# Patient Record
Sex: Female | Born: 1940
Health system: Southern US, Community
[De-identification: ages and names within clinical notes are randomized; demographics above are authoritative.]

## PROBLEM LIST (undated history)

## (undated) ENCOUNTER — Emergency Department (HOSPITAL_COMMUNITY): Admission: EM | Payer: Medicare Other | Source: Home / Self Care

## (undated) DIAGNOSIS — F329 Major depressive disorder, single episode, unspecified: Secondary | ICD-10-CM

## (undated) DIAGNOSIS — I10 Essential (primary) hypertension: Secondary | ICD-10-CM

## (undated) DIAGNOSIS — G4700 Insomnia, unspecified: Secondary | ICD-10-CM

## (undated) DIAGNOSIS — Z923 Personal history of irradiation: Secondary | ICD-10-CM

## (undated) DIAGNOSIS — E119 Type 2 diabetes mellitus without complications: Secondary | ICD-10-CM

## (undated) DIAGNOSIS — E785 Hyperlipidemia, unspecified: Secondary | ICD-10-CM

## (undated) DIAGNOSIS — K219 Gastro-esophageal reflux disease without esophagitis: Secondary | ICD-10-CM

## (undated) DIAGNOSIS — R519 Headache, unspecified: Secondary | ICD-10-CM

## (undated) DIAGNOSIS — I495 Sick sinus syndrome: Secondary | ICD-10-CM

## (undated) DIAGNOSIS — R55 Syncope and collapse: Secondary | ICD-10-CM

## (undated) DIAGNOSIS — Z950 Presence of cardiac pacemaker: Secondary | ICD-10-CM

## (undated) DIAGNOSIS — H18519 Endothelial corneal dystrophy, unspecified eye: Secondary | ICD-10-CM

## (undated) DIAGNOSIS — C50919 Malignant neoplasm of unspecified site of unspecified female breast: Secondary | ICD-10-CM

## (undated) DIAGNOSIS — G245 Blepharospasm: Secondary | ICD-10-CM

## (undated) DIAGNOSIS — Z720 Tobacco use: Secondary | ICD-10-CM

## (undated) DIAGNOSIS — D329 Benign neoplasm of meninges, unspecified: Secondary | ICD-10-CM

## (undated) DIAGNOSIS — H1851 Endothelial corneal dystrophy: Secondary | ICD-10-CM

## (undated) DIAGNOSIS — I619 Nontraumatic intracerebral hemorrhage, unspecified: Secondary | ICD-10-CM

## (undated) DIAGNOSIS — R0989 Other specified symptoms and signs involving the circulatory and respiratory systems: Secondary | ICD-10-CM

## (undated) DIAGNOSIS — F32A Depression, unspecified: Secondary | ICD-10-CM

## (undated) DIAGNOSIS — J449 Chronic obstructive pulmonary disease, unspecified: Secondary | ICD-10-CM

## (undated) DIAGNOSIS — F419 Anxiety disorder, unspecified: Secondary | ICD-10-CM

## (undated) HISTORY — PX: CHOLECYSTECTOMY: SHX55

## (undated) HISTORY — DX: Insomnia, unspecified: G47.00

## (undated) HISTORY — PX: PARTIAL HYSTERECTOMY: SHX80

## (undated) HISTORY — DX: Type 2 diabetes mellitus without complications: E11.9

## (undated) HISTORY — DX: Major depressive disorder, single episode, unspecified: F32.9

## (undated) HISTORY — PX: BREAST LUMPECTOMY: SHX2

## (undated) HISTORY — DX: Anxiety disorder, unspecified: F41.9

## (undated) HISTORY — DX: Other specified symptoms and signs involving the circulatory and respiratory systems: R09.89

## (undated) HISTORY — DX: Syncope and collapse: R55

## (undated) HISTORY — DX: Malignant neoplasm of unspecified site of unspecified female breast: C50.919

## (undated) HISTORY — DX: Chronic obstructive pulmonary disease, unspecified: J44.9

## (undated) HISTORY — DX: Depression, unspecified: F32.A

## (undated) HISTORY — DX: Gastro-esophageal reflux disease without esophagitis: K21.9

## (undated) HISTORY — DX: Hyperlipidemia, unspecified: E78.5

## (undated) HISTORY — DX: Tobacco use: Z72.0

## (undated) HISTORY — DX: Essential (primary) hypertension: I10

## (undated) HISTORY — DX: Blepharospasm: G24.5

## (undated) HISTORY — DX: Sick sinus syndrome: I49.5

## (undated) HISTORY — DX: Nontraumatic intracerebral hemorrhage, unspecified: I61.9

---

## 1985-11-13 DIAGNOSIS — C50919 Malignant neoplasm of unspecified site of unspecified female breast: Secondary | ICD-10-CM

## 1985-11-13 HISTORY — DX: Malignant neoplasm of unspecified site of unspecified female breast: C50.919

## 1995-05-03 LAB — HM COLONOSCOPY

## 2000-08-06 ENCOUNTER — Encounter: Admission: RE | Admit: 2000-08-06 | Discharge: 2000-08-06 | Payer: Self-pay | Admitting: Family Medicine

## 2000-08-06 ENCOUNTER — Encounter: Payer: Self-pay | Admitting: Family Medicine

## 2000-10-15 ENCOUNTER — Encounter: Payer: Self-pay | Admitting: Oncology

## 2000-10-15 ENCOUNTER — Encounter: Admission: RE | Admit: 2000-10-15 | Discharge: 2000-10-15 | Payer: Self-pay | Admitting: Oncology

## 2001-11-13 DIAGNOSIS — Z982 Presence of cerebrospinal fluid drainage device: Secondary | ICD-10-CM

## 2001-11-13 DIAGNOSIS — I619 Nontraumatic intracerebral hemorrhage, unspecified: Secondary | ICD-10-CM

## 2001-11-13 HISTORY — PX: ANEURYSM COILING: SHX5349

## 2001-11-13 HISTORY — DX: Nontraumatic intracerebral hemorrhage, unspecified: I61.9

## 2001-11-13 HISTORY — PX: VENTRICULOPERITONEAL SHUNT: SHX204

## 2001-11-13 HISTORY — DX: Presence of cerebrospinal fluid drainage device: Z98.2

## 2001-11-14 ENCOUNTER — Encounter: Payer: Self-pay | Admitting: Oncology

## 2001-11-14 ENCOUNTER — Encounter: Admission: RE | Admit: 2001-11-14 | Discharge: 2001-11-14 | Payer: Self-pay | Admitting: Oncology

## 2001-11-19 ENCOUNTER — Encounter: Admission: RE | Admit: 2001-11-19 | Discharge: 2001-11-19 | Payer: Self-pay | Admitting: Oncology

## 2001-11-19 ENCOUNTER — Encounter: Payer: Self-pay | Admitting: Oncology

## 2002-06-01 ENCOUNTER — Emergency Department (HOSPITAL_COMMUNITY): Admission: EM | Admit: 2002-06-01 | Discharge: 2002-06-01 | Payer: Self-pay | Admitting: Emergency Medicine

## 2002-06-01 ENCOUNTER — Inpatient Hospital Stay (HOSPITAL_COMMUNITY): Admission: AD | Admit: 2002-06-01 | Discharge: 2002-06-27 | Payer: Self-pay | Admitting: Neurosurgery

## 2002-06-01 DIAGNOSIS — I609 Nontraumatic subarachnoid hemorrhage, unspecified: Secondary | ICD-10-CM | POA: Insufficient documentation

## 2002-06-01 DIAGNOSIS — Z8679 Personal history of other diseases of the circulatory system: Secondary | ICD-10-CM

## 2002-06-02 ENCOUNTER — Encounter: Payer: Self-pay | Admitting: Neurosurgery

## 2002-06-04 ENCOUNTER — Encounter: Payer: Self-pay | Admitting: Critical Care Medicine

## 2002-06-05 ENCOUNTER — Encounter: Payer: Self-pay | Admitting: Critical Care Medicine

## 2002-06-08 ENCOUNTER — Encounter: Payer: Self-pay | Admitting: Pulmonary Disease

## 2002-06-09 ENCOUNTER — Encounter: Payer: Self-pay | Admitting: Neurosurgery

## 2002-06-09 ENCOUNTER — Encounter: Payer: Self-pay | Admitting: Pulmonary Disease

## 2002-06-10 ENCOUNTER — Encounter: Payer: Self-pay | Admitting: Pulmonary Disease

## 2002-06-10 ENCOUNTER — Encounter: Payer: Self-pay | Admitting: Neurosurgery

## 2002-06-12 ENCOUNTER — Encounter: Payer: Self-pay | Admitting: Pulmonary Disease

## 2002-06-12 ENCOUNTER — Encounter: Payer: Self-pay | Admitting: Neurosurgery

## 2002-06-17 ENCOUNTER — Encounter: Payer: Self-pay | Admitting: Neurosurgery

## 2002-06-27 ENCOUNTER — Inpatient Hospital Stay (HOSPITAL_COMMUNITY)
Admission: RE | Admit: 2002-06-27 | Discharge: 2002-07-10 | Payer: Self-pay | Admitting: Physical Medicine & Rehabilitation

## 2002-07-02 ENCOUNTER — Encounter: Payer: Self-pay | Admitting: Physical Medicine & Rehabilitation

## 2002-07-22 ENCOUNTER — Encounter
Admission: RE | Admit: 2002-07-22 | Discharge: 2002-07-22 | Payer: Self-pay | Admitting: Physical Medicine & Rehabilitation

## 2002-09-02 ENCOUNTER — Ambulatory Visit: Admission: RE | Admit: 2002-09-02 | Discharge: 2002-09-02 | Payer: Self-pay | Admitting: Interventional Radiology

## 2002-09-16 ENCOUNTER — Encounter: Payer: Self-pay | Admitting: Neurosurgery

## 2002-09-16 ENCOUNTER — Encounter: Admission: RE | Admit: 2002-09-16 | Discharge: 2002-09-16 | Payer: Self-pay | Admitting: Neurosurgery

## 2002-09-29 ENCOUNTER — Encounter: Payer: Self-pay | Admitting: Family Medicine

## 2002-09-29 ENCOUNTER — Encounter: Admission: RE | Admit: 2002-09-29 | Discharge: 2002-09-29 | Payer: Self-pay | Admitting: Family Medicine

## 2002-12-04 ENCOUNTER — Encounter: Payer: Self-pay | Admitting: Oncology

## 2002-12-04 ENCOUNTER — Encounter: Admission: RE | Admit: 2002-12-04 | Discharge: 2002-12-04 | Payer: Self-pay | Admitting: Oncology

## 2003-02-06 ENCOUNTER — Ambulatory Visit (HOSPITAL_COMMUNITY): Admission: RE | Admit: 2003-02-06 | Discharge: 2003-02-06 | Payer: Self-pay | Admitting: Interventional Radiology

## 2003-07-13 ENCOUNTER — Ambulatory Visit (HOSPITAL_COMMUNITY): Admission: RE | Admit: 2003-07-13 | Discharge: 2003-07-13 | Payer: Self-pay | Admitting: Interventional Radiology

## 2003-10-21 ENCOUNTER — Ambulatory Visit (HOSPITAL_COMMUNITY): Admission: RE | Admit: 2003-10-21 | Discharge: 2003-10-21 | Payer: Self-pay | Admitting: Interventional Radiology

## 2003-10-29 ENCOUNTER — Ambulatory Visit (HOSPITAL_COMMUNITY): Admission: RE | Admit: 2003-10-29 | Discharge: 2003-10-29 | Payer: Self-pay | Admitting: Interventional Radiology

## 2003-11-23 ENCOUNTER — Ambulatory Visit (HOSPITAL_COMMUNITY): Admission: RE | Admit: 2003-11-23 | Discharge: 2003-11-23 | Payer: Self-pay | Admitting: Interventional Radiology

## 2003-12-09 ENCOUNTER — Inpatient Hospital Stay (HOSPITAL_COMMUNITY): Admission: AD | Admit: 2003-12-09 | Discharge: 2003-12-10 | Payer: Self-pay | Admitting: Interventional Radiology

## 2004-01-21 ENCOUNTER — Encounter: Admission: RE | Admit: 2004-01-21 | Discharge: 2004-01-21 | Payer: Self-pay | Admitting: Oncology

## 2004-03-07 ENCOUNTER — Ambulatory Visit (HOSPITAL_COMMUNITY): Admission: RE | Admit: 2004-03-07 | Discharge: 2004-03-07 | Payer: Self-pay | Admitting: Interventional Radiology

## 2004-05-28 ENCOUNTER — Emergency Department (HOSPITAL_COMMUNITY): Admission: EM | Admit: 2004-05-28 | Discharge: 2004-05-28 | Payer: Self-pay | Admitting: *Deleted

## 2004-07-15 ENCOUNTER — Ambulatory Visit (HOSPITAL_COMMUNITY): Admission: RE | Admit: 2004-07-15 | Discharge: 2004-07-15 | Payer: Self-pay | Admitting: Interventional Radiology

## 2005-02-17 ENCOUNTER — Ambulatory Visit (HOSPITAL_COMMUNITY): Admission: RE | Admit: 2005-02-17 | Discharge: 2005-02-17 | Payer: Self-pay | Admitting: Interventional Radiology

## 2005-02-28 ENCOUNTER — Encounter: Admission: RE | Admit: 2005-02-28 | Discharge: 2005-02-28 | Payer: Self-pay | Admitting: Family Medicine

## 2005-04-07 ENCOUNTER — Ambulatory Visit (HOSPITAL_COMMUNITY): Admission: RE | Admit: 2005-04-07 | Discharge: 2005-04-07 | Payer: Self-pay | Admitting: Family Medicine

## 2005-11-03 ENCOUNTER — Ambulatory Visit (HOSPITAL_COMMUNITY): Admission: RE | Admit: 2005-11-03 | Discharge: 2005-11-03 | Payer: Self-pay | Admitting: Interventional Radiology

## 2006-02-27 ENCOUNTER — Ambulatory Visit (HOSPITAL_COMMUNITY): Admission: RE | Admit: 2006-02-27 | Discharge: 2006-02-27 | Payer: Self-pay | Admitting: General Surgery

## 2006-02-27 ENCOUNTER — Encounter (INDEPENDENT_AMBULATORY_CARE_PROVIDER_SITE_OTHER): Payer: Self-pay | Admitting: Specialist

## 2006-06-04 ENCOUNTER — Ambulatory Visit (HOSPITAL_COMMUNITY): Admission: RE | Admit: 2006-06-04 | Discharge: 2006-06-04 | Payer: Self-pay | Admitting: Interventional Radiology

## 2006-06-21 ENCOUNTER — Encounter: Admission: RE | Admit: 2006-06-21 | Discharge: 2006-06-21 | Payer: Self-pay | Admitting: Family Medicine

## 2006-07-12 ENCOUNTER — Observation Stay (HOSPITAL_COMMUNITY): Admission: AD | Admit: 2006-07-12 | Discharge: 2006-07-13 | Payer: Self-pay | Admitting: *Deleted

## 2006-07-12 HISTORY — PX: PACEMAKER INSERTION: SHX728

## 2006-08-05 ENCOUNTER — Emergency Department (HOSPITAL_COMMUNITY): Admission: EM | Admit: 2006-08-05 | Discharge: 2006-08-05 | Payer: Self-pay | Admitting: Emergency Medicine

## 2007-05-27 ENCOUNTER — Ambulatory Visit (HOSPITAL_COMMUNITY): Admission: RE | Admit: 2007-05-27 | Discharge: 2007-05-27 | Payer: Self-pay | Admitting: Neurosurgery

## 2007-07-05 ENCOUNTER — Encounter: Admission: RE | Admit: 2007-07-05 | Discharge: 2007-07-05 | Payer: Self-pay | Admitting: Family Medicine

## 2008-07-06 ENCOUNTER — Encounter: Admission: RE | Admit: 2008-07-06 | Discharge: 2008-07-06 | Payer: Self-pay | Admitting: *Deleted

## 2009-09-30 ENCOUNTER — Encounter: Admission: RE | Admit: 2009-09-30 | Discharge: 2009-09-30 | Payer: Self-pay | Admitting: Internal Medicine

## 2010-12-03 ENCOUNTER — Encounter: Payer: Self-pay | Admitting: Interventional Radiology

## 2010-12-03 ENCOUNTER — Encounter: Payer: Self-pay | Admitting: Neurosurgery

## 2010-12-04 ENCOUNTER — Encounter: Payer: Self-pay | Admitting: Family Medicine

## 2010-12-04 ENCOUNTER — Encounter: Payer: Self-pay | Admitting: *Deleted

## 2011-03-31 NOTE — Op Note (Signed)
Mary Higgins, HODGKINS            ACCOUNT NO.:  [id number]   MEDICAL RECORD NO.:  [id number]          PATIENT TYPE:  AMB   LOCATION:  DAY                          FACILITY:  [facility]   PHYSICIAN:  Timothy E. Earlene Plater, M.D. DATE OF BIRTH:  01-14-1941   DATE OF PROCEDURE:  02/27/2006  DATE OF DISCHARGE:                                 OPERATIVE REPORT   PREOPERATIVE DIAGNOSIS:  Mass, cyst, midback.   POSTOPERATIVE DIAGNOSIS:  Lipoma, midback.   OPERATIVE PROCEDURE:  Excision and drainage, mass.   SURGEON:  Timothy E. Earlene Plater, M.D.   ANESTHESIA:  Local standby.   [name] is 37, otherwise fairly healthy.  Medications and medical  situations known and understood.  She presented in the office February 22  with a mass that for all practical purposes was a hematoma of her midback.  She had fallen and struck a piece of furniture at home.  Also notable, she  has been worked up for dizzy spells and falling.  We did not treat that,  thinking it would resolve.  She came back to the office on March 26 and 50  mL of old blood was removed.  Again, the swelling and pain recurred and she  is insistent about having a complete excision, so we have arranged that.  She was seen and identified, the permit signed, evaluated by anesthesia.   She was taken to the operating room, where she transferred herself to the  table and positioned herself comfortably on her left side down.  Then IV  sedation was given.  The mass was identified just off the right midline of  the low midback.  I used 0.25% Marcaine and 1% Xylocaine for local  anesthesia.  I made a 1 cm incision dependently, thinking that fluid would  flow, and, in fact, there was no fluid.  I probed the wound cavity, and it  appeared to be fatty tissue.  I put in additional local and then made an  incision over the middle part of that mass and, indeed, it was a diffuse  lipoma.  I debrided, suctioned and removed all of the lipomatous material.  There  was no capsule.  Bleeding was controlled with the cautery.  I did  place a 19 mm Blake drain and brought it out through the dependent incision,  sutured it to the skin, cut it to length, and then closed the wound with  layers of 3-0 Monocryl.  She tolerated it well, counts correct.  Also to  note, cultures of the wound were made because of this long history.   She is given Vicodin for pain and will be seen in follow-up in the office.      Timothy E. Earlene Plater, M.D.  Electronically Signed    TED/MEDQ  D:  02/27/2006  T:  02/28/2006  Job:  [id number]   cc:   Knox Royalty, M.D.  [facility]

## 2011-03-31 NOTE — Op Note (Signed)
NAMEJORI, FRERICHS            ACCOUNT NO.:  [id number]   MEDICAL RECORD NO.:  [id number]          PATIENT TYPE:  OBV   LOCATION:  6529                         FACILITY:  [facility]   PHYSICIAN:  Darlin Priestly, MD  DATE OF BIRTH:  1941/05/17   DATE OF PROCEDURE:  07/12/2006  DATE OF DISCHARGE:                                 OPERATIVE REPORT   PROCEDURE:  Insertion of a St. Jude Victory XL-DR generator with passive  atrial and ventricular leads.   ATTENDING PHYSICIAN:  Darlin Priestly, MD   COMPLICATIONS:  None.   INDICATIONS:  The patient is a 70 year old female with a history of  diabetes, hyperlipidemia, tobacco use, COPD, history of recurrent syncope,  history of cerebral aneurysm, recently underwent intracerebral coiling by  Dr. Grandville Silos. Deveshwar, M.D. in 2003 as well as a placement of a  ventriculoperitoneal shunt by Dr. Dorian Heckle.  She underwent a tilt  table test this morning for possible workup of near cardiogenic syncope  which was positive dropping her blood pressure into the 60s and a heart rate  into the 20s.  She is now referred for dual chamber pacer implant secondary  to recurrent syncope and pronounced bradycardia during tilt-table testing.   PROCEDURE IN DETAIL:  After informed consent, the patient was brought to the  cardiac catheterization level and the level of the right chest was prepped  and draped in a sterile fashion.  EKG monitoring established.  Lidocaine 1%  was used to anesthetize the right mid subclavicular area.  Next, an  approximately 3 cm horizontal incision was carried out in the mid clavicle  and hemostasis was obtained with electrocautery.  Blunt dissection was used  to carry this down to the right pectoral fascia.  Next, an approximately 3 x  4 cm pocket was created over the right pectoral fascia and good hemostasis  obtained with electrocautery.  The right subclavian vein was then entered  using fluoro and contrast guidance and  guidewire was then easily passed into  the SVC and right atrium.  Following this, a #9 [place] dilator and sheath  __________ guidewire and guidewire was then removed.  Next, a St. Jude  passive lead, Model #[id number], 52 cm, Serial [id number] was then passed into the  right atrium.  Guidewire was retained and peel-away sheath was removed.  A 7-  French sheath was then inserted over the retained guidewire and the  guidewire and dilator were removed.  Following this, a 46 cm St. Jude  passive lead, Model [id number] was then passed into the right atrium and peel-  away sheath removed.   A J-curve was then placed in the ventricular lead stylette and the  ventricular lead was then allowed to prolapse the tricuspid valve and  positioned in the RV apex without difficulty.  Threshold was then  determined.  R-waves were measured at 14.5 millivolts.  Impedance was 1013  ohms.  Thresholds were 0.5 volts at 0.5 milliseconds.  The current was 0.3  milliamps.  The atrial lead was then allowed to form a J and positioned in  the right atrial  appendage.  Threshold was then determined.  P-waves were  measured at 5.7 millivolts, impedance 604 ohms.  Threshold in the atrium 0.4  volts at 0.5 milliseconds.  Current was 0.7 milliamps.  Both of these were  negative for diaphragmatic stimulation at 10 volts.   Two silk sutures were then placed at the base of each lead and each lead was  then secured into place with two silk sutures per lead.  The pocket was then  copiously irrigated with 1% Kenmycin solution.  Again hemostasis was  confirmed.  The leads were then connected in serial fashion to a St. Jude  [employer] generator, Model [id number], Serial [id number].  Head screws were  tightened and pacing was confirmed.  A single silk suture was then placed in  the apex of the pocket.  The generator leads were then delivered into the  pocket and the __________ was secured with a silk suture.  The subcutaneous  layer was closed  using running 2-0 Vicryl.  The skin layers were then closed  using 4-0 Vicryl.  Steri-Strips were then applied.  The patient returned to  the recovery room in stable condition.   CONCLUSION:  Successful implant of a St. Jude Victory XL-DR generator, Model  [id number], Serial [id number] with passive atrial and ventricular St. Jude leads.      Darlin Priestly, MD  Electronically Signed     RHM/MEDQ  D:  07/12/2006  T:  07/12/2006  Job:  [id number]   cc:   Gerlene Burdock A. Alanda Amass, M.D.  Armstead Peaks, M.D.  Danae Orleans. Venetia Maxon, M.D.  Sanjeev K. Corliss Skains, M.D.

## 2011-03-31 NOTE — Consult Note (Signed)
NAMECYTHNIA, Mary Higgins            ACCOUNT NO.:  [id number]   MEDICAL RECORD NO.:  [id number]          PATIENT TYPE:  OUT   LOCATION:  SDS                          FACILITY:  [facility]   PHYSICIAN:  Sanjeev K. Deveshwar, M.D.DATE OF BIRTH:  1941-07-26   DATE OF CONSULTATION:  11/03/2205  DATE OF DISCHARGE:                                   CONSULTATION   BRIEF HISTORY:  This is a very pleasant 70 year old female who was admitted  to [facility] by Dr. Venetia Maxon on July of 2003 with a subarachnoid  hemorrhage. The patient had a right frontal ventricular catheter placed on  September June 01, 2002. She also underwent coiling of the aneurysm by Dr.  Corliss Skains during that admission. The patient has been followed on a regular  basis with angiograms. Her most recent angiogram was performed in April of  2006. At that time an aneurysm appeared to be stable without evidence of  recannulization or coil compaction.   The patient was seen today by Dr. Corliss Skains for a recent history of falling  and headaches.   PAST MEDICAL HISTORY:  1.  Significant for diabetes mellitus.  2.  Hyperlipidemia.  3.  Breast CA.  4.  History of tobacco use.  5.  History of a 50% left internal carotid artery aneurysm at the time of      her last angiogram.  6.  She also has a history of anxiety and depression.   ALLERGIES:  DAYPRO (causes a rash).   PAST SURGICAL HISTORY:  The patient is status post cholecystectomy, status  post lumpectomy for breast CA, status post partial hysterectomy.   FAMILY HISTORY:  Her mother died at age 70 from cancer. She has two brothers  alive and well. Her father died at age 96 from natural causes.   SOCIAL HISTORY:  The patient is separated. She has three children. She lives  in [place]. She has a history of tobacco use. She does not use alcohol.  She is on disability.   IMPRESSION:  As noted, the patient is seen in consult today by Dr. Corliss Skains  secondary to recent  falls. At one point she did hit her head on concrete and  required several staples. She had an MRI that did not show any acute  changes. The patient's daughter has witnessed some of these falls. The  patient denies loss of consciousness. She denies bowel or bladder  incontinence. Following one episode she had some mild confusion. She notes  that she occasionally staggers when walking but on some occasions does not  fall.   Dr. Corliss Skains is concerned that possibly her shunt is not working. He  ordered a shunt series to be performed today the results of which are  currently pending. She may need further follow up with Dr. Venetia Maxon as well.  Greater than 30 minutes was spent on this consult today.      Delton See, P.A.    ______________________________  Grandville Silos. Corliss Skains, M.D.    DR/MEDQ  D:  11/03/2005  T:  11/03/2005  Job:  [id number]   cc:   Teena Irani.  Arlyce Dice, M.D.  Fax: [phone]   Danae Orleans. Venetia Maxon, M.D.  Fax: [phone]

## 2011-03-31 NOTE — Discharge Summary (Signed)
Mary Higgins, Mary Higgins            ACCOUNT NO.:  [id number]   MEDICAL RECORD NO.:  [id number]          PATIENT TYPE:  OBV   LOCATION:  6529                         FACILITY:  [facility]   PHYSICIAN:  Richard A. Alanda Amass, M.D.DATE OF BIRTH:  10/05/1941   DATE OF ADMISSION:  07/12/2006  DATE OF DISCHARGE:  07/13/2006                                 DISCHARGE SUMMARY   DISCHARGE DIAGNOSIS:  1. Neurocardiogenic syncope status post pacemaker implant with a St. Jude      device this admission by Dr. Jenne Campus.  2. History of cerebral aneurysm status post recent intracerebral coiling      by Dr. Corliss Skains as well as a shunt placement by Dr. Venetia Maxon in the past.  3. Dyslipidemia.  4. Chronic obstructive pulmonary disease.  5. Non-insulin-dependent diabetes.   HOSPITAL COURSE:  The patient is a 70 year old female with the above medical  history who has had recurrent syncope.  She underwent a tilt table test on  July 12, 2006, which was positive with hypotension and bradycardia with a  heart rate down in the 20s.  She underwent implantation of St. Jude Victory  generator with atrial and ventricular leads.  She tolerated this well.  Dr.  Alanda Amass feels she can be discharged July 13, 2006.  She will follow up  with Dr. Alanda Amass as an outpatient.  It should be noted her chest x-ray at  discharge was abnormal in that there was an increased area of density over  the medial right upper, this could have been a shadow.  Follow-up chest x-  ray or CT is recommended as an outpatient.  There is also a density in the  left lower lobe which could be atelectasis.  CT of her head was done this  admission also which revealed a stable mild ventricular dilatation status  post ventriculoperitoneal shunt with no acute intracranial findings.  Skull  x-ray a showed ventriculoperitoneal shunt and was intact with no fractures.   LABORATORY DATA:  Sodium 133, potassium 3.9, BUN 10, creatinine 0.6.   DISCHARGE  MEDICATIONS:  Toprol XL 12.5 mg a day, metformin 1 gram a day,  Lexapro 10 mg a day, Lipitor 20 mg a day, aspirin 81 mg a day, Ambien CR and  clonazepam as needed.   DISPOSITION:  The patient is discharged in stable condition.  She will  follow up with Dr. Alanda Amass next week for a wound check.  We will need to  follow up with chest x-ray and she may need a chest CT to follow up the  above noted abnormalities.     Abelino Derrick, P.A.      Richard A. Alanda Amass, M.D.  Electronically Signed   LKK/MEDQ  D:  07/13/2006  T:  07/13/2006  Job:  [id number]

## 2011-03-31 NOTE — Cardiovascular Report (Signed)
Mary Higgins, Mary Higgins            ACCOUNT NO.:  [id number]   MEDICAL RECORD NO.:  [id number]          PATIENT TYPE:  OBV   LOCATION:  6529                         FACILITY:  [facility]   PHYSICIAN:  Richard A. Alanda Amass, M.D.DATE OF BIRTH:  Mar 03, 1941   DATE OF PROCEDURE:  07/12/2006  DATE OF DISCHARGE:                              CARDIAC CATHETERIZATION   PROCEDURE:  Tilt table test.   BRIEF HISTORY:  Mary Higgins is a very pleasant 70 year old white separated  mother of 3.  She smokes 4-5 cigarettes a day, was a slightly heavier smoker  in the past.  She was initially sent to our office for evaluation of a small  PFO found on 2-D echo.  History revealed a one year history of recurrent  syncopal episodes that would happen with no warning and no recollection of  the fall, itself.  After the episode, she fell weak, there were no  palpitations.  She occasionally experienced some diaphoresis and nausea. No  vertigo and no headaches.  She walks with a cane due to staggering as the  family felt this might prevent further falls.  Historically, she has had  transient loss of consciousness with these and she has fallen backward  several times.   She has a remote history of cerebral aneurysm and subarachnoid hemorrhage.  The aneurysm was repaired with coiling by Dr. Corliss Skains in July 2003 and  apparently has been stable postoperatively on follow-up angiograms.  At the  time of her [facility], she had a ventriculoperitoneal shunt placed by Dr. Maeola Harman.  She has not been back to follow-up with him.  I have discussed this  with Dr. Venetia Maxon.  He did not feel that this would be the cause of her syncope  and he recommended a CT, MR shunt study which will be done today.  Recent  diagnosis of AODM, history of hyperlipidemia, remote history of breast  cancer status post lumpectomy, and 50% LICA on remote angiogram,  asymptomatic.   Echocardiogram showed moderate DUST with no outflow obstruction, normal  systolic function and EF, mild diastolic relaxation abnormality, mild left  atrial enlargement.  Very small PFO.   EKG showed sinus rhythm and was essentially within normal limits.   Preoperative laboratory showed normal CBC and diff, coags, TSH, BMP.   The tilt table test was ordered in this setting with strong clinical  suspicion of neurocardiogenic syncope.   The patient underwent upright tilt table testing in the postabsorptive state  this a.m. of July 12, 2006.  After approximately 29 minutes, systolic  blood pressure dropped to 90 then transiently to 75 and to 80 over the next  several minutes.  The patient was asymptomatic at that time.   The patient had continued low blood pressures over the next ten minutes  without symptoms from 57-78.  She then developed fairly profound bradycardia  in the 20s with sinus bradycardia.  The 70% tilt was immediately abandoned  and the patient was laid flat.  When I came in, the IVs were not open but I  opened those up.  The patient had some transient diaphoresis which  resolved  and some anxiety but was quite comfortable and her blood pressure promptly  responded with fluids over the next 5-10 minutes to 126/70 and a heart rate  of 70.  12-lead EKG is pending.   The upright tilt table test is positive for vasodepressor response and  profound hypotension with presyncope.  There was no loss of consciousness or  syncope during this test.   Further workup is warranted as it appears that this patient has a component  of both bradycardia and vasodepression that may account for or at least be  seriously contributing to her recurrent syncopal episodes.  After her MRI,  CT, and shunt studies are done and no significant abnormality, we would  recommend permanent pacemaker implantation in this setting.      Richard A. Alanda Amass, M.D.  Electronically Signed     RAW/MEDQ  D:  07/12/2006  T:  07/12/2006  Job:  [id number]   cc:   Sanjeev K.  Corliss Skains, M.D.  Armstead Peaks, M.D.  Danae Orleans. Venetia Maxon, M.D.

## 2011-03-31 NOTE — Consult Note (Signed)
[place]  Patient:    Mary Higgins, Mary Higgins Visit Number: [id number] MRN: [id number]          Service Type: EMS Location: ED Attending Physician:  Tobey Bride Dictated by:   Danae Orleans Venetia Maxon, M.D. Proc. Date: 06/01/02 Admit Date:  06/01/2002 Discharge Date: 06/01/2002                            Consultation Report  REASON FOR CONSULTATION:  Subarachnoid hemorrhage.  HISTORY OF ILLNESS:  The patient is a 70 year old right-handed woman with the most severe headache of her life approximately 2 p.m. today associated with nausea and vomiting.  She went to the [facility] Emergency Room where a head CT was obtained which showed subarachnoid hemorrhage with interhemispheric blood, a small amount of intraventricular blood with incipient hydrocephalus; she was transferred to [facility] for further care per Dr. Hilario Quarry.  PAST MEDICAL HISTORY:  Past medical history is significant for breast cancer and depression.  She is status post prior left breast lumpectomy with lymph node dissection and radiation therapy.  She is followed by Dr. Raymond Gurney C. Magrinat and has been cleared for breast cancer recurrence; she has been under his care for the last six years.  ALLERGIES:  No known drug allergies.  MEDICATIONS: 1. Klonopin 1 mg t.i.d. 2. Wellbutrin 150 mg daily.  PHYSICAL EXAMINATION:  VITAL SIGNS:  The patient has a temperature of 97.5, pulse of 70, respiratory rate of 12, blood pressure of 150/70.  NEUROLOGIC:  She is somnolent but arousable.  She has photophobia, mild meningismus.  Her pupils are 2 mm and reactive.  Extraocular movements are intact.  Face is intact and symmetric.  Hearing is intact to finger rub. Palate is upgoing.  Shoulder shrug is symmetric.  Tongue protrudes in the midline.  She has no pronator drift.  She has 5/5 upper and lower extremity strength in all motor groups, bilaterally symmetric.  She has  intact sensation to light touch in her upper and lower extremities.  Deep tendon reflexes are 2 in the biceps, triceps and brachioradialis, 2 at the knees and 2 at the ankles.  Toes are downgoing to plantar stimulation.  IMPRESSION:  The patient is a 70 year old woman with a history of breast cancer and depression, with severe sudden onset of headache this afternoon at approximately 2 p.m.  Her head CT is consistent with a subarachnoid hemorrhage secondary to anterior communicating artery aneurysm; this would be grade 2 on S scale.  RECOMMENDATIONS:  Cerebral angiography to rule out aneurysm with endovascular versus surgical obliteration, depending on the aneurysm anatomy.  She is to have neuro checks every hour, blood pressure to be controlled.  She is to be observed for hydrocephalus with possible intraventricular catheter placement. She is placed on subarachnoid precautions. Dictated by:   Danae Orleans Venetia Maxon, M.D. Attending Physician:  Tobey Bride DD:  06/01/02 TD:  06/05/02 Job: [id number]

## 2011-03-31 NOTE — Op Note (Signed)
[place]  Patient:    Mary Higgins, Mary Higgins Visit Number: [id number] MRN: [id number]          Service Type: EMS Location: ED Attending Physician:  Tobey Bride Dictated by:   Danae Orleans Venetia Maxon, M.D. Admit Date:  06/01/2002 Discharge Date: 06/01/2002                             Operative Report  PREOPERATIVE DIAGNOSIS:  Subarachnoid hemorrhage, hydrocephalus, and anterior communicating artery aneurysm.  POSTOPERATIVE DIAGNOSIS:  Subarachnoid hemorrhage, hydrocephalus, and anterior communicating artery aneurysm.  PROCEDURE:  Right frontal ventricular catheter placement.  SURGEON:  Danae Orleans. Venetia Maxon, M.D.  ANESTHESIA:  Local lidocaine with IV sedation.  COMPLICATIONS:  None.  DISPOSITION:  To neuro-ICU.  INDICATIONS:  The patient is a 70 year old woman with ruptured anterior communicating artery aneurysm with had endovascular obliteration of the aneurysm, but had progressive hydrocephalus on repeat CT scan.  It was therefore elected to place an intraventricular catheter.  DESCRIPTION OF PROCEDURE:  With the patient in the neuro-ICU, she was on a ventilator, and given intravenous sedation with morphine and Versed.  Her right frontal scalp was then prepped and draped in the usual sterile fashion. The planned incision was infiltrated with local lidocaine with 1:200,000 epinephrine.  A stab incision was made in the right frontal region just anterior to the coronal suture and 2 cm off the midline.  Using the twist drill, a small bur hole was created.  The dura was then incised with a spinal needle.  The ventricular catheter was passed with good brisk return of CSF at a depth of 7 cm which was under pressure.  The catheter was then tunneled through subgaleally through a separate stab exit wound using a trocar.  It was then anchored in position.  The incisions were sutured with nylon stitches. The drain was then hooked up.  The wound was dressed  with Benzoin gauze and Op-Site.  The patient tolerated the procedure well. Dictated by:   Danae Orleans Venetia Maxon, M.D. Attending Physician:  Tobey Bride DD:  06/02/02 TD:  06/04/02 Job: [id number]

## 2011-03-31 NOTE — Consult Note (Signed)
[facility]  Patient:    Mary Higgins, Mary Higgins Visit Number: [id number] MRN: [id number]          Service Type: EMS Location: ED Attending Physician:  Tobey Bride Dictated by:   Charlcie Cradle Delford Field, M.D. [facility] Proc. Date: 06/04/02 Admit Date:  06/01/2002 Discharge Date: 06/01/2002   CC:         Jomarie Longs D. Venetia Maxon, M.D.   Consultation Report  CHIEF COMPLAINT:  Vasospasm, altered mental status, need for central line.  HISTORY OF PRESENT ILLNESS:  This is a 70 year old white female who had a sudden onset of headache admitted on June 01, 2002 with an anterior communicating artery aneurysm with grade 4 subarachnoid hemorrhage.  She work up coiling of the aneurysm on June 01, 2002 by interventional radiology.  Has an IVC drain in place.  Postoperatively the patient did well until today when she developed increased agitation, alterations in consciousness, increased nausea, some emesis.  Transcranial Dopplers show increased velocity compatible with vasospasm.  We are asked to place a central line and help with [facility] therapy and other ICU issues.  PAST MEDICAL HISTORY:  History of depression.  Breast cancer, status post mastectomy on the left.  No known lung disease.  ALLERGIES:  None.  CURRENT MEDICATIONS:  Klonopin, Wellbutrin, Dilantin, Nimotop, Ancef.  FAMILY HISTORY:  Noncontributory.  SOCIAL HISTORY:  Noncontributory.  REVIEW OF SYSTEMS:  Noncontributory.  PHYSICAL EXAMINATION  VITAL SIGNS:  Temperature 100.8, blood pressure 155/70, pulse 85, respirations 13, saturation 95% on 2 L.  CHEST:  Clear.  CARDIAC:  Regular rate and rhythm with S3.  ABDOMEN:  Soft, nontender.  Bowel sounds active.  EXTREMITIES:  No edema or clubbing.  NEUROLOGIC:  The patient is lethargic, but will awaken to commands.  Moves the right side more than the left side.  HEENT:  No jugular venous distention, lymphadenopathy.  Oropharynx clear.  LABORATORY DATA:   Sodium 143, potassium 3.8, chloride 112, CO2 26, BUN 3, creatinine 0.7, calcium 8.4.  White count 8.6, hemoglobin 10.8.  On 2 L pH 7.37, pCO2 43, pO2 114.  Chest x-ray is pending.  EKG:  Normal sinus rhythm.  Normal ECG.  IMPRESSION AND RECOMMENDATION:  Subarachnoid hemorrhage grade IV with anterior communicating artery aneurysm status post coiling now with early vasospasm and alterations in consciousness.  Will place central line, give intravenous fluid bolus, keep systolic pressure greater or equal to 160 and CVP greater or equal to 10.  Monitor CVP.  Monitor gas exchange, chest x-ray, laboratory work.  No need for mechanical ventilation as of yet. Dictated by:   Charlcie Cradle Delford Field, M.D. [facility] Attending Physician:  Tobey Bride DD:  06/04/02 TD:  06/08/02 Job: [id number]

## 2011-03-31 NOTE — Op Note (Signed)
NAME:  Mary Higgins, PICKERAL                      ACCOUNT NO.:  [id number]   MEDICAL RECORD NO.:  [id number]                   PATIENT TYPE:  INP   LOCATION:  3106                                 FACILITY:  [facility]   PHYSICIAN:  Danae Orleans. Venetia Maxon, M.D.               DATE OF BIRTH:  1941/04/28   DATE OF PROCEDURE:  06/24/2002  DATE OF DISCHARGE:                                 OPERATIVE REPORT   PREOPERATIVE DIAGNOSES:  Hydrocephalus secondary to subarachnoid hemorrhage  secondary to anterior communicating artery aneurysm rupture.   POSTOPERATIVE DIAGNOSES:  Hydrocephalus secondary to subarachnoid hemorrhage  secondary to anterior communicating artery aneurysm rupture.   PROCEDURE:  Creation of ventriculoperitoneal shunt (1.5 level Delta).   SURGEON:  Danae Orleans. Venetia Maxon, M.D.   ASSISTANT:  Stefani Dama, M.D.   ANESTHESIA:  General endotracheal anesthesia.   ESTIMATED BLOOD LOSS:  Minimal.   COMPLICATIONS:  None.   DISPOSITION:  To recovery.   INDICATIONS:  The patient is a 70 year old woman with a ventricular drain  for three weeks, status post subarachnoid hemorrhage secondary to anterior  communicating artery aneurysm.  It is not possible to wean and remove the  ventricular catheter, and it is elected to place a ventriculoperitoneal  shunt.   DESCRIPTION OF PROCEDURE:  The patient was brought to the operating room.  Following the satisfactory and uncomplicated induction of general  endotracheal anesthesia and placement of intravenous lines, the patient was  placed in the supine position on the operating table.  The left side of her  scalp was then shaved, prepped, and draped in the usual sterile fashion  along with her left upper chest and abdomen.  The area of planned incision  was infiltrated with 0.25% Marcaine and 0.5% lidocaine and 1:200,000  epinephrine.  A curvilinear incision was made over the scalp of the left  frontal region, and the bur hole was created.   Subsequently the dura was  incised with electrocautery and the ventricular catheter was passed into the  left frontal horn of the ventricle.  Prior to doing this, an abdominal  incision was made and the peritoneal cavity was entered.  A shunt passer was  then used to create a subcutaneous tunnel to the abdomen.  A 1.5 snap-  assembly Delta shunt was then assembled and passed into position.  The 6 cm  snap-assembly ventricular catheter was inserted and assembled too with the  shunt after there was excellent spontaneous flow of CSF after the initial  time.  The entire assembly was then hooked together, and there was  spontaneous flow of CSF through the shunt system.  The peritoneal catheter  was then inserted into the abdomen and that incision was closed with  interrupted Vicryl suture.  The postauricular and cranial incisions were  then closed  with Vicryl stitches.  Sterile occlusive dressings were placed.  The right  frontoventricular catheter was then subsequently removed and a  stitch was  placed at its exit point.  The patient appeared to tolerate the procedure  well, was taken to recovery in stable and satisfactory condition.  All  counts were correct at the end of the procedure.                                               Danae Orleans. Venetia Maxon, M.D.    JDS/MEDQ  D:  06/24/2002  T:  06/26/2002  Job:  [id number]

## 2011-03-31 NOTE — Discharge Summary (Signed)
   NAME:  Mary Higgins, Mary Higgins                      ACCOUNT NO.:  [id number]   MEDICAL RECORD NO.:  [id number]                   PATIENT TYPE:  INP   LOCATION:  3106                                 FACILITY:  [facility]   PHYSICIAN:  Danae Orleans. Venetia Maxon, M.D.               DATE OF BIRTH:  Dec 06, 1940   DATE OF ADMISSION:  06/01/2002  DATE OF DISCHARGE:  06/27/2002                                 DISCHARGE SUMMARY   REASON FOR ADMISSION:  Subarachnoid hemorrhage, obstructive hydrocephalus,  hypo-osmolality, paralytic ileus, history of breast malignancy, history of  depression, hypopotassemia, transient cerebral ischemia.   HISTORY OF ILLNESS AND HOSPITAL COURSE:  The patient was admitted to the  hospital through the emergency room on June 01, 2002 after the most severe  headache of her life associated with nausea and vomiting.  Head CT was  positive for subarachnoid hemorrhage with interhemispheric blood and a small  amount of intraventricular hemorrhage.  The patient was admitted to the  hospital with subarachnoid hemorrhage precautions.  She had a four-vessel  cerebral angiogram and was found to have an anterior communicating artery  aneurysm.  The patient had endovascular obliteration of this aneurysm.  She  also had a ventricular drain placed for obstructive hydrocephalus.  She did  well with the aneurysm treatment and continued on with ventricular drainage.  She was seen by critical care medicine service and was kept hydrated with  evidence of vasospasm.  The patient had followup transcranial Dopplers.  She  subsequently required intraventricular peritoneal shunt placement and this  was done on June 24, 2002.  She did well with this after the shunt  placement, was doing well and was subsequently transferred to the inpatient  rehabilitation service for further rehabilitation in the hospital.  This was  done on June 27, 2002.                                               Danae Orleans. Venetia Maxon,  M.D.    JDS/MEDQ  D:  10/17/2002  T:  10/18/2002  Job:  [id number]

## 2011-03-31 NOTE — Discharge Summary (Signed)
NAME:  Mary, Higgins                      ACCOUNT NO.:  [id number]   MEDICAL RECORD NO.:  [id number]                   PATIENT TYPE:  IPS   LOCATION:  4006                                 FACILITY:  [facility]   PHYSICIAN:  Ranelle Oyster, M.D.             DATE OF BIRTH:  1940/11/20   DATE OF ADMISSION:  DATE OF DISCHARGE:  07/10/2002                                 DISCHARGE SUMMARY   DISCHARGE DIAGNOSES:  1. Subarachnoid hemorrhage secondary to anterior communicating artery     aneurysm.   1. Diabetes mellitus, diet-controlled.   1. History of depression.   1. Post hemorrhagic anemia.   HISTORY OF PRESENT ILLNESS:  Mary Higgins is a 70 year old female  with a history of breast cancer, depression, admitted to [facility]  on 07/20 with severe headache.  X-rays done revealed subarachnoid hemorrhage  secondary to ACA aneurysm.  She  underwent cerebral angiography with coiling  the same day.  She has had issues of vasospasms as well as fluctuation of  mental status.  VP shunt placed on 08/12 by Dr. Elenore Paddy with improvement in  mentation.  She has been placed on Dilantin for seizure prophylaxis.  Foley  DCd and physical therapy initiated.  The patient continues currently to have  periods of lethargy but improved overall.  She is currently total assist, 60-  70 percent for transfers, able to take pivoting steps.  Decreasing  coordination of left lower extremity noted.  She is able to follow one-step  commands with moderate cues.   PAST MEDICAL HISTORY:  Significant for breast cancer, status post left  partial mastectomy with chemo and radiation therapy.  History of depression,  hyperglycemia diet-controlled, visual problems secondary to XRT and  insomnia.   ALLERGIES:  NO KNOWN DRUG ALLERGIES.   SOCIAL HISTORY:  The patient is married and was managing a day-care prior to  admission.  She lives in a two-level home.  Does have a bedroom on the first  level.   She does not use any alcohol.  Quit tobacco 3 months ago.   HOSPITAL COURSE:  Ms. Lainee Lehrman was admitted to rehab on 06/27/02 for  inpatient therapy to consist of PT, OT daily.  Past admission she has been  maintained on Dilantin for seizure prophylaxis.  Dilantin level checked post  admission shows this to be a therapeutic level at 2.6; however, the patient  has been seizure free.  Blood sugars were monitored with h.s. CBG checks,  and have shown good control on diet alone ranging in 130s-140s range, 100  percent p.o. intake.  Blood pressures have been reasonably controlled  without any meds.  Secondary to patient's distractibility she was started on  low dose Ritalin with improvement in attention. By the time of discharge  attention had improved and patient with some complaints of agitation, she  had been weaned off Ritalin.  Mood has been stable on Welbutrin.  On the  p.m. of 07/20 the patient was found on the floor without recall of how she  had gotten there.  Followup CT was done on 08/20 which showed no change in  the degree of ventricular dilatation, no acute intracranial abnormality.   Other labs checked during this stay have been CBC showing hemoglobin 11.0,  hematocrit 32.7, white count 7.1,  platelets 197,000.  Sodium 137, potassium  3.4, chloride 102, CO2 29, BUN 8, creatinine 0.5, glucose 135,  AST 16, ALT  46, alkaline phos 73, hemoglobin A1c was within normal limits at 5.9.  Her  hypokalemia was treated with 24 hour supplementation of Kay Ciel.   Neuro/psych evaluation was done by Dr. Leonides Cave.  He felt patient with severe  deficit for sustained attention, verbal initiation, processing, speed,  memory and awareness.  Cognitive status at that time precludes that  evaluation promotes to doubt any depressive symptoms significantly  contributing to her disability.  He has been following patient along during  her stay.  By the time of discharge the patient has shown  significant  improvement in cognitive and communicative skills with improvement in  attention, awareness or memory, organization, and problem solving skills.  Residual deficits remain in working memory and in Designer, fashion/clothing.  At the time of discharge the patient's basic expression and comprehension  are intact.  She is able to follow 3-step command without difficulty.  In  terms of comprehension and inferential information she requires min assist.  Consistent with this high level expression required min assist secondary to  decrease in recall and attention.  Speech is clear without signs of apraxia  or dysarthria.  In terms of self-care she is modified independent to upper  body care.  Requires supervision for low body care as well as dressing of  upper and lower body.  She  is at supervision toileting.  She is currently  supervision for transfers.  Supervision ambulating 150 feet without  assistive device.  She will continue to require 24 hour supervision past  discharge.  The patient's husband is to provide this.  The patient will also  continue to receive followup outpatient PT, OT, speech therapy at [facility] to begin 07/22/02 post discharge.  On 07/10/02 the patient  is discharged to home.   DISCHARGE MEDICATIONS:  Welbutrin SR 100 mg b.i.d., Trinsicon 1 p.o. b.i.d.,  Dilantin 100 mg t.i.d., Ambien 5 mg q.h.s. p.r.n. no sleep, Senokot-S two  p.o. q.h.s.   DISCHARGE ACTIVITIES:  24 hour supervision.   DISCHARGE DIET:  Diabetic diet.   WOUND CARE:  Keep area clean and dry.   DISCHARGE INSTRUCTIONS:  No alcohol, no smoking, no driving, no aspirin and  no aspirin containing products.  Do not use Klonopin currently.   FOLLOW UP:  The patient is to followup with Dr. Elenore Paddy in 2-3 weeks.  Followup with Dr. Arlyce Dice in an appointment in 2-3 weeks.  Followup with Dr.  Riley Kill on 08/12/02 at 11 a.m.    Dian Situ, P.A.          Ranelle Oyster, M.D.    PP/MEDQ  D:  07/10/2002  T:  07/14/2002  Job:  [id number]   cc:   Dr. Elenore Paddy and Dr. Rudell Cobb [facility]

## 2011-03-31 NOTE — H&P (Signed)
Mary Higgins, Mary Higgins            ACCOUNT NO.:  [id number]   MEDICAL RECORD NO.:  [id number]          PATIENT TYPE:  OIB   LOCATION:  2899                         FACILITY:  [facility]   PHYSICIAN:  Sanjeev K. Deveshwar, M.D.DATE OF BIRTH:  1941/11/12   DATE OF ADMISSION:  02/17/2005  DATE OF DISCHARGE:                                HISTORY & PHYSICAL   CHIEF COMPLAINT:  Patient returns for repeat cerebral angiogram.   HISTORY OF PRESENT ILLNESS:  This is a pleasant 70 year old female who was  found to have a cerebral aneurysm in July of 2003.  At that time she  underwent coiling performed by Dr. Corliss Skains.  She has had repeat angiograms  over the years to follow the condition of her aneurysm.  This was an  anterior communicating artery aneurysm.  She was also noted to have a 50%  stenosis of the left internal carotid artery at the bulb on previous  angiograms.  She presents today for follow-up.  She denies any recent change  in symptomatology.  Please see review of systems as noted below.   PAST MEDICAL HISTORY:  1.  The patient does have a history of diabetes mellitus.  She is on      Metformin.  2.  She has hyperlipidemia.  3.  She has ongoing tobacco use.  She has smoked for over 30 years.  4.  She denies COPD.  She denies ever having had a stroke.  5.  She does have the above noted aneurysm.  6.  She has a history of breast CA and she is status post lumpectomy.  7.  She denies coronary disease.  8.  She does have a history of anxiety and depression.   ALLERGIES:  DAYPRO causes a rash.   CURRENT MEDICATIONS:  1.  Lexapro 20 mg daily.  2.  Metformin 500 mg b.i.d.  3.  Clonazepam 1 mg t.i.d.  4.  Zetia 10 mg daily.   She has not taken any of her medications today.   PAST SURGICAL HISTORY:  1.  She is status post cholecystectomy.  2.  She has a history of breast CA and is status post lumpectomy.  3.  She is status post a partial hysterectomy.   REVIEW OF SYSTEMS:  The  patient has had occasional chest pain approximately  one month or so ago which she ascribes to indigestion.  She has dyspnea on  exertion.  She denies cough or wheezing.  She has had some headaches and  occasional blurred vision.  She has occasional diarrhea.  She has arthritis  in her shoulders.  She denies any bleeding problems.  She has diabetes as  noted.  She denies thyroid problems.  She states that her gait is somewhat  unsteady.  She has occasional tingling and numbness in her arms as well as  her feet.   FAMILY HISTORY:  Her mother died age 38 from cancer.  She has two brothers  alive and well.  Her father died at age 29 from natural causes.   SOCIAL HISTORY:  The patient is separated.  She has three children.  She  lives in [place].  She smokes two to three cigarettes per day.  She does  not use alcohol.  She is on disability.   PHYSICAL EXAMINATION:  GENERAL:  Pleasant 70 year old white female seated in  a wheelchair.  VITAL SIGNS:  Blood pressure 102/72, pulse 78, respirations 12, temperature  97, oxygen saturation 99%.  HEENT:  Unremarkable.  NECK:  No bruits.  No jugular venous distention.  HEART:  Regular rate and rhythm with somewhat distant heart sounds.  LUNGS:  Clear.  ABDOMEN:  Obese, soft, nontender.  EXTREMITIES:  No significant edema.  Pulses are weak to absent.  NEUROLOGIC:  Grossly intact.  Mental status reveals the patient to be alert  and oriented and answering questions appropriately.  Cranial nerves II-XII  are intact.  Motor strength is 5/5 throughout.  Reflexes are normal and  intact.  Cerebellar testing is intact.  Brief sensory testing is intact.   IMPRESSION:  1.  Status post coiling of an anterior communicating artery aneurysm      performed in July of 2003.  2.  History of 50% left internal carotid artery stenosis on previous      angiograms.  3.  Diabetes mellitus.  4.  Hyperlipidemia.  5.  Ongoing tobacco use.  6.  History of breast  carcinoma status post lumpectomy.  7.  History of anxiety and depression.  8.  Occasional chest pain and dyspnea on exertion of uncertain etiology.  9.  Status post multiple surgeries as noted above.   PLAN:  The patient is here for cerebral angiogram today to further evaluate  her cerebrovascular disease.  Risks and benefits have been discussed with  the patient including bleeding, infection, MI, stroke, and death.  The  patient agrees to proceed.  The angiogram will be performed by Dr.  Corliss Skains.      DR/MEDQ  D:  02/17/2005  T:  02/17/2005  Job:  [id number]   cc:   Teena Irani. Arlyce Dice, M.D.  [address]  [place]  [place] [address]  Fax: [phone]   Danae Orleans. Venetia Maxon, M.D.  [address]  [place]  [place] [address]  Fax: [phone]

## 2012-01-29 ENCOUNTER — Other Ambulatory Visit: Payer: Self-pay | Admitting: Cardiovascular Disease

## 2012-01-29 DIAGNOSIS — Z1231 Encounter for screening mammogram for malignant neoplasm of breast: Secondary | ICD-10-CM

## 2012-02-07 ENCOUNTER — Ambulatory Visit
Admission: RE | Admit: 2012-02-07 | Discharge: 2012-02-07 | Disposition: A | Discharge status: Home / Self Care | Payer: Medicare Other | Source: Ambulatory Visit | Attending: Cardiovascular Disease | Admitting: Cardiovascular Disease

## 2012-02-07 DIAGNOSIS — Z1231 Encounter for screening mammogram for malignant neoplasm of breast: Secondary | ICD-10-CM

## 2013-03-18 ENCOUNTER — Other Ambulatory Visit (HOSPITAL_COMMUNITY): Payer: Self-pay | Admitting: Cardiovascular Disease

## 2013-03-18 DIAGNOSIS — R0989 Other specified symptoms and signs involving the circulatory and respiratory systems: Secondary | ICD-10-CM

## 2013-03-18 DIAGNOSIS — R011 Cardiac murmur, unspecified: Secondary | ICD-10-CM

## 2013-03-31 ENCOUNTER — Ambulatory Visit (HOSPITAL_COMMUNITY)
Admission: RE | Admit: 2013-03-31 | Discharge: 2013-03-31 | Disposition: A | Discharge status: Home / Self Care | Payer: Medicare Other | Source: Ambulatory Visit | Attending: Cardiovascular Disease | Admitting: Cardiovascular Disease

## 2013-03-31 ENCOUNTER — Other Ambulatory Visit: Payer: Self-pay | Admitting: Cardiovascular Disease

## 2013-03-31 DIAGNOSIS — I517 Cardiomegaly: Secondary | ICD-10-CM | POA: Insufficient documentation

## 2013-03-31 DIAGNOSIS — R011 Cardiac murmur, unspecified: Secondary | ICD-10-CM | POA: Insufficient documentation

## 2013-03-31 DIAGNOSIS — R0989 Other specified symptoms and signs involving the circulatory and respiratory systems: Secondary | ICD-10-CM

## 2013-03-31 DIAGNOSIS — E785 Hyperlipidemia, unspecified: Secondary | ICD-10-CM | POA: Insufficient documentation

## 2013-03-31 DIAGNOSIS — J449 Chronic obstructive pulmonary disease, unspecified: Secondary | ICD-10-CM | POA: Insufficient documentation

## 2013-03-31 DIAGNOSIS — I359 Nonrheumatic aortic valve disorder, unspecified: Secondary | ICD-10-CM | POA: Insufficient documentation

## 2013-03-31 DIAGNOSIS — I059 Rheumatic mitral valve disease, unspecified: Secondary | ICD-10-CM | POA: Insufficient documentation

## 2013-03-31 DIAGNOSIS — J4489 Other specified chronic obstructive pulmonary disease: Secondary | ICD-10-CM | POA: Insufficient documentation

## 2013-03-31 LAB — CBC WITH DIFFERENTIAL/PLATELET
Basophils Absolute: 0 10*3/uL (ref 0.0–0.1)
Basophils Relative: 0 % (ref 0–1)
Eosinophils Absolute: 0.1 10*3/uL (ref 0.0–0.7)
Eosinophils Relative: 1 % (ref 0–5)
HCT: 40.2 % (ref 36.0–46.0)
Hemoglobin: 13.8 g/dL (ref 12.0–15.0)
Lymphocytes Relative: 24 % (ref 12–46)
Lymphs Abs: 2.2 10*3/uL (ref 0.7–4.0)
MCH: 28 pg (ref 26.0–34.0)
MCHC: 34.3 g/dL (ref 30.0–36.0)
MCV: 81.7 fL (ref 78.0–100.0)
Monocytes Absolute: 0.4 10*3/uL (ref 0.1–1.0)
Monocytes Relative: 4 % (ref 3–12)
Neutro Abs: 6.7 10*3/uL (ref 1.7–7.7)
Neutrophils Relative %: 71 % (ref 43–77)
Platelets: 300 10*3/uL (ref 150–400)
RBC: 4.92 MIL/uL (ref 3.87–5.11)
RDW: 14.1 % (ref 11.5–15.5)
WBC: 9.4 10*3/uL (ref 4.0–10.5)

## 2013-03-31 LAB — LIPID PANEL
Cholesterol: 220 mg/dL — ABNORMAL HIGH (ref 0–200)
HDL: 38 mg/dL — ABNORMAL LOW (ref >39)
LDL Cholesterol: 159 mg/dL — ABNORMAL HIGH (ref 0–99)
Total CHOL/HDL Ratio: 5.8 Ratio
Triglycerides: 116 mg/dL (ref <150)
VLDL: 23 mg/dL (ref 0–40)

## 2013-03-31 LAB — COMPREHENSIVE METABOLIC PANEL
ALT: <8 U/L (ref 0–35)
AST: 11 U/L (ref 0–37)
Albumin: 4.2 g/dL (ref 3.5–5.2)
Alkaline Phosphatase: 63 U/L (ref 39–117)
BUN: 8 mg/dL (ref 6–23)
CO2: 30 mEq/L (ref 19–32)
Calcium: 9.6 mg/dL (ref 8.4–10.5)
Chloride: 99 mEq/L (ref 96–112)
Creat: 0.63 mg/dL (ref 0.50–1.10)
Glucose, Bld: 106 mg/dL — ABNORMAL HIGH (ref 70–99)
Potassium: 5.2 mEq/L (ref 3.5–5.3)
Sodium: 134 mEq/L — ABNORMAL LOW (ref 135–145)
Total Bilirubin: 0.8 mg/dL (ref 0.3–1.2)
Total Protein: 6.6 g/dL (ref 6.0–8.3)

## 2013-03-31 NOTE — Progress Notes (Signed)
Carotid artery duplex doppler was completed. Tida Saner RVT 

## 2013-03-31 NOTE — Progress Notes (Signed)
2D Echo Performed 03/31/2013    Zully Frane, RCS  

## 2013-04-01 LAB — HEMOGLOBIN A1C
Hgb A1c MFr Bld: 5.8 % — ABNORMAL HIGH (ref <5.7)
Mean Plasma Glucose: 120 mg/dL — ABNORMAL HIGH (ref <117)

## 2013-04-01 LAB — T4, FREE: Free T4: 1.37 ng/dL (ref 0.80–1.80)

## 2013-04-01 LAB — TSH: TSH: 1.307 u[IU]/mL (ref 0.350–4.500)

## 2013-04-14 ENCOUNTER — Other Ambulatory Visit: Payer: Self-pay | Admitting: *Deleted

## 2013-04-14 MED ORDER — METFORMIN HCL 500 MG PO TABS: 500.0000 mg | ORAL_TABLET | Freq: Two times a day (BID) | ORAL | Status: DC

## 2013-04-14 NOTE — Telephone Encounter (Signed)
RX REFILL FOR METFORMIN PRINTED FOR RAW

## 2013-04-22 ENCOUNTER — Telehealth: Payer: Self-pay | Admitting: Cardiovascular Disease

## 2013-04-22 NOTE — Telephone Encounter (Signed)
Pt misplaced her one of her prescriptions. Please call her son

## 2013-04-22 NOTE — Telephone Encounter (Signed)
Returned call. Left message to call back.

## 2013-04-22 NOTE — Telephone Encounter (Signed)
Last Rx written was for sertaline 50mg  one PO daily #30 w/ 11 refills on 5.5.14.  Call to pharmacy and informed Rx last filled and picked up on 6.3.14.  Clonazepam was also picked up on 6.1.14. Dr. Alanda Amass will be notified and pt will have to wait until he returns to the office on 6.12.14 for a decision.    Message forwarded to [facility]. Berlinda Last, LPN to discuss w/ Dr. Alanda Amass.  This note and paper chart# placed on Dr. Kandis Cocking cart.

## 2013-05-01 NOTE — Telephone Encounter (Signed)
Pt. Called x2 no answer and no response from first call message left to call pcp for any further advice on these medications.

## 2013-07-04 ENCOUNTER — Other Ambulatory Visit: Payer: Self-pay | Admitting: *Deleted

## 2013-07-04 MED ORDER — MIDODRINE HCL 5 MG PO TABS: 5.0000 mg | ORAL_TABLET | Freq: Three times a day (TID) | ORAL | Status: DC

## 2013-07-04 NOTE — Telephone Encounter (Signed)
Rx was sent to pharmacy electronically via [employer] 

## 2013-07-30 ENCOUNTER — Other Ambulatory Visit: Payer: Self-pay

## 2013-09-17 ENCOUNTER — Encounter: Payer: Self-pay | Admitting: Internal Medicine

## 2013-10-06 ENCOUNTER — Other Ambulatory Visit: Payer: Self-pay | Admitting: *Deleted

## 2013-10-06 ENCOUNTER — Other Ambulatory Visit: Payer: Self-pay | Admitting: Internal Medicine

## 2013-10-06 MED ORDER — METOPROLOL SUCCINATE ER 25 MG PO TB24: 25.0000 mg | ORAL_TABLET | Freq: Every day | ORAL | Status: DC

## 2013-10-06 NOTE — Telephone Encounter (Signed)
Rx was sent to pharmacy electronically. 

## 2013-10-16 ENCOUNTER — Telehealth: Payer: Self-pay | Admitting: *Deleted

## 2013-10-16 ENCOUNTER — Telehealth: Payer: Self-pay | Admitting: Internal Medicine

## 2013-10-16 MED ORDER — LOSARTAN POTASSIUM 50 MG PO TABS: 50.0000 mg | ORAL_TABLET | Freq: Every day | ORAL | Status: DC

## 2013-10-16 MED ORDER — FLUDROCORTISONE ACETATE 0.1 MG PO TABS: 0.1000 mg | ORAL_TABLET | Freq: Every day | ORAL | Status: DC

## 2013-10-16 NOTE — Telephone Encounter (Signed)
Refill requests received today.  Pt has appts w/ Dr. Johney Frame on 12.10.14 in [facility] office and w/ Dr. Royann Shivers in [place] office.    Dr. Royann Shivers notified and advised RN see which provider pt wants to see and refill Rxs for 30 days.  Call to pt and informed duplicate appts.  Pt stated she received a letter stating she had an appt w/ Dr. Johney Frame and wants to see him.  Pt informed appt in our office will be cancelled and refills given for 30-day supply.  Pt advised to ask for future refills at her appt next week.  Pt verbalized understanding and agreed w/ plan.  Refill(s) sent to pharmacy.

## 2013-10-16 NOTE — Telephone Encounter (Signed)
Mary Higgins was calling in regards to an Rx refill that was sent over 10 days Losartan, Fludrocortisone and has not heard back

## 2013-10-16 NOTE — Telephone Encounter (Signed)
Paper chart requested.

## 2013-10-16 NOTE — Telephone Encounter (Signed)
New problem     Pt need's FLUDROCORDISONE, SERTRALINE & SERTRALINA refilled @ [facility] in [place].   Give pt a call if any questions

## 2013-10-22 ENCOUNTER — Encounter: Payer: Medicare Other | Admitting: Internal Medicine

## 2013-10-29 ENCOUNTER — Ambulatory Visit: Payer: Medicare Other | Admitting: Cardiovascular Disease

## 2013-11-10 ENCOUNTER — Other Ambulatory Visit: Payer: Self-pay | Admitting: Internal Medicine

## 2013-11-10 ENCOUNTER — Other Ambulatory Visit: Payer: Self-pay | Admitting: Cardiovascular Disease

## 2013-11-14 ENCOUNTER — Other Ambulatory Visit: Payer: Self-pay | Admitting: Internal Medicine

## 2013-11-14 ENCOUNTER — Other Ambulatory Visit: Payer: Self-pay | Admitting: Cardiovascular Disease

## 2013-11-16 ENCOUNTER — Telehealth: Payer: Self-pay | Admitting: Cardiology

## 2013-11-16 ENCOUNTER — Other Ambulatory Visit: Payer: Self-pay | Admitting: Cardiology

## 2013-11-16 MED ORDER — METFORMIN HCL 500 MG PO TABS: 500.0000 mg | ORAL_TABLET | Freq: Two times a day (BID) | ORAL | Status: DC

## 2013-11-16 MED ORDER — FLUDROCORTISONE ACETATE 0.1 MG PO TABS: 0.1000 mg | ORAL_TABLET | Freq: Every day | ORAL | Status: DC

## 2013-11-16 MED ORDER — METOPROLOL SUCCINATE ER 25 MG PO TB24: 25.0000 mg | ORAL_TABLET | Freq: Every day | ORAL | Status: DC

## 2013-11-16 MED ORDER — LOSARTAN POTASSIUM 50 MG PO TABS: 50.0000 mg | ORAL_TABLET | Freq: Every day | ORAL | Status: DC

## 2013-11-16 NOTE — Telephone Encounter (Signed)
Pt has some dementia and was confused on appts.  She thought Dr. Rayann Heman was at [facility] and went to [facility] for appt.  She needs appt with Dr. Rayann Heman.  In the meantime I refilled her meds.  But needs to be seen soon.

## 2013-11-17 NOTE — Telephone Encounter (Signed)
Message forwarded to Triage at [facility] location to call pt and set up appt as this RN cannot schedule appts for Dr. Rayann Heman.

## 2013-11-17 NOTE — Telephone Encounter (Signed)
Will forward to PCC. 

## 2013-11-18 ENCOUNTER — Other Ambulatory Visit: Payer: Self-pay | Admitting: *Deleted

## 2013-11-18 MED ORDER — METFORMIN HCL 500 MG PO TABS: 500.0000 mg | ORAL_TABLET | Freq: Two times a day (BID) | ORAL | Status: DC

## 2013-11-18 NOTE — Telephone Encounter (Signed)
Rx was sent to pharmacy electronically.  Per previous refill by Mickel Baas, NP on 11/16/13

## 2013-12-12 ENCOUNTER — Other Ambulatory Visit: Payer: Self-pay | Admitting: Cardiology

## 2013-12-16 DIAGNOSIS — R55 Syncope and collapse: Secondary | ICD-10-CM | POA: Insufficient documentation

## 2013-12-17 ENCOUNTER — Encounter: Payer: Self-pay | Admitting: Internal Medicine

## 2013-12-17 ENCOUNTER — Ambulatory Visit (INDEPENDENT_AMBULATORY_CARE_PROVIDER_SITE_OTHER): Payer: Medicare Other | Admitting: Internal Medicine

## 2013-12-17 VITALS — BP 168/98 | HR 77 | Ht 71.0 in | Wt 169.0 lb

## 2013-12-17 DIAGNOSIS — R609 Edema, unspecified: Secondary | ICD-10-CM

## 2013-12-17 DIAGNOSIS — R55 Syncope and collapse: Secondary | ICD-10-CM

## 2013-12-17 DIAGNOSIS — R0602 Shortness of breath: Secondary | ICD-10-CM

## 2013-12-17 DIAGNOSIS — F17200 Nicotine dependence, unspecified, uncomplicated: Secondary | ICD-10-CM

## 2013-12-17 DIAGNOSIS — Z720 Tobacco use: Secondary | ICD-10-CM

## 2013-12-17 DIAGNOSIS — Z950 Presence of cardiac pacemaker: Secondary | ICD-10-CM

## 2013-12-17 DIAGNOSIS — I495 Sick sinus syndrome: Secondary | ICD-10-CM

## 2013-12-17 LAB — MDC_IDC_ENUM_SESS_TYPE_INCLINIC
Battery Impedance: 5300 Ohm
Battery Voltage: 2.69 V
Brady Statistic RA Percent Paced: 68 %
Brady Statistic RV Percent Paced: 1 % — CL
Date Time Interrogation Session: 20150204163034
Implantable Pulse Generator Model: 5816
Implantable Pulse Generator Serial Number: 1724515
Lead Channel Impedance Value: 407 Ohm
Lead Channel Impedance Value: 594 Ohm
Lead Channel Pacing Threshold Amplitude: 0.5 V
Lead Channel Pacing Threshold Amplitude: 1.875 V
Lead Channel Pacing Threshold Pulse Width: 0.5 ms
Lead Channel Pacing Threshold Pulse Width: 0.5 ms
Lead Channel Sensing Intrinsic Amplitude: 10.3 mV
Lead Channel Sensing Intrinsic Amplitude: 2.5 mV
Lead Channel Setting Pacing Amplitude: 2 V
Lead Channel Setting Pacing Pulse Width: 0.5 ms
Lead Channel Setting Sensing Sensitivity: 2 mV

## 2013-12-17 MED ORDER — FLUDROCORTISONE ACETATE 0.1 MG PO TABS: 0.1000 mg | ORAL_TABLET | ORAL | Status: DC

## 2013-12-17 NOTE — Patient Instructions (Signed)
Your physician recommends that you schedule a follow-up appointment in: 3 months with Truitt Merle, NP and 12 months with Dr Rayann Heman   Your physician has requested that you have a lexiscan myoview. For further information please visit [email or web address]. Please follow instruction sheet, as given.  Your physician has recommended you make the following change in your medication:  1) Decrease Florinef to every other day   Smoking Cessation Quitting smoking is important to your health and has many advantages. However, it is not always easy to quit since nicotine is a very addictive drug. Often times, people try 3 times or more before being able to quit. This document explains the best ways for you to prepare to quit smoking. Quitting takes hard work and a lot of effort, but you can do it. ADVANTAGES OF QUITTING SMOKING  You will live longer, feel better, and live better.  Your body will feel the impact of quitting smoking almost immediately.  Within 20 minutes, blood pressure decreases. Your pulse returns to its normal level.  After 8 hours, carbon monoxide levels in the blood return to normal. Your oxygen level increases.  After 24 hours, the chance of having a heart attack starts to decrease. Your breath, hair, and body stop smelling like smoke.  After 48 hours, damaged nerve endings begin to recover. Your sense of taste and smell improve.  After 72 hours, the body is virtually free of nicotine. Your bronchial tubes relax and breathing becomes easier.  After 2 to 12 weeks, lungs can hold more air. Exercise becomes easier and circulation improves.  The risk of having a heart attack, stroke, cancer, or lung disease is greatly reduced.  After 1 year, the risk of coronary heart disease is cut in half.  After 5 years, the risk of stroke falls to the same as a nonsmoker.  After 10 years, the risk of lung cancer is cut in half and the risk of other cancers decreases significantly.  After  15 years, the risk of coronary heart disease drops, usually to the level of a nonsmoker.  If you are pregnant, quitting smoking will improve your chances of having a healthy baby.  The people you live with, especially any children, will be healthier.  You will have extra money to spend on things other than cigarettes. QUESTIONS TO THINK ABOUT BEFORE ATTEMPTING TO QUIT You may want to talk about your answers with your caregiver.  Why do you want to quit?  If you tried to quit in the past, what helped and what did not?  What will be the most difficult situations for you after you quit? How will you plan to handle them?  Who can help you through the tough times? Your family? Friends? A caregiver?  What pleasures do you get from smoking? What ways can you still get pleasure if you quit? Here are some questions to ask your caregiver:  How can you help me to be successful at quitting?  What medicine do you think would be best for me and how should I take it?  What should I do if I need more help?  What is smoking withdrawal like? How can I get information on withdrawal? GET READY  Set a quit date.  Change your environment by getting rid of all cigarettes, ashtrays, matches, and lighters in your home, car, or work. Do not let people smoke in your home.  Review your past attempts to quit. Think about what worked and what did not. GET SUPPORT  AND ENCOURAGEMENT You have a better chance of being successful if you have help. You can get support in many ways.  Tell your family, friends, and co-workers that you are going to quit and need their support. Ask them not to smoke around you.  Get individual, group, or telephone counseling and support. Programs are available at [employer] and health centers. Call your local health department for information about programs in your area.  Spiritual beliefs and practices may help some smokers quit.  Download a "quit meter" on your computer  to keep track of quit statistics, such as how long you have gone without smoking, cigarettes not smoked, and money saved.  Get a self-help book about quitting smoking and staying off of tobacco. Prentiss yourself from urges to smoke. Talk to someone, go for a walk, or occupy your time with a task.  Change your normal routine. Take a different route to work. Drink tea instead of coffee. Eat breakfast in a different place.  Reduce your stress. Take a hot bath, exercise, or read a book.  Plan something enjoyable to do every day. Reward yourself for not smoking.  Explore interactive web-based programs that specialize in helping you quit. GET MEDICINE AND USE IT CORRECTLY Medicines can help you stop smoking and decrease the urge to smoke. Combining medicine with the above behavioral methods and support can greatly increase your chances of successfully quitting smoking.  Nicotine replacement therapy helps deliver nicotine to your body without the negative effects and risks of smoking. Nicotine replacement therapy includes nicotine gum, lozenges, inhalers, nasal sprays, and skin patches. Some may be available over-the-counter and others require a prescription.  Antidepressant medicine helps people abstain from smoking, but how this works is unknown. This medicine is available by prescription.  Nicotinic receptor partial agonist medicine simulates the effect of nicotine in your brain. This medicine is available by prescription. Ask your caregiver for advice about which medicines to use and how to use them based on your health history. Your caregiver will tell you what side effects to look out for if you choose to be on a medicine or therapy. Carefully read the information on the package. Do not use any other product containing nicotine while using a nicotine replacement product.  RELAPSE OR DIFFICULT SITUATIONS Most relapses occur within the first 3 months after  quitting. Do not be discouraged if you start smoking again. Remember, most people try several times before finally quitting. You may have symptoms of withdrawal because your body is used to nicotine. You may crave cigarettes, be irritable, feel very hungry, cough often, get headaches, or have difficulty concentrating. The withdrawal symptoms are only temporary. They are strongest when you first quit, but they will go away within 10 14 days. To reduce the chances of relapse, try to:  Avoid drinking alcohol. Drinking lowers your chances of successfully quitting.  Reduce the amount of caffeine you consume. Once you quit smoking, the amount of caffeine in your body increases and can give you symptoms, such as a rapid heartbeat, sweating, and anxiety.  Avoid smokers because they can make you want to smoke.  Do not let weight gain distract you. Many smokers will gain weight when they quit, usually less than 10 pounds. Eat a healthy diet and stay active. You can always lose the weight gained after you quit.  Find ways to improve your mood other than smoking. FOR MORE INFORMATION  www.smokefree.gov  Document Released: 10/24/2001 Document  Revised: 04/30/2012 Document Reviewed: 02/08/2012 [facility] Patient Information 2014 [place], [place].

## 2013-12-18 ENCOUNTER — Other Ambulatory Visit: Payer: Self-pay | Admitting: Internal Medicine

## 2013-12-18 ENCOUNTER — Other Ambulatory Visit: Payer: Self-pay | Admitting: Cardiology

## 2013-12-21 ENCOUNTER — Encounter: Payer: Self-pay | Admitting: Internal Medicine

## 2013-12-21 DIAGNOSIS — R0602 Shortness of breath: Secondary | ICD-10-CM | POA: Insufficient documentation

## 2013-12-21 DIAGNOSIS — I499 Cardiac arrhythmia, unspecified: Secondary | ICD-10-CM | POA: Insufficient documentation

## 2013-12-21 DIAGNOSIS — R609 Edema, unspecified: Secondary | ICD-10-CM | POA: Insufficient documentation

## 2013-12-21 DIAGNOSIS — I495 Sick sinus syndrome: Secondary | ICD-10-CM | POA: Insufficient documentation

## 2013-12-21 DIAGNOSIS — Z720 Tobacco use: Secondary | ICD-10-CM | POA: Insufficient documentation

## 2013-12-21 NOTE — Progress Notes (Signed)
Primary Cardiologist:  Previously Dr Rollene Fare  [name] is a 73 y.o. female with a h/o neurocardiogenic syncope with rate drop/ bradycardia observed on tilt testing sp PPM (S[facility]) by Dr Tami Ribas who presents today to establish care in the Electrophysiology device clinic.   The patient reports continues episodes of postural dizzines since having a pacemaker implanted.  She is not very active for her age.  She has rare episodes of syncope.  She continues to smoke and has no interest in cessation at this time.  She reports exertional SOB.  She feels that this has worsened recently.  Today, she  denies symptoms of palpitations, chest pain,  orthopnea, PND, lower extremity edema, dizziness,  or neurologic sequela.  The patientis tolerating medications without difficulties and is otherwise without complaint today.   Past Medical History  Diagnosis Date  . Cardiac pacemaker in situ 06/2006    Dr. Tami Ribas for sick sinus syndrome/ neurocardiogenic syncope demonstrated on tilt test  . Neurocardiogenic syncope   . COPD (chronic obstructive pulmonary disease)   . Carotid bruit     Bilateral. Mild to moderate disease on LEFT and mild on the RIGHT in 2012  . Diabetes mellitus without complication     AODM  . Hyperlipidemia   . Intracerebral bleed 2003    Remote intracerebral blled and coiling by Dr. Estanislado Pandy for cerebral aneurysm ans a shunt placed by Dr. Vertell Limber remotely. No seizure disorder and this happened in 2003 with an anterior communicating aneurysm.  Marland Kitchen Cerebral aneurysm 2003  . Anxiety and depression     Hx of  . Stumbling gait     Stumbles a lot, but has not had any falls  . Breast cancer     s/p lumpectomy   Past Surgical History  Procedure Laterality Date  . Pacemaker insertion  07/12/2006    [employer] XL DR  -  Dr. Tami Ribas  . Breast lumpectomy Right     For cancer, no recurrence  . Partial hysterectomy    . Cholecystectomy      History   Social History  .  Marital Status: Legally Separated    Spouse Name: N/A    Number of Children: N/A  . Years of Education: N/A   Occupational History  . Not on file.   Social History Main Topics  . Smoking status: Current Every Day Smoker -- 0.50 packs/day    Types: Cigarettes  . Smokeless tobacco: Not on file  . Alcohol Use: No  . Drug Use: No  . Sexual Activity: Not on file   Other Topics Concern  . Not on file   Social History Narrative  . No narrative on file    Family History  Problem Relation Age of Onset  . Hypertension      No Known Allergies  Current Outpatient Prescriptions  Medication Sig Dispense Refill  . aspirin 81 MG tablet Take 81 mg by mouth daily.      Marland Kitchen atorvastatin (LIPITOR) 20 MG tablet Take 20 mg by mouth daily.      Marland Kitchen CLONAZEPAM PO Take 1 tablet by mouth 2 (two) times daily.       . fludrocortisone (FLORINEF) 0.1 MG tablet Take 1 tablet (0.1 mg total) by mouth every other day.      . losartan (COZAAR) 50 MG tablet Take 50 mg by mouth daily.      . metFORMIN (GLUCOPHAGE) 500 MG tablet Take 500 mg by mouth 2 (two)  times daily with a meal.      . metoprolol (LOPRESSOR) 50 MG tablet Take 50 mg by mouth 2 (two) times daily.      . midodrine (PROAMATINE) 5 MG tablet Take 1 tablet (5 mg total) by mouth 3 (three) times daily.  90 tablet  8  . pantoprazole (PROTONIX) 40 MG tablet Take 40 mg by mouth daily.      . ramelteon (ROZEREM) 8 MG tablet Take 8 mg by mouth at bedtime.      . sertraline (ZOLOFT) 50 MG tablet Take 50 mg by mouth daily.      Marland Kitchen losartan (COZAAR) 50 MG tablet TAKE 1 TABLET BY MOUTH DAILY  30 tablet  0   No current facility-administered medications for this visit.    ROS- all systems are reviewed and negative except as per HPI  Physical Exam: Filed Vitals:   12/17/13 1234  BP: 168/98  Pulse: 77  Height: 5\' 11"  (1.803 m)  Weight: 169 lb (76.658 kg)    GEN- The patient is well appearing, alert and oriented x 3 today.   Head- normocephalic,  atraumatic Eyes-  Sclera clear, conjunctiva pink Ears- hearing intact Oropharynx- clear Neck- supple, no JVP Lymph- no cervical lymphadenopathy Lungs- Clear to ausculation bilaterally with a prolonged expiratory phase, normal work of breathing Chest- pacemaker pocket is well healed Heart- Regular rate and rhythm, no murmurs, rubs or gallops, PMI not laterally displaced GI- soft, NT, ND, + BS Extremities- no clubbing, cyanosis, +1 edema MS- no significant deformity or atrophy Skin- no rash or lesion Psych- euthymic mood, full affect Neuro- strength and sensation are intact  Pacemaker interrogation- reviewed in detail today,  See PACEART report  Assessment and Plan:  1. Neurocardiogenic syncope/ sinus node dysfunction Normal pacemaker function See Pace Art report I have decreased lower rate to 60 bpm today. I will consider Biotronik for CLS technology at time of generator change (presently she has 0.75-1.25 years of battery longevity remaining) Support hose advised Decrease florinef given SOB and concerns for fluid overload  2. Tobacco Cessation advised  3. SOB Unclear etiology, but likely due to COPD Given her multiple CAD risk factors including ongoing tobacco use and diabetes, I will order a lexiscan myoview at this time.  4. Edema Likely exacerbated by florinef Support hose advised Decrease florinef to every other day  Follow-up with [name] in 3 months Return to the device clinic in 6 months  I will see in a year TTMs

## 2013-12-29 ENCOUNTER — Encounter: Payer: Self-pay | Admitting: Internal Medicine

## 2014-01-01 ENCOUNTER — Encounter (HOSPITAL_COMMUNITY): Payer: Medicare Other

## 2014-01-13 ENCOUNTER — Encounter: Payer: Self-pay | Admitting: Internal Medicine

## 2014-01-16 ENCOUNTER — Other Ambulatory Visit: Payer: Self-pay | Admitting: Internal Medicine

## 2014-01-23 ENCOUNTER — Other Ambulatory Visit: Payer: Self-pay | Admitting: Cardiology

## 2014-01-27 ENCOUNTER — Other Ambulatory Visit: Payer: Self-pay | Admitting: Internal Medicine

## 2014-01-27 ENCOUNTER — Other Ambulatory Visit: Payer: Self-pay | Admitting: *Deleted

## 2014-01-27 MED ORDER — METOPROLOL TARTRATE 50 MG PO TABS: 50.0000 mg | ORAL_TABLET | Freq: Two times a day (BID) | ORAL | Status: DC

## 2014-01-27 NOTE — Telephone Encounter (Signed)
Kim, can you look at this? I am unsure of which form of the metoprolol the patient is to be taking. Thanks, MI

## 2014-02-10 ENCOUNTER — Encounter: Payer: Self-pay | Admitting: Internal Medicine

## 2014-02-18 ENCOUNTER — Encounter: Payer: Self-pay | Admitting: Internal Medicine

## 2014-03-09 ENCOUNTER — Other Ambulatory Visit: Payer: Self-pay | Admitting: *Deleted

## 2014-03-10 ENCOUNTER — Other Ambulatory Visit: Payer: Self-pay | Admitting: *Deleted

## 2014-03-10 ENCOUNTER — Other Ambulatory Visit: Payer: Self-pay | Admitting: Internal Medicine

## 2014-03-10 NOTE — Telephone Encounter (Signed)
Dr Rayann Heman can we refill her zoloft? Thanks, KIM ADAMS,RN

## 2014-03-10 NOTE — Telephone Encounter (Signed)
Refill for sertraline refused - defer to Dr. Clearnce Hasten - Dr. Rollene Fare has retired

## 2014-03-16 ENCOUNTER — Other Ambulatory Visit: Payer: Self-pay | Admitting: Internal Medicine

## 2014-03-16 NOTE — Telephone Encounter (Signed)
Dr Rayann Heman pharmacy has requested a refill for zoloft on this patient?

## 2014-03-17 ENCOUNTER — Other Ambulatory Visit: Payer: Self-pay | Admitting: Cardiology

## 2014-03-18 ENCOUNTER — Encounter: Payer: Self-pay | Admitting: Internal Medicine

## 2014-03-18 ENCOUNTER — Ambulatory Visit (INDEPENDENT_AMBULATORY_CARE_PROVIDER_SITE_OTHER): Payer: Medicare Other | Admitting: Internal Medicine

## 2014-03-18 VITALS — BP 120/90 | HR 80 | Temp 97.8°F | Resp 18 | Ht 71.0 in | Wt 167.4 lb

## 2014-03-18 DIAGNOSIS — Z720 Tobacco use: Secondary | ICD-10-CM

## 2014-03-18 DIAGNOSIS — F329 Major depressive disorder, single episode, unspecified: Secondary | ICD-10-CM

## 2014-03-18 DIAGNOSIS — R109 Unspecified abdominal pain: Secondary | ICD-10-CM

## 2014-03-18 DIAGNOSIS — R55 Syncope and collapse: Secondary | ICD-10-CM

## 2014-03-18 DIAGNOSIS — R5383 Other fatigue: Secondary | ICD-10-CM

## 2014-03-18 DIAGNOSIS — E119 Type 2 diabetes mellitus without complications: Secondary | ICD-10-CM

## 2014-03-18 DIAGNOSIS — E1165 Type 2 diabetes mellitus with hyperglycemia: Secondary | ICD-10-CM | POA: Insufficient documentation

## 2014-03-18 DIAGNOSIS — N63 Unspecified lump in unspecified breast: Secondary | ICD-10-CM

## 2014-03-18 DIAGNOSIS — S0990XA Unspecified injury of head, initial encounter: Secondary | ICD-10-CM

## 2014-03-18 DIAGNOSIS — R5381 Other malaise: Secondary | ICD-10-CM

## 2014-03-18 DIAGNOSIS — R269 Unspecified abnormalities of gait and mobility: Secondary | ICD-10-CM

## 2014-03-18 DIAGNOSIS — R609 Edema, unspecified: Secondary | ICD-10-CM

## 2014-03-18 DIAGNOSIS — F17200 Nicotine dependence, unspecified, uncomplicated: Secondary | ICD-10-CM

## 2014-03-18 DIAGNOSIS — R0602 Shortness of breath: Secondary | ICD-10-CM

## 2014-03-18 DIAGNOSIS — Z9181 History of falling: Secondary | ICD-10-CM

## 2014-03-18 DIAGNOSIS — F32A Depression, unspecified: Secondary | ICD-10-CM | POA: Insufficient documentation

## 2014-03-18 DIAGNOSIS — I10 Essential (primary) hypertension: Secondary | ICD-10-CM

## 2014-03-18 DIAGNOSIS — G245 Blepharospasm: Secondary | ICD-10-CM

## 2014-03-18 DIAGNOSIS — K219 Gastro-esophageal reflux disease without esophagitis: Secondary | ICD-10-CM

## 2014-03-18 DIAGNOSIS — F3289 Other specified depressive episodes: Secondary | ICD-10-CM

## 2014-03-18 DIAGNOSIS — G4700 Insomnia, unspecified: Secondary | ICD-10-CM

## 2014-03-18 DIAGNOSIS — I609 Nontraumatic subarachnoid hemorrhage, unspecified: Secondary | ICD-10-CM

## 2014-03-18 MED ORDER — LOSARTAN POTASSIUM 50 MG PO TABS: ORAL_TABLET | ORAL | Status: DC

## 2014-03-18 MED ORDER — HYDROCODONE-ACETAMINOPHEN 5-325 MG PO TABS: ORAL_TABLET | ORAL | Status: DC

## 2014-03-18 MED ORDER — PANTOPRAZOLE SODIUM 40 MG PO TBEC: DELAYED_RELEASE_TABLET | ORAL | Status: DC

## 2014-03-18 MED ORDER — MIRTAZAPINE 15 MG PO TABS: ORAL_TABLET | ORAL | Status: DC

## 2014-03-18 MED ORDER — CLONAZEPAM 1 MG PO TABS: ORAL_TABLET | ORAL | Status: DC

## 2014-03-18 MED ORDER — METFORMIN HCL 500 MG PO TABS: ORAL_TABLET | ORAL | Status: DC

## 2014-03-18 NOTE — Patient Instructions (Signed)
Review and use medications as listed.

## 2014-03-18 NOTE — Progress Notes (Signed)
Patient ID: Mary Higgins, female   DOB: 11/07/1941, 73 y.o.   MRN: [id number]    Location:    [facility]  Place of Service:  OFFICE  PCP: No PCP Per Patient  Code Status: Unknown  Extended Emergency Contact Information Primary Emergency Contact: [place] Address: [address] [place],  [address] Home Phone: [phone] Mobile Phone: [phone] Relation: None  No Known Allergies  Chief Complaint  Patient presents with  . Annual Exam    Yearly check-up, no pap, heart followed by Cardiology, no recent labs.     HPI:  Multiple falls. Worse recently.  Large hematoma right lower leg laterally.  Sleeping too much.  Trouble breathing. DOE.  Reports a lump in the right breast     Past Medical History  Diagnosis Date  . Cardiac pacemaker in situ 06/2006    Dr. Tami Ribas for sick sinus syndrome/ neurocardiogenic syncope demonstrated on tilt test  . Neurocardiogenic syncope   . COPD (chronic obstructive pulmonary disease)   . Carotid bruit     Bilateral. Mild to moderate disease on LEFT and mild on the RIGHT in 2012  . Diabetes mellitus without complication     AODM  . Hyperlipidemia   . Intracerebral bleed 2003    Remote intracerebral blled and coiling by Dr. Estanislado Pandy for cerebral aneurysm ans a shunt placed by Dr. Vertell Limber remotely. No seizure disorder and this happened in 2003 with an anterior communicating aneurysm.  Marland Kitchen Cerebral aneurysm 2003  . Anxiety and depression     Hx of  . Stumbling gait     Stumbles a lot, but has not had any falls  . Breast cancer     s/p lumpectomy  . Type II or unspecified type diabetes mellitus without mention of complication, not stated as uncontrolled 03/18/2014  . Tobacco abuse 12/21/2013  . Shortness of breath 12/21/2013  . Other malaise and fatigue 03/18/2014  . Insomnia 03/18/2014  . Hypertension 03/18/2014  . History of fall 03/18/2014  . GERD (gastroesophageal reflux disease) 03/18/2014  . Flank pain 03/18/2014  . Edema 12/21/2013   . Depression due to head injury 03/18/2014  . Breast mass 03/18/2014  . Blepharospasm 03/18/2014  . Abnormality of gait 03/18/2014  . Sinus node dysfunction 12/21/2013    Past Surgical History  Procedure Laterality Date  . Pacemaker insertion  07/12/2006    [employer] XL DR  -  Dr. Tami Ribas  . Breast lumpectomy Right     For cancer, no recurrence  . Partial hysterectomy    . Cholecystectomy    . Aneurysm coiling  2003    Cerebral aneurysm ;Dr. Luanne Bras  . Ventriculoperitoneal shunt  2003    Dr. Erline Levine    CONSULTANTS Cardiology: Dr. Thompson Grayer Gynecology: Dr. Colin Ina Ophthalmology: Dr. Heide Spark at the Interventional radiology: Dr. Luanne Bras Neurosurgery: Dr. Erline Levine Pulmonary: Dr. Asencion Noble Oncology: Dr. Jana Hakim  PAST PROCEDURES 08/06/00 bone density: osteoporosis 2003 V-P shunt. 2003 Coiling of cerebral aneurysm after [facility] 09/29/02 CT abdomen and pelvis: normal with presence of ventriculoperitoneal shunt. 04/08/05 MR brain:mild atrophy. Chronic hydrocephalus. Ventricular catheter in good position. Small left parieto-occipital falcine meningioma. 5/27/06MR angiogram: negative. No evidence for regrowth of the left anterior communicating artery aneurysm, which has been coiled. 06/05/06 MR angiogram head 05/27/07 CT head: intact ventriculoperitoneal shunt. Stable mild ventricular dilation. 09/30/09 bone density:osteoporosis 03/31/13 carotid Doppler: right bulb and proximal ICA 0-49% diameter reduction. Left bulb and  ICA 50-69% diameter reduction  03/31/13 2-D echocardiogram: - Left ventricle: The cavity size was normal. Wall thickness was increased in a pattern of mild LVH. There was mild concentric hypertrophy. Systolic function was normal. The estimated ejection fraction was in the range of 55% to 60%. Wall motion was normal; there were no regional wall motion abnormalities. Doppler parameters are consistent with abnormal left ventricular  relaxation (grade 1 diastolic dysfunction). - Mitral valve: Calcified annulus. Moderately calcified leaflets posterior. Mild regurgitation. Valve area by pressure half-time: 2.22cm^2. - Atrial septum: No defect or patent foramen ovale was identified. Impressions:- Overriding PTVP with sinus rhythm. No intraventricular dyssynchrony           Social History: History   Social History  . Marital Status: Legally Separated    Spouse Name: N/A    Number of Children: N/A  . Years of Education: N/A   Social History Main Topics  . Smoking status: Current Every Day Smoker -- 0.50 packs/day    Types: Cigarettes  . Smokeless tobacco: None  . Alcohol Use: No  . Drug Use: No  . Sexual Activity: None   Other Topics Concern  . None   Social History Narrative  . None    Family History No family status information on file.   Family History  Problem Relation Age of Onset  . Hypertension       Medications: Patient's Medications  New Prescriptions   No medications on file  Previous Medications   ASPIRIN 81 MG TABLET    Take 81 mg by mouth daily.   ATORVASTATIN (LIPITOR) 20 MG TABLET    Take 20 mg by mouth daily.   CLONAZEPAM PO    Take 1 tablet by mouth 2 (two) times daily.    HYDROCODONE-ACETAMINOPHEN (NORCO/VICODIN) 5-325 MG PER TABLET    Take 2 tablets by mouth daily.   LOSARTAN (COZAAR) 50 MG TABLET    TAKE 1 TABLET BY MOUTH EVERY DAY   METFORMIN (GLUCOPHAGE) 500 MG TABLET    Take 500 mg by mouth 2 (two) times daily with a meal.   METOPROLOL (LOPRESSOR) 50 MG TABLET    Take 1 tablet (50 mg total) by mouth 2 (two) times daily.   MIDODRINE (PROAMATINE) 5 MG TABLET    Take 1 tablet (5 mg total) by mouth 3 (three) times daily.   PANTOPRAZOLE (PROTONIX) 40 MG TABLET    Take 40 mg by mouth daily.   RAMELTEON (ROZEREM) 8 MG TABLET    Take 8 mg by mouth at bedtime.   SERTRALINE (ZOLOFT) 50 MG TABLET    Take 50 mg by mouth daily.  Modified Medications   No medications on  file  Discontinued Medications   FLUDROCORTISONE (FLORINEF) 0.1 MG TABLET    Take 1 tablet (0.1 mg total) by mouth every other day.   LOSARTAN (COZAAR) 50 MG TABLET    Take 50 mg by mouth daily.     There is no immunization history on file for this patient.   Review of Systems  Constitutional: Positive for appetite change (Decreased) and fatigue.  HENT: Positive for hearing loss.        Dentures  Eyes: Positive for visual disturbance (Corrective lenses).       Xerophthalmia  Respiratory: Negative for cough and shortness of breath.   Cardiovascular: Positive for palpitations and leg swelling. Negative for chest pain.  Gastrointestinal: Negative for abdominal pain, abdominal distention and anal bleeding.       Hemorrhoids  Endocrine:  Negative.        History of diabetes  Genitourinary: Negative.   Musculoskeletal: Positive for gait problem.       History of falls and gait instability.  Skin: Negative.   Allergic/Immunologic: Negative.   Neurological: Positive for dizziness.       History of neurocardiogenic syncope. Memory disorder.  Psychiatric/Behavioral: Negative.       Filed Vitals:   03/18/14 1649  BP: 120/90  Pulse: 80  Temp: 97.8 F (36.6 C)  TempSrc: Oral  Resp: 18  Height: 5\' 11"  (1.803 m)  Weight: 167 lb 6.4 oz (75.932 kg)  SpO2: 99%   Physical Exam  Constitutional: She is oriented to person, place, and time. She appears well-developed and well-nourished. She appears distressed.  HENT:  Right Ear: External ear normal.  Left Ear: External ear normal.  Nose: Nose normal.  Mouth/Throat: Oropharynx is clear and moist. No oropharyngeal exudate.  Ventriculoperitoneal shunt left forehead Upper dentures Bilateral bleparospasm  Eyes: Conjunctivae and EOM are normal. Pupils are equal, round, and reactive to light.  Prescription lenses  Neck: No JVD present. No tracheal deviation present. No thyromegaly present.  Cardiovascular: Normal rate, regular rhythm,  normal heart sounds and intact distal pulses.  Exam reveals no gallop and no friction rub.   No murmur heard. Pacemaker right upper chest  Respiratory: No respiratory distress. She has no wheezes. She has no rales. She exhibits no tenderness.  Breasts atrophy. Left breast scar from previous breast cancer surgery  GI: She exhibits no distension and no mass. There is no tenderness.  Genitourinary:  Hemorrhoid  Musculoskeletal: Normal range of motion. She exhibits edema.  Lymphadenopathy:    She has no cervical adenopathy.  Neurological: She is alert and oriented to person, place, and time. She has normal reflexes. No cranial nerve deficit.  Skin: No rash noted. No erythema. No pallor.  Psychiatric: She has a normal mood and affect. Her behavior is normal. Judgment and thought content normal.       Labs reviewed: No visits with results within 3 Month(s) from this visit. Latest known visit with results is:  Office Visit on 12/17/2013  Component Date Value Ref Range Status  . Date Time Interrogation Session 12/17/2013 [id number]   Final  . Pulse Generator Manufacturer 12/17/2013 [employer]   Final  . Pulse Gen Model 12/17/2013 [address]   Final  . Pulse Gen Serial Number 12/17/2013 [id number]   Final  . RV Sense Sensitivity 12/17/2013 2   Final  . RA Pace Amplitude 12/17/2013 2   Final  . RV Pace PulseWidth 12/17/2013 0.5   Final  . RA Impedance 12/17/2013 594   Final  . RA Amplitude 12/17/2013 2.5   Final  . RA Pacing Amplitude 12/17/2013 0.5   Final  . RA Pacing PulseWidth 12/17/2013 0.5   Final  . RV IMPEDANCE 12/17/2013 407   Final  . RV Amplitude 12/17/2013 10.3   Final  . RV Pacing Amplitude 12/17/2013 1.875   Final  . RV Pacing PulseWidth 12/17/2013 0.5   Final  . Battery Status 12/17/2013 Unknown   Final  . Battery Longevity 12/17/2013 0.75 - 1.25 years   Final  . Battery Voltage 12/17/2013 2.69   Final  . Battery Impedance 12/17/2013 5300   Final  .  Loletha Grayer RA Perc Paced 12/17/2013 68   Final  . Loletha Grayer RV Perc Paced 12/17/2013 1*  Final  . Eval Rhythm 12/17/2013 SR @ 71  Final  . Miscellaneous Comment 12/17/2013    Final                   Value:Pacemaker check in clinic. Normal device function. Thresholds, sensing, impedances consistent with previous measurements. Device programmed to maximize longevity. No mode switches. 1 NSVT---12 beats. Device programmed at appropriate safety margins.                          Histogram distribution appropriate for patient activity level. Device programmed to optimize intrinsic conduction---changed base rate from 70 to 60bpm. Estimated longevity 0.75-1.72yrs. ROV w/ device clinic in 50mo & w/ JA in 24mo.     Assessment/Plan 1. Abnormality of gait - CT Head W Wo Contrast; Future  2. Type II or unspecified type diabetes mellitus without mention of complication, not stated as uncontrolled - metFORMIN (GLUCOPHAGE) 500 MG tablet; One with breakfast and one with supper to control diabetes  Dispense: 60 tablet; Refill: 3  3. Hypertension - losartan (COZAAR) 50 MG tablet; One daily to control BP and strengthen the heart  Dispense: 30 tablet; Refill: 3  4. Depression due to head injury - mirtazapine (REMERON) 15 MG tablet; One at bed to help nerves and rest  Dispense: 30 tablet; Refill: 3  5. Blepharospasm Has has botox - clonazePAM (KLONOPIN) 1 MG tablet; One twice daily to help control spasms at eyes  Dispense: 60 tablet; Refill: 3  6. Edema 1+ bilateral  7. Tobacco abuse encouraged to stop  8. Insomnia On mirtazapine  9. GERD (gastroesophageal reflux disease) - pantoprazole (PROTONIX) 40 MG tablet; One daily to reduce stomach acid  Dispense: 30 tablet; Refill: 3  10. Flank pain improved - HYDROcodone-acetaminophen (NORCO/VICODIN) 5-325 MG per tablet; One up to 4 times daily to control pain  Dispense: 120 tablet; Refill: 0  11. Breast mass - MM Digital Diagnostic Bilat; Future  12. History  of fall Reports increasingly unstable gait. Hx of ventriculomegaly.  May need to see neurosurgery again to be sure V-P shunt is working correctly. Injured with large hematoma of the right calf  13. Other malaise and fatigue - CBC With differential/Platelet; Future - CMP; Future - TSH; Future  14. Shortness of breath observe  15. Neurocardiogenic syncope Followed by Dr. Rayann Heman  16. Subarachnoid hemorrhage Occurred in 2003

## 2014-03-24 ENCOUNTER — Inpatient Hospital Stay: Admission: RE | Admit: 2014-03-24 | Payer: Medicare Other | Source: Ambulatory Visit

## 2014-03-24 NOTE — Telephone Encounter (Signed)
Needs to obtain from PCP.  

## 2014-03-25 ENCOUNTER — Telehealth: Payer: Self-pay | Admitting: *Deleted

## 2014-03-25 ENCOUNTER — Other Ambulatory Visit: Payer: Medicare Other

## 2014-03-25 NOTE — Telephone Encounter (Signed)
Stop Remeron. I would rather not start anything else until the rash disappears. Does she need anything for the itching? Can she describe the rash?

## 2014-03-25 NOTE — Telephone Encounter (Signed)
Patient called and stated that she had an appointment last week and was placed on Remeron and now she has a red itchy rash. Can something else be prescribed? Please Advise.

## 2014-03-26 NOTE — Telephone Encounter (Signed)
Patient states that the rash is looking better and has stopped the medication. Patient stated that she will discuss futher treatment at her appointment next week.

## 2014-03-29 ENCOUNTER — Encounter: Payer: Self-pay | Admitting: Internal Medicine

## 2014-03-30 ENCOUNTER — Other Ambulatory Visit: Payer: Self-pay

## 2014-04-03 ENCOUNTER — Other Ambulatory Visit: Payer: Medicare Other

## 2014-04-08 ENCOUNTER — Ambulatory Visit: Payer: Self-pay | Admitting: Internal Medicine

## 2014-04-11 ENCOUNTER — Other Ambulatory Visit: Payer: Self-pay | Admitting: Internal Medicine

## 2014-04-13 ENCOUNTER — Other Ambulatory Visit: Payer: Medicare Other

## 2014-04-16 ENCOUNTER — Encounter (HOSPITAL_COMMUNITY): Payer: Self-pay | Admitting: *Deleted

## 2014-04-16 ENCOUNTER — Other Ambulatory Visit: Payer: Self-pay | Admitting: *Deleted

## 2014-04-16 DIAGNOSIS — G245 Blepharospasm: Secondary | ICD-10-CM

## 2014-04-16 MED ORDER — CLONAZEPAM 1 MG PO TABS: ORAL_TABLET | ORAL | Status: DC

## 2014-04-16 NOTE — Telephone Encounter (Signed)
[facility] [place] 

## 2014-04-17 ENCOUNTER — Other Ambulatory Visit: Payer: Self-pay | Admitting: *Deleted

## 2014-04-17 MED ORDER — ATORVASTATIN CALCIUM 40 MG PO TABS: 40.0000 mg | ORAL_TABLET | Freq: Every day | ORAL | Status: DC

## 2014-04-28 ENCOUNTER — Telehealth: Payer: Self-pay | Admitting: *Deleted

## 2014-04-28 NOTE — Telephone Encounter (Signed)
Patient called and left a message on voicemail and stated that she fell and hit her wrist on concrete steps and went and saw another Dr. For this. Patient wants [place] to give her Hydrocodone for the pain. I tried calling the patient patient back and had to leave a message. Patient needs an appointment for this for [place] to give her Hydrocodone for wrist pain.

## 2014-05-01 ENCOUNTER — Other Ambulatory Visit: Payer: Medicare Other

## 2014-05-05 ENCOUNTER — Telehealth (HOSPITAL_COMMUNITY): Payer: Self-pay

## 2014-05-07 ENCOUNTER — Ambulatory Visit (HOSPITAL_COMMUNITY)
Admission: RE | Admit: 2014-05-07 | Discharge: 2014-05-07 | Disposition: A | Discharge status: Home / Self Care | Payer: Medicare Other | Source: Ambulatory Visit | Attending: Cardiology | Admitting: Cardiology

## 2014-05-07 DIAGNOSIS — R0609 Other forms of dyspnea: Secondary | ICD-10-CM | POA: Insufficient documentation

## 2014-05-07 DIAGNOSIS — R5383 Other fatigue: Secondary | ICD-10-CM | POA: Insufficient documentation

## 2014-05-07 DIAGNOSIS — R42 Dizziness and giddiness: Secondary | ICD-10-CM | POA: Insufficient documentation

## 2014-05-07 DIAGNOSIS — R079 Chest pain, unspecified: Secondary | ICD-10-CM | POA: Insufficient documentation

## 2014-05-07 DIAGNOSIS — R002 Palpitations: Secondary | ICD-10-CM | POA: Insufficient documentation

## 2014-05-07 DIAGNOSIS — R5381 Other malaise: Secondary | ICD-10-CM | POA: Insufficient documentation

## 2014-05-07 DIAGNOSIS — R0989 Other specified symptoms and signs involving the circulatory and respiratory systems: Secondary | ICD-10-CM | POA: Insufficient documentation

## 2014-05-07 DIAGNOSIS — R0602 Shortness of breath: Secondary | ICD-10-CM | POA: Insufficient documentation

## 2014-05-07 MED ORDER — REGADENOSON 0.4 MG/5ML IV SOLN
0.4000 mg | Freq: Once | INTRAVENOUS | Status: AC
  Administered 2014-05-07: 0.4 mg via INTRAVENOUS

## 2014-05-07 MED ORDER — TECHNETIUM TC 99M SESTAMIBI GENERIC - CARDIOLITE
10.5000 | Freq: Once | INTRAVENOUS | Status: AC | PRN
  Administered 2014-05-07: 11 via INTRAVENOUS

## 2014-05-07 MED ORDER — TECHNETIUM TC 99M SESTAMIBI GENERIC - CARDIOLITE
31.4000 | Freq: Once | INTRAVENOUS | Status: AC | PRN
  Administered 2014-05-07: 31 via INTRAVENOUS

## 2014-05-07 MED ORDER — AMINOPHYLLINE 25 MG/ML IV SOLN
75.0000 mg | Freq: Once | INTRAVENOUS | Status: AC
  Administered 2014-05-07: 75 mg via INTRAVENOUS

## 2014-05-07 NOTE — Procedures (Addendum)
[facility] [address] [place] [address] [phone]  Cardiology Nuclear Med Study  Mary Higgins is a 73 y.o. female     MRN : [id number]     DOB: 1940/11/18  Procedure Date: 05/07/2014  Nuclear Med Background Indication for Stress Test:  Evaluation for Ischemia History:  Pacer;neurocardiogenic syncope;COPD;altered gate;Last NUC MPI on 09/29/2010-non diagnostic for ischemia;EF=65% Cardiac Risk Factors: Carotid Disease, Family History - CAD, Hypertension, Lipids, NIDDM, Overweight, PVD, Smoker and intracerebral bleed  Symptoms:  Chest Pain, Dizziness, DOE, Fatigue, Light-Headedness, Palpitations and SOB   Nuclear Pre-Procedure Caffeine/Decaff Intake:  9:00pm NPO After: 7:00am   IV Site: R Forearm  IV 0.9% NS with Angio Cath:  22g  Chest Size (in):  n/a IV Started by: Rolene Course, RN  Height: 5\' 11"  (1.803 m)  Cup Size: B  BMI:  Body mass index is 23.58 kg/(m^2). Weight:  169 lb (76.658 kg)   Tech Comments:  n/a    Nuclear Med Study 1 or 2 day study: 1 day  Stress Test Type:  [place] Provider:  Thompson Grayer, MD   Resting Radionuclide: Technetium 77m Sestamibi  Resting Radionuclide Dose: 10.5 mCi   Stress Radionuclide:  Technetium 65m Sestamibi  Stress Radionuclide Dose: 31.4 mCi           Stress Protocol Rest HR: 60 Stress HR: 71  Rest BP: 132/85 Stress BP: 132/81  Exercise Time (min): n/a METS: n/a   Predicted Max HR: 148 bpm % Max HR: 54.05 bpm Rate Pressure Product: 11200  Dose of Adenosine (mg):  n/a Dose of Lexiscan: 0.4 mg  Dose of Atropine (mg): n/a Dose of Dobutamine: n/a mcg/kg/min (at max HR)  Stress Test Technologist: Leane Para, CCT Nuclear Technologist: Imagene Riches, CNMT   Rest Procedure:  Myocardial perfusion imaging was performed at rest 45 minutes following the intravenous administration of Technetium 49m Sestamibi. Stress Procedure:  The patient received  IV Lexiscan 0.4 mg over 15-seconds.  Technetium 43m Sestamibi injected Iv at 30-seconds. Patient experienced SOB and head sensation and 75 mg of Aminophylline IV was administered. There were no significant changes with Lexiscan.  Quantitative spect images were obtained after a 45 minute delay.  Transient Ischemic Dilatation (Normal <1.22):  1.12  QGS EDV:  70 ml QGS ESV:  16 ml LV Ejection Fraction: 77%     Rest ECG: A paced rhythm; nondiagnostic small inferior Q waves  Stress ECG: No significant change from baseline ECG  QPS Raw Data Images:  Normal; no motion artifact; normal heart/lung ratio. Stress Images:  Normal homogeneous uptake in all areas of the myocardium. Rest Images:  Normal homogeneous uptake in all areas of the myocardium. Subtraction (SDS):  Normal  Impression Exercise Capacity:  Lexiscan with no exercise. BP Response:  Normal blood pressure response. Clinical Symptoms:  No significant symptoms noted. ECG Impression:  No significant ST segment change suggestive of ischemia. Comparison with Prior Nuclear Study: No significant change from previous study  Overall Impression:  Normal stress nuclear study.  LV Wall Motion:  NL LV Function, EF 77%; NL Wall Motion   Shelva Majestic A, MD  05/07/2014 2:37 PM

## 2014-05-08 ENCOUNTER — Encounter (HOSPITAL_COMMUNITY): Payer: Self-pay | Admitting: Emergency Medicine

## 2014-05-08 ENCOUNTER — Emergency Department (HOSPITAL_COMMUNITY): Payer: Medicare Other

## 2014-05-08 ENCOUNTER — Emergency Department (HOSPITAL_COMMUNITY)
Admission: EM | Admit: 2014-05-08 | Discharge: 2014-05-08 | Disposition: A | Discharge status: Home / Self Care | Payer: Medicare Other | Attending: Emergency Medicine | Admitting: Emergency Medicine

## 2014-05-08 DIAGNOSIS — Y929 Unspecified place or not applicable: Secondary | ICD-10-CM | POA: Insufficient documentation

## 2014-05-08 DIAGNOSIS — Z7982 Long term (current) use of aspirin: Secondary | ICD-10-CM | POA: Insufficient documentation

## 2014-05-08 DIAGNOSIS — F17200 Nicotine dependence, unspecified, uncomplicated: Secondary | ICD-10-CM | POA: Insufficient documentation

## 2014-05-08 DIAGNOSIS — Z8669 Personal history of other diseases of the nervous system and sense organs: Secondary | ICD-10-CM | POA: Insufficient documentation

## 2014-05-08 DIAGNOSIS — G4700 Insomnia, unspecified: Secondary | ICD-10-CM | POA: Insufficient documentation

## 2014-05-08 DIAGNOSIS — S0083XA Contusion of other part of head, initial encounter: Principal | ICD-10-CM | POA: Insufficient documentation

## 2014-05-08 DIAGNOSIS — F341 Dysthymic disorder: Secondary | ICD-10-CM | POA: Insufficient documentation

## 2014-05-08 DIAGNOSIS — J4489 Other specified chronic obstructive pulmonary disease: Secondary | ICD-10-CM | POA: Insufficient documentation

## 2014-05-08 DIAGNOSIS — S1093XA Contusion of unspecified part of neck, initial encounter: Principal | ICD-10-CM

## 2014-05-08 DIAGNOSIS — Y9389 Activity, other specified: Secondary | ICD-10-CM | POA: Insufficient documentation

## 2014-05-08 DIAGNOSIS — Z79899 Other long term (current) drug therapy: Secondary | ICD-10-CM | POA: Insufficient documentation

## 2014-05-08 DIAGNOSIS — J449 Chronic obstructive pulmonary disease, unspecified: Secondary | ICD-10-CM | POA: Insufficient documentation

## 2014-05-08 DIAGNOSIS — Z9181 History of falling: Secondary | ICD-10-CM | POA: Insufficient documentation

## 2014-05-08 DIAGNOSIS — S0003XA Contusion of scalp, initial encounter: Secondary | ICD-10-CM | POA: Insufficient documentation

## 2014-05-08 DIAGNOSIS — Z8739 Personal history of other diseases of the musculoskeletal system and connective tissue: Secondary | ICD-10-CM | POA: Insufficient documentation

## 2014-05-08 DIAGNOSIS — E119 Type 2 diabetes mellitus without complications: Secondary | ICD-10-CM | POA: Insufficient documentation

## 2014-05-08 DIAGNOSIS — Z853 Personal history of malignant neoplasm of breast: Secondary | ICD-10-CM | POA: Insufficient documentation

## 2014-05-08 DIAGNOSIS — R296 Repeated falls: Secondary | ICD-10-CM | POA: Insufficient documentation

## 2014-05-08 DIAGNOSIS — I10 Essential (primary) hypertension: Secondary | ICD-10-CM | POA: Insufficient documentation

## 2014-05-08 DIAGNOSIS — Z8742 Personal history of other diseases of the female genital tract: Secondary | ICD-10-CM | POA: Insufficient documentation

## 2014-05-08 DIAGNOSIS — E785 Hyperlipidemia, unspecified: Secondary | ICD-10-CM | POA: Insufficient documentation

## 2014-05-08 DIAGNOSIS — Z8719 Personal history of other diseases of the digestive system: Secondary | ICD-10-CM | POA: Insufficient documentation

## 2014-05-08 DIAGNOSIS — W19XXXA Unspecified fall, initial encounter: Secondary | ICD-10-CM

## 2014-05-08 HISTORY — DX: Endothelial corneal dystrophy, unspecified eye: H18.519

## 2014-05-08 HISTORY — DX: Endothelial corneal dystrophy: H18.51

## 2014-05-08 MED ORDER — OXYCODONE-ACETAMINOPHEN 5-325 MG PO TABS
1.0000 | ORAL_TABLET | Freq: Once | ORAL | Status: AC
  Administered 2014-05-08: 1 via ORAL
  Filled 2014-05-08: qty 1

## 2014-05-08 MED ORDER — OXYCODONE-ACETAMINOPHEN 5-325 MG PO TABS
1.0000 | ORAL_TABLET | Freq: Once | ORAL | Status: DC
Start: 2014-05-08 — End: 2014-08-31

## 2014-05-08 NOTE — ED Provider Notes (Signed)
CSN: [id number]     Arrival date & time 05/08/14  2104 History   First MD Initiated Contact with Patient 05/08/14 2112     Chief Complaint  Patient presents with  . Fall      HPI Per EMS report: pt from home: Pt was standing on 2 feet tall brick wall when pt lost her balance and fell straight back. Pt denies any LOC. Pt was immediately able to get up and walk to steps to sit down. Pt denies neck or back pain. Pt c/o headache and buttock pain. Pt a/o x 4. Pt hx falls d/t balance issues. Pt has a hematoma on the back of her head.  Past Medical History  Diagnosis Date  . Cardiac pacemaker in situ 06/2006    Dr. Tami Ribas for sick sinus syndrome/ neurocardiogenic syncope demonstrated on tilt test  . Neurocardiogenic syncope   . COPD (chronic obstructive pulmonary disease)   . Carotid bruit     Bilateral. Mild to moderate disease on LEFT and mild on the RIGHT in 2012  . Diabetes mellitus without complication     AODM  . Hyperlipidemia   . Intracerebral bleed 2003    Remote intracerebral blled and coiling by Dr. Estanislado Pandy for cerebral aneurysm ans a shunt placed by Dr. Vertell Limber remotely. No seizure disorder and this happened in 2003 with an anterior communicating aneurysm.  Marland Kitchen Cerebral aneurysm 2003  . Anxiety and depression     Hx of  . Stumbling gait     Stumbles a lot, but has not had any falls  . Breast cancer     s/p lumpectomy  . Type II or unspecified type diabetes mellitus without mention of complication, not stated as uncontrolled 03/18/2014  . Tobacco abuse 12/21/2013  . Shortness of breath 12/21/2013  . Other malaise and fatigue 03/18/2014  . Insomnia 03/18/2014  . Hypertension 03/18/2014  . History of fall 03/18/2014  . GERD (gastroesophageal reflux disease) 03/18/2014  . Flank pain 03/18/2014  . Edema 12/21/2013  . Depression due to head injury 03/18/2014  . Breast mass 03/18/2014  . Blepharospasm 03/18/2014  . Abnormality of gait 03/18/2014  . Sinus node dysfunction 12/21/2013  . Fuchs' corneal  dystrophy    Past Surgical History  Procedure Laterality Date  . Pacemaker insertion  07/12/2006    [employer] Victory XL DR  -  Dr. Tami Ribas  . Breast lumpectomy Right     For cancer, no recurrence  . Partial hysterectomy    . Cholecystectomy    . Aneurysm coiling  2003    Cerebral aneurysm ;Dr. Luanne Bras  . Ventriculoperitoneal shunt  2003    Dr. Erline Levine   Family History  Problem Relation Age of Onset  . Hypertension     History  Substance Use Topics  . Smoking status: Current Every Day Smoker -- 0.50 packs/day    Types: Cigarettes  . Smokeless tobacco: Not on file  . Alcohol Use: No   OB History   Grav Para Term Preterm Abortions TAB SAB Ect Mult Living                 Review of Systems  All other systems reviewed and are negative  Allergies  Review of patient's allergies indicates no known allergies.  Home Medications   Prior to Admission medications   Medication Sig Start Date End Date Taking? Authorizing Provider  aspirin 81 MG tablet Take 81 mg by mouth daily.   Yes Historical Provider, MD  atorvastatin (LIPITOR) 40 MG tablet Take 1 tablet (40 mg total) by mouth daily. 04/17/14  Yes Leonie Man, MD  clonazePAM Bobbye [place]) 1 MG tablet One twice daily to help control spasms at eyes 04/16/14  Yes Pricilla Larsson, NP  losartan (COZAAR) 50 MG tablet Take 50 mg by mouth daily.   Yes Historical Provider, MD  metFORMIN (GLUCOPHAGE) 500 MG tablet One with breakfast and one with supper to control diabetes 03/18/14  Yes Estill Dooms, MD  metoprolol (LOPRESSOR) 50 MG tablet Take 1 tablet (50 mg total) by mouth 2 (two) times daily. 01/27/14  Yes Thompson Grayer, MD  midodrine (PROAMATINE) 5 MG tablet Take 1 tablet (5 mg total) by mouth 3 (three) times daily. 07/04/13  Yes Rebecca Eaton, MD  mirtazapine (REMERON) 15 MG tablet Take 15 mg by mouth at bedtime as needed. For sleep   Yes Historical Provider, MD  pantoprazole (PROTONIX) 40 MG tablet Take 40 mg by mouth  daily.   Yes Historical Provider, MD  oxyCODONE-acetaminophen (PERCOCET/ROXICET) 5-325 MG per tablet Take 1 tablet by mouth once. 05/08/14   Dot Lanes, MD   BP 155/90  Pulse 73  Temp(Src) 98.3 F (36.8 C) (Oral)  Resp 18  SpO2 98% Physical Exam  Nursing note and vitals reviewed. Constitutional: She is oriented to person, place, and time. She appears well-developed and well-nourished. No distress.  HENT:  Head: Normocephalic.    Eyes: Pupils are equal, round, and reactive to light.  Neck: Normal range of motion.  Cardiovascular: Normal rate and intact distal pulses.   Pulmonary/Chest: No respiratory distress.  Abdominal: Normal appearance. She exhibits no distension.  Musculoskeletal: Normal range of motion.       Back:  Neurological: She is alert and oriented to person, place, and time. No cranial nerve deficit.  Skin: Skin is warm and dry. No rash noted.  Psychiatric: She has a normal mood and affect. Her behavior is normal.    ED Course  Procedures (including critical care time) Labs Review Labs Reviewed - No data to display  Imaging Review Dg Sacrum/coccyx  05/08/2014   CLINICAL DATA:  Pain after fall.  EXAM: SACRUM AND COCCYX - 2+ VIEW  COMPARISON:  None.  FINDINGS: No fracture or dislocation is noted. Sacroiliac joints appear normal.  IMPRESSION: Normal sacrum and coccyx.   Electronically Signed   By: Sabino Dick M.D.   On: 05/08/2014 22:20   Ct Head Wo Contrast  05/08/2014   CLINICAL DATA:  Fall today striking back of head.  Head pain.  EXAM: CT HEAD WITHOUT CONTRAST  TECHNIQUE: Contiguous axial images were obtained from the base of the skull through the vertex without intravenous contrast.  COMPARISON:  05/27/2007  FINDINGS: Left frontal approach ventriculostomy catheter remains in place terminating in the left frontal horn, unchanged. There is moderate dilatation of the lateral and third ventricles, stable to minimally increased compared to the prior CT. Sequelae  of prior anterior communicating artery aneurysm coiling are again identified. Partially calcified left parietal parafalcine mass measures 1.2 cm (previously 1.0 cm). Periventricular white matter hypodensities are stable to minimally increased from the prior study nonspecific but compatible with mild chronic small vessel ischemic disease. Focal hypodensity in the right corona radiata appears new, likely reflecting an interval but chronic lacunar infarct. There is no evidence of acute cortical infarct, intracranial hemorrhage, midline shift, or extra-axial fluid collection.  Moderate-sized parietal scalp hematoma is noted. Orbits are unremarkable. Mastoid air cells and visualized paranasal  sinuses are clear. No skull fracture is identified.  IMPRESSION: 1. Parietal scalp hematoma. No evidence of acute intracranial hemorrhage. 2. Stable to minimally increased ventricular dilatation. 3. Stable to minimally increased chronic small vessel ischemic disease. 4. New lacunar infarct in the right corona radiata, likely chronic.   Electronically Signed   By: Logan Bores   On: 05/08/2014 22:28     EKG Interpretation None     I discussed the CT report with Dr. Leonel Ramsay from neurology service.  He felt the patient was stable to be discharged in we'll continue with the aspirin and followup with her family physician. MDM   Final diagnoses:  Fall, initial encounter  Scalp hematoma, initial encounter        Dot Lanes, MD 05/10/14 1945

## 2014-05-08 NOTE — ED Notes (Signed)
Bed: BC48 Expected date:  Expected time:  Means of arrival:  Comments: EMS 73yo F fell off 3 ft wall; hematoma to head

## 2014-05-08 NOTE — Discharge Instructions (Signed)

## 2014-05-08 NOTE — ED Notes (Addendum)
Per EMS report: pt from home: Pt was standing on 2 feet tall brick wall when pt lost her balance and fell straight back.  Pt denies any LOC. Pt was immediately able to get up and walk to steps to sit down. Pt denies neck or back pain.  Pt c/o headache and buttock pain.  Pt a/o x 4. Pt hx falls d/t balance issues. Pt has a hematoma on the back of her head. Bleeding currently controlled.

## 2014-05-11 ENCOUNTER — Other Ambulatory Visit: Payer: Self-pay | Admitting: *Deleted

## 2014-05-11 MED ORDER — PANTOPRAZOLE SODIUM 40 MG PO TBEC: 40.0000 mg | DELAYED_RELEASE_TABLET | Freq: Every day | ORAL | Status: DC

## 2014-05-15 ENCOUNTER — Other Ambulatory Visit: Payer: Self-pay | Admitting: Internal Medicine

## 2014-05-19 NOTE — Telephone Encounter (Signed)
Encounter complete. 

## 2014-05-21 NOTE — Telephone Encounter (Signed)
Encounter complete. 

## 2014-06-02 ENCOUNTER — Other Ambulatory Visit: Payer: Self-pay | Admitting: Internal Medicine

## 2014-06-30 ENCOUNTER — Other Ambulatory Visit: Payer: Self-pay | Admitting: Internal Medicine

## 2014-07-05 ENCOUNTER — Other Ambulatory Visit: Payer: Self-pay | Admitting: Internal Medicine

## 2014-07-06 ENCOUNTER — Other Ambulatory Visit: Payer: Self-pay | Admitting: *Deleted

## 2014-07-07 ENCOUNTER — Encounter: Payer: Self-pay | Admitting: *Deleted

## 2014-07-15 ENCOUNTER — Other Ambulatory Visit: Payer: Self-pay | Admitting: *Deleted

## 2014-07-15 MED ORDER — MIDODRINE HCL 5 MG PO TABS: 5.0000 mg | ORAL_TABLET | Freq: Three times a day (TID) | ORAL | Status: DC

## 2014-07-15 NOTE — Telephone Encounter (Signed)
Rx refill sent to patient pharmacy   

## 2014-07-31 ENCOUNTER — Other Ambulatory Visit: Payer: Self-pay | Admitting: Internal Medicine

## 2014-08-02 ENCOUNTER — Other Ambulatory Visit: Payer: Self-pay | Admitting: Internal Medicine

## 2014-08-03 ENCOUNTER — Encounter: Payer: Self-pay | Admitting: *Deleted

## 2014-08-13 ENCOUNTER — Ambulatory Visit: Payer: Self-pay | Admitting: Nurse Practitioner

## 2014-08-14 ENCOUNTER — Other Ambulatory Visit: Payer: Self-pay | Admitting: Internal Medicine

## 2014-08-18 ENCOUNTER — Encounter: Payer: Self-pay | Admitting: *Deleted

## 2014-08-31 ENCOUNTER — Ambulatory Visit (INDEPENDENT_AMBULATORY_CARE_PROVIDER_SITE_OTHER): Payer: Medicare Other | Admitting: *Deleted

## 2014-08-31 DIAGNOSIS — I495 Sick sinus syndrome: Secondary | ICD-10-CM

## 2014-08-31 LAB — MDC_IDC_ENUM_SESS_TYPE_INCLINIC
Battery Impedance: 20400 Ohm
Battery Voltage: 2.64 V
Brady Statistic RA Percent Paced: 13 %
Brady Statistic RV Percent Paced: 1 %
Date Time Interrogation Session: 20151019164505
Implantable Pulse Generator Model: 5816
Implantable Pulse Generator Serial Number: 1724515
Lead Channel Impedance Value: 460 Ohm
Lead Channel Impedance Value: 721 Ohm
Lead Channel Pacing Threshold Amplitude: 0.5 V
Lead Channel Pacing Threshold Amplitude: 1.5 V
Lead Channel Pacing Threshold Pulse Width: 0.5 ms
Lead Channel Pacing Threshold Pulse Width: 0.8 ms
Lead Channel Sensing Intrinsic Amplitude: 12 mV
Lead Channel Sensing Intrinsic Amplitude: 4.4 mV
Lead Channel Setting Pacing Amplitude: 2 V
Lead Channel Setting Pacing Amplitude: 3 V
Lead Channel Setting Pacing Pulse Width: 0.8 ms
Lead Channel Setting Sensing Sensitivity: 2 mV

## 2014-08-31 NOTE — Progress Notes (Signed)
Pacemaker check in clinic. Normal device function. Thresholds, sensing, impedances consistent with previous measurements. Device programmed to maximize longevity. No mode switch or high ventricular rates noted. Device programmed at appropriate safety margins. Histogram distribution appropriate for patient activity level. Device programmed to optimize intrinsic conduction.  Patient education completed.  Device @ [facility] since 07/21/14.  ROV 10/30 with Dr. Rayann Heman to discuss change out.

## 2014-09-01 ENCOUNTER — Other Ambulatory Visit: Payer: Self-pay | Admitting: Internal Medicine

## 2014-09-02 ENCOUNTER — Other Ambulatory Visit: Payer: Self-pay | Admitting: Internal Medicine

## 2014-09-10 DIAGNOSIS — R55 Syncope and collapse: Secondary | ICD-10-CM | POA: Insufficient documentation

## 2014-09-11 ENCOUNTER — Ambulatory Visit (INDEPENDENT_AMBULATORY_CARE_PROVIDER_SITE_OTHER): Payer: Medicare Other | Admitting: Internal Medicine

## 2014-09-11 ENCOUNTER — Encounter: Payer: Self-pay | Admitting: *Deleted

## 2014-09-11 ENCOUNTER — Encounter: Payer: Self-pay | Admitting: Internal Medicine

## 2014-09-11 VITALS — BP 162/88 | HR 65 | Ht 71.0 in | Wt 161.4 lb

## 2014-09-11 DIAGNOSIS — I495 Sick sinus syndrome: Secondary | ICD-10-CM

## 2014-09-11 DIAGNOSIS — R55 Syncope and collapse: Secondary | ICD-10-CM

## 2014-09-11 DIAGNOSIS — Z720 Tobacco use: Secondary | ICD-10-CM

## 2014-09-11 DIAGNOSIS — I10 Essential (primary) hypertension: Secondary | ICD-10-CM

## 2014-09-11 DIAGNOSIS — Z950 Presence of cardiac pacemaker: Secondary | ICD-10-CM

## 2014-09-11 LAB — CBC WITH DIFFERENTIAL/PLATELET
Basophils Absolute: 0.1 10*3/uL (ref 0.0–0.1)
Basophils Relative: 0.5 % (ref 0.0–3.0)
Eosinophils Absolute: 0.1 10*3/uL (ref 0.0–0.7)
Eosinophils Relative: 0.5 % (ref 0.0–5.0)
HCT: 38.5 % (ref 36.0–46.0)
Hemoglobin: 12.7 g/dL (ref 12.0–15.0)
Lymphocytes Relative: 22.1 % (ref 12.0–46.0)
Lymphs Abs: 2.5 10*3/uL (ref 0.7–4.0)
MCHC: 33.1 g/dL (ref 30.0–36.0)
MCV: 87.7 fl (ref 78.0–100.0)
Monocytes Absolute: 0.5 10*3/uL (ref 0.1–1.0)
Monocytes Relative: 4.8 % (ref 3.0–12.0)
Neutro Abs: 8.3 10*3/uL — ABNORMAL HIGH (ref 1.4–7.7)
Neutrophils Relative %: 72.1 % (ref 43.0–77.0)
Platelets: 282 10*3/uL (ref 150.0–400.0)
RBC: 4.39 Mil/uL (ref 3.87–5.11)
RDW: 15.5 % (ref 11.5–15.5)
WBC: 11.5 10*3/uL — ABNORMAL HIGH (ref 4.0–10.5)

## 2014-09-11 LAB — BASIC METABOLIC PANEL
BUN: 9 mg/dL (ref 6–23)
CO2: 25 mEq/L (ref 19–32)
Calcium: 9.2 mg/dL (ref 8.4–10.5)
Chloride: 100 mEq/L (ref 96–112)
Creatinine, Ser: 0.6 mg/dL (ref 0.4–1.2)
GFR: 100.18 mL/min (ref >60.00)
Glucose, Bld: 88 mg/dL (ref 70–99)
Potassium: 4.2 mEq/L (ref 3.5–5.1)
Sodium: 131 mEq/L — ABNORMAL LOW (ref 135–145)

## 2014-09-11 NOTE — Progress Notes (Signed)
Primary Cardiologist:  Previously Dr Rollene Fare  Mary Higgins is a 73 y.o. female with a h/o neurocardiogenic syncope with rate drop/ bradycardia observed on tilt testing sp PPM (SJM) by Dr Tami Ribas who presents today to follow-up in the Electrophysiology device clinic.   The patient reports continues episodes of postural dizzines since having a pacemaker implanted.  She is not very active for her age.  She has rare episodes of syncope   She reports exertional SOB.  She says that she does not tolerate stress very well and is not ready to quit smoking.  Today, she  denies symptoms of palpitations, chest pain,  orthopnea, PND, lower extremity edema, dizziness,  or neurologic sequela.  The patientis tolerating medications without difficulties and is otherwise without complaint today.   Past Medical History  Diagnosis Date  . Cardiac pacemaker in situ 06/2006    Dr. Tami Ribas for sick sinus syndrome/ neurocardiogenic syncope demonstrated on tilt test  . Neurocardiogenic syncope   . COPD (chronic obstructive pulmonary disease)   . Carotid bruit     Bilateral. Mild to moderate disease on LEFT and mild on the RIGHT in 2012  . Diabetes mellitus without complication     AODM  . Hyperlipidemia   . Intracerebral bleed 2003    Remote intracerebral blled and coiling by Dr. Estanislado Pandy for cerebral aneurysm ans a shunt placed by Dr. Vertell Limber remotely. No seizure disorder and this happened in 2003 with an anterior communicating aneurysm.  Marland Kitchen Cerebral aneurysm 2003  . Anxiety and depression     Hx of  . Stumbling gait     Stumbles a lot, but has not had any falls  . Breast cancer     s/p lumpectomy  . Type II or unspecified type diabetes mellitus without mention of complication, not stated as uncontrolled 03/18/2014  . Tobacco abuse 12/21/2013  . Shortness of breath 12/21/2013  . Other malaise and fatigue 03/18/2014  . Insomnia 03/18/2014  . Hypertension 03/18/2014  . History of fall 03/18/2014  . GERD  (gastroesophageal reflux disease) 03/18/2014  . Flank pain 03/18/2014  . Edema 12/21/2013  . Depression due to head injury 03/18/2014  . Breast mass 03/18/2014  . Blepharospasm 03/18/2014  . Abnormality of gait 03/18/2014  . Sinus node dysfunction 12/21/2013  . Fuchs' corneal dystrophy    Past Surgical History  Procedure Laterality Date  . Pacemaker insertion  07/12/2006    [employer] Victory XL DR  -  Dr. Tami Ribas  . Breast lumpectomy Right     For cancer, no recurrence  . Partial hysterectomy    . Cholecystectomy    . Aneurysm coiling  2003    Cerebral aneurysm ;Dr. Luanne Bras  . Ventriculoperitoneal shunt  2003    Dr. Erline Levine    History   Social History  . Marital Status: Legally Separated    Spouse Name: N/A    Number of Children: N/A  . Years of Education: N/A   Occupational History  . Not on file.   Social History Main Topics  . Smoking status: Current Every Day Smoker -- 0.50 packs/day    Types: Cigarettes  . Smokeless tobacco: Not on file  . Alcohol Use: No  . Drug Use: No  . Sexual Activity: Not on file   Other Topics Concern  . Not on file   Social History Narrative  . No narrative on file    Family History  Problem Relation Age of Onset  .  Hypertension      No Known Allergies  Current Outpatient Prescriptions  Medication Sig Dispense Refill  . aspirin 81 MG tablet Take 81 mg by mouth daily.      Marland Kitchen atorvastatin (LIPITOR) 40 MG tablet Take 1 tablet (40 mg total) by mouth daily.  30 tablet  6  . clonazePAM (KLONOPIN) 1 MG tablet One twice daily to help control spasms at eyes  60 tablet  1  . losartan (COZAAR) 50 MG tablet TAKE 1 TABLET BY MOUTH EVERY DAY TO CONTROL BLOOD PRESSURE AND STRENGTHEN THE HEART  30 tablet  5  . metFORMIN (GLUCOPHAGE) 500 MG tablet TAKE 1 TABLET BY MOUTH WITH BREAKFAST AND 1 TABLET DAILY WITH SUPPER TO CONTROL DIABETES  60 tablet  0  . metoprolol (LOPRESSOR) 50 MG tablet TAKE 1 TABLET BY MOUTH TWICE DAILY  60 tablet  3  .  midodrine (PROAMATINE) 5 MG tablet Take 1 tablet (5 mg total) by mouth 3 (three) times daily.  90 tablet  8  . mirtazapine (REMERON) 15 MG tablet Take 15 mg by mouth at bedtime as needed. For sleep      . Multiple Vitamin (MULTIVITAMIN) capsule Take 1 capsule by mouth daily.      . pantoprazole (PROTONIX) 40 MG tablet TAKE 1 TABLET BY MOUTH EVERY DAY TO REDUCE STOMACH ACID  30 tablet  0   No current facility-administered medications for this visit.    ROS- all systems are reviewed and negative except as per HPI  Physical Exam: Filed Vitals:   09/11/14 1411  BP: 162/88  Pulse: 65  Height: 5\' 11"  (1.803 m)  Weight: 161 lb 6.4 oz (73.211 kg)    GEN- The patient is well appearing, alert and oriented x 3 today.   Head- normocephalic, atraumatic Eyes-  Sclera clear, conjunctiva pink Ears- hearing intact Oropharynx- clear Neck- supple,  Lungs- Clear to ausculation bilaterally with a prolonged expiratory phase, normal work of breathing Chest- pacemaker pocket is well healed Heart- Regular rate and rhythm, no murmurs, rubs or gallops, PMI not laterally displaced GI- soft, NT, ND, + BS Extremities- no clubbing, cyanosis, +1 edema MS- no significant deformity or atrophy Skin- no rash or lesion Psych- euthymic mood, full affect Neuro- strength and sensation are intact  Pacemaker interrogation- reviewed in detail today,  See PACEART report  Assessment and Plan:  1. Neurocardiogenic syncope/ sinus node dysfunction Normal pacemaker function See [name] report She has reached ERI battery status.  Risks, benefits, and alternatives to PPM generator change were dicsussed at length with the patient who wishes to proceed. I will plan for Biotronik for CLS technology at time of generator change. Support hose advised  2. Tobacco Cessation advised  3. HTN Stable No change required today

## 2014-09-11 NOTE — Patient Instructions (Addendum)
Your physician has recommended that you have a pacemaker inserted. A pacemaker is a small device that is placed under the skin of your chest or abdomen to help control abnormal heart rhythms. This device uses electrical pulses to prompt the heart to beat at a normal rate. Pacemakers are used to treat heart rhythms that are too slow. Wire (leads) are attached to the pacemaker that goes into the chambers of you heart. This is done in the hospital and usually requires and overnight stay. Please see the instruction sheet given to you today for more information.  See instruction sheet for procedure  Your physician recommends that you schedule a follow-up appointment in: 7-10 days from 09/25/14 with device clinic for wound check  Your physician recommends that you return for lab work today: BMP/CBC

## 2014-09-16 ENCOUNTER — Encounter: Payer: Self-pay | Admitting: Internal Medicine

## 2014-09-24 DIAGNOSIS — K219 Gastro-esophageal reflux disease without esophagitis: Secondary | ICD-10-CM | POA: Diagnosis not present

## 2014-09-24 DIAGNOSIS — Z950 Presence of cardiac pacemaker: Secondary | ICD-10-CM | POA: Diagnosis not present

## 2014-09-24 DIAGNOSIS — E785 Hyperlipidemia, unspecified: Secondary | ICD-10-CM | POA: Diagnosis not present

## 2014-09-24 DIAGNOSIS — J449 Chronic obstructive pulmonary disease, unspecified: Secondary | ICD-10-CM | POA: Diagnosis not present

## 2014-09-24 DIAGNOSIS — F329 Major depressive disorder, single episode, unspecified: Secondary | ICD-10-CM | POA: Diagnosis not present

## 2014-09-24 DIAGNOSIS — Z7982 Long term (current) use of aspirin: Secondary | ICD-10-CM | POA: Diagnosis not present

## 2014-09-24 DIAGNOSIS — I495 Sick sinus syndrome: Secondary | ICD-10-CM | POA: Diagnosis not present

## 2014-09-24 DIAGNOSIS — Z853 Personal history of malignant neoplasm of breast: Secondary | ICD-10-CM | POA: Diagnosis not present

## 2014-09-24 DIAGNOSIS — F419 Anxiety disorder, unspecified: Secondary | ICD-10-CM | POA: Diagnosis not present

## 2014-09-24 DIAGNOSIS — I10 Essential (primary) hypertension: Secondary | ICD-10-CM | POA: Diagnosis not present

## 2014-09-24 DIAGNOSIS — F17210 Nicotine dependence, cigarettes, uncomplicated: Secondary | ICD-10-CM | POA: Diagnosis not present

## 2014-09-24 DIAGNOSIS — E119 Type 2 diabetes mellitus without complications: Secondary | ICD-10-CM | POA: Diagnosis not present

## 2014-09-24 MED ORDER — SODIUM CHLORIDE 0.9 % IR SOLN
80.0000 mg | Status: AC
  Filled 2014-09-24: qty 2

## 2014-09-24 MED ORDER — CEFAZOLIN SODIUM-DEXTROSE 2-3 GM-% IV SOLR: 2.0000 g | INTRAVENOUS | Status: AC

## 2014-09-25 ENCOUNTER — Encounter (HOSPITAL_COMMUNITY)
Admission: RE | Disposition: A | Discharge status: Home / Self Care | Payer: Self-pay | Source: Ambulatory Visit | Attending: Internal Medicine

## 2014-09-25 ENCOUNTER — Ambulatory Visit (HOSPITAL_COMMUNITY)
Admission: RE | Admit: 2014-09-25 | Discharge: 2014-09-25 | Disposition: A | Discharge status: Home / Self Care | Payer: Medicare Other | Source: Ambulatory Visit | Attending: Internal Medicine | Admitting: Internal Medicine

## 2014-09-25 DIAGNOSIS — R55 Syncope and collapse: Secondary | ICD-10-CM | POA: Diagnosis present

## 2014-09-25 DIAGNOSIS — Z853 Personal history of malignant neoplasm of breast: Secondary | ICD-10-CM | POA: Insufficient documentation

## 2014-09-25 DIAGNOSIS — I495 Sick sinus syndrome: Secondary | ICD-10-CM

## 2014-09-25 DIAGNOSIS — E119 Type 2 diabetes mellitus without complications: Secondary | ICD-10-CM | POA: Insufficient documentation

## 2014-09-25 DIAGNOSIS — J449 Chronic obstructive pulmonary disease, unspecified: Secondary | ICD-10-CM | POA: Insufficient documentation

## 2014-09-25 DIAGNOSIS — F329 Major depressive disorder, single episode, unspecified: Secondary | ICD-10-CM | POA: Insufficient documentation

## 2014-09-25 DIAGNOSIS — F17210 Nicotine dependence, cigarettes, uncomplicated: Secondary | ICD-10-CM | POA: Insufficient documentation

## 2014-09-25 DIAGNOSIS — F419 Anxiety disorder, unspecified: Secondary | ICD-10-CM | POA: Insufficient documentation

## 2014-09-25 DIAGNOSIS — E785 Hyperlipidemia, unspecified: Secondary | ICD-10-CM | POA: Insufficient documentation

## 2014-09-25 DIAGNOSIS — I10 Essential (primary) hypertension: Secondary | ICD-10-CM | POA: Insufficient documentation

## 2014-09-25 DIAGNOSIS — K219 Gastro-esophageal reflux disease without esophagitis: Secondary | ICD-10-CM | POA: Insufficient documentation

## 2014-09-25 DIAGNOSIS — Z7982 Long term (current) use of aspirin: Secondary | ICD-10-CM | POA: Insufficient documentation

## 2014-09-25 DIAGNOSIS — Z950 Presence of cardiac pacemaker: Secondary | ICD-10-CM

## 2014-09-25 HISTORY — PX: PACEMAKER GENERATOR CHANGE: SHX5481

## 2014-09-25 LAB — GLUCOSE, CAPILLARY
Glucose-Capillary: 90 mg/dL (ref 70–99)
Glucose-Capillary: 66 mg/dL — ABNORMAL LOW (ref 70–99)
Glucose-Capillary: 82 mg/dL (ref 70–99)

## 2014-09-25 LAB — SURGICAL PCR SCREEN
MRSA, PCR: NEGATIVE
Staphylococcus aureus: NEGATIVE

## 2014-09-25 SURGERY — PACEMAKER GENERATOR CHANGE
Anesthesia: LOCAL

## 2014-09-25 MED ORDER — MIDAZOLAM HCL 5 MG/5ML IJ SOLN
INTRAMUSCULAR | Status: AC
  Filled 2014-09-25: qty 5

## 2014-09-25 MED ORDER — HYDROCODONE-ACETAMINOPHEN 5-325 MG PO TABS
ORAL_TABLET | ORAL | Status: AC
  Filled 2014-09-25: qty 2

## 2014-09-25 MED ORDER — MUPIROCIN 2 % EX OINT
TOPICAL_OINTMENT | CUTANEOUS | Status: AC
  Filled 2014-09-25: qty 22

## 2014-09-25 MED ORDER — SODIUM CHLORIDE 0.9 % IV SOLN: INTRAVENOUS | Status: DC

## 2014-09-25 MED ORDER — FENTANYL CITRATE 0.05 MG/ML IJ SOLN
INTRAMUSCULAR | Status: AC
  Filled 2014-09-25: qty 2

## 2014-09-25 MED ORDER — MUPIROCIN 2 % EX OINT: 1.0000 "application " | TOPICAL_OINTMENT | Freq: Once | CUTANEOUS | Status: DC

## 2014-09-25 MED ORDER — LIDOCAINE HCL (PF) 1 % IJ SOLN
INTRAMUSCULAR | Status: AC
  Filled 2014-09-25: qty 30

## 2014-09-25 MED ORDER — CHLORHEXIDINE GLUCONATE 4 % EX LIQD
60.0000 mL | Freq: Once | CUTANEOUS | Status: DC
  Filled 2014-09-25: qty 60

## 2014-09-25 MED ORDER — HYDROCODONE-ACETAMINOPHEN 5-325 MG PO TABS
1.0000 | ORAL_TABLET | ORAL | Status: DC | PRN
  Administered 2014-09-25: 2 via ORAL
  Filled 2014-09-25: qty 2

## 2014-09-25 NOTE — Interval H&P Note (Signed)
History and Physical Interval Note:  09/25/2014 11:17 AM  [place]  has presented today for surgery, with the diagnosis of battery depletion  The various methods of treatment have been discussed with the patient and family. After consideration of risks, benefits and other options for treatment, the patient has consented to  Procedure(s): PACEMAKER GENERATOR CHANGE (N/A) as a surgical intervention .  The patient's history has been reviewed, patient examined, no change in status, stable for surgery.  I have reviewed the patient's chart and labs.  Questions were answered to the patient's satisfaction.     Thompson Grayer

## 2014-09-25 NOTE — Progress Notes (Signed)
CBG 66  Asymptomatic. 8oz orange juice po

## 2014-09-25 NOTE — Progress Notes (Signed)
Cbg82.   Pt  taken to car via wheelchair.

## 2014-09-25 NOTE — H&P (View-Only) (Signed)
Primary Cardiologist:  Previously Dr Mary Higgins  Mary Higgins Mary Higgins is a 73 y.o. female with a h/o neurocardiogenic syncope with rate drop/ bradycardia observed on tilt testing sp PPM (SJM) by Dr Mary Higgins who presents today to follow-up in the Electrophysiology device clinic.   The patient reports continues episodes of postural dizzines since having a pacemaker implanted.  She is not very active for her age.  She has rare episodes of syncope   She reports exertional SOB.  She says that she does not tolerate stress very well and is not ready to quit smoking.  Today, she  denies symptoms of palpitations, chest pain,  orthopnea, PND, lower extremity edema, dizziness,  or neurologic sequela.  The patientis tolerating medications without difficulties and is otherwise without complaint today.   Past Medical History  Diagnosis Date  . Cardiac pacemaker in situ 06/2006    Dr. Tami Higgins for sick sinus syndrome/ neurocardiogenic syncope demonstrated on tilt test  . Neurocardiogenic syncope   . COPD (chronic obstructive pulmonary disease)   . Carotid bruit     Bilateral. Mild to moderate disease on LEFT and mild on the RIGHT in 2012  . Diabetes mellitus without complication     AODM  . Hyperlipidemia   . Intracerebral bleed 2003    Remote intracerebral blled and coiling by Dr. Estanislado Higgins for cerebral aneurysm ans a shunt placed by Dr. Vertell Higgins remotely. No seizure disorder and this happened in 2003 with an anterior communicating aneurysm.  Marland Kitchen Cerebral aneurysm 2003  . Anxiety and depression     Hx of  . Stumbling gait     Stumbles a lot, but has not had any falls  . Breast cancer     s/p lumpectomy  . Type II or unspecified type diabetes mellitus without mention of complication, not stated as uncontrolled 03/18/2014  . Tobacco abuse 12/21/2013  . Shortness of breath 12/21/2013  . Other malaise and fatigue 03/18/2014  . Insomnia 03/18/2014  . Hypertension 03/18/2014  . History of fall 03/18/2014  . GERD  (gastroesophageal reflux disease) 03/18/2014  . Flank pain 03/18/2014  . Edema 12/21/2013  . Depression due to head injury 03/18/2014  . Breast mass 03/18/2014  . Blepharospasm 03/18/2014  . Abnormality of gait 03/18/2014  . Sinus node dysfunction 12/21/2013  . Fuchs' corneal dystrophy    Past Surgical History  Procedure Laterality Date  . Pacemaker insertion  07/12/2006    [employer] Victory XL DR  -  Dr. Tami Higgins  . Breast lumpectomy Right     For cancer, no recurrence  . Partial hysterectomy    . Cholecystectomy    . Aneurysm coiling  2003    Cerebral aneurysm ;Dr. Luanne Higgins  . Ventriculoperitoneal shunt  2003    Dr. Erline Higgins    History   Social History  . Marital Status: Legally Separated    Spouse Name: N/A    Number of Children: N/A  . Years of Education: N/A   Occupational History  . Not on file.   Social History Main Topics  . Smoking status: Current Every Day Smoker -- 0.50 packs/day    Types: Cigarettes  . Smokeless tobacco: Not on file  . Alcohol Use: No  . Drug Use: No  . Sexual Activity: Not on file   Other Topics Concern  . Not on file   Social History Narrative  . No narrative on file    Family History  Problem Relation Age of Onset  .  Hypertension      No Known Allergies  Current Outpatient Prescriptions  Medication Sig Dispense Refill  . aspirin 81 MG tablet Take 81 mg by mouth daily.      Marland Kitchen atorvastatin (LIPITOR) 40 MG tablet Take 1 tablet (40 mg total) by mouth daily.  30 tablet  6  . clonazePAM (KLONOPIN) 1 MG tablet One twice daily to help control spasms at eyes  60 tablet  1  . losartan (COZAAR) 50 MG tablet TAKE 1 TABLET BY MOUTH EVERY DAY TO CONTROL BLOOD PRESSURE AND STRENGTHEN THE HEART  30 tablet  5  . metFORMIN (GLUCOPHAGE) 500 MG tablet TAKE 1 TABLET BY MOUTH WITH BREAKFAST AND 1 TABLET DAILY WITH SUPPER TO CONTROL DIABETES  60 tablet  0  . metoprolol (LOPRESSOR) 50 MG tablet TAKE 1 TABLET BY MOUTH TWICE DAILY  60 tablet  3  .  midodrine (PROAMATINE) 5 MG tablet Take 1 tablet (5 mg total) by mouth 3 (three) times daily.  90 tablet  8  . mirtazapine (REMERON) 15 MG tablet Take 15 mg by mouth at bedtime as needed. For sleep      . Multiple Vitamin (MULTIVITAMIN) capsule Take 1 capsule by mouth daily.      . pantoprazole (PROTONIX) 40 MG tablet TAKE 1 TABLET BY MOUTH EVERY DAY TO REDUCE STOMACH ACID  30 tablet  0   No current facility-administered medications for this visit.    ROS- all systems are reviewed and negative except as per HPI  Physical Exam: Filed Vitals:   09/11/14 1411  BP: 162/88  Pulse: 65  Height: 5\' 11"  (1.803 m)  Weight: 161 lb 6.4 oz (73.211 kg)    GEN- The patient is well appearing, alert and oriented x 3 today.   Head- normocephalic, atraumatic Eyes-  Sclera clear, conjunctiva pink Ears- hearing intact Oropharynx- clear Neck- supple,  Lungs- Clear to ausculation bilaterally with a prolonged expiratory phase, normal work of breathing Chest- pacemaker pocket is well healed Heart- Regular rate and rhythm, no murmurs, rubs or gallops, PMI not laterally displaced GI- soft, NT, ND, + BS Extremities- no clubbing, cyanosis, +1 edema MS- no significant deformity or atrophy Skin- no rash or lesion Psych- euthymic mood, full affect Neuro- strength and sensation are intact  Pacemaker interrogation- reviewed in detail today,  See PACEART report  Assessment and Plan:  1. Neurocardiogenic syncope/ sinus node dysfunction Normal pacemaker function See Mary Higgins Art report She has reached ERI battery status.  Risks, benefits, and alternatives to PPM generator change were dicsussed at length with the patient who wishes to proceed. I will plan for Biotronik for CLS technology at time of generator change. Support hose advised  2. Tobacco Cessation advised  3. HTN Stable No change required today

## 2014-09-25 NOTE — Progress Notes (Signed)
Post care instructions printed and given to pt's son.

## 2014-09-25 NOTE — Op Note (Signed)
SURGEON:  Thompson Grayer, MD     PREPROCEDURE DIAGNOSES:   1. Sick sinus syndrome.   2. Dysautonomia with syncope    POSTPROCEDURE DIAGNOSES:   1. Sick sinus syndrome.   2. Dysautonomia with syncope    PROCEDURES:   1. Pacemaker pulse generator replacement.   2. Skin pocket revision.      INTRODUCTION:  Mary Higgins is a 73 y.o. female with a history of sick sinus syndrome.  She has dysautonomia also.  She is s/p PPM implant by Dr Tami Ribas. The patient has done well since her pacemaker was implanted.  The patient  has recently reached ERI battery status.  The patient presents today for pacemaker pulse generator replacement.       DESCRIPTION OF THE PROCEDURE:  Informed written consent was obtained, and the patient was brought to the electrophysiology lab in the fasting state.  The patient's pacemaker was interrogated today and found to be at elective replacement indicator battery status.  The patient required no sedation for the procedure today.  The patient's right chest was prepped and draped in the usual sterile fashion by the EP lab staff.  The skin overlying the existing pacemaker was infiltrated with lidocaine for local analgesia.  A 4-cm incision was made over the pacemaker pocket.  Using a combination of sharp and blunt dissection, the pacemaker was exposed and removed from the body.  The device was disconnected from the leads. A single silk suture was identified and removed which had secured the device to the pectoralis fascia.  There was no foreign matter or debris within the pocket.  The atrial lead was confirmed to be a [place] [id number] (serial number [id number]) lead implanted on 07/12/2006.  The right ventricular lead was confirmed to be a [place] (serial number [id number]) lead implanted on the same date as the atrial lead (above).  Both leads were examined and their integrity was confirmed to be intact.  Atrial lead P-waves measured 2.3 mV with impedance  of 565 ohms and a threshold of 0.8 V at 0.5 msec.  Right ventricular lead R-waves measured 10 mV with impedance of 487 ohms and a threshold of 1.2 V at 1.0 msec.  Her RV threshold is chronically elevated and stable.  As she does not VA pace, I elected to not perform lead revision today.  Both leads were connected to a Biotronik Etrinsa 8 DR-T (serial number [id number]) pacemaker.  The pocket was revised to accommodate this new device.  Electrocautery was required to assure hemostasis.  The pocket was irrigated with copious gentamicin solution. The pacemaker was then placed into the pocket.  The pocket was then closed in 2 layers with 2-0 Vicryl suture over the subcutaneous and subcuticular layers.  Steri-Strips and a sterile dressing were then applied.EBL<35ml. There were no early apparent complications.     CONCLUSIONS:   1. Successful pacemaker pulse generator replacement for elective replacement indicator battery status. Sick sinus syndrome/ dysautonomia with syncope   2. No early apparent complications.     Thompson Grayer, MD 09/25/2014 12:08 PM

## 2014-09-25 NOTE — Interval H&P Note (Signed)
History and Physical Interval Note:  09/25/2014 11:17 AM  [place]  has presented today for surgery, with the diagnosis of battery depletion  The various methods of treatment have been discussed with the patient and family. After consideration of risks, benefits and other options for treatment, the patient has consented to  Procedure(s): PACEMAKER GENERATOR CHANGE (N/A) as a surgical intervention .  The patient's history has been reviewed, patient examined, no change in status, stable for surgery.  I have reviewed the patient's chart and labs.  Questions were answered to the patient's satisfaction.     Thompson Grayer

## 2014-09-29 ENCOUNTER — Other Ambulatory Visit: Payer: Self-pay | Admitting: Internal Medicine

## 2014-10-04 ENCOUNTER — Other Ambulatory Visit: Payer: Self-pay | Admitting: Internal Medicine

## 2014-10-04 ENCOUNTER — Encounter: Payer: Self-pay | Admitting: Internal Medicine

## 2014-10-05 ENCOUNTER — Ambulatory Visit: Payer: Medicare Other

## 2014-10-22 ENCOUNTER — Encounter (HOSPITAL_COMMUNITY): Payer: Self-pay | Admitting: Internal Medicine

## 2014-10-30 ENCOUNTER — Other Ambulatory Visit: Payer: Self-pay | Admitting: Internal Medicine

## 2014-11-30 ENCOUNTER — Other Ambulatory Visit: Payer: Self-pay | Admitting: Cardiology

## 2014-11-30 ENCOUNTER — Other Ambulatory Visit: Payer: Self-pay | Admitting: *Deleted

## 2014-11-30 ENCOUNTER — Other Ambulatory Visit: Payer: Self-pay | Admitting: Internal Medicine

## 2014-11-30 MED ORDER — METFORMIN HCL 500 MG PO TABS: ORAL_TABLET | ORAL | Status: DC

## 2014-11-30 NOTE — Telephone Encounter (Signed)
[facility].  

## 2014-12-06 ENCOUNTER — Other Ambulatory Visit: Payer: Self-pay | Admitting: Internal Medicine

## 2015-01-01 ENCOUNTER — Other Ambulatory Visit: Payer: Self-pay | Admitting: Cardiology

## 2015-01-01 ENCOUNTER — Other Ambulatory Visit: Payer: Self-pay | Admitting: Internal Medicine

## 2015-01-01 ENCOUNTER — Other Ambulatory Visit: Payer: Self-pay

## 2015-01-06 ENCOUNTER — Encounter: Payer: Self-pay | Admitting: *Deleted

## 2015-01-06 ENCOUNTER — Ambulatory Visit: Payer: Self-pay | Admitting: Internal Medicine

## 2015-01-13 ENCOUNTER — Ambulatory Visit: Payer: Self-pay | Admitting: Internal Medicine

## 2015-01-15 ENCOUNTER — Other Ambulatory Visit: Payer: Self-pay | Admitting: Cardiology

## 2015-01-15 NOTE — Telephone Encounter (Signed)
Rx has been sent to the pharmacy electronically. ° °

## 2015-01-20 ENCOUNTER — Ambulatory Visit (INDEPENDENT_AMBULATORY_CARE_PROVIDER_SITE_OTHER): Payer: Medicare Other | Admitting: Internal Medicine

## 2015-01-20 ENCOUNTER — Encounter: Payer: Self-pay | Admitting: Internal Medicine

## 2015-01-20 VITALS — BP 146/92 | HR 54 | Temp 97.4°F | Ht 71.0 in | Wt 162.4 lb

## 2015-01-20 DIAGNOSIS — R269 Unspecified abnormalities of gait and mobility: Secondary | ICD-10-CM | POA: Diagnosis not present

## 2015-01-20 DIAGNOSIS — R0602 Shortness of breath: Secondary | ICD-10-CM | POA: Diagnosis not present

## 2015-01-20 DIAGNOSIS — I10 Essential (primary) hypertension: Secondary | ICD-10-CM | POA: Diagnosis not present

## 2015-01-20 DIAGNOSIS — G4700 Insomnia, unspecified: Secondary | ICD-10-CM | POA: Diagnosis not present

## 2015-01-20 DIAGNOSIS — F329 Major depressive disorder, single episode, unspecified: Secondary | ICD-10-CM

## 2015-01-20 DIAGNOSIS — E119 Type 2 diabetes mellitus without complications: Secondary | ICD-10-CM | POA: Diagnosis not present

## 2015-01-20 DIAGNOSIS — R609 Edema, unspecified: Secondary | ICD-10-CM | POA: Diagnosis not present

## 2015-01-20 DIAGNOSIS — G245 Blepharospasm: Secondary | ICD-10-CM | POA: Diagnosis not present

## 2015-01-20 DIAGNOSIS — K219 Gastro-esophageal reflux disease without esophagitis: Secondary | ICD-10-CM | POA: Diagnosis not present

## 2015-01-20 DIAGNOSIS — B351 Tinea unguium: Secondary | ICD-10-CM | POA: Insufficient documentation

## 2015-01-20 DIAGNOSIS — E785 Hyperlipidemia, unspecified: Secondary | ICD-10-CM | POA: Diagnosis not present

## 2015-01-20 DIAGNOSIS — S0990XA Unspecified injury of head, initial encounter: Secondary | ICD-10-CM

## 2015-01-20 DIAGNOSIS — Z9181 History of falling: Secondary | ICD-10-CM

## 2015-01-20 DIAGNOSIS — F32A Depression, unspecified: Secondary | ICD-10-CM

## 2015-01-20 MED ORDER — MICONAZOLE NITRATE 2 % EX SOLN: CUTANEOUS | Status: DC

## 2015-01-20 MED ORDER — CLONAZEPAM 1 MG PO TABS: ORAL_TABLET | ORAL | Status: DC

## 2015-01-20 MED ORDER — TEMAZEPAM 15 MG PO CAPS: 15.0000 mg | ORAL_CAPSULE | Freq: Every evening | ORAL | Status: DC | PRN

## 2015-01-20 MED ORDER — LOSARTAN POTASSIUM 100 MG PO TABS: 100.0000 mg | ORAL_TABLET | Freq: Every day | ORAL | Status: DC

## 2015-01-20 MED ORDER — CLOTRIMAZOLE 1 % EX SOLN: CUTANEOUS | Status: DC

## 2015-01-20 MED ORDER — HYDROCODONE-ACETAMINOPHEN 5-325 MG PO TABS: ORAL_TABLET | ORAL | Status: DC

## 2015-01-20 NOTE — Progress Notes (Signed)
Patient ID: Mary Higgins, female   DOB: [birth year], 74 y.o.   MRN: [id number]    Facility  [facility]    Place of Service:   OFFICE   No Known Allergies  Chief Complaint  Patient presents with  . Medical Management of Chronic Issues    Follow up no complaints.    HPI:   Blepharospasm - Has had Eye doctors at [facility] treating her for blepharospasm. Using Botox and Klonopin. Claims that the doctors at [facility] on see her every 2.5 months. She doesn't think she needs to go that often. These trips or for injection of the Botox. She claims her insurance will not pay for Botox injections this frequently. They ride to [place] is hard for her.   Depression due to head injury - patient has been depressed in the past is not currently taking any medication other than Remeron. This drug was started to help her rest and she says it does not do that.  Onychomycosis - would like a medication to treat the fungal infection of her toenails. Painless.  Gastroesophageal reflux disease without esophagitis: Occasional reflux with indigestion.  Essential hypertension - mild elevations in SBP and DBP today. Patient says blood pressures at home are normal.   Shortness of breath: Mild to moderate on exertion. Denies problems with breathing at night.  Edema: Trace bilaterally  Abnormality of gait: Unsteady since her head injury.  History of fall - no recent falls   Type 2 diabetes mellitus without complication - controlled  Insomnia: Because the Remeron is not helping her, she would like to try a different medication.  Hyperlipidemia - no recent lab done.    Medications: Patient's Medications  New Prescriptions   No medications on file  Previous Medications   ASPIRIN 81 MG TABLET    Take 81 mg by mouth daily.   ATORVASTATIN (LIPITOR) 40 MG TABLET    TAKE 1 TABLET BY MOUTH EVERY DAY   BACITRACIN-POLYMYXIN B (POLYSPORIN) OPHTHALMIC OINTMENT       CLONAZEPAM (KLONOPIN) 1 MG TABLET    One twice daily to  help control spasms at eyes   LOSARTAN (COZAAR) 50 MG TABLET    TAKE 1 TABLET BY MOUTH EVERY DAY TO CONTROL BLOOD PRESSURE AND STRENGTHEN THE HEART   LOSARTAN (COZAAR) 50 MG TABLET    Take 50 mg by mouth daily.   METFORMIN (GLUCOPHAGE) 500 MG TABLET    TAKE 1 TABLET BY MOUTH WITH BREAKFAST AND 1 TABLET WITH SUPPER TO CONTROL DIABETES   METOPROLOL (LOPRESSOR) 50 MG TABLET    TAKE 1 TABLET BY MOUTH TWICE DAILY   MIDODRINE (PROAMATINE) 5 MG TABLET    Take 1 tablet (5 mg total) by mouth 3 (three) times daily.   MIRTAZAPINE (REMERON) 15 MG TABLET    TAKE 1 TABLET BY MOUTH AT BEDTIME TO HELP NERVES AND REST   MULTIPLE VITAMIN (MULTIVITAMIN) CAPSULE    Take 1 capsule by mouth daily.   PANTOPRAZOLE (PROTONIX) 40 MG TABLET    TAKE 1 TABLET BY MOUTH EVERY DAY TO REDUCE STOMACH ACID   VICODIN 5-300 MG TABS      Modified Medications   No medications on file  Discontinued Medications   METFORMIN (GLUCOPHAGE) 500 MG TABLET    Take 500 mg by mouth 2 (two) times daily with a meal.   METOPROLOL (LOPRESSOR) 50 MG TABLET    Take 50 mg by mouth 2 (two) times daily.   MIRTAZAPINE (REMERON) 15 MG TABLET  Take 15 mg by mouth daily as needed (for sleep). For sleep   PANTOPRAZOLE (PROTONIX) 40 MG TABLET    Take 40 mg by mouth daily.     Review of Systems  Constitutional: Positive for appetite change (Decreased) and fatigue.  HENT: Positive for hearing loss.        Dentures  Eyes: Positive for visual disturbance (Corrective lenses).       Xerophthalmia  Respiratory: Negative for cough and shortness of breath.   Cardiovascular: Positive for palpitations and leg swelling. Negative for chest pain.  Gastrointestinal: Negative for abdominal pain, abdominal distention and anal bleeding.       Hemorrhoids  Endocrine: Negative.        History of diabetes  Genitourinary: Negative.   Musculoskeletal: Positive for gait problem.       History of falls and gait instability.  Skin: Negative.   Allergic/Immunologic:  Negative.   Neurological: Positive for dizziness.       History of neurocardiogenic syncope. Memory disorder. History of intracerebral bleed in 2003. Subsequently had a small aneurysm treated by Dr. Estanislado Pandy with a coil. History of shunt to the brain in the left parietal area placed by Dr. Vertell Limber, neurosurgeon in 2003.  Psychiatric/Behavioral: Negative.     Filed Vitals:   01/20/15 1131  BP: 146/92  Pulse: 54  Temp: 97.4 F (36.3 C)  TempSrc: Oral  Height: 5\' 11"  (1.803 m)  Weight: 162 lb 6.4 oz (73.664 kg)   Body mass index is 22.66 kg/(m^2).  Physical Exam  Constitutional: She is oriented to person, place, and time. She appears well-developed and well-nourished. No distress.  HENT:  Right Ear: External ear normal.  Left Ear: External ear normal.  Nose: Nose normal.  Mouth/Throat: Oropharynx is clear and moist. No oropharyngeal exudate.  Ventriculoperitoneal shunt. Upper dentures  Eyes: Conjunctivae and EOM are normal. Pupils are equal, round, and reactive to light. No scleral icterus.  Bilateral  Neck: No JVD present. No tracheal deviation present. No thyromegaly present.  Cardiovascular: Normal rate, regular rhythm, normal heart sounds and intact distal pulses.  Exam reveals no gallop and no friction rub.   No murmur heard. Pulmonary/Chest: Effort normal. No respiratory distress. She has no wheezes. She has no rales. She exhibits no tenderness.  Pacemaker right upper chest Atrophic breasts. Breasts are previous breast cancer surgery  Abdominal: She exhibits no distension and no mass. There is no tenderness.  Genitourinary:  History of hemorrhoids  Musculoskeletal: Normal range of motion. She exhibits edema (Approximately 1+ bilaterally in lower legs and ankles). She exhibits no tenderness.  Lymphadenopathy:    She has no cervical adenopathy.  Neurological: She is alert and oriented to person, place, and time. No cranial nerve deficit. Coordination normal.  Skin: No  rash noted. She is not diaphoretic. No erythema. No pallor.  Psychiatric: She has a normal mood and affect. Her behavior is normal. Judgment and thought content normal.     Labs reviewed: No visits with results within 3 Month(s) from this visit. Latest known visit with results is:  Admission on 09/25/2014, Discharged on 09/25/2014  Component Date Value Ref Range Status  . MRSA, PCR 09/25/2014 NEGATIVE  NEGATIVE Final  . Staphylococcus aureus 09/25/2014 NEGATIVE  NEGATIVE Final   Comment:        The Xpert SA Assay (FDA approved for NASAL specimens in patients over 71 years of age), is one component of a comprehensive surveillance program.  Test performance has been validated by [employer]  for patients greater than or equal to 43 year old. It is not intended to diagnose infection nor to guide or monitor treatment.   . Glucose-Capillary 09/25/2014 90  70 - 99 mg/dL Final  . Glucose-Capillary 09/25/2014 66* 70 - 99 mg/dL Final  . Glucose-Capillary 09/25/2014 82  70 - 99 mg/dL Final     Assessment/Plan  1. Blepharospasm - clonazePAM (KLONOPIN) 1 MG tablet; One twice daily to help control spasms at eyes  Dispense: 60 tablet; Refill: 1  2. Depression due to head injury Continue to be observant for signs of depression. She does not feel she needs medication - TSH; Future  3. Onychomycosis - clotrimazole (LOTRIMIN) 1 % external solution; Apply daily to affected nails  Dispense: 30 mL; Refill: 3  4. Gastroesophageal reflux disease without esophagitis Mild stable  5. Essential hypertension Mild elevation in SBP and DBP today. We will increase her losartan dose. - Comprehensive metabolic panel; Future - losartan (COZAAR) 100 MG tablet; Take 1 tablet (100 mg total) by mouth daily.  Dispense: 90 tablet; Refill: 3  6. Shortness of breath Observe. No new orders.  7. Edema Observe.  8. Abnormality of gait Unchanged  9. History of fall None recently -  HYDROcodone-acetaminophen (NORCO/VICODIN) 5-325 MG per tablet; Take 1 tablet every 6 hours as needed for pain  Dispense: 100 tablet; Refill: 0  10. Type 2 diabetes mellitus without complication - Hemoglobin A1c; Future  11. Insomnia - TSH; Future - temazepam (RESTORIL) 15 MG capsule; Take 1 capsule (15 mg total) by mouth at bedtime as needed for sleep.  Dispense: 30 capsule; Refill: 0  12. Hyperlipidemia - Lipid panel; Future

## 2015-01-21 LAB — LIPID PANEL
Chol/HDL Ratio: 2.4 ratio units (ref 0.0–4.4)
Cholesterol, Total: 121 mg/dL (ref 100–199)
HDL: 50 mg/dL (ref >39)
LDL Calculated: 57 mg/dL (ref 0–99)
Triglycerides: 72 mg/dL (ref 0–149)
VLDL Cholesterol Cal: 14 mg/dL (ref 5–40)

## 2015-01-21 LAB — COMPREHENSIVE METABOLIC PANEL
ALT: 8 IU/L (ref 0–32)
AST: 11 IU/L (ref 0–40)
Albumin/Globulin Ratio: 2.3 (ref 1.1–2.5)
Albumin: 4.5 g/dL (ref 3.5–4.8)
Alkaline Phosphatase: 61 IU/L (ref 39–117)
BUN/Creatinine Ratio: 23 (ref 11–26)
BUN: 12 mg/dL (ref 8–27)
Bilirubin Total: 0.4 mg/dL (ref 0.0–1.2)
CO2: 24 mmol/L (ref 18–29)
Calcium: 9.4 mg/dL (ref 8.7–10.3)
Chloride: 97 mmol/L (ref 97–108)
Creatinine, Ser: 0.53 mg/dL — ABNORMAL LOW (ref 0.57–1.00)
GFR calc Af Amer: 109 mL/min/{1.73_m2} (ref >59)
GFR calc non Af Amer: 94 mL/min/{1.73_m2} (ref >59)
Globulin, Total: 2 g/dL (ref 1.5–4.5)
Glucose: 94 mg/dL (ref 65–99)
Potassium: 4.7 mmol/L (ref 3.5–5.2)
Sodium: 136 mmol/L (ref 134–144)
Total Protein: 6.5 g/dL (ref 6.0–8.5)

## 2015-01-21 LAB — HEMOGLOBIN A1C
Est. average glucose Bld gHb Est-mCnc: 128 mg/dL
Hgb A1c MFr Bld: 6.1 % — ABNORMAL HIGH (ref 4.8–5.6)

## 2015-01-21 LAB — TSH: TSH: 1.68 u[IU]/mL (ref 0.450–4.500)

## 2015-01-26 ENCOUNTER — Telehealth: Payer: Self-pay | Admitting: Internal Medicine

## 2015-01-26 NOTE — Telephone Encounter (Signed)
Called [name], says she plans to get a flu shot.  She will go to [place] this week. Told pt I would call her back on Friday of this week to confirm.  Pt did not want to come to the office.  cdavis

## 2015-01-28 ENCOUNTER — Other Ambulatory Visit: Payer: Self-pay | Admitting: Internal Medicine

## 2015-02-13 ENCOUNTER — Encounter: Payer: Self-pay | Admitting: Nurse Practitioner

## 2015-02-13 NOTE — Progress Notes (Signed)
This encounter was created in error - please disregard.

## 2015-02-15 ENCOUNTER — Encounter: Payer: Self-pay | Admitting: Nurse Practitioner

## 2015-02-15 ENCOUNTER — Other Ambulatory Visit: Payer: Self-pay | Admitting: *Deleted

## 2015-02-15 DIAGNOSIS — G245 Blepharospasm: Secondary | ICD-10-CM

## 2015-02-15 MED ORDER — CLONAZEPAM 1 MG PO TABS: ORAL_TABLET | ORAL | Status: DC

## 2015-02-20 NOTE — Progress Notes (Signed)
This encounter was created in error - please disregard.

## 2015-02-22 ENCOUNTER — Encounter: Payer: Self-pay | Admitting: Nurse Practitioner

## 2015-02-28 ENCOUNTER — Other Ambulatory Visit: Payer: Self-pay | Admitting: Internal Medicine

## 2015-03-01 ENCOUNTER — Encounter: Payer: Self-pay | Admitting: Nurse Practitioner

## 2015-03-22 ENCOUNTER — Other Ambulatory Visit: Payer: Self-pay | Admitting: Cardiology

## 2015-03-24 ENCOUNTER — Ambulatory Visit (INDEPENDENT_AMBULATORY_CARE_PROVIDER_SITE_OTHER): Payer: Medicare Other | Admitting: Nurse Practitioner

## 2015-03-24 ENCOUNTER — Other Ambulatory Visit: Payer: Self-pay | Admitting: *Deleted

## 2015-03-24 ENCOUNTER — Encounter: Payer: Self-pay | Admitting: Nurse Practitioner

## 2015-03-24 VITALS — BP 138/90 | HR 67 | Ht 71.0 in | Wt 165.0 lb

## 2015-03-24 DIAGNOSIS — F329 Major depressive disorder, single episode, unspecified: Secondary | ICD-10-CM | POA: Diagnosis not present

## 2015-03-24 DIAGNOSIS — F32A Depression, unspecified: Secondary | ICD-10-CM

## 2015-03-24 DIAGNOSIS — I495 Sick sinus syndrome: Secondary | ICD-10-CM | POA: Diagnosis not present

## 2015-03-24 DIAGNOSIS — I10 Essential (primary) hypertension: Secondary | ICD-10-CM

## 2015-03-24 DIAGNOSIS — R55 Syncope and collapse: Secondary | ICD-10-CM

## 2015-03-24 MED ORDER — PAROXETINE HCL 10 MG PO TABS: 10.0000 mg | ORAL_TABLET | Freq: Every day | ORAL | Status: DC

## 2015-03-24 NOTE — Patient Instructions (Addendum)
Medication Instructions:  Your physician has recommended you make the following change in your medication:  1) START Paxil 10 mg daily  (refills through primary care physician)  Labwork: None ordered  Testing/Procedures: None ordered  Follow-Up: Remote monitoring is used to monitor your Pacemaker of ICD from home. This monitoring reduces the number of office visits required to check your device to one time per year. It allows [place] to keep an eye on the functioning of your device to ensure it is working properly. You are scheduled for a device check from home on 06/23/15. You may send your transmission at any time that day. If you have a wireless device, the transmission will be sent automatically. After your physician reviews your transmission, you will receive a postcard with your next transmission date.  Your physician wants you to follow-up in: 1 year with Dr. Rayann Heman. You will receive a reminder letter in the mail two months in advance. If you don't receive a letter, please call our office to schedule the follow-up appointment.   Any Other Special Instructions Will Be Listed Below (If Applicable). You have been given a prescription for compression hose

## 2015-03-24 NOTE — Progress Notes (Signed)
Electrophysiology Office Note Date: 03/24/2015  ID:  Mary Higgins, Mary Higgins 01-Nov-1941, MRN [id number]  PCP: Mary Dooms, MD Primary Cardiologist: previously Dr Rollene Fare Electrophysiologist: [facility]  CC: Pacemaker follow-up  Mary Higgins is a 74 y.o. female is seen today for Dr Mary Higgins.  She presents today for routine electrophysiology followup.  Since last being seen in our clinic, the patient reports doing very well.  She denies chest pain, palpitations, dyspnea, PND, orthopnea, nausea, vomiting, dizziness, syncope, edema, weight gain, or early satiety.  Device History: STJ dual chamber PPM implanted 2007 for neurocardiogenic syncope; generator change to Biotronik dual chamber pacemaker 09/2014 Dr Mary Higgins   Past Medical History  Diagnosis Date  . Neurocardiogenic syncope     a. s/p Biotronik dual chamber pacemaker  . COPD (chronic obstructive pulmonary disease)   . Carotid bruit     Bilateral. Mild to moderate disease on LEFT and mild on the RIGHT in 2012  . Diabetes mellitus without complication     AODM  . Hyperlipidemia   . Intracerebral bleed 2003    Remote intracerebral blled and coiling by Dr. Estanislado Pandy for cerebral aneurysm ans a shunt placed by Dr. Vertell Limber remotely. No seizure disorder and this happened in 2003 with an anterior communicating aneurysm.  Marland Kitchen Anxiety and depression     Hx of  . Breast cancer     s/p lumpectomy  . Type II or unspecified type diabetes mellitus without mention of complication, not stated as uncontrolled    . Tobacco abuse    . Insomnia    . Hypertension    . GERD (gastroesophageal reflux disease)    . Blepharospasm    . Sinus node dysfunction    . Fuchs' corneal dystrophy    Past Surgical History  Procedure Laterality Date  . Pacemaker insertion  07/12/2006    [employer] XL DR  -  Dr. Tami Ribas  . Breast lumpectomy Right     For cancer, no recurrence  . Partial hysterectomy    . Cholecystectomy    . Aneurysm coiling   2003    Cerebral aneurysm ;Dr. Luanne Higgins  . Ventriculoperitoneal shunt  2003    Dr. Erline Levine  . Pacemaker generator change N/A 09/25/2014    BTK dual chamber pacemkaer implanted by Dr Mary Higgins    Current Outpatient Prescriptions  Medication Sig Dispense Refill  . aspirin 81 MG tablet Take 81 mg by mouth daily.    Marland Kitchen atorvastatin (LIPITOR) 40 MG tablet TAKE 1 TABLET BY MOUTH EVERY DAY 30 tablet 6  . bacitracin-polymyxin b (POLYSPORIN) ophthalmic ointment   0  . clonazePAM (KLONOPIN) 1 MG tablet One twice daily to help control spasms at eyes 60 tablet 1  . clotrimazole (LOTRIMIN) 1 % external solution Apply daily to affected nails 30 mL 3  . HYDROcodone-acetaminophen (NORCO/VICODIN) 5-325 MG per tablet Take 1 tablet every 6 hours as needed for pain 100 tablet 0  . losartan (COZAAR) 100 MG tablet Take 1 tablet (100 mg total) by mouth daily. 90 tablet 3  . metFORMIN (GLUCOPHAGE) 500 MG tablet TAKE 1 TABLET BY MOUTH WITH BREAKFAST AND 1 TABLET BY MOUTH WITH SUPPER TO CONTROL DIABETES 60 tablet 5  . metoprolol (LOPRESSOR) 50 MG tablet TAKE 1 TABLET BY MOUTH TWICE DAILY 60 tablet 5  . midodrine (PROAMATINE) 5 MG tablet Take 1 tablet (5 mg total) by mouth 3 (three) times daily. 90 tablet 8  . mirtazapine (REMERON) 15 MG tablet  5  . Multiple Vitamin (MULTIVITAMIN) capsule Take 1 capsule by mouth daily.    . pantoprazole (PROTONIX) 40 MG tablet TAKE 1 TABLET BY MOUTH EVERY DAY TO REDUCE STOMACH ACID 30 tablet 0  . temazepam (RESTORIL) 15 MG capsule Take 1 capsule (15 mg total) by mouth at bedtime as needed for sleep. 30 capsule 0   No current facility-administered medications for this visit.    Allergies:   Review of patient's allergies indicates no known allergies.   Social History: History   Social History  . Marital Status: Legally Separated    Spouse Name: N/A  . Number of Children: N/A  . Years of Education: N/A   Occupational History  . Not on file.   Social History  Main Topics  . Smoking status: Current Every Day Smoker -- 0.50 packs/day    Types: Cigarettes  . Smokeless tobacco: Not on file  . Alcohol Use: No  . Drug Use: No  . Sexual Activity: Not on file   Other Topics Concern  . Not on file   Social History Narrative    Family History: Family History  Problem Relation Age of Onset  . Hypertension       Review of Systems: All other systems reviewed and are otherwise negative except as noted above.   Physical Exam: VS:  BP 138/90 mmHg  Pulse 67  Ht 5\' 11"  (1.803 m)  Wt 165 lb (74.844 kg)  BMI 23.02 kg/m2 , BMI Body mass index is 23.02 kg/(m^2).  GEN- The patient is well appearing, alert and oriented x 3 today.   HEENT: normocephalic, atraumatic; sclera clear, conjunctiva pink; hearing intact; oropharynx clear; neck supple  Lymph- no cervical lymphadenopathy Lungs- Clear to ausculation bilaterally, normal work of breathing.  No wheezes, rales, rhonchi Heart- Regular rate and rhythm, no murmurs, rubs or gallops  GI- soft, non-tender, non-distended, bowel sounds present Extremities- no clubbing, cyanosis, 1+ BLE edema; DP/PT/radial pulses 2+ bilaterally MS- no significant deformity or atrophy Skin- warm and dry, no rash or lesion; PPM pocket well healed Psych- euthymic mood, full affect Neuro- strength and sensation are intact  PPM Interrogation- reviewed in detail today,  See PACEART report  EKG:  EKG is ordered today. The ekg ordered today shows sinus rhythm, rate 67, normal intervals, poor R wave progression  Recent Labs: 09/11/2014: Hemoglobin 12.7; Platelets 282.0 01/20/2015: ALT 8; BUN 12; Creatinine 0.53*; Potassium 4.7; Sodium 136; TSH 1.680   Wt Readings from Last 3 Encounters:  03/24/15 165 lb (74.844 kg)  01/20/15 162 lb 6.4 oz (73.664 kg)  09/25/14 164 lb (74.39 kg)     Assessment and Plan:  1.  Neurocardiogenic syncope/sinus node dysfunction Normal PPM function See Pace Art report No changes  today Encouraged support hose and adequate hydration  2.  Tobacco abuse Cessation advised  3.  HTN Stable No change required today  4.  Depression The patient and her son think she is depressed.   Dr Mary Higgins saw patient today and prescribed Paxil 10mg  daily.  He reviewed risks and benefits with the patient.  She was given 30 tablets with no refills and is aware that we will not refill this medication going forward. I encouraged them to talk with her PCP about treatment options and further evaluation.  No HI/SI today.   Current medicines are reviewed at length with the patient today.   The patient does not have concerns regarding her medicines.  The following changes were made today:  Dr Mary Higgins  prescribed Paxil 10mg  1 tablet daily #30 with no refills.  Labs/ tests ordered today include: none   Disposition:   Follow up with Bi[employer]ron[employer] home monitoring, follow up with Dr Mary Higgins in 1 year   Signed, Chanetta Marshall, NP 03/24/2015 2:40 PM  [place] [address] [place] [address] [phone] (office) [phone] (fax)

## 2015-03-26 ENCOUNTER — Encounter: Payer: Self-pay | Admitting: Nurse Practitioner

## 2015-03-29 LAB — CUP PACEART INCLINIC DEVICE CHECK
Date Time Interrogation Session: 20160516162242
Lead Channel Setting Pacing Amplitude: 2 V
Lead Channel Setting Pacing Amplitude: 3 V
Lead Channel Setting Pacing Pulse Width: 0.8 ms
Lead Channel Setting Sensing Sensitivity: 2 mV
Pulse Gen Model: 394931
Pulse Gen Serial Number: 68408118

## 2015-03-30 ENCOUNTER — Other Ambulatory Visit: Payer: Self-pay | Admitting: Internal Medicine

## 2015-04-13 ENCOUNTER — Encounter: Payer: Self-pay | Admitting: Internal Medicine

## 2015-04-20 ENCOUNTER — Other Ambulatory Visit: Payer: Self-pay | Admitting: Internal Medicine

## 2015-04-26 ENCOUNTER — Other Ambulatory Visit: Payer: Self-pay | Admitting: *Deleted

## 2015-04-26 MED ORDER — PAROXETINE HCL 10 MG PO TABS: 10.0000 mg | ORAL_TABLET | Freq: Every day | ORAL | Status: DC

## 2015-04-26 NOTE — Telephone Encounter (Signed)
Son requested refill. Faxed to pharmacy.

## 2015-05-10 ENCOUNTER — Other Ambulatory Visit: Payer: Medicare Other

## 2015-05-10 ENCOUNTER — Other Ambulatory Visit: Payer: Self-pay | Admitting: *Deleted

## 2015-05-10 DIAGNOSIS — I10 Essential (primary) hypertension: Secondary | ICD-10-CM

## 2015-05-10 DIAGNOSIS — F418 Other specified anxiety disorders: Secondary | ICD-10-CM

## 2015-05-10 DIAGNOSIS — E119 Type 2 diabetes mellitus without complications: Secondary | ICD-10-CM

## 2015-05-10 DIAGNOSIS — E785 Hyperlipidemia, unspecified: Secondary | ICD-10-CM

## 2015-05-11 ENCOUNTER — Other Ambulatory Visit: Payer: Medicare Other

## 2015-05-11 ENCOUNTER — Other Ambulatory Visit: Payer: Self-pay | Admitting: Internal Medicine

## 2015-05-12 ENCOUNTER — Ambulatory Visit (INDEPENDENT_AMBULATORY_CARE_PROVIDER_SITE_OTHER): Payer: Medicare Other | Admitting: Internal Medicine

## 2015-05-12 ENCOUNTER — Other Ambulatory Visit: Payer: Self-pay | Admitting: Internal Medicine

## 2015-05-12 ENCOUNTER — Ambulatory Visit: Payer: Medicare Other | Admitting: Internal Medicine

## 2015-05-12 ENCOUNTER — Encounter: Payer: Self-pay | Admitting: Internal Medicine

## 2015-05-12 VITALS — BP 128/78 | HR 87 | Temp 97.4°F | Resp 20 | Ht 71.0 in | Wt 163.4 lb

## 2015-05-12 DIAGNOSIS — K219 Gastro-esophageal reflux disease without esophagitis: Secondary | ICD-10-CM

## 2015-05-12 DIAGNOSIS — R0602 Shortness of breath: Secondary | ICD-10-CM

## 2015-05-12 DIAGNOSIS — F329 Major depressive disorder, single episode, unspecified: Secondary | ICD-10-CM | POA: Diagnosis not present

## 2015-05-12 DIAGNOSIS — F32A Depression, unspecified: Secondary | ICD-10-CM

## 2015-05-12 DIAGNOSIS — E785 Hyperlipidemia, unspecified: Secondary | ICD-10-CM | POA: Diagnosis not present

## 2015-05-12 DIAGNOSIS — R609 Edema, unspecified: Secondary | ICD-10-CM | POA: Diagnosis not present

## 2015-05-12 DIAGNOSIS — F17200 Nicotine dependence, unspecified, uncomplicated: Secondary | ICD-10-CM | POA: Diagnosis not present

## 2015-05-12 DIAGNOSIS — I10 Essential (primary) hypertension: Secondary | ICD-10-CM

## 2015-05-12 DIAGNOSIS — G245 Blepharospasm: Secondary | ICD-10-CM

## 2015-05-12 DIAGNOSIS — E119 Type 2 diabetes mellitus without complications: Secondary | ICD-10-CM

## 2015-05-12 DIAGNOSIS — Z9181 History of falling: Secondary | ICD-10-CM

## 2015-05-12 DIAGNOSIS — G4700 Insomnia, unspecified: Secondary | ICD-10-CM

## 2015-05-12 DIAGNOSIS — R109 Unspecified abdominal pain: Secondary | ICD-10-CM | POA: Diagnosis not present

## 2015-05-12 MED ORDER — PAROXETINE HCL 20 MG PO TABS: ORAL_TABLET | ORAL | Status: DC

## 2015-05-12 MED ORDER — HYDROCODONE-ACETAMINOPHEN 5-325 MG PO TABS: ORAL_TABLET | ORAL | Status: DC

## 2015-05-12 NOTE — Progress Notes (Signed)
Patient ID: Mary Higgins, female   DOB: [birth year], 73 y.o.   MRN: [id number]    Facility  [facility]    Place of Service:   OFFICE    No Known Allergies  Chief Complaint  Patient presents with  . Medical Management of Chronic Issues    3 month follow-up     HPI:  History of fall - continues with some residual right flank discomfort. Using Vicodin.  Gastroesophageal reflux disease without esophagitis: Continues on Protonix for relief of symptoms  Edema - improved  Blepharospasm - chronic condition for which she has been seen at [facility]. He would like to transfer her care to a local physician who can do the injections. She is finding the transports back and forth from [place] to more difficult to arrange. In addition to Botox injections, she also receives Klonopin.  Compulsive tobacco user syndrome: Continues with use of cigarettes.  Shortness of breath: Patient seems to understand correlation between cigarettes and dyspnea. Dyspnea is most likely related to chronic lung disorder  Essential hypertension - controlled  Insomnia - continues to have some problems with insomnia despite the use of mirtazipine  Depression - patient feels that she is not getting adequate relief from her depression. She is on both mirtazapine and paroxetine. She has reached maximal dosage on the mirtazapine.  Type 2 diabetes mellitus without complication - controlled   Hyperlipidemia - needs follow-up Lipid panel    Medications: Patient's Medications  New Prescriptions   No medications on file  Previous Medications   ASPIRIN 81 MG TABLET    Take 81 mg by mouth daily.   ATORVASTATIN (LIPITOR) 40 MG TABLET    TAKE 1 TABLET BY MOUTH EVERY DAY   BACITRACIN-POLYMYXIN B (POLYSPORIN) OPHTHALMIC OINTMENT       CLONAZEPAM (KLONOPIN) 1 MG TABLET    TAKE 1 TABLET BY MOUTH TWICE DAILY TO HELP CONTROL SPASMS AT EYES   CLOTRIMAZOLE (LOTRIMIN) 1 % EXTERNAL SOLUTION    Apply daily to affected nails   HYDROCODONE-ACETAMINOPHEN (NORCO/VICODIN) 5-325 MG PER TABLET    Take 1 tablet every 6 hours as needed for pain   LOSARTAN (COZAAR) 100 MG TABLET    Take 1 tablet (100 mg total) by mouth daily.   METFORMIN (GLUCOPHAGE) 500 MG TABLET    TAKE 1 TABLET BY MOUTH WITH BREAKFAST AND 1 TABLET BY MOUTH WITH SUPPER TO CONTROL DIABETES   METOPROLOL (LOPRESSOR) 50 MG TABLET    TAKE 1 TABLET BY MOUTH TWICE DAILY   MIDODRINE (PROAMATINE) 5 MG TABLET    Take 1 tablet (5 mg total) by mouth 3 (three) times daily.   MIRTAZAPINE (REMERON) 15 MG TABLET       MULTIPLE VITAMIN (MULTIVITAMIN) CAPSULE    Take 1 capsule by mouth daily.   PANTOPRAZOLE (PROTONIX) 40 MG TABLET    TAKE 1 TABLET BY MOUTH EVERY DAY TO REDUCE STOMACH ACID   PAROXETINE (PAXIL) 10 MG TABLET    Take 1 tablet (10 mg total) by mouth daily.   TEMAZEPAM (RESTORIL) 15 MG CAPSULE    Take 1 capsule (15 mg total) by mouth at bedtime as needed for sleep.  Modified Medications   No medications on file  Discontinued Medications   No medications on file     Review of Systems  Constitutional: Positive for appetite change (Decreased) and fatigue.  HENT: Positive for hearing loss.        Dentures  Eyes: Positive for visual disturbance (Corrective lenses).  Xerophthalmia  Respiratory: Positive for shortness of breath. Negative for cough.        History of chronic tobacco abuse  Cardiovascular: Positive for palpitations and leg swelling. Negative for chest pain.  Gastrointestinal: Negative for abdominal pain, abdominal distention and anal bleeding.       Hemorrhoids  Endocrine: Negative.        History of diabetes  Genitourinary: Negative.   Musculoskeletal: Positive for gait problem.       History of falls and gait instability. Chronic right flank discomfort.  Skin: Negative.   Allergic/Immunologic: Negative.   Neurological: Positive for dizziness.       History of neurocardiogenic syncope. Memory disorder. History of intracerebral bleed  in 2003. Subsequently had a small aneurysm treated by Dr. Estanislado Pandy with a coil. History of shunt to the brain in the left parietal area placed by Dr. Vertell Limber, neurosurgeon in 2003.  Hematological: Negative.   Psychiatric/Behavioral: Positive for confusion, sleep disturbance, dysphoric mood and decreased concentration.    Filed Vitals:   05/12/15 1143  BP: 128/78  Pulse: 87  Temp: 97.4 F (36.3 C)  TempSrc: Oral  Resp: 20  Height: 5\' 11"  (1.803 m)  Weight: 163 lb 6.4 oz (74.118 kg)  SpO2: 93%   Body mass index is 22.8 kg/(m^2).  Physical Exam  Constitutional: She is oriented to person, place, and time. She appears well-developed and well-nourished. No distress.  HENT:  Right Ear: External ear normal.  Left Ear: External ear normal.  Nose: Nose normal.  Mouth/Throat: Oropharynx is clear and moist. No oropharyngeal exudate.  Ventriculoperitoneal shunt. Upper dentures  Eyes: Conjunctivae and EOM are normal. Pupils are equal, round, and reactive to light. No scleral icterus.  Bilateral  Neck: No JVD present. No tracheal deviation present. No thyromegaly present.  Cardiovascular: Normal rate, regular rhythm, normal heart sounds and intact distal pulses.  Exam reveals no gallop and no friction rub.   No murmur heard. Pulmonary/Chest: Effort normal. No respiratory distress. She has no wheezes. She has no rales. She exhibits no tenderness.  Pacemaker right upper chest Atrophic breasts. Breasts are previous breast cancer surgery  Abdominal: She exhibits no distension and no mass. There is no tenderness.  Genitourinary:  History of hemorrhoids  Musculoskeletal: Normal range of motion. She exhibits edema (Approximately 1+ bilaterally in lower legs and ankles). She exhibits no tenderness.  Lymphadenopathy:    She has no cervical adenopathy.  Neurological: She is alert and oriented to person, place, and time. No cranial nerve deficit. Coordination normal.  Skin: No rash noted. She is not  diaphoretic. No erythema. No pallor.  Psychiatric: She has a normal mood and affect. Her behavior is normal. Judgment and thought content normal.     Labs reviewed: Office Visit on 03/24/2015  Component Date Value Ref Range Status  . Pulse Generator Manufacturer 03/24/2015 BITR   Preliminary  . Date Time Interrogation Session 03/24/2015 [id number]   Preliminary  . Pulse Gen Model 03/24/2015 [id number]   Preliminary  . Pulse Gen Serial Number 03/24/2015 [id number]   Preliminary  . Implantable Pulse Generator Type 03/24/2015 Implantable Pulse Generator   Preliminary  . Implantable Pulse Generator Implan* 03/24/2015 [id number]   Preliminary  . Lead Channel Setting Sensing Sensi* 03/24/2015 2   Preliminary  . Lead Channel Setting Pacing Amplit* 03/24/2015 2   Preliminary  . Lead Channel Setting Pacing Pulse * 03/24/2015 0.8   Preliminary  . Lead Channel Setting Pacing Amplit* 03/24/2015 3   Preliminary  . Zone  Setting Type Category 03/24/2015 VF   Preliminary  . Zone Setting Type Category 03/24/2015 VT   Preliminary  . Zone Setting Type Category 03/24/2015 VENTRICULAR_TACHYCARDIA_1   Preliminary  . Zone Setting Type Category 03/24/2015 VENTRICULAR_TACHYCARDIA_2   Preliminary  . Zone Setting Type Category 03/24/2015 ATRIAL_FIBRILLATION   Preliminary  . Zone Setting Type Category 03/24/2015 ATAF   Preliminary  . Eval Rhythm 03/24/2015 SR   Preliminary     Assessment/Plan  1. History of fall Continues with some residual right flank discomfort. Benefits from use of Vicodin occasionally.  2. Gastroesophageal reflux disease without esophagitis Continue Protonix  3. Edema Improved  4. Blepharospasm Continue Klonopin - Ambulatory referral to Plastic Surgery  5. Compulsive tobacco user syndrome Counseled on cessation of cigarettes  6. Shortness of breath Not likely to improve and she stopped smoking  7. Essential hypertension Controlled  8. Insomnia Continue mirtazapine  15 mg at bedtime. Insomnia may be a reflection of her continuing issues with depression.  9. Depression Inadequate response to mirtazapine and 10 mg paroxetine. Because of her sleep disorder, I'll leave the mirtazapine in place, but paroxetine will be increased to 20 mg daily. - PARoxetine (PAXIL) 20 MG tablet; One daily to help depression  Dispense: 30 tablet; Refill: 5  10. Type 2 diabetes mellitus without complication - Hemoglobin A1c; Future - Comprehensive metabolic panel; Future  11. Hyperlipidemia - Lipid panel; Future  12. Flank pain -HYDROcodone-acetaminophen (NORCO/VICODIN) 5-325 MG per tablet; Take 1 tablet every 6 hours as needed for pain  Dispense: 100 tablet; Refill: 0

## 2015-05-12 NOTE — Progress Notes (Signed)
Patient ID: Mary Higgins, female   DOB: [birth year], 74 y.o.   MRN: [id number]    Facility  [facility]    Place of Service:   OFFICE    No Known Allergies  Chief Complaint  Patient presents with  . Medical Management of Chronic Issues    3 month follow-up    HPI:  History of fall:  Reports frequent falls (8-10 falls "recently"), does not use assistive devices often, falls d/t gait instability, no dizziness, lightheadedness associated with falls. commonly has back pain related to falls. Would like refill of Norco for back pain.  Gastroesophageal reflux disease without esophagitis: recently has occasional epigastric pain, burning following dinner. Son thinks may be diet related.   Edema: Minimal  Blepharospasm: Ongoing. Has bilaterals spasm as well as eye pain. Takes Clonazepam, reports moderate relief of symptoms, also helps with her nerves. Has been followed at [facility] in the past for Botox injections, the drive to [facility] is burdensome to her and she would like to find somewhere closer to receive these injections. Reports the Botox provides good relief of spasms as well as some relief of eye discomfort. No spasms during my exam.  Compulsive tobacco user syndrome: Has started smoking again. Has expressed interest in quitting, interested in trying Chantix.   Shortness of breath: With exertion, resolves with rest. No associated chest pain.   Essential hypertension: Controlled.  Insomnia: Remeron - no improvement, rarely takes it  Depression:staying in bed all day, feels "lonesome," feels like she "should be crying and often feels wants to inside, but can't cry because of eye problems." Has been taking Paxil 10 mg x 6 weeks, was initially Rx'd by Dr Rayann Heman. No improvement in symptoms or side effects reported. Would like to increase her dose.      Medications: Patient's Medications  New Prescriptions   PAROXETINE (PAXIL) 20 MG TABLET    One daily to help depression  Previous  Medications   ASPIRIN 81 MG TABLET    Take 81 mg by mouth daily.   ATORVASTATIN (LIPITOR) 40 MG TABLET    TAKE 1 TABLET BY MOUTH EVERY DAY   BACITRACIN-POLYMYXIN B (POLYSPORIN) OPHTHALMIC OINTMENT       CLONAZEPAM (KLONOPIN) 1 MG TABLET    TAKE 1 TABLET BY MOUTH TWICE DAILY TO HELP CONTROL SPASMS AT EYES   CLOTRIMAZOLE (LOTRIMIN) 1 % EXTERNAL SOLUTION    Apply daily to affected nails   LOSARTAN (COZAAR) 100 MG TABLET    Take 1 tablet (100 mg total) by mouth daily.   METFORMIN (GLUCOPHAGE) 500 MG TABLET    TAKE 1 TABLET BY MOUTH WITH BREAKFAST AND 1 TABLET BY MOUTH WITH SUPPER TO CONTROL DIABETES   METOPROLOL (LOPRESSOR) 50 MG TABLET    TAKE 1 TABLET BY MOUTH TWICE DAILY   MIDODRINE (PROAMATINE) 5 MG TABLET    Take 1 tablet (5 mg total) by mouth 3 (three) times daily.   MIRTAZAPINE (REMERON) 15 MG TABLET       MULTIPLE VITAMIN (MULTIVITAMIN) CAPSULE    Take 1 capsule by mouth daily.   PANTOPRAZOLE (PROTONIX) 40 MG TABLET    TAKE 1 TABLET BY MOUTH EVERY DAY TO REDUCE STOMACH ACID   TEMAZEPAM (RESTORIL) 15 MG CAPSULE    Take 1 capsule (15 mg total) by mouth at bedtime as needed for sleep.  Modified Medications   Modified Medication Previous Medication   HYDROCODONE-ACETAMINOPHEN (NORCO/VICODIN) 5-325 MG PER TABLET HYDROcodone-acetaminophen (NORCO/VICODIN) 5-325 MG per tablet  Take 1 tablet every 6 hours as needed for pain    Take 1 tablet every 6 hours as needed for pain  Discontinued Medications   PAROXETINE (PAXIL) 10 MG TABLET    Take 1 tablet (10 mg total) by mouth daily.     Review of Systems  Constitutional: Positive for appetite change (Decreased) and fatigue.  HENT: Positive for hearing loss.        Dentures  Eyes: Positive for pain and visual disturbance (Corrective lenses).       Xerophthalmia  Respiratory: Negative for cough and shortness of breath.   Cardiovascular: Positive for palpitations and leg swelling. Negative for chest pain.  Gastrointestinal: Positive for  abdominal pain (following dinner). Negative for abdominal distention and anal bleeding.       Hemorrhoids  Endocrine: Negative.        History of diabetes  Genitourinary: Negative.   Musculoskeletal: Positive for gait problem.       History of falls and gait instability.  Skin: Negative.   Allergic/Immunologic: Negative.   Neurological: Negative for dizziness.       History of neurocardiogenic syncope. Memory disorder. History of intracerebral bleed in 2003. Subsequently had a small aneurysm treated by Dr. Estanislado Pandy with a coil. History of shunt to the brain in the left parietal area placed by Dr. Vertell Limber, neurosurgeon in 2003.  Psychiatric/Behavioral: Positive for dysphoric mood.    Filed Vitals:   05/12/15 1143  BP: 128/78  Pulse: 87  Temp: 97.4 F (36.3 C)  TempSrc: Oral  Resp: 20  Height: 5\' 11"  (1.803 m)  Weight: 163 lb 6.4 oz (74.118 kg)  SpO2: 93%   Body mass index is 22.8 kg/(m^2).  Physical Exam  Constitutional: She is oriented to person, place, and time. She appears well-developed and well-nourished. No distress.  HENT:  Right Ear: External ear normal.  Left Ear: External ear normal.  Nose: Nose normal.  Mouth/Throat: Oropharynx is clear and moist. No oropharyngeal exudate.  Ventriculoperitoneal shunt. Upper dentures  Eyes: Conjunctivae and EOM are normal. Pupils are equal, round, and reactive to light. No scleral icterus.  Neck: No JVD present. No tracheal deviation present. No thyromegaly present.  Cardiovascular: Normal rate, regular rhythm, normal heart sounds and intact distal pulses.  Exam reveals no gallop and no friction rub.   No murmur heard. Pulmonary/Chest: Effort normal. No respiratory distress. She has no wheezes. She has no rales. She exhibits no tenderness.  Pacemaker right upper chest Atrophic breasts. Breasts are previous breast cancer surgery  Abdominal: She exhibits no distension and no mass. There is no tenderness.  Genitourinary:  History  of hemorrhoids  Musculoskeletal: Normal range of motion. She exhibits edema (Approximately 1+ bilaterally in lower legs and ankles). She exhibits no tenderness.  Lymphadenopathy:    She has no cervical adenopathy.  Neurological: She is alert and oriented to person, place, and time. No cranial nerve deficit. Coordination normal.  Skin: No rash noted. She is not diaphoretic. No erythema. No pallor.  Psychiatric: Her behavior is normal. Judgment and thought content normal. She exhibits a depressed mood.     Labs reviewed: Office Visit on 03/24/2015  Component Date Value Ref Range Status  . Pulse Generator Manufacturer 03/24/2015 BITR   Preliminary  . Date Time Interrogation Session 03/24/2015 [id number]   Preliminary  . Pulse Gen Model 03/24/2015 [id number]   Preliminary  . Pulse Gen Serial Number 03/24/2015 [id number]   Preliminary  . Implantable Pulse Generator Type 03/24/2015 Implantable Pulse Generator  Preliminary  . Implantable Pulse Generator Implan* 03/24/2015 [id number]   Preliminary  . Lead Channel Setting Sensing Sensi* 03/24/2015 2   Preliminary  . Lead Channel Setting Pacing Amplit* 03/24/2015 2   Preliminary  . Lead Channel Setting Pacing Pulse * 03/24/2015 0.8   Preliminary  . Lead Channel Setting Pacing Amplit* 03/24/2015 3   Preliminary  . Zone Setting Type Category 03/24/2015 VF   Preliminary  . Zone Setting Type Category 03/24/2015 VT   Preliminary  . Zone Setting Type Category 03/24/2015 VENTRICULAR_TACHYCARDIA_1   Preliminary  . Zone Setting Type Category 03/24/2015 VENTRICULAR_TACHYCARDIA_2   Preliminary  . Zone Setting Type Category 03/24/2015 ATRIAL_FIBRILLATION   Preliminary  . Zone Setting Type Category 03/24/2015 ATAF   Preliminary  . Eval Rhythm 03/24/2015 SR   Preliminary     Assessment/Plan 1. History of fall Falls checklist and Safety information provided and discussed with patient and her son Encouraged to use assistive devices Rx for 4 point  cane given - HYDROcodone-acetaminophen (NORCO/VICODIN) 5-325 MG per tablet; Take 1 tablet every 6 hours as needed for pain  Dispense: 100 tablet; Refill: 0  2. Gastroesophageal reflux disease without esophagitis Continue Pantoprazole 40 mg daily Discussed dietary recommendations  3. Edema Improved  4. Blepharospasm Referral for Botox injections at local practice  5. Compulsive tobacco user syndrome Encouraged to quit Discussed gum chewing or Nicotine patch as adjunts No Chantix today  6. Shortness of breath Improved  7. Essential hypertension Controlled  8. Insomnia Ensure have stopped Remeron  9. Depression - PARoxetine (PAXIL) 20 MG tablet; One daily to help depression  Dispense: 30 tablet; Refill: 5  10. Type 2 diabetes mellitus without complication - Hemoglobin A1c; Future - Comprehensive metabolic panel; Future  11. Hyperlipidemia - Lipid panel; Future

## 2015-05-21 NOTE — Addendum Note (Signed)
Addended by: Estill Dooms on: 05/21/2015 12:19 PM   Modules accepted: Level of Service

## 2015-05-26 NOTE — Addendum Note (Signed)
Addended by: Estill Dooms on: 05/26/2015 11:22 AM   Modules accepted: Level of Service

## 2015-05-29 ENCOUNTER — Other Ambulatory Visit: Payer: Self-pay | Admitting: Internal Medicine

## 2015-05-31 ENCOUNTER — Other Ambulatory Visit: Payer: Self-pay | Admitting: *Deleted

## 2015-05-31 ENCOUNTER — Other Ambulatory Visit: Payer: Self-pay | Admitting: Internal Medicine

## 2015-05-31 MED ORDER — MIRTAZAPINE 15 MG PO TABS: ORAL_TABLET | ORAL | Status: DC

## 2015-05-31 NOTE — Telephone Encounter (Signed)
[facility].  

## 2015-06-03 ENCOUNTER — Encounter: Payer: Self-pay | Admitting: Cardiology

## 2015-06-03 ENCOUNTER — Encounter: Payer: Self-pay | Admitting: Cardiovascular Disease

## 2015-06-08 ENCOUNTER — Other Ambulatory Visit: Payer: Self-pay | Admitting: Cardiology

## 2015-06-08 NOTE — Telephone Encounter (Signed)
SPOKE TO PATIENT PATIENT STATES SHE HAS ONLY SEEN DR ALLRED SINCE Dr Rollene Fare HAS RETIRED. PATIENT STATES DR Rayann Heman IS HER PRIMARY CARDIOLOGIST NOW.  E-SENT PRESCRIPTION TO PHARMACY

## 2015-06-09 ENCOUNTER — Other Ambulatory Visit: Payer: Self-pay | Admitting: Internal Medicine

## 2015-06-10 ENCOUNTER — Other Ambulatory Visit: Payer: Self-pay | Admitting: Internal Medicine

## 2015-06-10 MED ORDER — CLONAZEPAM 1 MG PO TABS: ORAL_TABLET | ORAL | Status: DC

## 2015-06-19 ENCOUNTER — Other Ambulatory Visit: Payer: Self-pay | Admitting: Internal Medicine

## 2015-06-23 ENCOUNTER — Encounter: Payer: Self-pay | Admitting: Internal Medicine

## 2015-06-23 ENCOUNTER — Ambulatory Visit (INDEPENDENT_AMBULATORY_CARE_PROVIDER_SITE_OTHER): Payer: Medicare Other | Admitting: *Deleted

## 2015-06-23 DIAGNOSIS — I495 Sick sinus syndrome: Secondary | ICD-10-CM | POA: Diagnosis not present

## 2015-06-23 NOTE — Progress Notes (Signed)
Remote pacemaker transmission.   

## 2015-06-29 LAB — CUP PACEART REMOTE DEVICE CHECK
Brady Statistic RA Percent Paced: 63 %
Brady Statistic RV Percent Paced: 0 %
Date Time Interrogation Session: 20160816092859
Pulse Gen Model: 394931
Pulse Gen Serial Number: 68408118

## 2015-07-06 ENCOUNTER — Encounter: Payer: Self-pay | Admitting: Cardiology

## 2015-08-16 ENCOUNTER — Other Ambulatory Visit: Payer: Medicare Other

## 2015-08-17 ENCOUNTER — Other Ambulatory Visit: Payer: Medicare Other

## 2015-08-17 DIAGNOSIS — E119 Type 2 diabetes mellitus without complications: Secondary | ICD-10-CM

## 2015-08-17 DIAGNOSIS — I10 Essential (primary) hypertension: Secondary | ICD-10-CM

## 2015-08-17 DIAGNOSIS — F418 Other specified anxiety disorders: Secondary | ICD-10-CM

## 2015-08-17 DIAGNOSIS — E785 Hyperlipidemia, unspecified: Secondary | ICD-10-CM

## 2015-08-18 ENCOUNTER — Ambulatory Visit (INDEPENDENT_AMBULATORY_CARE_PROVIDER_SITE_OTHER): Payer: Medicare Other | Admitting: Internal Medicine

## 2015-08-18 ENCOUNTER — Encounter: Payer: Self-pay | Admitting: Internal Medicine

## 2015-08-18 VITALS — BP 140/92 | HR 69 | Temp 97.4°F | Resp 20 | Ht 71.0 in | Wt 165.2 lb

## 2015-08-18 DIAGNOSIS — E119 Type 2 diabetes mellitus without complications: Secondary | ICD-10-CM

## 2015-08-18 DIAGNOSIS — F329 Major depressive disorder, single episode, unspecified: Secondary | ICD-10-CM | POA: Diagnosis not present

## 2015-08-18 DIAGNOSIS — M542 Cervicalgia: Secondary | ICD-10-CM | POA: Insufficient documentation

## 2015-08-18 DIAGNOSIS — R109 Unspecified abdominal pain: Secondary | ICD-10-CM

## 2015-08-18 DIAGNOSIS — Z720 Tobacco use: Secondary | ICD-10-CM | POA: Diagnosis not present

## 2015-08-18 DIAGNOSIS — Z9181 History of falling: Secondary | ICD-10-CM | POA: Diagnosis not present

## 2015-08-18 DIAGNOSIS — I10 Essential (primary) hypertension: Secondary | ICD-10-CM | POA: Diagnosis not present

## 2015-08-18 DIAGNOSIS — G245 Blepharospasm: Secondary | ICD-10-CM | POA: Diagnosis not present

## 2015-08-18 DIAGNOSIS — F32A Depression, unspecified: Secondary | ICD-10-CM

## 2015-08-18 DIAGNOSIS — R609 Edema, unspecified: Secondary | ICD-10-CM

## 2015-08-18 DIAGNOSIS — E785 Hyperlipidemia, unspecified: Secondary | ICD-10-CM

## 2015-08-18 DIAGNOSIS — Z23 Encounter for immunization: Secondary | ICD-10-CM

## 2015-08-18 DIAGNOSIS — S0990XA Unspecified injury of head, initial encounter: Secondary | ICD-10-CM

## 2015-08-18 DIAGNOSIS — G4700 Insomnia, unspecified: Secondary | ICD-10-CM | POA: Diagnosis not present

## 2015-08-18 LAB — HEMOGLOBIN A1C
Est. average glucose Bld gHb Est-mCnc: 134 mg/dL
Hgb A1c MFr Bld: 6.3 % — ABNORMAL HIGH (ref 4.8–5.6)

## 2015-08-18 LAB — COMPREHENSIVE METABOLIC PANEL
ALT: 8 IU/L (ref 0–32)
AST: 11 IU/L (ref 0–40)
Albumin/Globulin Ratio: 2.1 (ref 1.1–2.5)
Albumin: 4 g/dL (ref 3.5–4.8)
Alkaline Phosphatase: 58 IU/L (ref 39–117)
BUN/Creatinine Ratio: 12 (ref 11–26)
BUN: 8 mg/dL (ref 8–27)
Bilirubin Total: 0.8 mg/dL (ref 0.0–1.2)
CO2: 27 mmol/L (ref 18–29)
Calcium: 9.4 mg/dL (ref 8.7–10.3)
Chloride: 98 mmol/L (ref 97–108)
Creatinine, Ser: 0.65 mg/dL (ref 0.57–1.00)
GFR calc Af Amer: 101 mL/min/{1.73_m2} (ref >59)
GFR calc non Af Amer: 88 mL/min/{1.73_m2} (ref >59)
Globulin, Total: 1.9 g/dL (ref 1.5–4.5)
Glucose: 96 mg/dL (ref 65–99)
Potassium: 4.9 mmol/L (ref 3.5–5.2)
Sodium: 139 mmol/L (ref 134–144)
Total Protein: 5.9 g/dL — ABNORMAL LOW (ref 6.0–8.5)

## 2015-08-18 LAB — LIPID PANEL
Chol/HDL Ratio: 2.7 ratio units (ref 0.0–4.4)
Cholesterol, Total: 117 mg/dL (ref 100–199)
HDL: 44 mg/dL (ref >39)
LDL Calculated: 57 mg/dL (ref 0–99)
Triglycerides: 81 mg/dL (ref 0–149)
VLDL Cholesterol Cal: 16 mg/dL (ref 5–40)

## 2015-08-18 LAB — TSH: TSH: 3.86 u[IU]/mL (ref 0.450–4.500)

## 2015-08-18 MED ORDER — HYDROCODONE-ACETAMINOPHEN 5-325 MG PO TABS: ORAL_TABLET | ORAL | Status: DC

## 2015-08-18 NOTE — Progress Notes (Signed)
Patient ID: Mary Higgins, female   DOB: 05-18-1941, 74 y.o.   MRN: [id number]    Facility  [facility]    Place of Service:   OFFICE    No Known Allergies  Chief Complaint  Patient presents with  . Medical Management of Chronic Issues    3 moonth follow-up for Hyperlipidemia, DM, Hypertension  . Immunizations    woud like flu shot today    HPI:  Flank pain  -  resolved   Type 2 diabetes mellitus without complication, without long-term current use of insulin ([facility]) - needs follow-up lab   Edema, unspecified type - stable at about 1+ bipedal   History of fall -fell again about one week ago. [place] backwards when she raised her head to drink the last cough syrup out of a bottle.   continue to fall backwards and hit the back of her head with a hematoma as a result. Also had a contusion to her buttocks and coccyx. Hit her left elbow against a piece of furniture. Continues to have a significant amount of neck discomfort since the fall.   Hyperlipidemia - needs follow-up lab   Essential hypertension - controlled   Insomnia - mirtazapine is not helping as much   Tobacco abuse - continues to smoke about one half pack per day. She says that she has made up her mind to stop smoking.   Cervicalgia - persistent neck discomfort since a fall about a week ago no radiation of pain into the shoulders or arms.   Depression due to head injury - chronic issue which seems to do well on her current dose of Paxil 20 mg daily   Blepharospasm - patient has seen Dr. Talbert Forest. He is willing to do Botox injections. He also recommended cataract surgery. She is scheduled to have right cataract done 09/13/2015. She anticipates having the left side done 2-3 weeks later.      Medications: Patient's Medications  New Prescriptions   No medications on file  Previous Medications   ASPIRIN 81 MG TABLET    Take 81 mg by mouth daily.   ATORVASTATIN (LIPITOR) 40 MG TABLET    TAKE 1 TABLET BY MOUTH DAILY   BACITRACIN-POLYMYXIN B (POLYSPORIN) OPHTHALMIC OINTMENT       CLONAZEPAM (KLONOPIN) 1 MG TABLET    TAKE 1 TABLET BY MOUTH TWICE DAILY TO HELP CONTROL SPASMS AT EYES   CLOTRIMAZOLE (LOTRIMIN) 1 % EXTERNAL SOLUTION    Apply daily to affected nails   HYDROCODONE-ACETAMINOPHEN (NORCO/VICODIN) 5-325 MG PER TABLET    Take 1 tablet every 6 hours as needed for pain   LOSARTAN (COZAAR) 100 MG TABLET    TK 1 T PO QD   METFORMIN (GLUCOPHAGE) 500 MG TABLET    TAKE 1 TABLET BY MOUTH WITH BREAKFAST AND 1 TABLET WITH SUPPER TO CONTROL DIABETES   METOPROLOL (LOPRESSOR) 50 MG TABLET    TAKE 1 TABLET BY MOUTH TWICE DAILY   MIDODRINE (PROAMATINE) 5 MG TABLET    Take 1 tablet (5 mg total) by mouth 3 (three) times daily.   MIRTAZAPINE (REMERON) 15 MG TABLET    TAKE 1 TABLET BY MOUTH EVERY NIGHT AT BEDTIME TO HELP PREVENT NERVES AND REST   MULTIPLE VITAMIN (MULTIVITAMIN) CAPSULE    Take 1 capsule by mouth daily.   PANTOPRAZOLE (PROTONIX) 40 MG TABLET    TAKE 1 TABLET BY MOUTH EVERY DAY TO REDUCE STOMACH ACID   PAROXETINE (PAXIL) 10 MG TABLET    TAKE 1  TABLET(10 MG) BY MOUTH DAILY   PAROXETINE (PAXIL) 20 MG TABLET    One daily to help depression   TEMAZEPAM (RESTORIL) 15 MG CAPSULE    Take 1 capsule (15 mg total) by mouth at bedtime as needed for sleep.  Modified Medications   No medications on file  Discontinued Medications   LOSARTAN (COZAAR) 50 MG TABLET    TK 1 T PO QD TO CONTROL BLOOD PRESSURE AND STRENGTHEN THE HEART   ZOSTAVAX 53299 UNT/0.65ML INJECTION        Review of Systems  Constitutional: Positive for appetite change (Decreased) and fatigue.  HENT: Positive for hearing loss.        Dentures  Eyes: Positive for visual disturbance (Corrective lenses).       Xerophthalmia  Respiratory: Positive for shortness of breath. Negative for cough.        History of chronic tobacco abuse  Cardiovascular: Positive for palpitations and leg swelling. Negative for chest pain.  Gastrointestinal: Negative for  abdominal pain, abdominal distention and anal bleeding.       Hemorrhoids  Endocrine: Negative.        History of diabetes  Genitourinary: Negative.   Musculoskeletal: Positive for gait problem.       History of falls and gait instability. Chronic right flank discomfort.  Skin: Negative.   Allergic/Immunologic: Negative.   Neurological: Positive for dizziness.       History of neurocardiogenic syncope. Memory disorder. History of intracerebral bleed in 2003. Subsequently had a small aneurysm treated by Dr. Estanislado Pandy with a coil. History of shunt to the brain in the left parietal area placed by Dr. Vertell Limber, neurosurgeon in 2003.  Hematological: Negative.   Psychiatric/Behavioral: Positive for confusion, sleep disturbance, dysphoric mood and decreased concentration.    Filed Vitals:   08/18/15 1202  BP: 140/92  Pulse: 69  Temp: 97.4 F (36.3 C)  TempSrc: Oral  Resp: 20  Height: _0  (1.803 m)  Weight: 165 lb 3.2 oz (74.934 kg)  SpO2: 97%   Body mass index is 23.05 kg/(m^2).  Physical Exam  Constitutional: She is oriented to person, place, and time. She appears well-developed and well-nourished. No distress.  HENT:  Right Ear: External ear normal.  Left Ear: External ear normal.  Nose: Nose normal.  Mouth/Throat: Oropharynx is clear and moist. No oropharyngeal exudate.  Ventriculoperitoneal shunt. Upper dentures  Eyes: Conjunctivae and EOM are normal. Pupils are equal, round, and reactive to light. No scleral icterus.  Bilateral  Neck: No JVD present. No tracheal deviation present. No thyromegaly present.  Cardiovascular: Normal rate, regular rhythm, normal heart sounds and intact distal pulses.  Exam reveals no gallop and no friction rub.   No murmur heard. Pulmonary/Chest: Effort normal. No respiratory distress. She has no wheezes. She has no rales. She exhibits no tenderness.  Pacemaker right upper chest Atrophic breasts. Breasts are previous breast cancer surgery    Abdominal: She exhibits no distension and no mass. There is no tenderness.  Genitourinary:  History of hemorrhoids  Musculoskeletal: Normal range of motion. She exhibits edema (Approximately 1+ bilaterally in lower legs and ankles). She exhibits no tenderness.  Lymphadenopathy:    She has no cervical adenopathy.  Neurological: She is alert and oriented to person, place, and time. No cranial nerve deficit. Coordination normal.  Skin: No rash noted. She is not diaphoretic. No erythema. No pallor.  Psychiatric: She has a normal mood and affect. Her behavior is normal. Judgment and thought content normal.  Labs reviewed: Lab Summary Latest Ref Rng 08/17/2015 01/20/2015  Hemoglobin 13.0-17.0 g/dL (None) (None)  Hematocrit 39.0-52.0 % (None) (None)  White count - (None) (None)  Platelet count - (None) (None)  Sodium 134 - 144 mmol/L 139 136  Potassium 3.5 - 5.2 mmol/L 4.9 4.7  Calcium 8.7 - 10.3 mg/dL 9.4 9.4  Phosphorus - (None) (None)  Creatinine 0.57 - 1.00 mg/dL 0.65 0.53(L)  AST 0 - 40 IU/L 11 11  Alk Phos 39 - 117 IU/L 58 61  Bilirubin 0.0 - 1.2 mg/dL 0.8 0.4  Glucose 65 - 99 mg/dL 96 94  Cholesterol - (None) (None)  HDL cholesterol >39 mg/dL 44 50  Triglycerides 0 - 149 mg/dL 81 72  LDL Direct - (None) (None)  LDL Calc 0 - 99 mg/dL 57 57  Total protein - (None) (None)  Albumin 3.5 - 4.8 g/dL 4.0 4.5   Lab Results  Component Value Date   TSH 3.860 08/17/2015   Lab Results  Component Value Date   BUN 8 08/17/2015   Lab Results  Component Value Date   HGBA1C 6.3* 08/17/2015    Assessment/Plan  1. Flank pain  resolved   2. Type 2 diabetes mellitus without complication, without long-term current use of insulin ([facility]) - Comprehensive metabolic panel; Future - Hemoglobin A1c; Future  3. Edema, unspecified type - Comprehensive metabolic panel; Future  4. History of fall  Recurrent falls. Unsteady on feet as result of previous brain injury.  .5. Hyperlidemia -  Lipid panel; Future  6. Essential hypertension - Comprehensive metabolic panel; Future  7. Insomnia Continue current mirtazapine   8. Tobacco abuse  Congratulated on her decision to stop smoking. Advised her to act on her ideas as soon as possible.  9. Cerviicalgia - HYDROcodone-acetaminophen (NORCO/VICODIN) 5-325 MG tablet; Take 1 tablet every 6 hours as needed for pain  Dispense: 100 tablet; Refill: 0  10. Depression due to head injury Continue current dose of Paxil  1 1. Blepharospasm Continue to see Dr. Talbert Forest

## 2015-08-18 NOTE — Patient Instructions (Signed)
You were given the flu vaccine and Prevnar (pneumonia vaccine).

## 2015-08-24 ENCOUNTER — Other Ambulatory Visit: Payer: Self-pay | Admitting: Internal Medicine

## 2015-08-30 ENCOUNTER — Other Ambulatory Visit: Payer: Self-pay | Admitting: Internal Medicine

## 2015-09-22 ENCOUNTER — Ambulatory Visit (INDEPENDENT_AMBULATORY_CARE_PROVIDER_SITE_OTHER): Payer: Medicare Other | Admitting: *Deleted

## 2015-09-22 DIAGNOSIS — I495 Sick sinus syndrome: Secondary | ICD-10-CM

## 2015-09-22 NOTE — Progress Notes (Signed)
Remote pacemaker transmission.   

## 2015-09-23 LAB — CUP PACEART REMOTE DEVICE CHECK
Brady Statistic RA Percent Paced: 66 %
Brady Statistic RV Percent Paced: 1 %
Date Time Interrogation Session: 20161110113725
Implantable Lead Implant Date: 20070830
Implantable Lead Implant Date: 20070830
Implantable Lead Location: 753859
Implantable Lead Location: 753860
Lead Channel Impedance Value: 429 Ohm
Lead Channel Impedance Value: 585 Ohm
Lead Channel Pacing Threshold Amplitude: 0.5 V
Lead Channel Pacing Threshold Amplitude: 1.5 V
Lead Channel Pacing Threshold Pulse Width: 0.4 ms
Lead Channel Pacing Threshold Pulse Width: 0.4 ms
Lead Channel Sensing Intrinsic Amplitude: 11.8 mV
Lead Channel Sensing Intrinsic Amplitude: 2.4 mV
Pulse Gen Model: 394931
Pulse Gen Serial Number: 68408118

## 2015-09-24 ENCOUNTER — Encounter: Payer: Self-pay | Admitting: Cardiology

## 2015-09-27 ENCOUNTER — Other Ambulatory Visit: Payer: Self-pay | Admitting: *Deleted

## 2015-09-27 ENCOUNTER — Other Ambulatory Visit: Payer: Self-pay | Admitting: Internal Medicine

## 2015-09-27 MED ORDER — PANTOPRAZOLE SODIUM 40 MG PO TBEC: DELAYED_RELEASE_TABLET | ORAL | Status: DC

## 2015-09-27 NOTE — Telephone Encounter (Signed)
[facility].  

## 2015-09-28 ENCOUNTER — Other Ambulatory Visit: Payer: Self-pay | Admitting: Internal Medicine

## 2015-10-26 ENCOUNTER — Other Ambulatory Visit: Payer: Self-pay | Admitting: Internal Medicine

## 2015-11-16 ENCOUNTER — Other Ambulatory Visit: Payer: Self-pay | Admitting: Internal Medicine

## 2015-11-22 ENCOUNTER — Other Ambulatory Visit: Payer: Self-pay | Admitting: Internal Medicine

## 2015-11-25 ENCOUNTER — Other Ambulatory Visit: Payer: Self-pay | Admitting: Internal Medicine

## 2015-11-26 ENCOUNTER — Other Ambulatory Visit: Payer: Self-pay | Admitting: Internal Medicine

## 2015-12-16 ENCOUNTER — Encounter: Payer: Self-pay | Admitting: Gastroenterology

## 2015-12-20 ENCOUNTER — Other Ambulatory Visit: Payer: Medicare Other

## 2015-12-22 ENCOUNTER — Encounter: Payer: Self-pay | Admitting: Internal Medicine

## 2015-12-22 ENCOUNTER — Ambulatory Visit (INDEPENDENT_AMBULATORY_CARE_PROVIDER_SITE_OTHER): Payer: Medicare Other | Admitting: *Deleted

## 2015-12-22 ENCOUNTER — Ambulatory Visit: Payer: Medicare Other | Admitting: Internal Medicine

## 2015-12-22 DIAGNOSIS — I495 Sick sinus syndrome: Secondary | ICD-10-CM | POA: Diagnosis not present

## 2015-12-22 DIAGNOSIS — Z0289 Encounter for other administrative examinations: Secondary | ICD-10-CM

## 2015-12-22 NOTE — Progress Notes (Signed)
Remote pacemaker transmission.   

## 2015-12-24 ENCOUNTER — Other Ambulatory Visit: Payer: Self-pay | Admitting: Internal Medicine

## 2015-12-29 ENCOUNTER — Telehealth: Payer: Self-pay

## 2015-12-29 ENCOUNTER — Encounter: Payer: Medicare Other | Admitting: Internal Medicine

## 2015-12-29 NOTE — Telephone Encounter (Signed)
Letter mailed regarding overdue mammogram.

## 2016-01-05 ENCOUNTER — Encounter: Payer: Self-pay | Admitting: Internal Medicine

## 2016-01-05 ENCOUNTER — Telehealth: Payer: Self-pay | Admitting: Internal Medicine

## 2016-01-05 NOTE — Telephone Encounter (Signed)
Called pt regarding [place] [facility] appointment. Not able to leave a message...cdavis

## 2016-01-09 ENCOUNTER — Other Ambulatory Visit: Payer: Self-pay | Admitting: Internal Medicine

## 2016-01-21 ENCOUNTER — Other Ambulatory Visit: Payer: Self-pay | Admitting: Internal Medicine

## 2016-01-26 ENCOUNTER — Encounter: Payer: Self-pay | Admitting: Cardiology

## 2016-01-26 ENCOUNTER — Other Ambulatory Visit: Payer: Self-pay | Admitting: Internal Medicine

## 2016-01-26 LAB — CUP PACEART REMOTE DEVICE CHECK
Brady Statistic RA Percent Paced: 70 %
Brady Statistic RV Percent Paced: 1 %
Date Time Interrogation Session: 20170315085511
Implantable Lead Implant Date: 20070830
Implantable Lead Implant Date: 20070830
Implantable Lead Location: 753859
Implantable Lead Location: 753860
Lead Channel Impedance Value: 410 Ohm
Lead Channel Impedance Value: 663 Ohm
Lead Channel Pacing Threshold Amplitude: 0.5 V
Lead Channel Pacing Threshold Amplitude: 2 V
Lead Channel Pacing Threshold Pulse Width: 0.4 ms
Lead Channel Pacing Threshold Pulse Width: 0.4 ms
Lead Channel Sensing Intrinsic Amplitude: 1.5 mV
Lead Channel Sensing Intrinsic Amplitude: 9.9 mV
Lead Channel Setting Pacing Amplitude: 1.5 V
Lead Channel Setting Pacing Amplitude: 3.6 V
Lead Channel Setting Pacing Pulse Width: 0.4 ms
Lead Channel Setting Sensing Sensitivity: 2 mV
Pulse Gen Model: 394931
Pulse Gen Serial Number: 68408118

## 2016-02-01 ENCOUNTER — Encounter: Payer: Medicare Other | Admitting: Internal Medicine

## 2016-02-01 ENCOUNTER — Encounter: Payer: Self-pay | Admitting: Internal Medicine

## 2016-02-13 ENCOUNTER — Other Ambulatory Visit: Payer: Self-pay | Admitting: Internal Medicine

## 2016-02-25 ENCOUNTER — Other Ambulatory Visit: Payer: Self-pay | Admitting: Internal Medicine

## 2016-03-14 ENCOUNTER — Other Ambulatory Visit: Payer: Self-pay | Admitting: Internal Medicine

## 2016-03-14 ENCOUNTER — Encounter: Payer: Self-pay | Admitting: Internal Medicine

## 2016-03-14 ENCOUNTER — Ambulatory Visit (INDEPENDENT_AMBULATORY_CARE_PROVIDER_SITE_OTHER): Payer: Medicare Other | Admitting: Internal Medicine

## 2016-03-14 VITALS — BP 130/80 | HR 66 | Temp 97.3°F | Ht 68.5 in | Wt 169.0 lb

## 2016-03-14 DIAGNOSIS — Z720 Tobacco use: Secondary | ICD-10-CM

## 2016-03-14 DIAGNOSIS — R609 Edema, unspecified: Secondary | ICD-10-CM

## 2016-03-14 DIAGNOSIS — Z0000 Encounter for general adult medical examination without abnormal findings: Secondary | ICD-10-CM | POA: Diagnosis not present

## 2016-03-14 DIAGNOSIS — Z9181 History of falling: Secondary | ICD-10-CM

## 2016-03-14 DIAGNOSIS — E785 Hyperlipidemia, unspecified: Secondary | ICD-10-CM

## 2016-03-14 DIAGNOSIS — G245 Blepharospasm: Secondary | ICD-10-CM | POA: Diagnosis not present

## 2016-03-14 DIAGNOSIS — I10 Essential (primary) hypertension: Secondary | ICD-10-CM

## 2016-03-14 DIAGNOSIS — E119 Type 2 diabetes mellitus without complications: Secondary | ICD-10-CM

## 2016-03-14 MED ORDER — TRAMADOL HCL 50 MG PO TABS: ORAL_TABLET | ORAL | Status: DC

## 2016-03-14 NOTE — Progress Notes (Signed)
Patient ID: Su Grand, female   DOB: [birth year], 75 y.o.   MRN: [id number]    HISTORY AND PHYSICAL  Location:    [facility]   Place of Service:   OFFICE  Extended Emergency Contact Information Primary Emergency Contact: Stanley,Joseph Address: [place] Phone: [phone] Relation: Son Secondary Emergency Contact: Brantley Fling [place] Phone: [phone] Relation: Daughter  Advanced Directive information Does patient have an advance directive?: No, Would patient like information on creating an advanced directive?: No - patient declined information  Chief Complaint  Patient presents with  . Annual Exam    Wellness Exam   . Medical Management of Chronic Issues    blood sugar, blood pressure, cholesterol, edema. Here with son [name]  . MMSE    27/30 passed clock drawing    HPI:  [place] [address] 3 weeks ago getting out of the shower and hurt right ribs.  Memory about the same  Keratoacanthoma of the chest. Would like to have removed.  Type 2 diabetes mellitus without complication, without long-term current use of insulin ([facility]) - no recent lab  Edema, unspecified type - unchanged  History of fall - recurrent problem related to prior SAH  Hyperlipidemia - no recent lab  Essential hypertension - controlled  Tobacco abuse - 1/2 PPD  Blepharospasm - improved    Past Medical History  Diagnosis Date  . Neurocardiogenic syncope     a. s/p Biotronik dual chamber pacemaker  . COPD (chronic obstructive pulmonary disease) ([facility])   . Carotid bruit     Bilateral. Mild to moderate disease on LEFT and mild on the RIGHT in 2012  . Diabetes mellitus without complication ([facility])     AODM  . Hyperlipidemia   . Intracerebral bleed ([facility]) 2003    Remote intracerebral blled and coiling by Dr. Estanislado Pandy for cerebral aneurysm ans a shunt placed by Dr. Vertell Limber remotely. No seizure disorder and this happened in 2003  with an anterior communicating aneurysm.  Marland Kitchen Anxiety and depression     Hx of  . Breast cancer ([facility])     s/p lumpectomy  . Type II or unspecified type diabetes mellitus without mention of complication, not stated as uncontrolled    . Tobacco abuse    . Insomnia    . Hypertension    . GERD (gastroesophageal reflux disease)    . Blepharospasm    . Sinus node dysfunction ([facility])    . Fuchs' corneal dystrophy     Past Surgical History  Procedure Laterality Date  . Pacemaker insertion  07/12/2006    [employer] XL DR  -  Dr. Tami Ribas  . Breast lumpectomy Right     For cancer, no recurrence  . Partial hysterectomy    . Cholecystectomy    . Aneurysm coiling  2003    Cerebral aneurysm ;Dr. Luanne Bras  . Ventriculoperitoneal shunt  2003    Dr. Erline Levine  . Pacemaker generator change N/A 09/25/2014    BTK dual chamber pacemkaer implanted by Dr Rayann Heman    Patient Care Team: Estill Dooms, MD as PCP - General (Internal Medicine) Patsey Berthold, NP as Nurse Practitioner (Cardiology) Darleen Crocker, MD as Consulting Physician (Ophthalmology) Thompson Grayer, MD as Consulting Physician (Cardiology)  Social History   Social History  . Marital Status: Legally Separated    Spouse Name: N/A  . Number of Children: N/A  .  Years of Education: N/A   Occupational History  . Not on file.   Social History Main Topics  . Smoking status: Current Every Day Smoker -- 0.50 packs/day for 15 years    Types: Cigarettes  . Smokeless tobacco: Never Used  . Alcohol Use: No  . Drug Use: No  . Sexual Activity: Not on file   Other Topics Concern  . Not on file   Social History Narrative    reports that she has been smoking Cigarettes.  She has a 7.5 pack-year smoking history. She has never used smokeless tobacco. She reports that she does not drink alcohol or use illicit drugs.  Family History  Problem Relation Age of Onset  . Hypertension    . Cancer Mother   . Heart attack Father      Family Status  Relation Status Death Age  . Mother Deceased 94  . Father Deceased 61  . Brother Alive   . Brother Alive   . Son Alive   . Daughter Alive   . Son Alive     Immunization History  Administered Date(s) Administered  . Influenza,inj,Quad PF,36+ Mos 08/18/2015  . Pneumococcal Conjugate-13 08/18/2015  . Zoster 03/16/2015    Allergies  Allergen Reactions  . Oxaprozin Rash    Medications: Patient's Medications  New Prescriptions   No medications on file  Previous Medications   ASPIRIN 81 MG TABLET    Take 81 mg by mouth daily.   ATORVASTATIN (LIPITOR) 40 MG TABLET    TAKE 1 TABLET BY MOUTH DAILY   BACITRACIN-POLYMYXIN B (POLYSPORIN) OPHTHALMIC OINTMENT       CLONAZEPAM (KLONOPIN) 1 MG TABLET    TAKE 1 TABLET BY MOUTH TWICE DAILY TO HELP CONTROL SPASMS AT EYES   CLOTRIMAZOLE (LOTRIMIN) 1 % EXTERNAL SOLUTION    Apply daily to affected nails   HYDROCODONE-ACETAMINOPHEN (NORCO/VICODIN) 5-325 MG TABLET    Take 1 tablet every 6 hours as needed for pain   LOSARTAN (COZAAR) 100 MG TABLET    TAKE 1 TABLET BY MOUTH EVERY DAY   METFORMIN (GLUCOPHAGE) 500 MG TABLET    TAKE 1 TABLET BY MOUTH WITH BREAKFAST AND 1 TABLET WITH SUPPER TO CONTROL DIABETES   METOPROLOL (LOPRESSOR) 50 MG TABLET    TAKE 1 TABLET BY MOUTH TWICE DAILY   MIDODRINE (PROAMATINE) 5 MG TABLET    TAKE 1 TABLET BY MOUTH THREE TIMES DAILY   MIRTAZAPINE (REMERON) 15 MG TABLET    TAKE 1 TABLET BY MOUTH EVERY NIGHT AT BEDTIME   MULTIPLE VITAMIN (MULTIVITAMIN) CAPSULE    Take 1 capsule by mouth daily.   PANTOPRAZOLE (PROTONIX) 40 MG TABLET    TAKE 1 TABLET BY MOUTH EVERY DAY TO REDUCE STOMACH ACID   PAROXETINE (PAXIL) 20 MG TABLET    TAKE 1 TABLET BY MOUTH DAILY TO HELP DEPRESSION   TEMAZEPAM (RESTORIL) 15 MG CAPSULE    Take 1 capsule (15 mg total) by mouth at bedtime as needed for sleep.  Modified Medications   No medications on file  Discontinued Medications   No medications on file    Review of Systems   Constitutional: Positive for appetite change (Decreased) and fatigue.  HENT: Positive for hearing loss.        Dentures  Eyes: Positive for visual disturbance (Corrective lenses).       Xerophthalmia  Respiratory: Positive for shortness of breath. Negative for cough.        History of chronic tobacco abuse; 1/2 PPD. Tender  righr chest due to fall and trauma.  Cardiovascular: Positive for palpitations and leg swelling. Negative for chest pain.  Gastrointestinal: Negative for abdominal pain, abdominal distention and anal bleeding.       Hemorrhoids  Endocrine: Negative.        History of diabetes  Genitourinary: Positive for frequency and difficulty urinating.       Incontinence.  Musculoskeletal: Positive for gait problem.       History of falls and gait instability. Chronic right flank discomfort.  Skin: Negative.   Allergic/Immunologic: Negative.   Neurological: Positive for dizziness.       History of neurocardiogenic syncope. Memory disorder. History of intracerebral bleed in 2003. Subsequently had a small aneurysm treated by Dr. Corliss Skains with a coil. History of shunt to the brain in the left parietal area placed by Dr. Venetia Maxon, neurosurgeon in 2003.  Hematological: Negative.   Psychiatric/Behavioral: Positive for confusion, sleep disturbance, dysphoric mood and decreased concentration.    Filed Vitals:   03/14/16 1531  BP: 130/80  Pulse: 66  Temp: 97.3 F (36.3 C)  TempSrc: Oral  Height: 5' 8.5" (1.74 m)  Weight: 169 lb (76.658 kg)  SpO2: 95%   Body mass index is 25.32 kg/(m^2). Filed Weights   03/14/16 1531  Weight: 169 lb (76.658 kg)     Physical Exam  Constitutional: She is oriented to person, place, and time. She appears well-developed and well-nourished. No distress.  HENT:  Right Ear: External ear normal.  Left Ear: External ear normal.  Nose: Nose normal.  Mouth/Throat: Oropharynx is clear and moist. No oropharyngeal exudate.  Ventriculoperitoneal  shunt. Upper dentures  Eyes: Conjunctivae and EOM are normal. Pupils are equal, round, and reactive to light. No scleral icterus.  Bilateral  Neck: No JVD present. No tracheal deviation present. No thyromegaly present.  Cardiovascular: Normal rate, regular rhythm, normal heart sounds and intact distal pulses.  Exam reveals no gallop and no friction rub.   No murmur heard. Pulmonary/Chest: Effort normal. No respiratory distress. She has no wheezes. She has no rales. She exhibits no tenderness.  Pacemaker right upper chest Atrophic breasts. Breasts are previous breast cancer surgery Tender right chest wall.  Abdominal: She exhibits no distension and no mass. There is no tenderness.  Genitourinary:  History of hemorrhoids  Musculoskeletal: Normal range of motion. She exhibits edema (Approximately 1+ bilaterally in lower legs and ankles). She exhibits no tenderness.  Lymphadenopathy:    She has no cervical adenopathy.  Neurological: She is alert and oriented to person, place, and time. No cranial nerve deficit. Coordination normal.  03/14/16 MMSE 28/30.Passed clock drawing.  Skin: No rash noted. She is not diaphoretic. No erythema. No pallor.  Psychiatric: She has a normal mood and affect. Her behavior is normal. Judgment and thought content normal.    Labs reviewed: Lab Summary Latest Ref Rng 08/17/2015  Hemoglobin 13.0-17.0 g/dL (None)  Hematocrit 09.8-28.6 % (None)  White count - (None)  Platelet count - (None)  Sodium 134 - 144 mmol/L 139  Potassium 3.5 - 5.2 mmol/L 4.9  Calcium 8.7 - 10.3 mg/dL 9.4  Phosphorus - (None)  Creatinine 0.57 - 1.00 mg/dL 7.51  AST 0 - 40 IU/L 11  Alk Phos 39 - 117 IU/L 58  Bilirubin 0.0 - 1.2 mg/dL 0.8  Glucose 65 - 99 mg/dL 96  Cholesterol - (None)  HDL cholesterol >39 mg/dL 44  Triglycerides 0 - 982 mg/dL 81  LDL Direct - (None)  LDL Calc 0 - 99 mg/dL  57  Total protein - (None)  Albumin 3.5 - 4.8 g/dL 4.0   Lab Results  Component Value Date     BUN 8 08/17/2015   Lab Results  Component Value Date   HGBA1C 6.3* 08/17/2015   Lab Results  Component Value Date   TSH 3.860 08/17/2015     Assessment/Plan 1. Type 2 diabetes mellitus without complication, without long-term current use of insulin ([facility]) -A1c, CMP  2. Edema, unspecified type unchanged  3. History of fall Continues to be high risk for falling  4. Hyperlipidemia -Lipids  5. Essential hypertension -CMP  6. Tobacco abuse -stop smoking  7. Blepharospasm improved

## 2016-03-14 NOTE — Progress Notes (Signed)
Patient ID: Mary Higgins, female   DOB: 1941-10-22, 75 y.o.   MRN: [id number] MMSE 27/30 passed clock drawing

## 2016-03-14 NOTE — Addendum Note (Signed)
Addended by: Eilene Ghazi on: 03/14/2016 05:03 PM   Modules accepted: Orders, SmartSet

## 2016-03-15 LAB — COMPREHENSIVE METABOLIC PANEL
ALT: 16 IU/L (ref 0–32)
AST: 14 IU/L (ref 0–40)
Albumin/Globulin Ratio: 1.8 (ref 1.2–2.2)
Albumin: 4.3 g/dL (ref 3.5–4.8)
Alkaline Phosphatase: 88 IU/L (ref 39–117)
BUN/Creatinine Ratio: 12 (ref 12–28)
BUN: 7 mg/dL — ABNORMAL LOW (ref 8–27)
Bilirubin Total: 0.5 mg/dL (ref 0.0–1.2)
CO2: 25 mmol/L (ref 18–29)
Calcium: 8.8 mg/dL (ref 8.7–10.3)
Chloride: 99 mmol/L (ref 96–106)
Creatinine, Ser: 0.58 mg/dL (ref 0.57–1.00)
GFR calc Af Amer: 105 mL/min/{1.73_m2} (ref >59)
GFR calc non Af Amer: 91 mL/min/{1.73_m2} (ref >59)
Globulin, Total: 2.4 g/dL (ref 1.5–4.5)
Glucose: 79 mg/dL (ref 65–99)
Potassium: 4.3 mmol/L (ref 3.5–5.2)
Sodium: 138 mmol/L (ref 134–144)
Total Protein: 6.7 g/dL (ref 6.0–8.5)

## 2016-03-15 LAB — LIPID PANEL
Chol/HDL Ratio: 2.9 ratio units (ref 0.0–4.4)
Cholesterol, Total: 135 mg/dL (ref 100–199)
HDL: 46 mg/dL (ref >39)
LDL Calculated: 58 mg/dL (ref 0–99)
Triglycerides: 155 mg/dL — ABNORMAL HIGH (ref 0–149)
VLDL Cholesterol Cal: 31 mg/dL (ref 5–40)

## 2016-03-15 LAB — MICROALBUMIN, URINE: Microalbumin, Urine: 4.5 ug/mL

## 2016-03-15 LAB — HEMOGLOBIN A1C

## 2016-03-16 LAB — HEMOGLOBIN A1C
Est. average glucose Bld gHb Est-mCnc: 128 mg/dL
Hgb A1c MFr Bld: 6.1 % — ABNORMAL HIGH (ref 4.8–5.6)

## 2016-03-23 ENCOUNTER — Telehealth: Payer: Self-pay | Admitting: *Deleted

## 2016-03-23 DIAGNOSIS — M542 Cervicalgia: Secondary | ICD-10-CM

## 2016-03-23 DIAGNOSIS — R109 Unspecified abdominal pain: Secondary | ICD-10-CM

## 2016-03-23 NOTE — Telephone Encounter (Signed)
Patient called and stated that she was seen 2 weeks ago for fall. Patient stated that the pain medication you prescribed is making her sick, causing nausea and vomiting. Patient stated that she fell again and her side still hurts. Patient does not want an appointment but would like something else for pain. Please Advise.

## 2016-03-24 MED ORDER — HYDROCODONE-ACETAMINOPHEN 5-325 MG PO TABS: ORAL_TABLET | ORAL | Status: DC

## 2016-03-24 NOTE — Telephone Encounter (Signed)
Stop tramadol. She has had hydrocodone/ APAP 5/325 in the past. She can use this if she still has some, or if she is out, call in order for 50 tablets. Take one every 6 hours if needed for pain.

## 2016-03-24 NOTE — Telephone Encounter (Signed)
Patient notified and agreed. Rx printed for pick up. Patient is going to see who she can have to come pick up Rx.

## 2016-03-29 ENCOUNTER — Other Ambulatory Visit: Payer: Self-pay | Admitting: Internal Medicine

## 2016-03-29 NOTE — Addendum Note (Signed)
Addended by: Estill Dooms on: 03/29/2016 10:47 AM   Modules accepted: Level of Service

## 2016-04-17 ENCOUNTER — Encounter: Payer: Self-pay | Admitting: Internal Medicine

## 2016-04-17 ENCOUNTER — Ambulatory Visit (INDEPENDENT_AMBULATORY_CARE_PROVIDER_SITE_OTHER): Payer: Medicare Other | Admitting: Internal Medicine

## 2016-04-17 ENCOUNTER — Other Ambulatory Visit: Payer: Self-pay

## 2016-04-17 VITALS — BP 162/90 | HR 65 | Ht 68.5 in | Wt 173.6 lb

## 2016-04-17 DIAGNOSIS — I10 Essential (primary) hypertension: Secondary | ICD-10-CM | POA: Diagnosis not present

## 2016-04-17 DIAGNOSIS — R55 Syncope and collapse: Secondary | ICD-10-CM | POA: Diagnosis not present

## 2016-04-17 DIAGNOSIS — I495 Sick sinus syndrome: Secondary | ICD-10-CM

## 2016-04-17 DIAGNOSIS — R269 Unspecified abnormalities of gait and mobility: Secondary | ICD-10-CM

## 2016-04-17 DIAGNOSIS — R296 Repeated falls: Secondary | ICD-10-CM

## 2016-04-17 DIAGNOSIS — Z950 Presence of cardiac pacemaker: Secondary | ICD-10-CM | POA: Diagnosis not present

## 2016-04-17 LAB — CUP PACEART INCLINIC DEVICE CHECK
Brady Statistic RA Percent Paced: 71 %
Brady Statistic RV Percent Paced: 1 %
Date Time Interrogation Session: 20170605203200
Implantable Lead Implant Date: 20070830
Implantable Lead Implant Date: 20070830
Implantable Lead Location: 753859
Implantable Lead Location: 753860
Lead Channel Impedance Value: 429 Ohm
Lead Channel Impedance Value: 585 Ohm
Lead Channel Pacing Threshold Amplitude: 0.6 V
Lead Channel Pacing Threshold Amplitude: 2.2 V
Lead Channel Pacing Threshold Pulse Width: 0.4 ms
Lead Channel Pacing Threshold Pulse Width: 0.4 ms
Lead Channel Sensing Intrinsic Amplitude: 11.2 mV
Lead Channel Sensing Intrinsic Amplitude: 2.5 mV
Lead Channel Setting Pacing Amplitude: 1.5 V
Lead Channel Setting Pacing Amplitude: 3.6 V
Lead Channel Setting Pacing Pulse Width: 0.4 ms
Pulse Gen Model: 394931
Pulse Gen Serial Number: 68408118

## 2016-04-17 NOTE — Progress Notes (Signed)
Primary Cardiologist:  Previously Dr Rollene Fare  Mary Higgins is a 75 y.o. female with a h/o neurocardiogenic syncope with rate drop/ bradycardia observed on tilt testing sp PPM (SJM) by Dr Tami Ribas who presents today to follow-up in the Electrophysiology device clinic.  She continues to have postural weakness and falls.  She has fallen 3 times this week and typically falls about once per month.  She denies presyncope or syncope with the episodes.  She states that she just gets weak and falls.   She reports chronic exertional SOB. She continues to smoke.  Today, she  denies symptoms of palpitations, chest pain,  orthopnea, PND, lower extremity edema, dizziness,  or neurologic sequela.  The patientis tolerating medications without difficulties and is otherwise without complaint today.   Past Medical History  Diagnosis Date  . Neurocardiogenic syncope     a. s/p Biotronik dual chamber pacemaker  . COPD (chronic obstructive pulmonary disease) ([place])   . Carotid bruit     Bilateral. Mild to moderate disease on LEFT and mild on the RIGHT in 2012  . Diabetes mellitus without complication ([place])     AODM  . Hyperlipidemia   . Intracerebral bleed ([place]) 2003    Remote intracerebral blled and coiling by Dr. Estanislado Pandy for cerebral aneurysm ans a shunt placed by Dr. Vertell Limber remotely. No seizure disorder and this happened in 2003 with an anterior communicating aneurysm.  Marland Kitchen Anxiety and depression     Hx of  . Breast cancer ([address])     s/p lumpectomy  . Type II or unspecified type diabetes mellitus without mention of complication, not stated as uncontrolled    . Tobacco abuse    . Insomnia    . Hypertension    . GERD (gastroesophageal reflux disease)    . Blepharospasm    . Sinus node dysfunction (HCC)    . Fuchs' corneal dystrophy    Past Surgical History  Procedure Laterality Date  . Pacemaker insertion  07/12/2006    [employer] XL DR  -  Dr. Tami Ribas  . Breast lumpectomy Right     For  cancer, no recurrence  . Partial hysterectomy    . Cholecystectomy    . Aneurysm coiling  2003    Cerebral aneurysm ;Dr. Luanne Bras  . Ventriculoperitoneal shunt  2003    Dr. Erline Levine  . Pacemaker generator change N/A 09/25/2014    BTK dual chamber pacemkaer implanted by Dr Rayann Heman    Social History   Social History  . Marital Status: Legally Separated    Spouse Name: N/A  . Number of Children: N/A  . Years of Education: N/A   Occupational History  . Not on file.   Social History Main Topics  . Smoking status: Current Every Day Smoker -- 0.50 packs/day for 15 years    Types: Cigarettes  . Smokeless tobacco: Never Used  . Alcohol Use: No  . Drug Use: No  . Sexual Activity: Not on file   Other Topics Concern  . Not on file   Social History Narrative    Family History  Problem Relation Age of Onset  . Hypertension    . Cancer Mother   . Heart attack Father     Allergies  Allergen Reactions  . Oxaprozin Rash    Current Outpatient Prescriptions  Medication Sig Dispense Refill  . aspirin 81 MG tablet Take 81 mg by mouth daily.    Marland Kitchen atorvastatin (LIPITOR) 40 MG  tablet TAKE 1 TABLET BY MOUTH DAILY 90 tablet 3  . clonazePAM (KLONOPIN) 1 MG tablet TAKE 1 TABLET BY MOUTH TWICE DAILY TO HELP CONTROL SPASMS AT EYES 60 tablet 0  . clotrimazole (LOTRIMIN) 1 % external solution Apply daily to affected nails 30 mL 3  . HYDROcodone-acetaminophen (NORCO/VICODIN) 5-325 MG tablet Take 1 tablet every 6 hours as needed for pain 50 tablet 0  . losartan (COZAAR) 100 MG tablet TAKE 1 TABLET BY MOUTH EVERY DAY 90 tablet 3  . metFORMIN (GLUCOPHAGE) 500 MG tablet TAKE 1 TABLET BY MOUTH WITH BREAKFAST AND 1 TABLET WITH SUPPER TO CONTROL DIABETES 180 tablet 3  . metoprolol (LOPRESSOR) 50 MG tablet TAKE 1 TABLET BY MOUTH TWICE DAILY 60 tablet 3  . midodrine (PROAMATINE) 5 MG tablet TAKE 1 TABLET BY MOUTH THREE TIMES DAILY 90 tablet 2  . mirtazapine (REMERON) 15 MG tablet TAKE 1  TABLET BY MOUTH EVERY NIGHT AT BEDTIME 90 tablet 2  . Multiple Vitamin (MULTIVITAMIN) capsule Take 1 capsule by mouth daily.    . pantoprazole (PROTONIX) 40 MG tablet TAKE 1 TABLET BY MOUTH EVERY DAY TO REDUCE STOMACH ACID 30 tablet 5  . temazepam (RESTORIL) 15 MG capsule Take 1 capsule (15 mg total) by mouth at bedtime as needed for sleep. 30 capsule 0   No current facility-administered medications for this visit.    ROS- all systems are reviewed and negative except as per HPI  Physical Exam: Filed Vitals:   04/17/16 1602  BP: 162/90  Pulse: 65  Height: 5' 8.5" (1.74 m)  Weight: 173 lb 9.6 oz (78.744 kg)    GEN- The patient is well appearing, alert and oriented x 3 today.   Head- normocephalic, atraumatic Eyes-  Sclera clear, conjunctiva pink Ears- hearing intact Oropharynx- clear Neck- supple,  Lungs- Clear to ausculation bilaterally with a prolonged expiratory phase, normal work of breathing Chest- pacemaker pocket is well healed Heart- Regular rate and rhythm, no murmurs, rubs or gallops, PMI not laterally displaced GI- soft, NT, ND, + BS Extremities- no clubbing, cyanosis, +1 edema MS- no significant deformity or atrophy Skin- ecchymosis over L arm today s/p recent fall Psych- euthymic mood, full affect Neuro- strength and sensation are intact  Pacemaker interrogation- reviewed in detail today,  See PACEART report  Assessment and Plan:  1. Neurocardiogenic syncope/ sinus node dysfunction Normal pacemaker function See [name] report She is not orthostatic today  2. Tobacco Cessation advised  3. HTN Elevated Son states taht BP is never low Will wean midodrine (not helping) to 2.5mg  BID x 4 weeks then stop this medicine  4. Frequent falls I worry that there could be a neurologic cause for her frequent falls.  I am not convinced that this is due to a cardiac issue and her PPM is functioning normally. I will refer to Dr Leta Baptist for further evaluation from a  neuro standpoint.  Remote monitoring Return to see EP NP in 1 year  Thompson Grayer MD, [facility] 04/17/2016 4:47 PM

## 2016-04-17 NOTE — Patient Instructions (Addendum)
Medication Instructions:  Your physician has recommended you make the following change in your medication:  1) Decrease Midodrine to 2.5 mg --1/2 tablet twice daily for 4 weeks then stop    Labwork: None ordered   Testing/Procedures: None ordered   Follow-Up: You have been referred to Mary Higgins and gait imbalance   Your physician wants you to follow-up in: 108 months Mary Marshall, NP You will receive a reminder letter in the mail two months in advance. If you don't receive a letter, please call our office to schedule the follow-up appointment.   Remote monitoring is used to monitor your Pacemaker  from home. This monitoring reduces the number of office visits required to check your device to one time per year. It allows [place] to keep an eye on the functioning of your device to ensure it is working properly. You are scheduled for a device check from home on 07/18/16. You may send your transmission at any time that day. If you have a wireless device, the transmission will be sent automatically. After your physician reviews your transmission, you will receive a postcard with your next transmission date.    Any Other Special Instructions Will Be Listed Below (If Applicable).     If you need a refill on your cardiac medications before your next appointment, please call your pharmacy.

## 2016-04-18 ENCOUNTER — Other Ambulatory Visit: Payer: Self-pay | Admitting: Internal Medicine

## 2016-04-26 ENCOUNTER — Other Ambulatory Visit: Payer: Self-pay | Admitting: Internal Medicine

## 2016-05-10 ENCOUNTER — Other Ambulatory Visit: Payer: Self-pay | Admitting: Internal Medicine

## 2016-05-24 ENCOUNTER — Other Ambulatory Visit: Payer: Self-pay | Admitting: Internal Medicine

## 2016-06-11 ENCOUNTER — Other Ambulatory Visit: Payer: Self-pay | Admitting: Internal Medicine

## 2016-06-19 ENCOUNTER — Other Ambulatory Visit: Payer: Self-pay | Admitting: Internal Medicine

## 2016-06-21 NOTE — Telephone Encounter (Signed)
Per 04/17/16 office visit: Patient Instructions   Medication Instructions:  Your physician has recommended you make the following change in your medication:  1) Decrease Midodrine to 2.5 mg --1/2 tablet twice daily for 4 weeks then stop    It was refilled on 05/24/16, but I do not see where she was told to resume taking it.  Please advise. Thanks, MI

## 2016-06-23 ENCOUNTER — Other Ambulatory Visit: Payer: Self-pay | Admitting: Internal Medicine

## 2016-06-23 ENCOUNTER — Other Ambulatory Visit: Payer: Self-pay | Admitting: *Deleted

## 2016-06-27 NOTE — Telephone Encounter (Signed)
Per Allred's last note.  Call patient and see if she has stopped.  She was to wean.  May have needed one refill to get her through the month weaning   3. HTN Elevated Son states taht BP is never low Will wean midodrine (not helping) to 2.5mg  BID x 4 weeks then stop this medicine

## 2016-06-27 NOTE — Telephone Encounter (Signed)
Spoke with patient and she continued to take the medication as she thought that she was just supposed to reduce her dose to 2.5 mg bid, she was not aware that she was to stop taking it. She has not been checking her bp. Please advise. Thanks, MI

## 2016-06-28 ENCOUNTER — Other Ambulatory Visit: Payer: Self-pay

## 2016-06-28 MED ORDER — MIDODRINE HCL 5 MG PO TABS: 2.5000 mg | ORAL_TABLET | Freq: Two times a day (BID) | ORAL | 6 refills | Status: DC

## 2016-07-11 ENCOUNTER — Other Ambulatory Visit: Payer: Self-pay | Admitting: Internal Medicine

## 2016-07-18 ENCOUNTER — Ambulatory Visit (INDEPENDENT_AMBULATORY_CARE_PROVIDER_SITE_OTHER): Payer: Medicare Other | Admitting: *Deleted

## 2016-07-18 DIAGNOSIS — I495 Sick sinus syndrome: Secondary | ICD-10-CM

## 2016-07-19 NOTE — Progress Notes (Signed)
Remote pacemaker transmission.   

## 2016-07-20 ENCOUNTER — Other Ambulatory Visit: Payer: Self-pay | Admitting: Internal Medicine

## 2016-07-21 ENCOUNTER — Other Ambulatory Visit: Payer: Self-pay | Admitting: Internal Medicine

## 2016-07-21 ENCOUNTER — Encounter: Payer: Self-pay | Admitting: Cardiology

## 2016-07-24 ENCOUNTER — Other Ambulatory Visit: Payer: Self-pay | Admitting: *Deleted

## 2016-07-24 MED ORDER — METFORMIN HCL 500 MG PO TABS: ORAL_TABLET | ORAL | 2 refills | Status: DC

## 2016-07-24 NOTE — Telephone Encounter (Signed)
[facility].  

## 2016-07-26 LAB — CUP PACEART REMOTE DEVICE CHECK
Brady Statistic RA Percent Paced: 77 %
Brady Statistic RV Percent Paced: 1 %
Date Time Interrogation Session: 20170913153223
Implantable Lead Implant Date: 20070830
Implantable Lead Implant Date: 20070830
Implantable Lead Location: 753859
Implantable Lead Location: 753860
Lead Channel Impedance Value: 449 Ohm
Lead Channel Impedance Value: 663 Ohm
Lead Channel Pacing Threshold Amplitude: 0.5 V
Lead Channel Pacing Threshold Amplitude: 2.2 V
Lead Channel Pacing Threshold Pulse Width: 0.4 ms
Lead Channel Pacing Threshold Pulse Width: 0.4 ms
Lead Channel Sensing Intrinsic Amplitude: 11.1 mV
Lead Channel Sensing Intrinsic Amplitude: 2.5 mV
Lead Channel Setting Pacing Amplitude: 1.5 V
Lead Channel Setting Pacing Amplitude: 3.6 V
Lead Channel Setting Pacing Pulse Width: 0.4 ms
Pulse Gen Model: 394931
Pulse Gen Serial Number: 68408118

## 2016-08-14 ENCOUNTER — Other Ambulatory Visit: Payer: Self-pay | Admitting: *Deleted

## 2016-08-14 ENCOUNTER — Other Ambulatory Visit: Payer: Self-pay | Admitting: Internal Medicine

## 2016-08-15 ENCOUNTER — Other Ambulatory Visit: Payer: Self-pay | Admitting: Nurse Practitioner

## 2016-08-15 ENCOUNTER — Other Ambulatory Visit: Payer: Self-pay | Admitting: Internal Medicine

## 2016-08-18 ENCOUNTER — Other Ambulatory Visit: Payer: Self-pay | Admitting: *Deleted

## 2016-08-22 ENCOUNTER — Telehealth: Payer: Self-pay

## 2016-08-22 DIAGNOSIS — H04123 Dry eye syndrome of bilateral lacrimal glands: Secondary | ICD-10-CM | POA: Diagnosis not present

## 2016-08-22 DIAGNOSIS — H40033 Anatomical narrow angle, bilateral: Secondary | ICD-10-CM | POA: Diagnosis not present

## 2016-08-22 MED ORDER — PAROXETINE HCL 10 MG PO TABS: 10.0000 mg | ORAL_TABLET | Freq: Every day | ORAL | 4 refills | Status: DC

## 2016-08-22 NOTE — Telephone Encounter (Signed)
A refill request was received from [facility] for Paroxetine 10 mg tablets.   This medication is not currently on patient's medication list but I see that it was there at patient's last visit in May. Is it ok to refill this medication or has this medication been discontinued?   Please advise.

## 2016-08-22 NOTE — Telephone Encounter (Signed)
Prescription for paxil 10 mg tablets. #90 with 4 RF was sent to pharmacy electronically.

## 2016-08-22 NOTE — Telephone Encounter (Signed)
Resume Paxil 10 mg daily. Disp: 90 tabs. Refill x 4.

## 2016-08-31 ENCOUNTER — Emergency Department (HOSPITAL_COMMUNITY)
Admission: EM | Admit: 2016-08-31 | Discharge: 2016-08-31 | Disposition: A | Discharge status: Home / Self Care | Payer: Medicare Other | Attending: Dermatology | Admitting: Dermatology

## 2016-08-31 ENCOUNTER — Encounter (HOSPITAL_COMMUNITY): Payer: Self-pay | Admitting: Emergency Medicine

## 2016-08-31 DIAGNOSIS — Z7982 Long term (current) use of aspirin: Secondary | ICD-10-CM | POA: Diagnosis not present

## 2016-08-31 DIAGNOSIS — Y929 Unspecified place or not applicable: Secondary | ICD-10-CM | POA: Insufficient documentation

## 2016-08-31 DIAGNOSIS — Y999 Unspecified external cause status: Secondary | ICD-10-CM | POA: Insufficient documentation

## 2016-08-31 DIAGNOSIS — Z853 Personal history of malignant neoplasm of breast: Secondary | ICD-10-CM | POA: Diagnosis not present

## 2016-08-31 DIAGNOSIS — F17210 Nicotine dependence, cigarettes, uncomplicated: Secondary | ICD-10-CM | POA: Diagnosis not present

## 2016-08-31 DIAGNOSIS — I10 Essential (primary) hypertension: Secondary | ICD-10-CM | POA: Insufficient documentation

## 2016-08-31 DIAGNOSIS — S51852A Open bite of left forearm, initial encounter: Secondary | ICD-10-CM | POA: Diagnosis not present

## 2016-08-31 DIAGNOSIS — Z950 Presence of cardiac pacemaker: Secondary | ICD-10-CM | POA: Diagnosis not present

## 2016-08-31 DIAGNOSIS — W540XXA Bitten by dog, initial encounter: Secondary | ICD-10-CM | POA: Diagnosis not present

## 2016-08-31 DIAGNOSIS — E119 Type 2 diabetes mellitus without complications: Secondary | ICD-10-CM | POA: Insufficient documentation

## 2016-08-31 DIAGNOSIS — Y939 Activity, unspecified: Secondary | ICD-10-CM | POA: Diagnosis not present

## 2016-08-31 DIAGNOSIS — Z7984 Long term (current) use of oral hypoglycemic drugs: Secondary | ICD-10-CM | POA: Insufficient documentation

## 2016-08-31 DIAGNOSIS — Z5321 Procedure and treatment not carried out due to patient leaving prior to being seen by health care provider: Secondary | ICD-10-CM | POA: Insufficient documentation

## 2016-08-31 DIAGNOSIS — J449 Chronic obstructive pulmonary disease, unspecified: Secondary | ICD-10-CM | POA: Diagnosis not present

## 2016-08-31 NOTE — ED Notes (Signed)
Pt stated she is not waiting any longer, her son is pulling the car around to get her at this time.

## 2016-08-31 NOTE — ED Triage Notes (Addendum)
Pt was bit by dog on L forearm today. Dog is known to family and has had all its vaccinations. Pt was also bit by the dog on the R forearm a few days ago. R forearm swollen. Last tetanus over 10 years ago. Full ROM

## 2016-09-15 ENCOUNTER — Other Ambulatory Visit: Payer: Self-pay | Admitting: Internal Medicine

## 2016-09-19 ENCOUNTER — Ambulatory Visit: Payer: Medicare Other | Admitting: Internal Medicine

## 2016-09-20 ENCOUNTER — Ambulatory Visit: Payer: Medicare Other | Admitting: Internal Medicine

## 2016-10-17 ENCOUNTER — Other Ambulatory Visit: Payer: Self-pay | Admitting: Internal Medicine

## 2016-10-17 ENCOUNTER — Ambulatory Visit (INDEPENDENT_AMBULATORY_CARE_PROVIDER_SITE_OTHER): Payer: Medicare Other | Admitting: *Deleted

## 2016-10-17 DIAGNOSIS — I495 Sick sinus syndrome: Secondary | ICD-10-CM | POA: Diagnosis not present

## 2016-10-17 NOTE — Progress Notes (Signed)
Remote pacemaker transmission.   

## 2016-10-25 ENCOUNTER — Encounter: Payer: Self-pay | Admitting: Cardiology

## 2016-11-10 LAB — CUP PACEART REMOTE DEVICE CHECK
Brady Statistic RA Percent Paced: 73 %
Date Time Interrogation Session: 20171229163157
Implantable Lead Implant Date: 20070830
Implantable Lead Implant Date: 20070830
Implantable Lead Location: 753859
Implantable Lead Location: 753860
Implantable Pulse Generator Implant Date: 20151113
Lead Channel Impedance Value: 390 Ohm
Lead Channel Impedance Value: 741 Ohm
Lead Channel Pacing Threshold Amplitude: 0.5 V
Lead Channel Pacing Threshold Amplitude: 2.1 V
Lead Channel Pacing Threshold Pulse Width: 0.4 ms
Lead Channel Pacing Threshold Pulse Width: 0.4 ms
Lead Channel Sensing Intrinsic Amplitude: 13.1 mV
Lead Channel Sensing Intrinsic Amplitude: 3.2 mV
Pulse Gen Model: 394931
Pulse Gen Serial Number: 68408118

## 2016-11-15 ENCOUNTER — Other Ambulatory Visit: Payer: Self-pay | Admitting: Nurse Practitioner

## 2016-11-21 ENCOUNTER — Ambulatory Visit: Payer: Medicare Other | Admitting: Internal Medicine

## 2016-12-11 ENCOUNTER — Other Ambulatory Visit: Payer: Self-pay | Admitting: *Deleted

## 2016-12-11 MED ORDER — CLONAZEPAM 1 MG PO TABS: ORAL_TABLET | ORAL | 0 refills | Status: DC

## 2016-12-11 NOTE — Telephone Encounter (Signed)
Patient requested to be faxed to pharmacy 

## 2016-12-13 ENCOUNTER — Ambulatory Visit (INDEPENDENT_AMBULATORY_CARE_PROVIDER_SITE_OTHER): Payer: Medicare Other | Admitting: Internal Medicine

## 2016-12-13 ENCOUNTER — Encounter: Payer: Self-pay | Admitting: Internal Medicine

## 2016-12-13 VITALS — BP 174/98 | HR 80 | Temp 97.5°F | Ht 69.0 in | Wt 174.0 lb

## 2016-12-13 DIAGNOSIS — S41159A Open bite of unspecified upper arm, initial encounter: Secondary | ICD-10-CM | POA: Insufficient documentation

## 2016-12-13 DIAGNOSIS — E119 Type 2 diabetes mellitus without complications: Secondary | ICD-10-CM | POA: Diagnosis not present

## 2016-12-13 DIAGNOSIS — S41152S Open bite of left upper arm, sequela: Secondary | ICD-10-CM | POA: Diagnosis not present

## 2016-12-13 DIAGNOSIS — I10 Essential (primary) hypertension: Secondary | ICD-10-CM

## 2016-12-13 DIAGNOSIS — G4700 Insomnia, unspecified: Secondary | ICD-10-CM

## 2016-12-13 DIAGNOSIS — W540XXA Bitten by dog, initial encounter: Secondary | ICD-10-CM | POA: Insufficient documentation

## 2016-12-13 DIAGNOSIS — E785 Hyperlipidemia, unspecified: Secondary | ICD-10-CM

## 2016-12-13 DIAGNOSIS — W540XXS Bitten by dog, sequela: Secondary | ICD-10-CM | POA: Diagnosis not present

## 2016-12-13 DIAGNOSIS — L570 Actinic keratosis: Secondary | ICD-10-CM | POA: Diagnosis not present

## 2016-12-13 DIAGNOSIS — R609 Edema, unspecified: Secondary | ICD-10-CM | POA: Diagnosis not present

## 2016-12-13 DIAGNOSIS — W19XXXD Unspecified fall, subsequent encounter: Secondary | ICD-10-CM | POA: Diagnosis not present

## 2016-12-13 DIAGNOSIS — Z23 Encounter for immunization: Secondary | ICD-10-CM

## 2016-12-13 DIAGNOSIS — Y92009 Unspecified place in unspecified non-institutional (private) residence as the place of occurrence of the external cause: Secondary | ICD-10-CM | POA: Insufficient documentation

## 2016-12-13 DIAGNOSIS — R197 Diarrhea, unspecified: Secondary | ICD-10-CM

## 2016-12-13 DIAGNOSIS — W19XXXA Unspecified fall, initial encounter: Secondary | ICD-10-CM | POA: Insufficient documentation

## 2016-12-13 MED ORDER — ALIGN PO CAPS: ORAL_CAPSULE | ORAL | 5 refills | Status: DC

## 2016-12-13 MED ORDER — LOSARTAN POTASSIUM-HCTZ 100-25 MG PO TABS: ORAL_TABLET | ORAL | 3 refills | Status: DC

## 2016-12-13 MED ORDER — MELATONIN 5 MG PO TABS: ORAL_TABLET | ORAL | 5 refills | Status: DC

## 2016-12-13 MED ORDER — LOSARTAN POTASSIUM-HCTZ 50-12.5 MG PO TABS: 1.0000 | ORAL_TABLET | Freq: Every day | ORAL | 3 refills | Status: DC

## 2016-12-13 MED ORDER — HYDROCODONE-ACETAMINOPHEN 5-325 MG PO TABS: ORAL_TABLET | ORAL | 0 refills | Status: DC

## 2016-12-13 MED ORDER — LOSARTAN POTASSIUM-HCTZ 50-12.5 MG PO TABS: ORAL_TABLET | ORAL | 3 refills | Status: DC

## 2016-12-13 NOTE — Progress Notes (Signed)
Facility  [facility]    Place of Service:   OFFICE    Allergies  Allergen Reactions  . Oxaprozin Rash    Chief Complaint  Patient presents with  . Medical Management of Chronic Issues    6 month medication management blood sugar, blood pressure, cholesterol. Here with son [name]  . Follow-up    08/31/16 ED bitten by dog, left lower arm    HPI:  Type 2 diabetes mellitus without complication, without long-term current use of insulin (HCC) - repeat lab Hemoglobin A1c, Comprehensive metabolic panel  Hyperlipidemia, unspecified hyperlipidemia type - recheck lab in future  Essential hypertension - ran out of losartan. Start losartan-hydrochlorothiazide (HYZAAR) 50-12.5 MG tablet  Diarrhea, unspecified type - has never used probiotic. Try bifidobacterium infantis (ALIGN) capsule. Colonoscopy in 1996 by dr. Deatra Ina.  Dog bite of left upper extremity, sequela - extensive healed scars of the left forearm. No loss of function.  Actinic keratosis - she ponts out lesions on the left face anterior to the tragus and on the chest.  Edema, unspecified type - worse, 2+ bipedal  Fall, subsequent encounter - recurrent falls. Poor balance.  Insomnia, unspecified type - already on remoeron and temazepam at bed  Encounter for immunization - Plan: Flu Vaccine QUAD 36+ mos IM  Medications: Patient's Medications  New Prescriptions   No medications on file  Previous Medications   ASPIRIN 81 MG TABLET    Take 81 mg by mouth daily.   ATORVASTATIN (LIPITOR) 40 MG TABLET    TAKE 1 TABLET BY MOUTH EVERY DAY   CLONAZEPAM (KLONOPIN) 1 MG TABLET    Take one tablet by mouth twice daily to help control eye spasms   CLOTRIMAZOLE (LOTRIMIN) 1 % EXTERNAL SOLUTION    Apply daily to affected nails   HYDROCODONE-ACETAMINOPHEN (NORCO/VICODIN) 5-325 MG TABLET    Take 1 tablet every 6 hours as needed for pain   LOSARTAN (COZAAR) 100 MG TABLET    TAKE 1 TABLET BY MOUTH EVERY DAY   METFORMIN (GLUCOPHAGE) 500 MG  TABLET    TAKE 1 TABLET BY MOUTH WITH BREAKFAST AND WITH SUPPER FOR DIABETES   METOPROLOL (LOPRESSOR) 50 MG TABLET    TAKE 1 TABLET BY MOUTH TWICE DAILY   MIDODRINE (PROAMATINE) 5 MG TABLET    Take 0.5 tablets (2.5 mg total) by mouth 2 (two) times daily with a meal.   MIRTAZAPINE (REMERON) 15 MG TABLET    TAKE 1 TABLET BY MOUTH EVERY NIGHT AT BEDTIME   MULTIPLE VITAMIN (MULTIVITAMIN) CAPSULE    Take 1 capsule by mouth daily.   PANTOPRAZOLE (PROTONIX) 40 MG TABLET    TAKE 1 TABLET BY MOUTH EVERY DAY TO REDUCE STOMACH ACID   PAROXETINE (PAXIL) 10 MG TABLET    Take 1 tablet (10 mg total) by mouth daily.   TEMAZEPAM (RESTORIL) 15 MG CAPSULE    Take 1 capsule (15 mg total) by mouth at bedtime as needed for sleep.  Modified Medications   No medications on file  Discontinued Medications   No medications on file    Review of Systems  Constitutional: Positive for appetite change (Decreased) and fatigue.  HENT: Positive for hearing loss.        Dentures  Eyes: Positive for visual disturbance (Corrective lenses).       Xerophthalmia  Respiratory: Positive for shortness of breath. Negative for cough.        History of chronic tobacco abuse; 1/2 PPD. Tender righr chest due to fall  and trauma.  Cardiovascular: Positive for palpitations and leg swelling. Negative for chest pain.  Gastrointestinal: Negative for abdominal distention, abdominal pain and anal bleeding.       Hemorrhoids  Endocrine: Negative.        History of diabetes  Genitourinary: Positive for difficulty urinating and frequency.       Incontinence.  Musculoskeletal: Positive for gait problem.       History of falls and gait instability. Chronic right flank discomfort.  Skin: Negative.   Allergic/Immunologic: Negative.   Neurological: Positive for dizziness.       History of neurocardiogenic syncope. Memory disorder. History of intracerebral bleed in 2003. Subsequently had a small aneurysm treated by Dr. Estanislado Pandy with a  coil. History of shunt to the brain in the left parietal area placed by Dr. Vertell Limber, neurosurgeon in 2003.  Hematological: Negative.   Psychiatric/Behavioral: Positive for confusion, decreased concentration, dysphoric mood and sleep disturbance.    Vitals:   12/13/16 1048  BP: (!) 174/98  Pulse: 80  Temp: 97.5 F (36.4 C)  TempSrc: Oral  Weight: 174 lb (78.9 kg)  Height: 5' 9" (1.753 m)   Body mass index is 25.7 kg/m. Wt Readings from Last 3 Encounters:  12/13/16 174 lb (78.9 kg)  04/17/16 173 lb 9.6 oz (78.7 kg)  03/14/16 169 lb (76.7 kg)      Physical Exam  Constitutional: She is oriented to person, place, and time. She appears well-developed and well-nourished. No distress.  HENT:  Right Ear: External ear normal.  Left Ear: External ear normal.  Nose: Nose normal.  Mouth/Throat: Oropharynx is clear and moist. No oropharyngeal exudate.  Ventriculoperitoneal shunt. Upper dentures  Eyes: Conjunctivae and EOM are normal. Pupils are equal, round, and reactive to light. No scleral icterus.  Bilateral  Neck: No JVD present. No tracheal deviation present. No thyromegaly present.  Cardiovascular: Normal rate, regular rhythm, normal heart sounds and intact distal pulses.  Exam reveals no gallop and no friction rub.   No murmur heard. Pulmonary/Chest: Effort normal. No respiratory distress. She has no wheezes. She has no rales. She exhibits no tenderness.  Pacemaker right upper chest Atrophic breasts. Breasts are previous breast cancer surgery Tender right chest wall.  Abdominal: She exhibits no distension and no mass. There is no tenderness.  Genitourinary:  Genitourinary Comments: History of hemorrhoids  Musculoskeletal: Normal range of motion. She exhibits edema (Approximately 1+ bilaterally in lower legs and ankles). She exhibits no tenderness.  Lymphadenopathy:    She has no cervical adenopathy.  Neurological: She is alert and oriented to person, place, and time. No  cranial nerve deficit. Coordination normal.  03/14/16 MMSE 28/30.Passed clock drawing.  Skin: No rash noted. She is not diaphoretic. No erythema. No pallor.  Extensive scars left forearm from dog bite. Actinic keratosis of the chest and of the face anterior to the right tragus.  Psychiatric: She has a normal mood and affect. Her behavior is normal. Judgment and thought content normal.    Labs reviewed: Lab Summary Latest Ref Rng & Units 03/14/2016  Hemoglobin 13.0-17.0 g/dL (None)  Hematocrit 39.0-52.0 % (None)  White count - (None)  Platelet count - (None)  Sodium 134 - 144 mmol/L 138  Potassium 3.5 - 5.2 mmol/L 4.3  Calcium 8.7 - 10.3 mg/dL 8.8  Phosphorus - (None)  Creatinine 0.57 - 1.00 mg/dL 0.58  AST 0 - 40 IU/L 14  Alk Phos 39 - 117 IU/L 88  Bilirubin 0.0 - 1.2 mg/dL 0.5  Glucose  65 - 99 mg/dL 79  Cholesterol - (None)  HDL cholesterol >39 mg/dL 46  Triglycerides 0 - 149 mg/dL 155(H)  LDL Direct - (None)  LDL Calc 0 - 99 mg/dL 58  Total protein - (None)  Albumin 3.5 - 4.8 g/dL 4.3  Some recent data might be hidden   Lab Results  Component Value Date   TSH 3.860 08/17/2015   TSH 1.680 01/20/2015   TSH 1.307 03/31/2013   Lab Results  Component Value Date   BUN 7 (L) 03/14/2016   BUN 8 08/17/2015   BUN 12 01/20/2015   Lab Results  Component Value Date   HGBA1C CANCELED 03/14/2016   HGBA1C 6.1 (H) 03/14/2016   HGBA1C 6.3 (H) 08/17/2015    Assessment/Plan  1. Type 2 diabetes mellitus without complication, without long-term current use of insulin (HCC) - Hemoglobin A1c; Future - Comprehensive metabolic panel; Future  2. Hyperlipidemia, unspecified hyperlipidemia type Lipid panel, future  3. Essential hypertension - losartan-hydrochlorothiazide (HYZAAR) 50-12.5 MG tablet; Take 1 tablet by mouth daily.  Dispense: 90 tablet; Refill: 3 - Comprehensive metabolic panel; Future  4. Diarrhea, unspecified type - bifidobacterium infantis (ALIGN) capsule; One daily  for probiotic to help diarrhea  Dispense: 28 capsule; Refill: 5  5. Dog bite of left upper extremity, sequela Healed  6. Actinic keratosis - Ambulatory referral to Dermatology  7. Edema, unspecified type Add HCTZ  8. Fall, subsequent encounter - HYDROcodone-acetaminophen (NORCO/VICODIN) 5-325 MG tablet; Take 1 tablet every 6 hours as needed for pain  Dispense: 50 tablet; Refill: 0  9. Insomnia, unspecified type - Melatonin 5 MG TABS; 1 nightly as needed for sleep  Dispense: 30 tablet; Refill: 5  10. Encounter for immunization - Flu Vaccine QUAD 36+ mos IM

## 2016-12-19 ENCOUNTER — Telehealth: Payer: Self-pay | Admitting: *Deleted

## 2016-12-19 NOTE — Telephone Encounter (Signed)
Patient called and stated that she has 2 different bottles for Losartan. One for Losartan and one for Losartan HCTZ and was wondering which one she should be taking. I reviewed Dr. Rolly Salter last OV note and it states the Losartan HCTZ. Informed patient and she agrees due to her swelling.

## 2017-01-08 ENCOUNTER — Other Ambulatory Visit: Payer: Medicare Other

## 2017-01-09 ENCOUNTER — Other Ambulatory Visit: Payer: Self-pay | Admitting: Internal Medicine

## 2017-01-10 ENCOUNTER — Ambulatory Visit: Payer: Medicare Other | Admitting: Internal Medicine

## 2017-01-11 ENCOUNTER — Other Ambulatory Visit: Payer: Self-pay | Admitting: Internal Medicine

## 2017-01-12 ENCOUNTER — Other Ambulatory Visit: Payer: Self-pay | Admitting: Internal Medicine

## 2017-01-13 ENCOUNTER — Other Ambulatory Visit: Payer: Self-pay | Admitting: Internal Medicine

## 2017-01-16 ENCOUNTER — Ambulatory Visit (INDEPENDENT_AMBULATORY_CARE_PROVIDER_SITE_OTHER): Payer: Medicare Other | Admitting: *Deleted

## 2017-01-16 DIAGNOSIS — I495 Sick sinus syndrome: Secondary | ICD-10-CM | POA: Diagnosis not present

## 2017-01-16 LAB — CUP PACEART REMOTE DEVICE CHECK
Brady Statistic RA Percent Paced: 71 %
Brady Statistic RV Percent Paced: 1 %
Date Time Interrogation Session: 20180306162828
Implantable Lead Implant Date: 20070830
Implantable Lead Implant Date: 20070830
Implantable Lead Location: 753859
Implantable Lead Location: 753860
Implantable Pulse Generator Implant Date: 20151113
Lead Channel Impedance Value: 449 Ohm
Lead Channel Impedance Value: 702 Ohm
Lead Channel Pacing Threshold Amplitude: 0.5 V
Lead Channel Pacing Threshold Amplitude: 2 V
Lead Channel Pacing Threshold Pulse Width: 0.4 ms
Lead Channel Pacing Threshold Pulse Width: 0.4 ms
Lead Channel Sensing Intrinsic Amplitude: 11.6 mV
Lead Channel Sensing Intrinsic Amplitude: 3 mV
Lead Channel Setting Pacing Amplitude: 1.5 V
Lead Channel Setting Pacing Amplitude: 3.6 V
Lead Channel Setting Pacing Pulse Width: 0.4 ms
Pulse Gen Model: 394931
Pulse Gen Serial Number: 68408118

## 2017-01-16 NOTE — Progress Notes (Signed)
Remote pacemaker transmission.   

## 2017-01-17 ENCOUNTER — Encounter: Payer: Self-pay | Admitting: Cardiology

## 2017-02-05 ENCOUNTER — Other Ambulatory Visit: Payer: Medicare Other

## 2017-02-05 DIAGNOSIS — Z23 Encounter for immunization: Secondary | ICD-10-CM | POA: Diagnosis not present

## 2017-02-05 DIAGNOSIS — E119 Type 2 diabetes mellitus without complications: Secondary | ICD-10-CM | POA: Diagnosis not present

## 2017-02-05 DIAGNOSIS — I10 Essential (primary) hypertension: Secondary | ICD-10-CM

## 2017-02-05 LAB — COMPREHENSIVE METABOLIC PANEL
ALT: 10 U/L (ref 6–29)
AST: 12 U/L (ref 10–35)
Albumin: 4 g/dL (ref 3.6–5.1)
Alkaline Phosphatase: 53 U/L (ref 33–130)
BUN: 13 mg/dL (ref 7–25)
CO2: 27 mmol/L (ref 20–31)
Calcium: 9 mg/dL (ref 8.6–10.4)
Chloride: 87 mmol/L — ABNORMAL LOW (ref 98–110)
Creat: 0.7 mg/dL (ref 0.60–0.93)
Glucose, Bld: 105 mg/dL — ABNORMAL HIGH (ref 65–99)
Potassium: 4.1 mmol/L (ref 3.5–5.3)
Sodium: 125 mmol/L — ABNORMAL LOW (ref 135–146)
Total Bilirubin: 1 mg/dL (ref 0.2–1.2)
Total Protein: 6.1 g/dL (ref 6.1–8.1)

## 2017-02-06 LAB — HEMOGLOBIN A1C
Hgb A1c MFr Bld: 5.9 % — ABNORMAL HIGH (ref <5.7)
Mean Plasma Glucose: 123 mg/dL

## 2017-02-07 ENCOUNTER — Encounter: Payer: Self-pay | Admitting: Internal Medicine

## 2017-02-07 ENCOUNTER — Ambulatory Visit (INDEPENDENT_AMBULATORY_CARE_PROVIDER_SITE_OTHER): Payer: Medicare Other | Admitting: Internal Medicine

## 2017-02-07 VITALS — BP 110/62 | HR 64 | Temp 97.3°F | Ht 69.0 in | Wt 169.0 lb

## 2017-02-07 DIAGNOSIS — E119 Type 2 diabetes mellitus without complications: Secondary | ICD-10-CM

## 2017-02-07 DIAGNOSIS — F32A Depression, unspecified: Secondary | ICD-10-CM

## 2017-02-07 DIAGNOSIS — L570 Actinic keratosis: Secondary | ICD-10-CM

## 2017-02-07 DIAGNOSIS — F324 Major depressive disorder, single episode, in partial remission: Secondary | ICD-10-CM | POA: Diagnosis not present

## 2017-02-07 DIAGNOSIS — E871 Hypo-osmolality and hyponatremia: Secondary | ICD-10-CM

## 2017-02-07 DIAGNOSIS — G245 Blepharospasm: Secondary | ICD-10-CM | POA: Diagnosis not present

## 2017-02-07 DIAGNOSIS — Z9181 History of falling: Secondary | ICD-10-CM

## 2017-02-07 DIAGNOSIS — G4700 Insomnia, unspecified: Secondary | ICD-10-CM

## 2017-02-07 DIAGNOSIS — K219 Gastro-esophageal reflux disease without esophagitis: Secondary | ICD-10-CM

## 2017-02-07 DIAGNOSIS — R197 Diarrhea, unspecified: Secondary | ICD-10-CM | POA: Diagnosis not present

## 2017-02-07 DIAGNOSIS — I10 Essential (primary) hypertension: Secondary | ICD-10-CM

## 2017-02-07 DIAGNOSIS — W19XXXD Unspecified fall, subsequent encounter: Secondary | ICD-10-CM | POA: Diagnosis not present

## 2017-02-07 DIAGNOSIS — G8929 Other chronic pain: Secondary | ICD-10-CM

## 2017-02-07 DIAGNOSIS — E785 Hyperlipidemia, unspecified: Secondary | ICD-10-CM | POA: Diagnosis not present

## 2017-02-07 DIAGNOSIS — F329 Major depressive disorder, single episode, unspecified: Secondary | ICD-10-CM

## 2017-02-07 DIAGNOSIS — S0990XA Unspecified injury of head, initial encounter: Secondary | ICD-10-CM

## 2017-02-07 MED ORDER — PANTOPRAZOLE SODIUM 40 MG PO TBEC: DELAYED_RELEASE_TABLET | ORAL | 0 refills | Status: DC

## 2017-02-07 MED ORDER — BUPROPION HCL 100 MG PO TABS: ORAL_TABLET | ORAL | 4 refills | Status: DC

## 2017-02-07 MED ORDER — CLONAZEPAM 1 MG PO TABS: ORAL_TABLET | ORAL | 4 refills | Status: DC

## 2017-02-07 MED ORDER — HYDROCODONE-ACETAMINOPHEN 5-325 MG PO TABS: ORAL_TABLET | ORAL | 0 refills | Status: DC

## 2017-02-07 NOTE — Patient Instructions (Addendum)
Align is available OTC.  Stop Paxil and mirtazipine. Start bupropion.  Metamucil may help diarrhea.

## 2017-02-07 NOTE — Progress Notes (Signed)
Facility  [facility]    Place of Service:   OFFICE    Allergies  Allergen Reactions  . Oxaprozin Rash    Chief Complaint  Patient presents with  . Medical Management of Chronic Issues    medication management blood sugar, blood pressure, edema, review labs. Here with son [name]  . Fall    fell today leaving the house to come here, triped fell on both knees. "I fall a lot for no reason"     HPI:  History of fall - no serious injury from today's fall. Using cane regularly. High risk for more falls.  Type 2 diabetes mellitus without complication, without long-term current use of insulin (HCC) - controlled  Hyperlipidemia, unspecified hyperlipidemia type - controlled  Essential hypertension - controlled  Diarrhea, unspecified type - better on Align  Actinic keratosis - improved  Insomnia, unspecified type - better on Melatonin  Blepharospasm  Depression due to head injury  Major depressive disorder with single episode, in partial remission ([place]) - does not feel that current meds are helping her. No motivation to do anything per her son.  Gastroesophageal reflux disease without esophagitis - asymptomatic on pantoprazole (PROTONIX) 40 MG tablet  Other chronic pain - benefits from HYDROcodone-acetaminophen (NORCO/VICODIN) 5-325 MG tablet  Hyponatremia - new on recent lab. No indication of excessive free water intake  Medications: Patient's Medications  New Prescriptions   No medications on file  Previous Medications   ASPIRIN 81 MG TABLET    Take 81 mg by mouth daily.   ATORVASTATIN (LIPITOR) 40 MG TABLET    TAKE 1 TABLET BY MOUTH EVERY DAY   BIFIDOBACTERIUM INFANTIS (ALIGN) CAPSULE    One daily for probiotic to help diarrhea   CLONAZEPAM (KLONOPIN) 1 MG TABLET    TAKE 1 TABLET BY MOUTH TWICE DAILY TO HELP CONTROL EYE SPASMS   HYDROCODONE-ACETAMINOPHEN (NORCO/VICODIN) 5-325 MG TABLET    Take 1 tablet every 6 hours as needed for pain   LOSARTAN-HYDROCHLOROTHIAZIDE  (HYZAAR) 100-25 MG TABLET    One daily to control BP   MELATONIN 5 MG TABS    1 nightly as needed for sleep   METFORMIN (GLUCOPHAGE) 500 MG TABLET    TAKE 1 TABLET BY MOUTH WITH BREAKFAST AND WITH SUPPER FOR DIABETES   METOPROLOL (LOPRESSOR) 50 MG TABLET    TAKE 1 TABLET BY MOUTH TWICE DAILY   MIDODRINE (PROAMATINE) 5 MG TABLET    Take 0.5 tablets (2.5 mg total) by mouth 2 (two) times daily with a meal.   MIRTAZAPINE (REMERON) 15 MG TABLET    TAKE 1 TABLET BY MOUTH EVERY NIGHT AT BEDTIME   MULTIPLE VITAMIN (MULTIVITAMIN) CAPSULE    Take 1 capsule by mouth daily.   PANTOPRAZOLE (PROTONIX) 40 MG TABLET    TAKE 1 TABLET BY MOUTH EVERY DAY TO REDUCE STOMACH ACID   PAROXETINE (PAXIL) 10 MG TABLET    Take 1 tablet (10 mg total) by mouth daily.  Modified Medications   No medications on file  Discontinued Medications   CLOTRIMAZOLE (LOTRIMIN) 1 % EXTERNAL SOLUTION    Apply daily to affected nails   TEMAZEPAM (RESTORIL) 15 MG CAPSULE    Take 1 capsule (15 mg total) by mouth at bedtime as needed for sleep.    Review of Systems  Constitutional: Positive for appetite change (Decreased) and fatigue.  HENT: Positive for hearing loss.        Dentures  Eyes: Positive for visual disturbance (Corrective lenses).  Xerophthalmia  Respiratory: Positive for shortness of breath. Negative for cough.        History of chronic tobacco abuse; 1/2 PPD. Tender righr chest due to fall and trauma.  Cardiovascular: Positive for palpitations and leg swelling. Negative for chest pain.  Gastrointestinal: Negative for abdominal distention, abdominal pain and anal bleeding.       Hemorrhoids  Endocrine: Negative.        History of diabetes  Genitourinary: Positive for difficulty urinating and frequency.       Incontinence.  Musculoskeletal: Positive for gait problem.       History of falls and gait instability. Chronic right flank discomfort.  Skin: Negative.   Allergic/Immunologic: Negative.   Neurological:  Positive for dizziness.       History of neurocardiogenic syncope. Memory disorder. History of intracerebral bleed in 2003. Subsequently had a small aneurysm treated by Dr. Corliss Skains with a coil. History of shunt to the brain in the left parietal area placed by Dr. Venetia Maxon, neurosurgeon in 2003.  Hematological: Negative.   Psychiatric/Behavioral: Positive for confusion, decreased concentration, dysphoric mood and sleep disturbance.    There were no vitals filed for this visit. There is no height or weight on file to calculate BMI. Wt Readings from Last 3 Encounters:  12/13/16 174 lb (78.9 kg)  04/17/16 173 lb 9.6 oz (78.7 kg)  03/14/16 169 lb (76.7 kg)      Physical Exam  Constitutional: She is oriented to person, place, and time. She appears well-developed and well-nourished. No distress.  HENT:  Right Ear: External ear normal.  Left Ear: External ear normal.  Nose: Nose normal.  Mouth/Throat: Oropharynx is clear and moist. No oropharyngeal exudate.  Ventriculoperitoneal shunt. Upper dentures  Eyes: Conjunctivae and EOM are normal. Pupils are equal, round, and reactive to light. No scleral icterus.  Bilateral  Neck: No JVD present. No tracheal deviation present. No thyromegaly present.  Cardiovascular: Normal rate, regular rhythm, normal heart sounds and intact distal pulses.  Exam reveals no gallop and no friction rub.   No murmur heard. Pulmonary/Chest: Effort normal. No respiratory distress. She has no wheezes. She has no rales. She exhibits no tenderness.  Pacemaker right upper chest Atrophic breasts. Breasts are previous breast cancer surgery Tender right chest wall.  Abdominal: She exhibits no distension and no mass. There is no tenderness.  Genitourinary:  Genitourinary Comments: History of hemorrhoids  Musculoskeletal: Normal range of motion. She exhibits edema (Approximately 1+ bilaterally in lower legs and ankles). She exhibits no tenderness.  Lymphadenopathy:     She has no cervical adenopathy.  Neurological: She is alert and oriented to person, place, and time. No cranial nerve deficit. Coordination normal.  03/14/16 MMSE 28/30.Passed clock drawing.  Skin: No rash noted. She is not diaphoretic. No erythema. No pallor.  Extensive scars left forearm from dog bite. Actinic keratosis of the chest and of the face anterior to the right tragus.  Psychiatric: She has a normal mood and affect. Her behavior is normal. Judgment and thought content normal.    Labs reviewed: Lab Summary Latest Ref Rng & Units 02/05/2017 03/14/2016  Hemoglobin 13.0-17.0 g/dL (None) (None)  Hematocrit 39.0-52.0 % (None) (None)  White count - (None) (None)  Platelet count - (None) (None)  Sodium 135 - 146 mmol/L 125(L) 138  Potassium 3.5 - 5.3 mmol/L 4.1 4.3  Calcium 8.6 - 10.4 mg/dL 9.0 8.8  Phosphorus - (None) (None)  Creatinine 0.60 - 0.93 mg/dL 9.36 7.22  AST 10 - 35  U/L 12 14  Alk Phos 33 - 130 U/L 53 88  Bilirubin 0.2 - 1.2 mg/dL 1.0 0.5  Glucose 65 - 99 mg/dL 105(H) 79  Cholesterol - (None) (None)  HDL cholesterol >39 mg/dL (None) 46  Triglycerides 0 - 149 mg/dL (None) 155(H)  LDL Direct - (None) (None)  LDL Calc 0 - 99 mg/dL (None) 58  Total protein 6.1 - 8.1 g/dL 6.1 (None)  Albumin 3.6 - 5.1 g/dL 4.0 4.3  Some recent data might be hidden   Lab Results  Component Value Date   TSH 3.860 08/17/2015   TSH 1.680 01/20/2015   TSH 1.307 03/31/2013   Lab Results  Component Value Date   BUN 13 02/05/2017   BUN 7 (L) 03/14/2016   BUN 8 08/17/2015   Lab Results  Component Value Date   HGBA1C 5.9 (H) 02/05/2017   HGBA1C CANCELED 03/14/2016   HGBA1C 6.1 (H) 03/14/2016   05/08/14 CT head: IMPRESSION: 1. Parietal scalp hematoma. No evidence of acute intracranial hemorrhage. 2. Stable to minimally increased ventricular dilatation. 3. Stable to minimally increased chronic small vessel ischemic disease. 4. New lacunar infarct in the right corona radiata, likely  chronic  Assessment/Plan  1. History of fall Unstable gait. Using cane.   2. Type 2 diabetes mellitus without complication, without long-term current use of insulin ([place]) controlled  3. Hyperlipidemia, unspecified hyperlipidemia type controlled  4. Essential hypertension controlled  5. Diarrhea, unspecified type Improved on Align  6. Actinic keratosis improved  7. Insomnia, unspecified type improved with melatonin  8. Blepharospasm Increase Klonopin up to 3 times daily.  9. Depression due to head injury See changes below  10. Major depressive disorder with single episode, in partial remission ([place]) Stop Paxil and mirtazipine. - clonazePAM (KLONOPIN) 1 MG tablet; Take one up to three times daily to help blepharospasm.  Dispense: 90 tablet; Refill: 4 - buPROPion (WELLBUTRIN) 100 MG tablet; One twice daily to help nerves and depression  Dispense: 180 tablet; Refill: 4  11. Gastroesophageal reflux disease without esophagitis - pantoprazole (PROTONIX) 40 MG tablet; One daily to reduce stomach acid  Dispense: 90 tablet; Refill: 0  12. Fall, subsequent encounter - HYDROcodone-acetaminophen (NORCO/VICODIN) 5-325 MG tablet; Take 1 tablet every 6 hours as needed for pain  Dispense: 100 tablet; Refill: 0  13. Other chronic pain - HYDROcodone-acetaminophen (NORCO/VICODIN) 5-325 MG tablet; Take 1 tablet every 6 hours as needed for pain  Dispense: 100 tablet; Refill: 0  14. Hyponatremia -BMP , future

## 2017-02-10 ENCOUNTER — Other Ambulatory Visit: Payer: Self-pay | Admitting: Internal Medicine

## 2017-03-01 ENCOUNTER — Encounter: Payer: Self-pay | Admitting: Internal Medicine

## 2017-03-01 ENCOUNTER — Ambulatory Visit: Payer: Self-pay | Admitting: Nurse Practitioner

## 2017-04-16 ENCOUNTER — Ambulatory Visit: Payer: Medicare Other | Admitting: Nurse Practitioner

## 2017-04-22 ENCOUNTER — Other Ambulatory Visit: Payer: Self-pay | Admitting: Internal Medicine

## 2017-05-06 NOTE — Progress Notes (Deleted)
Electrophysiology Office Note Date: 05/06/2017  ID:  Mary, Higgins 04-10-1941, MRN [id number]  PCP: Estill Dooms, MD Primary Cardiologist: previously Dr Rollene Fare Electrophysiologist: [facility]  CC: Pacemaker follow-up  Mary Higgins is a 76 y.o. female is seen today for Dr Rayann Heman.  She presents today for routine electrophysiology followup.  Since last being seen in our clinic, the patient reports doing *** well.  She denies chest pain, palpitations, dyspnea, PND, orthopnea, nausea, vomiting, dizziness, syncope, edema, weight gain, or early satiety.  Device History: STJ dual chamber PPM implanted 2007 for neurocardiogenic syncope; generator change to Biotronik dual chamber pacemaker 09/2014 Dr Rayann Heman   Past Medical History:  Diagnosis Date  . Anxiety and depression    Hx of  . Blepharospasm    . Breast cancer ([place])    s/p lumpectomy  . Carotid bruit    Bilateral. Mild to moderate disease on LEFT and mild on the RIGHT in 2012  . COPD (chronic obstructive pulmonary disease) ([place])   . Diabetes mellitus without complication ([place])    AODM  . Fuchs' corneal dystrophy   . GERD (gastroesophageal reflux disease)    . Hyperlipidemia   . Hypertension    . Insomnia    . Intracerebral bleed ([place]) 2003   Remote intracerebral blled and coiling by Dr. Estanislado Pandy for cerebral aneurysm ans a shunt placed by Dr. Vertell Limber remotely. No seizure disorder and this happened in 2003 with an anterior communicating aneurysm.  Marland Kitchen Neurocardiogenic syncope    a. s/p Biotronik dual chamber pacemaker  . Sinus node dysfunction (HCC)    . Tobacco abuse    . Type II or unspecified type diabetes mellitus without mention of complication, not stated as uncontrolled     Past Surgical History:  Procedure Laterality Date  . ANEURYSM COILING  2003   Cerebral aneurysm ;Dr. Luanne Bras  . BREAST LUMPECTOMY Right    For cancer, no recurrence  . CHOLECYSTECTOMY    . PACEMAKER GENERATOR CHANGE  N/A 09/25/2014   BTK dual chamber pacemkaer implanted by Dr Rayann Heman  . PACEMAKER INSERTION  07/12/2006   [employer] Victory XL DR  -  Dr. Tami Ribas  . PARTIAL HYSTERECTOMY    . VENTRICULOPERITONEAL SHUNT  2003   Dr. Erline Levine    Current Outpatient Prescriptions  Medication Sig Dispense Refill  . aspirin 81 MG tablet Take 81 mg by mouth daily.    Marland Kitchen atorvastatin (LIPITOR) 40 MG tablet TAKE 1 TABLET BY MOUTH EVERY DAY 90 tablet 3  . bifidobacterium infantis (ALIGN) capsule One daily for probiotic to help diarrhea 28 capsule 5  . buPROPion (WELLBUTRIN) 100 MG tablet One twice daily to help nerves and depression 180 tablet 4  . clonazePAM (KLONOPIN) 1 MG tablet Take one up to three times daily to help blepharospasm. 90 tablet 4  . HYDROcodone-acetaminophen (NORCO/VICODIN) 5-325 MG tablet Take 1 tablet every 6 hours as needed for pain 100 tablet 0  . losartan-hydrochlorothiazide (HYZAAR) 100-25 MG tablet One daily to control BP 90 tablet 3  . Melatonin 5 MG TABS 1 nightly as needed for sleep 30 tablet 5  . metFORMIN (GLUCOPHAGE) 500 MG tablet TAKE 1 TABLET BY MOUTH WITH BREAKFAST AND WITH SUPPER FOR DIABETES 180 tablet 2  . metoprolol tartrate (LOPRESSOR) 50 MG tablet TAKE 1 TABLET BY MOUTH TWICE DAILY 60 tablet 0  . midodrine (PROAMATINE) 5 MG tablet Take 0.5 tablets (2.5 mg total) by mouth 2 (two) times daily with a meal.  30 tablet 6  . Multiple Vitamin (MULTIVITAMIN) capsule Take 1 capsule by mouth daily.    . pantoprazole (PROTONIX) 40 MG tablet One daily to reduce stomach acid 90 tablet 0   No current facility-administered medications for this visit.     Allergies:   Oxaprozin   Social History: Social History   Social History  . Marital status: Legally Separated    Spouse name: N/A  . Number of children: N/A  . Years of education: N/A   Occupational History  . Not on file.   Social History Main Topics  . Smoking status: Current Every Day Smoker    Packs/day: 0.50    Years:  15.00    Types: Cigarettes  . Smokeless tobacco: Never Used  . Alcohol use No  . Drug use: No  . Sexual activity: Not on file   Other Topics Concern  . Not on file   Social History Narrative  . No narrative on file    Family History: Family History  Problem Relation Age of Onset  . Cancer Mother   . Heart attack Father   . Hypertension Unknown      Review of Systems: All other systems reviewed and are otherwise negative except as noted above.   Physical Exam: VS:  There were no vitals taken for this visit. , BMI There is no height or weight on file to calculate BMI.  GEN- The patient is well appearing, alert and oriented x 3 today.   HEENT: normocephalic, atraumatic; sclera clear, conjunctiva pink; hearing intact; oropharynx clear; neck supple  Lymph- no cervical lymphadenopathy Lungs- Clear to ausculation bilaterally, normal work of breathing.  No wheezes, rales, rhonchi Heart- Regular rate and rhythm, no murmurs, rubs or gallops  GI- soft, non-tender, non-distended, bowel sounds present Extremities- no clubbing, cyanosis, 1+ BLE edema; DP/PT/radial pulses 2+ bilaterally MS- no significant deformity or atrophy Skin- warm and dry, no rash or lesion; PPM pocket well healed Psych- euthymic mood, full affect Neuro- strength and sensation are intact  PPM Interrogation- reviewed in detail today,  See PACEART report  EKG:  EKG is ordered today. The ekg ordered today shows ***  Recent Labs: 02/05/2017: ALT 10; BUN 13; Creat 0.70; Potassium 4.1; Sodium 125   Wt Readings from Last 3 Encounters:  02/07/17 169 lb (76.7 kg)  12/13/16 174 lb (78.9 kg)  04/17/16 173 lb 9.6 oz (78.7 kg)     Assessment and Plan:  1.  Neurocardiogenic syncope/sinus node dysfunction Normal PPM function See [facility] Art report No changes today Encouraged support hose and adequate hydration  2.  Tobacco abuse Cessation advised  3.  HTN Stable No change required today    Current  medicines are reviewed at length with the patient today.   The patient does not have concerns regarding her medicines.  The following changes were made today:  none  Labs/ tests ordered today include: none   Disposition:   Follow up with Biotronik home monitoring, follow up with Dr Rayann Heman in 1 year   Signed, Chanetta Marshall, NP 05/06/2017 7:41 PM  [facility] [address] [place] [address] [phone] (office) [phone] (fax)

## 2017-05-10 ENCOUNTER — Encounter: Payer: Medicare Other | Admitting: Nurse Practitioner

## 2017-05-11 ENCOUNTER — Encounter: Payer: Self-pay | Admitting: Nurse Practitioner

## 2017-05-11 ENCOUNTER — Encounter: Payer: Medicare Other | Admitting: Nurse Practitioner

## 2017-05-11 ENCOUNTER — Other Ambulatory Visit: Payer: Self-pay | Admitting: Internal Medicine

## 2017-05-17 ENCOUNTER — Telehealth: Payer: Self-pay

## 2017-05-17 ENCOUNTER — Other Ambulatory Visit: Payer: Self-pay | Admitting: Internal Medicine

## 2017-05-17 DIAGNOSIS — K219 Gastro-esophageal reflux disease without esophagitis: Secondary | ICD-10-CM

## 2017-05-17 NOTE — Telephone Encounter (Signed)
I called patient to see if I could get her scheduled with another of our providers at the office since Dr Nyoka Cowden retired. Pt needs medication refills but does not have any upcoming appointments.   Patient's phone number was unavailable so I left a message on patient's son voicemail to have pt call the office.

## 2017-05-18 NOTE — Telephone Encounter (Signed)
I spoke with patient and she stated that she would like for Sherrie Mustache, NP to be new PCP. Pt scheduled follow up appt for 05/21/17.

## 2017-05-21 ENCOUNTER — Ambulatory Visit: Payer: Self-pay | Admitting: Nurse Practitioner

## 2017-06-07 ENCOUNTER — Other Ambulatory Visit: Payer: Self-pay | Admitting: Internal Medicine

## 2017-06-22 ENCOUNTER — Other Ambulatory Visit: Payer: Self-pay | Admitting: Internal Medicine

## 2017-06-22 ENCOUNTER — Other Ambulatory Visit: Payer: Self-pay | Admitting: Nurse Practitioner

## 2017-07-09 ENCOUNTER — Ambulatory Visit: Payer: Medicare Other | Admitting: Nurse Practitioner

## 2017-07-11 ENCOUNTER — Ambulatory Visit (INDEPENDENT_AMBULATORY_CARE_PROVIDER_SITE_OTHER): Payer: Medicare Other | Admitting: Nurse Practitioner

## 2017-07-11 ENCOUNTER — Encounter: Payer: Self-pay | Admitting: Nurse Practitioner

## 2017-07-11 ENCOUNTER — Other Ambulatory Visit: Payer: Self-pay | Admitting: Nurse Practitioner

## 2017-07-11 VITALS — BP 116/72 | HR 63 | Temp 97.8°F | Resp 18 | Ht 69.0 in | Wt 164.2 lb

## 2017-07-11 DIAGNOSIS — W19XXXD Unspecified fall, subsequent encounter: Secondary | ICD-10-CM | POA: Diagnosis not present

## 2017-07-11 DIAGNOSIS — R269 Unspecified abnormalities of gait and mobility: Secondary | ICD-10-CM | POA: Diagnosis not present

## 2017-07-11 DIAGNOSIS — I495 Sick sinus syndrome: Secondary | ICD-10-CM | POA: Diagnosis not present

## 2017-07-11 DIAGNOSIS — E119 Type 2 diabetes mellitus without complications: Secondary | ICD-10-CM | POA: Diagnosis not present

## 2017-07-11 DIAGNOSIS — G245 Blepharospasm: Secondary | ICD-10-CM

## 2017-07-11 DIAGNOSIS — L570 Actinic keratosis: Secondary | ICD-10-CM | POA: Diagnosis not present

## 2017-07-11 DIAGNOSIS — I10 Essential (primary) hypertension: Secondary | ICD-10-CM | POA: Diagnosis not present

## 2017-07-11 DIAGNOSIS — Z853 Personal history of malignant neoplasm of breast: Secondary | ICD-10-CM

## 2017-07-11 DIAGNOSIS — Z720 Tobacco use: Secondary | ICD-10-CM

## 2017-07-11 DIAGNOSIS — F324 Major depressive disorder, single episode, in partial remission: Secondary | ICD-10-CM

## 2017-07-11 DIAGNOSIS — I609 Nontraumatic subarachnoid hemorrhage, unspecified: Secondary | ICD-10-CM | POA: Diagnosis not present

## 2017-07-11 DIAGNOSIS — Z23 Encounter for immunization: Secondary | ICD-10-CM | POA: Diagnosis not present

## 2017-07-11 DIAGNOSIS — R2689 Other abnormalities of gait and mobility: Secondary | ICD-10-CM | POA: Diagnosis not present

## 2017-07-11 DIAGNOSIS — E782 Mixed hyperlipidemia: Secondary | ICD-10-CM | POA: Diagnosis not present

## 2017-07-11 LAB — LIPID PANEL
Cholesterol: 95 mg/dL (ref <200)
HDL: 47 mg/dL — ABNORMAL LOW (ref >50)
LDL Cholesterol: 40 mg/dL (ref <100)
Total CHOL/HDL Ratio: 2 Ratio (ref <5.0)
Triglycerides: 38 mg/dL (ref <150)
VLDL: 8 mg/dL (ref <30)

## 2017-07-11 LAB — COMPLETE METABOLIC PANEL WITH GFR
ALT: 9 U/L (ref 6–29)
AST: 10 U/L (ref 10–35)
Albumin: 4.2 g/dL (ref 3.6–5.1)
Alkaline Phosphatase: 79 U/L (ref 33–130)
BUN: 20 mg/dL (ref 7–25)
CO2: 26 mmol/L (ref 20–32)
Calcium: 9.1 mg/dL (ref 8.6–10.4)
Chloride: 94 mmol/L — ABNORMAL LOW (ref 98–110)
Creat: 1.05 mg/dL — ABNORMAL HIGH (ref 0.60–0.93)
GFR, Est African American: 60 mL/min (ref >=60)
GFR, Est Non African American: 52 mL/min — ABNORMAL LOW (ref >=60)
Glucose, Bld: 108 mg/dL — ABNORMAL HIGH (ref 65–99)
Potassium: 4.5 mmol/L (ref 3.5–5.3)
Sodium: 130 mmol/L — ABNORMAL LOW (ref 135–146)
Total Bilirubin: 0.6 mg/dL (ref 0.2–1.2)
Total Protein: 6.4 g/dL (ref 6.1–8.1)

## 2017-07-11 LAB — CBC WITH DIFFERENTIAL/PLATELET
Basophils Absolute: 0 cells/uL (ref 0–200)
Basophils Relative: 0 %
Eosinophils Absolute: 0 cells/uL — ABNORMAL LOW (ref 15–500)
Eosinophils Relative: 0 %
HCT: 33.4 % — ABNORMAL LOW (ref 35.0–45.0)
Hemoglobin: 11.4 g/dL — ABNORMAL LOW (ref 11.7–15.5)
Lymphocytes Relative: 22 %
Lymphs Abs: 2266 cells/uL (ref 850–3900)
MCH: 29.5 pg (ref 27.0–33.0)
MCHC: 34.1 g/dL (ref 32.0–36.0)
MCV: 86.5 fL (ref 80.0–100.0)
MPV: 9.1 fL (ref 7.5–12.5)
Monocytes Absolute: 618 cells/uL (ref 200–950)
Monocytes Relative: 6 %
Neutro Abs: 7416 cells/uL (ref 1500–7800)
Neutrophils Relative %: 72 %
Platelets: 320 10*3/uL (ref 140–400)
RBC: 3.86 MIL/uL (ref 3.80–5.10)
RDW: 13.9 % (ref 11.0–15.0)
WBC: 10.3 10*3/uL (ref 3.8–10.8)

## 2017-07-11 MED ORDER — ZOSTER VAC RECOMB ADJUVANTED 50 MCG/0.5ML IM SUSR: 0.5000 mL | Freq: Once | INTRAMUSCULAR | 1 refills | Status: AC

## 2017-07-11 MED ORDER — TETANUS-DIPHTHERIA TOXOIDS TD 5-2 LFU IM INJ: 0.5000 mL | INJECTION | Freq: Once | INTRAMUSCULAR | 0 refills | Status: AC

## 2017-07-11 MED ORDER — BUPROPION HCL ER (SR) 150 MG PO TB12: 150.0000 mg | ORAL_TABLET | Freq: Two times a day (BID) | ORAL | 1 refills | Status: DC

## 2017-07-11 NOTE — Progress Notes (Signed)
Careteam: Patient Care Team: Estill Dooms, MD as PCP - General (Internal Medicine) Patsey Berthold, NP as Nurse Practitioner (Cardiology) Darleen Crocker, MD as Consulting Physician (Ophthalmology) Thompson Grayer, MD as Consulting Physician (Cardiology)  Advanced Directive information Does Patient Have a Medical Advance Directive?: No  Allergies  Allergen Reactions  . Oxaprozin Rash    Chief Complaint  Patient presents with  . Medical Management of Chronic Issues    Pt is being seen for a routine visit. Patient wants to discuss getting PT for weakness  . Other    Son, [name], in room     HPI: Patient is a 76 y.o. female seen in the office today for routine follow up. Former pt of Dr Nyoka Cowden. Lives with son who is here today during visit.  Pt with hx of DM, HTN, hyperlipidemia, edema, recurrent fall, insomnia.  Here in office today with son conts with recurrent falls- reports 15 in the last year. Son very concerned over this and would like PT, does not feel like she does any exercise.   Pt with AK diagnosed at last visit- dermatology follow up with recommended however she never went. "lost paper that they sent her" States she would like another referral   Hyperlipidemia- taking Lipitor daily, son says they attempt to follow heart healthy. Fasting today.   Diarrhea- none- not taking align- was at one time with samples but did not continue  Has a BM every other day  Depression with anxiety- taking bupropion twice daily unsure if that is really helping per son; previously was on paxil but stopped this.   Taking clonazepam three times daily due to blepharospasm.  Medication helps a little bit for blepharospasm but without would not be tolerable. Has maxed on on botox- was not helping. Last saw ophthalmologist for this 4 months ago and they stated there was nothing else that could be done.   Insomnia- taking melatonin just abt every night for sleep.   Hypertension- taking  losartan-HTCZ, attempts low sodium diet. Metoprolol tartrate twice daily.   Neurocardiogenic syncope/ sinus node dysfunction S/p pacemaker- following having this checked by cardiology, appears she was placed on midodrine and was titrated off. Still on medication list but not taking.   DM- taking metformin twice daily- checks occasionally at home- last check was 143; will check randomly twice weekly. No hypoglycemia.   GERD- protonix daily for ingestion, controlled on this.   Frequent falls- over 15 falls in the last year- son suspects this is due to lack of activity and exercise. Cardiology did not feel like it was cardiac in nature and felt like she needed neurology work up but this has not been done. conts on clonazepam for blepharospams.   Last fall she was in her closet and feel forward. Pt does not remember anything just knows she went down. Denies LOC.  Son reports she is normally moving when she goes down.  At times she does feel weak but more unstable.   Hx of breast cancer- overdue for appt for oncology and a few years since she has had a mammogram   Tobacco abuse- conts to smoke but "not much" 1 pack per week.    Review of Systems:  Review of Systems  Constitutional: Positive for weight loss (in the last year; appetite decreased). Negative for chills and fever.  HENT: Positive for hearing loss.   Respiratory: Negative for cough, sputum production and shortness of breath.   Cardiovascular: Positive for leg swelling (  minimally, mostly feet). Negative for chest pain and palpitations.  Gastrointestinal: Positive for heartburn (controlled on protonix). Negative for abdominal pain, constipation and diarrhea.  Genitourinary: Positive for urgency. Negative for dysuria and frequency.       Incontinence of urine; wears pads  Musculoskeletal: Positive for falls. Negative for back pain, joint pain and myalgias.  Skin: Negative.   Neurological: Positive for headaches (chronic hx of  headaches). Negative for dizziness.  Psychiatric/Behavioral: Positive for depression and memory loss. The patient does not have insomnia.     Past Medical History:  Diagnosis Date  . Anxiety and depression    Hx of  . Blepharospasm    . Breast cancer ([place])    s/p lumpectomy  . Carotid bruit    Bilateral. Mild to moderate disease on LEFT and mild on the RIGHT in 2012  . COPD (chronic obstructive pulmonary disease) ([place])   . Diabetes mellitus without complication ([place])    AODM  . Fuchs' corneal dystrophy   . GERD (gastroesophageal reflux disease)    . Hyperlipidemia   . Hypertension    . Insomnia    . Intracerebral bleed ([place]) 2003   Remote intracerebral blled and coiling by Dr. Estanislado Pandy for cerebral aneurysm ans a shunt placed by Dr. Vertell Limber remotely. No seizure disorder and this happened in 2003 with an anterior communicating aneurysm.  Marland Kitchen Neurocardiogenic syncope    a. s/p Biotronik dual chamber pacemaker  . Sinus node dysfunction (HCC)    . Tobacco abuse    . Type II or unspecified type diabetes mellitus without mention of complication, not stated as uncontrolled     Past Surgical History:  Procedure Laterality Date  . ANEURYSM COILING  2003   Cerebral aneurysm ;Dr. Luanne Bras  . BREAST LUMPECTOMY Right    For cancer, no recurrence  . CHOLECYSTECTOMY    . PACEMAKER GENERATOR CHANGE N/A 09/25/2014   BTK dual chamber pacemkaer implanted by Dr Rayann Heman  . PACEMAKER INSERTION  07/12/2006   St. Jude Victory XL DR  -  Dr. Tami Ribas  . PARTIAL HYSTERECTOMY    . VENTRICULOPERITONEAL SHUNT  2003   Dr. Erline Levine   Social History:   reports that she has been smoking Cigarettes.  She has a 7.50 pack-year smoking history. She has never used smokeless tobacco. She reports that she does not drink alcohol or use drugs.  Family History  Problem Relation Age of Onset  . Cancer Mother   . Heart attack Father   . Hypertension Unknown     Medications: Patient's Medications    New Prescriptions   No medications on file  Previous Medications   ASPIRIN 81 MG TABLET    Take 81 mg by mouth daily.   ATORVASTATIN (LIPITOR) 40 MG TABLET    TAKE 1 TABLET BY MOUTH EVERY DAY   BUPROPION (WELLBUTRIN) 100 MG TABLET    One twice daily to help nerves and depression   CLONAZEPAM (KLONOPIN) 1 MG TABLET    Take one up to three times daily to help blepharospasm.   LOSARTAN-HYDROCHLOROTHIAZIDE (HYZAAR) 100-25 MG TABLET    One daily to control BP   MELATONIN 5 MG TABS    1 nightly as needed for sleep   METFORMIN (GLUCOPHAGE) 500 MG TABLET    TAKE 1 TABLET BY MOUTH WITH BREAKFAST AND WITH SUPPER FOR DIABETES. Appointment needed for refills.   METOPROLOL TARTRATE (LOPRESSOR) 50 MG TABLET    TAKE 1 TABLET BY MOUTH TWICE DAILY   PANTOPRAZOLE (  PROTONIX) 40 MG TABLET    TAKE 1 TABLET BY MOUTH EVERY DAY TO REDUCE STOMACH ACID  Modified Medications   Modified Medication Previous Medication   TETANUS & DIPHTHERIA TOXOIDS, ADULT, (TENIVAC) 5-2 LFU INJECTION tetanus & diphtheria toxoids, adult, (TENIVAC) 5-2 LFU injection      Inject 0.5 mLs into the muscle once.    Inject 0.5 mLs into the muscle once.   ZOSTER VAC RECOMB ADJUVANTED (SHINGRIX) INJECTION Zoster Vac Recomb Adjuvanted (SHINGRIX) injection      Inject 0.5 mLs into the muscle once.    Inject 0.5 mLs into the muscle once.  Discontinued Medications   BIFIDOBACTERIUM INFANTIS (ALIGN) CAPSULE    One daily for probiotic to help diarrhea   HYDROCODONE-ACETAMINOPHEN (NORCO/VICODIN) 5-325 MG TABLET    Take 1 tablet every 6 hours as needed for pain   MIDODRINE (PROAMATINE) 5 MG TABLET    Take 0.5 tablets (2.5 mg total) by mouth 2 (two) times daily with a meal.   MULTIPLE VITAMIN (MULTIVITAMIN) CAPSULE    Take 1 capsule by mouth daily.     Physical Exam:  Vitals:   07/11/17 0909  BP: 116/72  Pulse: 63  Resp: 18  Temp: 97.8 F (36.6 C)  TempSrc: Oral  SpO2: 98%  Weight: 164 lb 3.2 oz (74.5 kg)  Height: '5\' 9"'$  (1.753 m)   Body  mass index is 24.25 kg/m.  Physical Exam  Constitutional: She is oriented to person, place, and time. She appears well-developed and well-nourished. No distress.  HENT:  Right Ear: External ear normal.  Left Ear: External ear normal.  Nose: Nose normal.  Mouth/Throat: Oropharynx is clear and moist. No oropharyngeal exudate.  Ventriculoperitoneal shunt. Upper dentures  Eyes: Pupils are equal, round, and reactive to light. Conjunctivae and EOM are normal. No scleral icterus.  Neck: No JVD present. No tracheal deviation present. No thyromegaly present.  Cardiovascular: Normal rate, regular rhythm, normal heart sounds and intact distal pulses.  Exam reveals no gallop and no friction rub.   No murmur heard. Pulmonary/Chest: Effort normal and breath sounds normal.  Pacemaker right upper chest  Abdominal: Soft. Bowel sounds are normal. She exhibits no distension and no mass. There is no tenderness.  Musculoskeletal: Normal range of motion. She exhibits edema (pedal). She exhibits no tenderness.  Lymphadenopathy:    She has no cervical adenopathy.  Neurological: She is alert and oriented to person, place, and time. No cranial nerve deficit. Coordination normal.  03/14/16 MMSE 28/30.Passed clock drawing. 07/11/16 MMSE 26/30. Passed clock   Skin: Skin is warm and dry. No rash noted. She is not diaphoretic. No erythema. No pallor.  Extensive scars left forearm from dog bite. Actinic keratosis of the chest and of the face anterior to the right tragus.  Psychiatric: She has a normal mood and affect.    Labs reviewed: Basic Metabolic Panel:  Recent Labs  02/05/17 1218  NA 125*  K 4.1  CL 87*  CO2 27  GLUCOSE 105*  BUN 13  CREATININE 0.70  CALCIUM 9.0   Liver Function Tests:  Recent Labs  02/05/17 1218  AST 12  ALT 10  ALKPHOS 53  BILITOT 1.0  PROT 6.1  ALBUMIN 4.0   No results for input(s): LIPASE, AMYLASE in the last 8760 hours. No results for input(s): AMMONIA in the last  8760 hours. CBC: No results for input(s): WBC, NEUTROABS, HGB, HCT, MCV, PLT in the last 8760 hours. Lipid Panel: No results for input(s): CHOL, HDL, LDLCALC, TRIG,  CHOLHDL, LDLDIRECT in the last 8760 hours. TSH: No results for input(s): TSH in the last 8760 hours. A1C: Lab Results  Component Value Date   HGBA1C 5.9 (H) 02/05/2017   Depression screen [facility] 2/9 07/11/2017 03/14/2016 01/21/2015  Decreased Interest 3 3 0  Down, Depressed, Hopeless 3 3 0  PHQ - 2 Score 6 6 0  Altered sleeping 3 3 -  Tired, decreased energy 3 3 -  Change in appetite 0 1 -  Feeling bad or failure about yourself  3 3 -  Trouble concentrating 1 0 -  Moving slowly or fidgety/restless 0 0 -  Suicidal thoughts 0 0 -  PHQ-9 Score 16 16 -  Difficult doing work/chores Extremely dIfficult - -     Assessment/Plan 1. Major depressive disorder with single episode, in partial remission ([place]) -depression screening unchanged. Stated the bupropion originally helped but now not seeing as much benefit.  -will increase Wellbutrin from 100 to 150 BID  - buPROPion (WELLBUTRIN SR) 150 MG 12 hr tablet; Take 1 tablet (150 mg total) by mouth 2 (two) times daily.  Dispense: 60 tablet; Refill: 1 -may also benefit from therapy -no SI or HI  2. Fall, subsequent encounter -may be related to medication, neurological cause or debility. Cardiology does not feel like falls are cardiac related.  - TSH - Ambulatory referral to Neurosurgery to have V-P shunt evaluated  - Ambulatory referral to Physical Therapy  3. Actinic keratosis -never followed up.  - Ambulatory referral to Dermatology  4. Imbalance See #6&7 - Ambulatory referral to Physical Therapy for further evaluation and gait training as well.   5. Sinus node dysfunction (HCC) S/p pacemaker, followed by EP lab and cardiology.   6. Subarachnoid hemorrhage ([place]) In 2003; Hx with abnormal gait and balance issue. May need neurology follow up but will send to neurosurgery to  have v-p shut evaluated.   7. Abnormality of gait Pt with hx of cerebral aneurysm after SAH s/p coiling in 2003 and ventriculomegaly s/p V-P shunt. Appears gait has been impaired for a while based on medical records but son reports this has been worse recently.  - Ambulatory referral to Neurosurgery  - Ambulatory referral to Physical Therapy  8. Tobacco abuse Smoking cessation   9. Hx of breast cancer -has not had follow up in many years  - MM DIGITAL SCREENING BILATERAL; Future - Ambulatory referral to Hematology / Oncology  10. Mixed hyperlipidemia conts on lipitor and dietary modifications.  - CMP with eGFR - Lipid panel  11. Essential hypertension Blood pressure controlled on metoprolol and losartan-hctz - CBC with Differential/Platelets  12. Diabetes mellitus without complication ([employer]) -conts on metformin and dietary modifications.  -following with Heron Nay ophthalmologist.  - Hemoglobin A1c - Pneumococcal polysaccharide vaccine 23-valent greater than or equal to 2yo subcutaneous/IM  13. Blepharospasm -has maxed out botox per son, conts on clonazepam which helps some but she is still having more bad days then good.   14. Need for influenza vaccination - Flu vaccine HIGH DOSE PF (Fluzone High dose)  15. Need for 23-polyvalent pneumococcal polysaccharide vaccine - Pneumococcal polysaccharide vaccine 23-valent greater than or equal to 2yo subcutaneous/IM  Total time 50 mins time greater than 50% of total time spent doing pt counseling and coordination of care on the above diagnoses Next appt: 4 weeks on depression, sooner If needed  Peniel Hass K. Harle Battiest  [facility] [phone] 8 am - 5 pm) [phone] (after hours)

## 2017-07-11 NOTE — Patient Instructions (Addendum)
Will increase Wellbutrin to 150 mg twice daily at this time.   Referral placed for ophthalmology we need them to see her to do an evaluation on her twitching hopefully there is a better option.

## 2017-07-12 ENCOUNTER — Other Ambulatory Visit: Payer: Self-pay | Admitting: *Deleted

## 2017-07-12 ENCOUNTER — Other Ambulatory Visit: Payer: Self-pay | Admitting: Nurse Practitioner

## 2017-07-12 DIAGNOSIS — D649 Anemia, unspecified: Secondary | ICD-10-CM

## 2017-07-12 DIAGNOSIS — I10 Essential (primary) hypertension: Secondary | ICD-10-CM

## 2017-07-12 DIAGNOSIS — E871 Hypo-osmolality and hyponatremia: Secondary | ICD-10-CM

## 2017-07-12 DIAGNOSIS — F324 Major depressive disorder, single episode, in partial remission: Secondary | ICD-10-CM

## 2017-07-12 LAB — HEMOGLOBIN A1C
Hgb A1c MFr Bld: 5.8 % — ABNORMAL HIGH (ref <5.7)
Mean Plasma Glucose: 120 mg/dL

## 2017-07-12 LAB — TSH: TSH: 2.08 mIU/L

## 2017-07-12 MED ORDER — CLONAZEPAM 1 MG PO TABS: ORAL_TABLET | ORAL | 3 refills | Status: DC

## 2017-07-12 MED ORDER — LOSARTAN POTASSIUM 100 MG PO TABS: 100.0000 mg | ORAL_TABLET | Freq: Every day | ORAL | 3 refills | Status: DC

## 2017-07-12 NOTE — Telephone Encounter (Signed)
Patient requested. Printed and faxed [place] checked.

## 2017-07-25 ENCOUNTER — Other Ambulatory Visit: Payer: Self-pay | Admitting: Nurse Practitioner

## 2017-07-25 DIAGNOSIS — K219 Gastro-esophageal reflux disease without esophagitis: Secondary | ICD-10-CM

## 2017-08-04 ENCOUNTER — Other Ambulatory Visit: Payer: Self-pay | Admitting: Nurse Practitioner

## 2017-08-04 ENCOUNTER — Other Ambulatory Visit: Payer: Self-pay | Admitting: Internal Medicine

## 2017-08-06 ENCOUNTER — Encounter: Payer: Self-pay | Admitting: Nurse Practitioner

## 2017-08-09 ENCOUNTER — Telehealth: Payer: Self-pay | Admitting: Oncology

## 2017-08-09 NOTE — Telephone Encounter (Signed)
An appt has been scheduled for the pt to see Dr. Jana Hakim on 10/23 ar 430pm. Referral from [facility]. Pt was last seen in 2005.

## 2017-08-10 ENCOUNTER — Encounter: Payer: Self-pay | Admitting: Nurse Practitioner

## 2017-08-14 ENCOUNTER — Telehealth: Payer: Self-pay | Admitting: Nurse Practitioner

## 2017-08-14 NOTE — Telephone Encounter (Signed)
I spoke with the patient's son and explained that the patient only needs to come for her 1:00 appointment tomorrow, and that we need to reschedule her AWV (BCBS should only be scheduled with provider).  He stated that he will have her to call us when he gets home. VDM (DD)

## 2017-08-15 ENCOUNTER — Ambulatory Visit: Payer: Medicare Other

## 2017-08-15 ENCOUNTER — Ambulatory Visit: Payer: Medicare Other | Admitting: Nurse Practitioner

## 2017-08-18 NOTE — Progress Notes (Signed)
Electrophysiology Office Note Date: 08/21/2017  ID:  Mary, Higgins 1941/08/06, MRN [id number]  PCP: [name], NP Primary Cardiologist: previously Dr Rollene Fare Electrophysiologist: [facility]  CC: Pacemaker follow-up  Mary Higgins is a 76 y.o. female is seen today for Dr Rayann Heman.  She presents today for routine electrophysiology followup.  Since last being seen in our clinic, the patient reports increased falls.  Her falls have been occurring more frequently in the last few weeks. They are not associated with dizziness, syncope, palpitations, or near syncope.  She states her "legs just give out". She has been referred to PT by her PCP.  She denies chest pain, palpitations, dyspnea, PND, orthopnea, nausea, vomiting, dizziness, syncope, edema, weight gain, or early satiety.  Device History: STJ dual chamber PPM implanted 2007 for neurocardiogenic syncope; generator change to Biotronik dual chamber pacemaker 09/2014 Dr Rayann Heman   Past Medical History:  Diagnosis Date  . Anxiety and depression    Hx of  . Blepharospasm    . Breast cancer ([place])    s/p lumpectomy  . Carotid bruit    Bilateral. Mild to moderate disease on LEFT and mild on the RIGHT in 2012  . COPD (chronic obstructive pulmonary disease) ([place])   . Diabetes mellitus without complication ([place])    AODM  . Fuchs' corneal dystrophy   . GERD (gastroesophageal reflux disease)    . Hyperlipidemia   . Hypertension    . Insomnia    . Intracerebral bleed ([place]) 2003   Remote intracerebral blled and coiling by Dr. Estanislado Pandy for cerebral aneurysm ans a shunt placed by Dr. Vertell Limber remotely. No seizure disorder and this happened in 2003 with an anterior communicating aneurysm.  Marland Kitchen Neurocardiogenic syncope    a. s/p Biotronik dual chamber pacemaker  . Sinus node dysfunction (HCC)    . Tobacco abuse    . Type II or unspecified type diabetes mellitus without mention of complication, not stated as uncontrolled      Past Surgical History:  Procedure Laterality Date  . ANEURYSM COILING  2003   Cerebral aneurysm ;Dr. Luanne Bras  . BREAST LUMPECTOMY Right    For cancer, no recurrence  . CHOLECYSTECTOMY    . PACEMAKER GENERATOR CHANGE N/A 09/25/2014   BTK dual chamber pacemkaer implanted by Dr Rayann Heman  . PACEMAKER INSERTION  07/12/2006   St. Jude Victory XL DR  -  Dr. Tami Ribas  . PARTIAL HYSTERECTOMY    . VENTRICULOPERITONEAL SHUNT  2003   Dr. Erline Levine    Current Outpatient Prescriptions  Medication Sig Dispense Refill  . aspirin 81 MG tablet Take 81 mg by mouth daily.    Marland Kitchen atorvastatin (LIPITOR) 40 MG tablet TAKE 1 TABLET BY MOUTH EVERY DAY 90 tablet 1  . buPROPion (WELLBUTRIN SR) 150 MG 12 hr tablet Take 1 tablet (150 mg total) by mouth 2 (two) times daily. 60 tablet 1  . clonazePAM (KLONOPIN) 1 MG tablet Take one up to three times daily to help blepharospasm. 90 tablet 3  . losartan (COZAAR) 100 MG tablet Take 1 tablet (100 mg total) by mouth daily. 90 tablet 3  . Melatonin 5 MG TABS 1 nightly as needed for sleep 30 tablet 5  . metFORMIN (GLUCOPHAGE) 500 MG tablet TAKE 1 TABLET BY MOUTH WITH BREAKFAST AND WITH SUPPER FOR DIABETES 30 tablet 6  . metoprolol tartrate (LOPRESSOR) 50 MG tablet TAKE 1 TABLET BY MOUTH TWICE DAILY 60 tablet 0  . pantoprazole (PROTONIX) 40 MG tablet  TAKE 1 TABLET BY MOUTH DAILY TO REDUCE STOMACH ACID 90 tablet 2   No current facility-administered medications for this visit.     Allergies:   Oxaprozin   Social History: Social History   Social History  . Marital status: Legally Separated    Spouse name: N/A  . Number of children: N/A  . Years of education: N/A   Occupational History  . Not on file.   Social History Main Topics  . Smoking status: Current Every Day Smoker    Packs/day: 0.50    Years: 15.00    Types: Cigarettes  . Smokeless tobacco: Never Used  . Alcohol use No  . Drug use: No  . Sexual activity: Not Currently   Other Topics  Concern  . Not on file   Social History Narrative  . No narrative on file    Family History: Family History  Problem Relation Age of Onset  . Cancer Mother   . Heart attack Father   . Hypertension Unknown      Review of Systems: All other systems reviewed and are otherwise negative except as noted above.   Physical Exam: VS:  BP 110/68   Pulse 78   Ht 5\' 11"  (1.803 m)   Wt 164 lb 12.8 oz (74.8 kg)   LMP  (LMP Unknown)   BMI 22.98 kg/m  , BMI Body mass index is 22.98 kg/m.  GEN- The patient is elderly appearing, alert and oriented x 3 today.   HEENT: normocephalic, atraumatic; sclera clear, conjunctiva pink; hearing intact; oropharynx clear; neck supple  Lungs- Clear to ausculation bilaterally, normal work of breathing.  No wheezes, rales, rhonchi Heart- Regular rate and rhythm  GI- soft, non-tender, non-distended, bowel sounds present Extremities- no clubbing, cyanosis, 1+ BLE edema  MS- no significant deformity or atrophy Skin- warm and dry, no rash or lesion; PPM pocket well healed Psych- euthymic mood, full affect Neuro- strength and sensation are intact  PPM Interrogation- reviewed in detail today,  See PACEART report  EKG:  EKG is not ordered today.   Recent Labs: 07/11/2017: ALT 9; BUN 20; Creat 1.05; Hemoglobin 11.4; Platelets 320; Potassium 4.5; Sodium 130; TSH 2.08   Wt Readings from Last 3 Encounters:  08/21/17 164 lb 12.8 oz (74.8 kg)  07/11/17 164 lb 3.2 oz (74.5 kg)  02/07/17 169 lb (76.7 kg)     Assessment and Plan:  1.  Neurocardiogenic syncope/sinus node dysfunction Normal PPM function See Pace Art report No changes today Encouraged adequate hydration  2.  HTN Stable No change required today  3.  Falls I am not convinced by history that these are related to her neurocardiogenic syncope.  They occur without prodrome and are not associated with other symptoms. She is clear that she has not passed out. Agree with PT and neurology  referral as placed by PCP.  Her increase in falls has taken a toll on her son who she lives with. He is considering short term rehab.  I have encouraged them to keep working with primary care.   Current medicines are reviewed at length with the patient today.   The patient does not have concerns regarding her medicines.  The following changes were made today: none  Labs/ tests ordered today include: none   Disposition:   Follow up with Biotronik home monitoring, follow up with Dr Rayann Heman in 1 year   Signed, Chanetta Marshall, NP 08/21/2017 1:07 PM  [facility] [address] [place]  [place] [address] [phone] (office) [phone] (fax)

## 2017-08-21 ENCOUNTER — Encounter: Payer: Self-pay | Admitting: Nurse Practitioner

## 2017-08-21 ENCOUNTER — Ambulatory Visit (INDEPENDENT_AMBULATORY_CARE_PROVIDER_SITE_OTHER): Payer: Medicare Other | Admitting: Nurse Practitioner

## 2017-08-21 VITALS — BP 110/68 | HR 78 | Ht 71.0 in | Wt 164.8 lb

## 2017-08-21 DIAGNOSIS — R55 Syncope and collapse: Secondary | ICD-10-CM

## 2017-08-21 DIAGNOSIS — I10 Essential (primary) hypertension: Secondary | ICD-10-CM | POA: Diagnosis not present

## 2017-08-21 LAB — CUP PACEART INCLINIC DEVICE CHECK
Date Time Interrogation Session: 20181009130556
Implantable Lead Implant Date: 20070830
Implantable Lead Implant Date: 20070830
Implantable Lead Location: 753859
Implantable Lead Location: 753860
Implantable Pulse Generator Implant Date: 20151113
Pulse Gen Model: 394931
Pulse Gen Serial Number: 68408118

## 2017-08-21 NOTE — Patient Instructions (Addendum)
Medication Instructions:   Your physician recommends that you continue on your current medications as directed. Please refer to the Current Medication list given to you today.    If you need a refill on your cardiac medications before your next appointment, please call your pharmacy.  Labwork: NONE ORDERED  TODAY    Testing/Procedures: NONE ORDERED  TODAY    Follow-Up:  Remote monitoring is used to monitor your Pacemaker of ICD from home. This monitoring reduces the number of office visits required to check your device to one time per year. It allows [place] to keep an eye on the functioning of your device to ensure it is working properly. You are scheduled for a device check from home on . AS CHEDULEDYou may send your transmission at any time that day. If you have a wireless device, the transmission will be sent automatically. After your physician reviews your transmission, you will receive a postcard with your next transmission date.  Your physician wants you to follow-up in: [place] will receive a reminder letter in the mail two months in advance. If you don't receive a letter, please call our office to schedule the follow-up appointment.     Any Other Special Instructions Will Be Listed Below (If Applicable).

## 2017-08-22 ENCOUNTER — Encounter: Payer: Medicare Other | Admitting: Nurse Practitioner

## 2017-09-04 ENCOUNTER — Ambulatory Visit: Payer: Medicare Other | Admitting: Oncology

## 2017-09-13 ENCOUNTER — Other Ambulatory Visit: Payer: Self-pay | Admitting: Oncology

## 2017-10-20 ENCOUNTER — Other Ambulatory Visit: Payer: Self-pay | Admitting: Internal Medicine

## 2017-10-23 ENCOUNTER — Other Ambulatory Visit: Payer: Self-pay | Admitting: Nurse Practitioner

## 2017-10-23 ENCOUNTER — Other Ambulatory Visit: Payer: Self-pay | Admitting: Internal Medicine

## 2017-10-23 DIAGNOSIS — F324 Major depressive disorder, single episode, in partial remission: Secondary | ICD-10-CM

## 2017-10-23 MED ORDER — BUPROPION HCL ER (SR) 150 MG PO TB12: ORAL_TABLET | ORAL | 0 refills | Status: DC

## 2017-10-23 NOTE — Telephone Encounter (Signed)
Agree thank you 

## 2017-10-23 NOTE — Telephone Encounter (Signed)
Rx faxed to pharmacy  

## 2017-10-23 NOTE — Telephone Encounter (Signed)
Rx Request from [facility]. Noted patient to schedule an appointment before any future refills.

## 2017-10-29 ENCOUNTER — Encounter: Payer: Self-pay | Admitting: Nurse Practitioner

## 2017-10-30 ENCOUNTER — Other Ambulatory Visit: Payer: Self-pay | Admitting: Internal Medicine

## 2017-10-30 DIAGNOSIS — F324 Major depressive disorder, single episode, in partial remission: Secondary | ICD-10-CM

## 2017-10-31 NOTE — Telephone Encounter (Signed)
Left message on voicemail for patient to return call when available,   Reason for call: Patient is due for an appointment

## 2017-11-01 ENCOUNTER — Other Ambulatory Visit: Payer: Self-pay

## 2017-11-01 DIAGNOSIS — F324 Major depressive disorder, single episode, in partial remission: Secondary | ICD-10-CM

## 2017-11-01 MED ORDER — CLONAZEPAM 1 MG PO TABS: ORAL_TABLET | ORAL | 0 refills | Status: DC

## 2017-11-01 NOTE — Telephone Encounter (Signed)
Patient's son called to see about getting a refill to last patient until next appointment on 11/07/17. Mary Higgins agreed to a refill for #21.   Rx was pended to provider after verifying last fill date, provider, and quantity on PMP [place].

## 2017-11-01 NOTE — Telephone Encounter (Signed)
PATIENT NEEDS TO BE SEEN FOR FURTHER REFILLS

## 2017-11-07 ENCOUNTER — Ambulatory Visit (INDEPENDENT_AMBULATORY_CARE_PROVIDER_SITE_OTHER): Payer: Medicare Other | Admitting: Nurse Practitioner

## 2017-11-07 ENCOUNTER — Encounter: Payer: Self-pay | Admitting: Nurse Practitioner

## 2017-11-07 VITALS — BP 116/72 | HR 69 | Temp 98.3°F | Resp 18 | Ht 69.0 in | Wt 162.0 lb

## 2017-11-07 DIAGNOSIS — E782 Mixed hyperlipidemia: Secondary | ICD-10-CM

## 2017-11-07 DIAGNOSIS — G245 Blepharospasm: Secondary | ICD-10-CM

## 2017-11-07 DIAGNOSIS — F324 Major depressive disorder, single episode, in partial remission: Secondary | ICD-10-CM | POA: Diagnosis not present

## 2017-11-07 DIAGNOSIS — D649 Anemia, unspecified: Secondary | ICD-10-CM

## 2017-11-07 DIAGNOSIS — R269 Unspecified abnormalities of gait and mobility: Secondary | ICD-10-CM | POA: Diagnosis not present

## 2017-11-07 DIAGNOSIS — E119 Type 2 diabetes mellitus without complications: Secondary | ICD-10-CM

## 2017-11-07 DIAGNOSIS — I10 Essential (primary) hypertension: Secondary | ICD-10-CM

## 2017-11-07 DIAGNOSIS — K219 Gastro-esophageal reflux disease without esophagitis: Secondary | ICD-10-CM

## 2017-11-07 DIAGNOSIS — F17200 Nicotine dependence, unspecified, uncomplicated: Secondary | ICD-10-CM

## 2017-11-07 MED ORDER — CLONAZEPAM 1 MG PO TABS: ORAL_TABLET | ORAL | 1 refills | Status: DC

## 2017-11-07 MED ORDER — METFORMIN HCL 500 MG PO TABS: ORAL_TABLET | ORAL | 1 refills | Status: DC

## 2017-11-07 NOTE — Progress Notes (Signed)
Careteam: Patient Care Team: Mary Chandler, NP as PCP - General (Geriatric Medicine) Mary Berthold, NP as Nurse Practitioner (Cardiology) Mary Crocker, MD as Consulting Physician (Ophthalmology) Mary Grayer, MD as Consulting Physician (Cardiology)  Advanced Directive information    Allergies  Allergen Reactions  . Oxaprozin Rash    Chief Complaint  Patient presents with  . Medical Management of Chronic Issues    Pt is being seen for a routine visit and medication management  . Other    Son, Mary Higgins, in room  . Medication Refill    Refill pended for clonazepam and metformin. Pt unsure if any other refills needed.      HPI: Patient is a 76 y.o. female seen in the office today for routine follow up.  Son at appt today. Reports he is not sure but doing a whole lot better since last visit. No falls and "making more sense"   Last visit lab work noted elevated Cr with low sodium therefore HCTZ was stopped and to only take losartan 100 mg by mouth daily.  hgb was down but never followed up lab work   Neurosurgery referral was placed due to hx of cerebral aneuysm with SAH s/p coiling and ventriculometry s/p v-p shunt but they did not go to this referral because son had a MI and this has limited things but they plan on going.   Due to depression wellbutrin was increased to 150 mg by mouth twice daily which has helped. No side effects noted.   conts on clonazepam 1 mg TID due to blepharospasm.   No falls in quite a while have been working on exercises.   GERD- increase GERD in the last week, taking protonix 40 mg by mouth daily. Reports food is getting stuck when she swallows and feels like it is going the wrong way only been going on 1 week.  No coughing when she eats.    Review of Systems:  Review of Systems  Constitutional: Positive for weight loss (in the last year; appetite decreased). Negative for chills and fever.  HENT: Positive for hearing loss.     Respiratory: Negative for cough, sputum production and shortness of breath.        Smoker  Cardiovascular: Positive for leg swelling (minimally, mostly feet). Negative for chest pain and palpitations.  Gastrointestinal: Positive for heartburn (controlled on protonix). Negative for abdominal pain, constipation and diarrhea.  Genitourinary: Positive for urgency. Negative for dysuria and frequency.       Incontinence of urine; wears pads  Musculoskeletal: Negative for back pain, falls, joint pain and myalgias.  Skin: Negative.   Neurological: Positive for headaches (chronic hx of headaches). Negative for dizziness.  Psychiatric/Behavioral: Positive for depression (has improved) and memory loss. The patient does not have insomnia.     Past Medical History:  Diagnosis Date  . Anxiety and depression    Hx of  . Blepharospasm    . Breast cancer ([place])    s/p lumpectomy  . Carotid bruit    Bilateral. Mild to moderate disease on LEFT and mild on the RIGHT in 2012  . COPD (chronic obstructive pulmonary disease) ([place])   . Diabetes mellitus without complication ([place])    AODM  . Fuchs' corneal dystrophy   . GERD (gastroesophageal reflux disease)    . Hyperlipidemia   . Hypertension    . Insomnia    . Intracerebral bleed ([place]) 2003   Remote intracerebral blled and coiling by Dr. Estanislado Pandy for  cerebral aneurysm ans a shunt placed by Dr. Vertell Limber remotely. No seizure disorder and this happened in 2003 with an anterior communicating aneurysm.  Marland Kitchen Neurocardiogenic syncope    a. s/p Biotronik dual chamber pacemaker  . Sinus node dysfunction ([facility])    . Tobacco abuse    . Type II or unspecified type diabetes mellitus without mention of complication, not stated as uncontrolled     Past Surgical History:  Procedure Laterality Date  . ANEURYSM COILING  2003   Cerebral aneurysm ;Dr. Luanne Bras  . BREAST LUMPECTOMY Right    For cancer, no recurrence  . CHOLECYSTECTOMY    . PACEMAKER GENERATOR  CHANGE N/A 09/25/2014   BTK dual chamber pacemkaer implanted by Dr Rayann Heman  . PACEMAKER INSERTION  07/12/2006   St[employer] Jude Victory XL DR  -  Dr. Tami Ribas  . PARTIAL HYSTERECTOMY    . VENTRICULOPERITONEAL SHUNT  2003   Dr. Erline Levine   Social History:   reports that she has been smoking cigarettes.  She has a 7.50 pack-year smoking history. she has never used smokeless tobacco. She reports that she does not drink alcohol or use drugs.  Family History  Problem Relation Age of Onset  . Cancer Mother   . Heart attack Father   . Hypertension Unknown     Medications:   Medication List        Accurate as of 11/07/17 11:07 AM. Always use your most recent med list.          aspirin 81 MG tablet   atorvastatin 40 MG tablet Commonly known as:  LIPITOR TAKE 1 TABLET BY MOUTH EVERY DAY   buPROPion 150 MG 12 hr tablet Commonly known as:  WELLBUTRIN SR Take one tablet by mouth twice daily. Needs appointment before future refills.   clonazePAM 1 MG tablet Commonly known as:  KLONOPIN Take one up to three times daily to help blepharospasm.   losartan 100 MG tablet Commonly known as:  COZAAR Take 1 tablet (100 mg total) by mouth daily.   Melatonin 5 MG Tabs 1 nightly as needed for sleep   metFORMIN 500 MG tablet Commonly known as:  GLUCOPHAGE TAKE 1 TABLET BY MOUTH WITH BREAKFAST AND WITH SUPPER FOR DIABETES   metoprolol tartrate 50 MG tablet Commonly known as:  LOPRESSOR TAKE 1 TABLET BY MOUTH TWICE DAILY   pantoprazole 40 MG tablet Commonly known as:  PROTONIX TAKE 1 TABLET BY MOUTH DAILY TO REDUCE STOMACH ACID        Physical Exam:  Vitals:   11/07/17 1058  BP: 116/72  Pulse: 69  Resp: 18  Temp: 98.3 F (36.8 C)  TempSrc: Oral  SpO2: 97%  Weight: 162 lb (73.5 kg)  Height: _0  (1.753 m)   Body mass index is 23.92 kg/m.  Physical Exam  Constitutional: She is oriented to person, place, and time. She appears well-developed and well-nourished. No  distress.  HENT:  Nose: Nose normal.  Mouth/Throat: Oropharynx is clear and moist. No oropharyngeal exudate.  Ventriculoperitoneal shunt. Upper dentures  Eyes: Conjunctivae and EOM are normal. Pupils are equal, round, and reactive to light. No scleral icterus.  Neck: No JVD present. No tracheal deviation present. No thyromegaly present.  Cardiovascular: Normal rate, regular rhythm and normal heart sounds.  Pulmonary/Chest: Effort normal. She has wheezes in the right lower field.  Pacemaker right upper chest  Abdominal: Soft. Bowel sounds are normal. She exhibits no distension and no mass. There is no tenderness.  Musculoskeletal:  Normal range of motion. She exhibits edema (+1 bilaterally). She exhibits no tenderness.  Lymphadenopathy:    She has no cervical adenopathy.  Neurological: She is alert and oriented to person, place, and time. No cranial nerve deficit. Coordination normal.  03/14/16 MMSE 28/30.Passed clock drawing. 07/11/16 MMSE 26/30. Passed clock   Skin: Skin is warm and dry. No rash noted. She is not diaphoretic. No erythema. No pallor.  Extensive scars left forearm from dog bite. Actinic keratosis of the chest and of the face anterior to the right tragus.  Psychiatric: She has a normal mood and affect.   Labs reviewed: Basic Metabolic Panel: Recent Labs    02/05/17 1218 07/11/17 1046  NA 125* 130*  K 4.1 4.5  CL 87* 94*  CO2 27 26  GLUCOSE 105* 108*  BUN 13 20  CREATININE 0.70 1.05*  CALCIUM 9.0 9.1  TSH  --  2.08   Liver Function Tests: Recent Labs    02/05/17 1218 07/11/17 1046  AST 12 10  ALT 10 9  ALKPHOS 53 79  BILITOT 1.0 0.6  PROT 6.1 6.4  ALBUMIN 4.0 4.2   No results for input(s): LIPASE, AMYLASE in the last 8760 hours. No results for input(s): AMMONIA in the last 8760 hours. CBC: Recent Labs    07/11/17 1046  WBC 10.3  NEUTROABS 7,416  HGB 11.4*  HCT 33.4*  MCV 86.5  PLT 320   Lipid Panel: Recent Labs    07/11/17 1046  CHOL 95    HDL 47*  LDLCALC 40  TRIG 38  CHOLHDL 2.0   TSH: Recent Labs    07/11/17 1046  TSH 2.08   A1C: Lab Results  Component Value Date   HGBA1C 5.8 (H) 07/11/2017     Assessment/Plan 1. Major depressive disorder with single episode, in partial remission ([place]) -improved with wellbutrin BID.   2. Abnormality of gait Has improved with exercises, conts to use cane. No recurrent fall.   3. Mixed hyperlipidemia -LDL at goal on last labs, will cont lipitor.  - CMP with eGFR  4. Essential hypertension Stable on losartan and metoprolol   5. Diabetes mellitus without complication ([place]) -C3U at goal on previous, will follow up today, cont current regimen - metFORMIN (GLUCOPHAGE) 500 MG tablet; TAKE 1 TABLET BY MOUTH WITH BREAKFAST AND WITH SUPPER FOR DIABETES  Dispense: 90 tablet; Refill: 1 - Hemoglobin A1c  6. Blepharospasm - clonazePAM (KLONOPIN) 1 MG tablet; Take one up to three times daily to help blepharospasm.  Dispense: 90 tablet; Refill: 1  7. Anemia, unspecified type Slight drop in hgb from last labs which were 2 years ago, will follow up today - CBC with Differential/Platelets  8. Smoker Smoking cessation encouraged, education provided.   9. GERD Increase symptoms this week, discussed possible swallow evaluation due to feeling of food getting stuck/not passing however would like to see if this persist before further evaluation. Will cont protonix and gave diet and lifestyle modificatons   Next appt: follow up in 4 months.  Carlos American. Harle Battiest  [facility] [phone] 8 am - 5 pm) [phone] (after hours)

## 2017-11-07 NOTE — Patient Instructions (Signed)
Cont to cut back on smoking    Food Choices for Gastroesophageal Reflux Disease, Adult When you have gastroesophageal reflux disease (GERD), the foods you eat and your eating habits are very important. Choosing the right foods can help ease your discomfort. What guidelines do I need to follow?  Choose fruits, vegetables, whole grains, and low-fat dairy products.  Choose low-fat meat, fish, and poultry.  Limit fats such as oils, salad dressings, butter, nuts, and avocado.  Keep a food diary. This helps you identify foods that cause symptoms.  Avoid foods that cause symptoms. These may be different for everyone.  Eat small meals often instead of 3 large meals a day.  Eat your meals slowly, in a place where you are relaxed.  Limit fried foods.  Cook foods using methods other than frying.  Avoid drinking alcohol.  Avoid drinking large amounts of liquids with your meals.  Avoid bending over or lying down until 2-3 hours after eating. What foods are not recommended? These are some foods and drinks that may make your symptoms worse: Vegetables Tomatoes. Tomato juice. Tomato and spaghetti sauce. Chili peppers. Onion and garlic. Horseradish. Fruits Oranges, grapefruit, and lemon (fruit and juice). Meats High-fat meats, fish, and poultry. This includes hot dogs, ribs, ham, sausage, salami, and bacon. Dairy Whole milk and chocolate milk. Sour cream. Cream. Butter. Ice cream. Cream cheese. Drinks Coffee and tea. Bubbly (carbonated) drinks or energy drinks. Condiments Hot sauce. Barbecue sauce. Sweets/Desserts Chocolate and cocoa. Donuts. Peppermint and spearmint. Fats and Oils High-fat foods. This includes [place] fries and potato chips. Other Vinegar. Strong spices. This includes black pepper, white pepper, red pepper, cayenne, curry powder, cloves, ginger, and chili powder. The items listed above may not be a complete list of foods and drinks to avoid. Contact your dietitian  for more information. This information is not intended to replace advice given to you by your health care provider. Make sure you discuss any questions you have with your health care provider. Document Released: 04/30/2012 Document Revised: 04/06/2016 Document Reviewed: 09/03/2013 Elsevier Interactive Patient Education  2017 [employer].  Steps to Quit Smoking Smoking tobacco can be bad for your health. It can also affect almost every organ in your body. Smoking puts you and people around you at risk for many serious long-lasting (chronic) diseases. Quitting smoking is hard, but it is one of the best things that you can do for your health. It is never too late to quit. What are the benefits of quitting smoking? When you quit smoking, you lower your risk for getting serious diseases and conditions. They can include:  Lung cancer or lung disease.  Heart disease.  Stroke.  Heart attack.  Not being able to have children (infertility).  Weak bones (osteoporosis) and broken bones (fractures).  If you have coughing, wheezing, and shortness of breath, those symptoms may get better when you quit. You may also get sick less often. If you are pregnant, quitting smoking can help to lower your chances of having a baby of low birth weight. What can I do to help me quit smoking? Talk with your doctor about what can help you quit smoking. Some things you can do (strategies) include:  Quitting smoking totally, instead of slowly cutting back how much you smoke over a period of time.  Going to in-person counseling. You are more likely to quit if you go to many counseling sessions.  Using resources and support systems, such as: ? Database administrator with a Social worker. ?  Phone quitlines. ? Careers information officer. ? Support groups or group counseling. ? Text messaging programs. ? Mobile phone apps or applications.  Taking medicines. Some of these medicines may have nicotine in them. If you are  pregnant or breastfeeding, do not take any medicines to quit smoking unless your doctor says it is okay. Talk with your doctor about counseling or other things that can help you.  Talk with your doctor about using more than one strategy at the same time, such as taking medicines while you are also going to in-person counseling. This can help make quitting easier. What things can I do to make it easier to quit? Quitting smoking might feel very hard at first, but there is a lot that you can do to make it easier. Take these steps:  Talk to your family and friends. Ask them to support and encourage you.  Call phone quitlines, reach out to support groups, or work with a Social worker.  Ask people who smoke to not smoke around you.  Avoid places that make you want (trigger) to smoke, such as: ? Bars. ? Parties. ? Smoke-break areas at work.  Spend time with people who do not smoke.  Lower the stress in your life. Stress can make you want to smoke. Try these things to help your stress: ? Getting regular exercise. ? Deep-breathing exercises. ? Yoga. ? Meditating. ? Doing a body scan. To do this, close your eyes, focus on one area of your body at a time from head to toe, and notice which parts of your body are tense. Try to relax the muscles in those areas.  Download or buy apps on your mobile phone or tablet that can help you stick to your quit plan. There are many free apps, such as QuitGuide from the [employer] Office manager for Disease Control and Prevention). You can find more support from smokefree.gov and other websites.  This information is not intended to replace advice given to you by your health care provider. Make sure you discuss any questions you have with your health care provider. Document Released: 08/26/2009 Document Revised: 06/27/2016 Document Reviewed: 03/16/2015 Elsevier Interactive Patient Education  2018 [employer].

## 2017-11-08 ENCOUNTER — Other Ambulatory Visit: Payer: Self-pay

## 2017-11-08 DIAGNOSIS — E875 Hyperkalemia: Secondary | ICD-10-CM

## 2017-11-08 LAB — CBC WITH DIFFERENTIAL/PLATELET
Basophils Absolute: 92 cells/uL (ref 0–200)
Basophils Relative: 1.4 %
Eosinophils Absolute: 158 cells/uL (ref 15–500)
Eosinophils Relative: 2.4 %
HCT: 35.1 % (ref 35.0–45.0)
Hemoglobin: 11.9 g/dL (ref 11.7–15.5)
Lymphs Abs: 2099 cells/uL (ref 850–3900)
MCH: 28 pg (ref 27.0–33.0)
MCHC: 33.9 g/dL (ref 32.0–36.0)
MCV: 82.6 fL (ref 80.0–100.0)
MPV: 10.1 fL (ref 7.5–12.5)
Monocytes Relative: 8.2 %
Neutro Abs: 3709 cells/uL (ref 1500–7800)
Neutrophils Relative %: 56.2 %
Platelets: 250 10*3/uL (ref 140–400)
RBC: 4.25 10*6/uL (ref 3.80–5.10)
RDW: 13.5 % (ref 11.0–15.0)
Total Lymphocyte: 31.8 %
WBC mixed population: 541 cells/uL (ref 200–950)
WBC: 6.6 10*3/uL (ref 3.8–10.8)

## 2017-11-08 LAB — COMPLETE METABOLIC PANEL WITH GFR
AG Ratio: 2.2 (calc) (ref 1.0–2.5)
ALT: 10 U/L (ref 6–29)
AST: 13 U/L (ref 10–35)
Albumin: 4.1 g/dL (ref 3.6–5.1)
Alkaline phosphatase (APISO): 71 U/L (ref 33–130)
BUN: 13 mg/dL (ref 7–25)
CO2: 25 mmol/L (ref 20–32)
Calcium: 9.1 mg/dL (ref 8.6–10.4)
Chloride: 97 mmol/L — ABNORMAL LOW (ref 98–110)
Creat: 0.68 mg/dL (ref 0.60–0.93)
GFR, Est African American: 98 mL/min/{1.73_m2} (ref >=60)
GFR, Est Non African American: 85 mL/min/{1.73_m2} (ref >=60)
Globulin: 1.9 g/dL (calc) (ref 1.9–3.7)
Glucose, Bld: 86 mg/dL (ref 65–99)
Potassium: 5.5 mmol/L — ABNORMAL HIGH (ref 3.5–5.3)
Sodium: 130 mmol/L — ABNORMAL LOW (ref 135–146)
Total Bilirubin: 0.7 mg/dL (ref 0.2–1.2)
Total Protein: 6 g/dL — ABNORMAL LOW (ref 6.1–8.1)

## 2017-11-08 LAB — HEMOGLOBIN A1C
Hgb A1c MFr Bld: 6 % of total Hgb — ABNORMAL HIGH (ref <5.7)
Mean Plasma Glucose: 126 (calc)
eAG (mmol/L): 7 (calc)

## 2017-11-15 ENCOUNTER — Other Ambulatory Visit: Payer: Medicare Other

## 2017-11-15 DIAGNOSIS — E875 Hyperkalemia: Secondary | ICD-10-CM | POA: Diagnosis not present

## 2017-11-15 LAB — BASIC METABOLIC PANEL WITH GFR
BUN: 17 mg/dL (ref 7–25)
CO2: 28 mmol/L (ref 20–32)
Calcium: 8.9 mg/dL (ref 8.6–10.4)
Chloride: 99 mmol/L (ref 98–110)
Creat: 0.8 mg/dL (ref 0.60–0.93)
GFR, Est African American: 83 mL/min/{1.73_m2} (ref >=60)
GFR, Est Non African American: 72 mL/min/{1.73_m2} (ref >=60)
Glucose, Bld: 113 mg/dL — ABNORMAL HIGH (ref 65–99)
Potassium: 4.6 mmol/L (ref 3.5–5.3)
Sodium: 133 mmol/L — ABNORMAL LOW (ref 135–146)

## 2017-11-18 ENCOUNTER — Other Ambulatory Visit: Payer: Self-pay | Admitting: Nurse Practitioner

## 2017-11-18 DIAGNOSIS — F324 Major depressive disorder, single episode, in partial remission: Secondary | ICD-10-CM

## 2017-11-26 ENCOUNTER — Ambulatory Visit: Payer: Medicare Other | Admitting: Internal Medicine

## 2018-01-03 ENCOUNTER — Other Ambulatory Visit: Payer: Self-pay | Admitting: Nurse Practitioner

## 2018-01-03 DIAGNOSIS — G245 Blepharospasm: Secondary | ICD-10-CM

## 2018-01-05 ENCOUNTER — Other Ambulatory Visit: Payer: Self-pay | Admitting: Nurse Practitioner

## 2018-01-05 DIAGNOSIS — K219 Gastro-esophageal reflux disease without esophagitis: Secondary | ICD-10-CM

## 2018-01-07 ENCOUNTER — Other Ambulatory Visit: Payer: Self-pay | Admitting: Nurse Practitioner

## 2018-01-07 DIAGNOSIS — G245 Blepharospasm: Secondary | ICD-10-CM

## 2018-01-08 ENCOUNTER — Other Ambulatory Visit: Payer: Self-pay | Admitting: *Deleted

## 2018-01-08 MED ORDER — ATORVASTATIN CALCIUM 40 MG PO TABS: 40.0000 mg | ORAL_TABLET | Freq: Every day | ORAL | 2 refills | Status: DC

## 2018-01-16 ENCOUNTER — Other Ambulatory Visit: Payer: Self-pay | Admitting: Nurse Practitioner

## 2018-01-16 DIAGNOSIS — Z1231 Encounter for screening mammogram for malignant neoplasm of breast: Secondary | ICD-10-CM

## 2018-02-01 ENCOUNTER — Ambulatory Visit: Payer: Medicare Other

## 2018-02-01 ENCOUNTER — Ambulatory Visit
Admission: RE | Admit: 2018-02-01 | Discharge: 2018-02-01 | Disposition: A | Discharge status: Home / Self Care | Payer: Medicare Other | Source: Ambulatory Visit | Attending: Nurse Practitioner | Admitting: Nurse Practitioner

## 2018-02-01 DIAGNOSIS — Z1231 Encounter for screening mammogram for malignant neoplasm of breast: Secondary | ICD-10-CM | POA: Diagnosis not present

## 2018-02-01 HISTORY — DX: Personal history of irradiation: Z92.3

## 2018-03-05 ENCOUNTER — Other Ambulatory Visit: Payer: Self-pay | Admitting: Nurse Practitioner

## 2018-03-05 DIAGNOSIS — G245 Blepharospasm: Secondary | ICD-10-CM

## 2018-03-05 DIAGNOSIS — E119 Type 2 diabetes mellitus without complications: Secondary | ICD-10-CM

## 2018-03-05 DIAGNOSIS — K219 Gastro-esophageal reflux disease without esophagitis: Secondary | ICD-10-CM

## 2018-03-06 ENCOUNTER — Other Ambulatory Visit: Payer: Self-pay | Admitting: Nurse Practitioner

## 2018-03-06 ENCOUNTER — Ambulatory Visit (INDEPENDENT_AMBULATORY_CARE_PROVIDER_SITE_OTHER): Payer: Medicare Other | Admitting: *Deleted

## 2018-03-06 DIAGNOSIS — I495 Sick sinus syndrome: Secondary | ICD-10-CM

## 2018-03-06 DIAGNOSIS — F324 Major depressive disorder, single episode, in partial remission: Secondary | ICD-10-CM

## 2018-03-06 NOTE — Progress Notes (Signed)
Remote pacemaker transmission.   

## 2018-03-06 NOTE — Telephone Encounter (Signed)
Yes this is okay 

## 2018-03-06 NOTE — Telephone Encounter (Signed)
A medication refill was received from pharmacy for clonazepam 1 mg. Rx was pended to provider for approval  after verifying last fill date, provider, and quantity on PMP [place]

## 2018-03-07 LAB — CUP PACEART REMOTE DEVICE CHECK
Date Time Interrogation Session: 20190425134220
Implantable Lead Implant Date: 20070830
Implantable Lead Implant Date: 20070830
Implantable Lead Location: 753859
Implantable Lead Location: 753860
Implantable Pulse Generator Implant Date: 20151113
Pulse Gen Model: 394931
Pulse Gen Serial Number: 68408118

## 2018-03-08 ENCOUNTER — Encounter: Payer: Self-pay | Admitting: Cardiology

## 2018-04-04 ENCOUNTER — Other Ambulatory Visit: Payer: Self-pay | Admitting: Nurse Practitioner

## 2018-04-04 DIAGNOSIS — G245 Blepharospasm: Secondary | ICD-10-CM

## 2018-04-04 NOTE — Telephone Encounter (Signed)
[place] Database verified and compliance confirmed   

## 2018-04-05 ENCOUNTER — Other Ambulatory Visit: Payer: Self-pay | Admitting: Nurse Practitioner

## 2018-04-05 DIAGNOSIS — F324 Major depressive disorder, single episode, in partial remission: Secondary | ICD-10-CM

## 2018-04-05 DIAGNOSIS — I10 Essential (primary) hypertension: Secondary | ICD-10-CM

## 2018-04-17 ENCOUNTER — Other Ambulatory Visit: Payer: Self-pay | Admitting: Nurse Practitioner

## 2018-04-17 DIAGNOSIS — E119 Type 2 diabetes mellitus without complications: Secondary | ICD-10-CM

## 2018-04-23 ENCOUNTER — Telehealth: Payer: Self-pay | Admitting: Nurse Practitioner

## 2018-04-23 NOTE — Telephone Encounter (Signed)
I called the mobile number to schedule AWV and follow up appt w/ Janett Billow.  Someone answered and said it's the wrong number.  I then called the home number listed and left a message asking the pt's son to call me at [phone] to schedule AWV w/ Clarise Cruz and follow up appt w/ Janett Billow. VDM (DD)

## 2018-05-05 ENCOUNTER — Other Ambulatory Visit: Payer: Self-pay | Admitting: Nurse Practitioner

## 2018-05-05 DIAGNOSIS — F324 Major depressive disorder, single episode, in partial remission: Secondary | ICD-10-CM

## 2018-05-06 ENCOUNTER — Other Ambulatory Visit: Payer: Self-pay | Admitting: Nurse Practitioner

## 2018-05-06 DIAGNOSIS — G245 Blepharospasm: Secondary | ICD-10-CM

## 2018-05-06 DIAGNOSIS — E119 Type 2 diabetes mellitus without complications: Secondary | ICD-10-CM

## 2018-05-06 NOTE — Telephone Encounter (Signed)
I called and left a message asking the pt to call me at [phone] to schedule AWV w/ Clarise Cruz.  If pt calls back to the office, I would like to try and reschedule her AWV for 05/13/18 at 12:30 and CPE 05/13/18 at 1:30. VDM (DD)

## 2018-05-07 ENCOUNTER — Encounter: Payer: Medicare Other | Admitting: Nurse Practitioner

## 2018-05-07 NOTE — Telephone Encounter (Signed)
Pending appointment 05/13/18   [place] Database verified and compliance confirmed

## 2018-05-13 ENCOUNTER — Ambulatory Visit (INDEPENDENT_AMBULATORY_CARE_PROVIDER_SITE_OTHER): Payer: Medicare Other | Admitting: Nurse Practitioner

## 2018-05-13 ENCOUNTER — Ambulatory Visit (INDEPENDENT_AMBULATORY_CARE_PROVIDER_SITE_OTHER): Payer: Medicare Other

## 2018-05-13 ENCOUNTER — Encounter: Payer: Self-pay | Admitting: Nurse Practitioner

## 2018-05-13 VITALS — BP 132/80 | HR 91 | Temp 98.5°F | Ht 69.0 in | Wt 183.0 lb

## 2018-05-13 VITALS — BP 132/80 | HR 91 | Temp 97.5°F | Ht 69.0 in | Wt 183.0 lb

## 2018-05-13 DIAGNOSIS — F324 Major depressive disorder, single episode, in partial remission: Secondary | ICD-10-CM | POA: Diagnosis not present

## 2018-05-13 DIAGNOSIS — E782 Mixed hyperlipidemia: Secondary | ICD-10-CM | POA: Diagnosis not present

## 2018-05-13 DIAGNOSIS — G245 Blepharospasm: Secondary | ICD-10-CM

## 2018-05-13 DIAGNOSIS — R635 Abnormal weight gain: Secondary | ICD-10-CM | POA: Diagnosis not present

## 2018-05-13 DIAGNOSIS — Z0000 Encounter for general adult medical examination without abnormal findings: Secondary | ICD-10-CM

## 2018-05-13 DIAGNOSIS — M810 Age-related osteoporosis without current pathological fracture: Secondary | ICD-10-CM

## 2018-05-13 DIAGNOSIS — J309 Allergic rhinitis, unspecified: Secondary | ICD-10-CM | POA: Diagnosis not present

## 2018-05-13 DIAGNOSIS — E119 Type 2 diabetes mellitus without complications: Secondary | ICD-10-CM | POA: Diagnosis not present

## 2018-05-13 DIAGNOSIS — I609 Nontraumatic subarachnoid hemorrhage, unspecified: Secondary | ICD-10-CM

## 2018-05-13 DIAGNOSIS — I10 Essential (primary) hypertension: Secondary | ICD-10-CM

## 2018-05-13 MED ORDER — TETANUS-DIPHTH-ACELL PERTUSSIS 5-2.5-18.5 LF-MCG/0.5 IM SUSP: 0.5000 mL | Freq: Once | INTRAMUSCULAR | 0 refills | Status: AC

## 2018-05-13 NOTE — Patient Instructions (Addendum)
Mary Higgins , Thank you for taking time to come for your Medicare Wellness Visit. I appreciate your ongoing commitment to your health goals. Please review the following plan we discussed and let me know if I can assist you in the future.   Screening recommendations/referrals: Colonoscopy excluded, over age 77 Mammogram up to date, due 02/02/2020 Bone Density up to date Recommended yearly ophthalmology/optometry visit for glaucoma screening and checkup Recommended yearly dental visit for hygiene and checkup  Vaccinations: Influenza vaccine up to date. Due 2019 fall season Pneumococcal vaccine up to date, completed Tdap vaccine due, ordered to pharmacy Shingles vaccine up to date, completed    Advanced directives: Advance directive discussed with you today. I have provided a copy for you to complete at home and have notarized. Once this is complete please bring a copy in to our office so we can scan it into your chart.  Conditions/risks identified: none  Next appointment: Tyson Dense, RN 05/19/2019 @ 10:30am   Preventive Care 65 Years and Older, Female Preventive care refers to lifestyle choices and visits with your health care provider that can promote health and wellness. What does preventive care include?  A yearly physical exam. This is also called an annual well check.  Dental exams once or twice a year.  Routine eye exams. Ask your health care provider how often you should have your eyes checked.  Personal lifestyle choices, including:  Daily care of your teeth and gums.  Regular physical activity.  Eating a healthy diet.  Avoiding tobacco and drug use.  Limiting alcohol use.  Practicing safe sex.  Taking low-dose aspirin every day.  Taking vitamin and mineral supplements as recommended by your health care provider. What happens during an annual well check? The services and screenings done by your health care provider during your annual well check will depend on  your age, overall health, lifestyle risk factors, and family history of disease. Counseling  Your health care provider may ask you questions about your:  Alcohol use.  Tobacco use.  Drug use.  Emotional well-being.  Home and relationship well-being.  Sexual activity.  Eating habits.  History of falls.  Memory and ability to understand (cognition).  Work and work Statistician.  Reproductive health. Screening  You may have the following tests or measurements:  Height, weight, and BMI.  Blood pressure.  Lipid and cholesterol levels. These may be checked every 5 years, or more frequently if you are over 79 years old.  Skin check.  Lung cancer screening. You may have this screening every year starting at age 84 if you have a 30-pack-year history of smoking and currently smoke or have quit within the past 15 years.  Fecal occult blood test (FOBT) of the stool. You may have this test every year starting at age 33.  Flexible sigmoidoscopy or colonoscopy. You may have a sigmoidoscopy every 5 years or a colonoscopy every 10 years starting at age 20.  Hepatitis C blood test.  Hepatitis B blood test.  Sexually transmitted disease (STD) testing.  Diabetes screening. This is done by checking your blood sugar (glucose) after you have not eaten for a while (fasting). You may have this done every 1-3 years.  Bone density scan. This is done to screen for osteoporosis. You may have this done starting at age 63.  Mammogram. This may be done every 1-2 years. Talk to your health care provider about how often you should have regular mammograms. Talk with your health care provider about  your test results, treatment options, and if necessary, the need for more tests. Vaccines  Your health care provider may recommend certain vaccines, such as:  Influenza vaccine. This is recommended every year.  Tetanus, diphtheria, and acellular pertussis (Tdap, Td) vaccine. You may need a Td booster  every 10 years.  Zoster vaccine. You may need this after age 18.  Pneumococcal 13-valent conjugate (PCV13) vaccine. One dose is recommended after age 28.  Pneumococcal polysaccharide (PPSV23) vaccine. One dose is recommended after age 62. Talk to your health care provider about which screenings and vaccines you need and how often you need them. This information is not intended to replace advice given to you by your health care provider. Make sure you discuss any questions you have with your health care provider. Document Released: 11/26/2015 Document Revised: 07/19/2016 Document Reviewed: 08/31/2015 Elsevier Interactive Patient Education  2017 [place] Prevention in the Home Falls can cause injuries. They can happen to people of all ages. There are many things you can do to make your home safe and to help prevent falls. What can I do on the outside of my home?  Regularly fix the edges of walkways and driveways and fix any cracks.  Remove anything that might make you trip as you walk through a door, such as a raised step or threshold.  Trim any bushes or trees on the path to your home.  Use bright outdoor lighting.  Clear any walking paths of anything that might make someone trip, such as rocks or tools.  Regularly check to see if handrails are loose or broken. Make sure that both sides of any steps have handrails.  Any raised decks and porches should have guardrails on the edges.  Have any leaves, snow, or ice cleared regularly.  Use sand or salt on walking paths during winter.  Clean up any spills in your garage right away. This includes oil or grease spills. What can I do in the bathroom?  Use night lights.  Install grab bars by the toilet and in the tub and shower. Do not use towel bars as grab bars.  Use non-skid mats or decals in the tub or shower.  If you need to sit down in the shower, use a plastic, non-slip stool.  Keep the floor dry. Clean up any  water that spills on the floor as soon as it happens.  Remove soap buildup in the tub or shower regularly.  Attach bath mats securely with double-sided non-slip rug tape.  Do not have throw rugs and other things on the floor that can make you trip. What can I do in the bedroom?  Use night lights.  Make sure that you have a light by your bed that is easy to reach.  Do not use any sheets or blankets that are too big for your bed. They should not hang down onto the floor.  Have a firm chair that has side arms. You can use this for support while you get dressed.  Do not have throw rugs and other things on the floor that can make you trip. What can I do in the kitchen?  Clean up any spills right away.  Avoid walking on wet floors.  Keep items that you use a lot in easy-to-reach places.  If you need to reach something above you, use a strong step stool that has a grab bar.  Keep electrical cords out of the way.  Do not use floor polish or wax  that makes floors slippery. If you must use wax, use non-skid floor wax.  Do not have throw rugs and other things on the floor that can make you trip. What can I do with my stairs?  Do not leave any items on the stairs.  Make sure that there are handrails on both sides of the stairs and use them. Fix handrails that are broken or loose. Make sure that handrails are as long as the stairways.  Check any carpeting to make sure that it is firmly attached to the stairs. Fix any carpet that is loose or worn.  Avoid having throw rugs at the top or bottom of the stairs. If you do have throw rugs, attach them to the floor with carpet tape.  Make sure that you have a light switch at the top of the stairs and the bottom of the stairs. If you do not have them, ask someone to add them for you. What else can I do to help prevent falls?  Wear shoes that:  Do not have high heels.  Have rubber bottoms.  Are comfortable and fit you well.  Are closed  at the toe. Do not wear sandals.  If you use a stepladder:  Make sure that it is fully opened. Do not climb a closed stepladder.  Make sure that both sides of the stepladder are locked into place.  Ask someone to hold it for you, if possible.  Clearly mark and make sure that you can see:  Any grab bars or handrails.  First and last steps.  Where the edge of each step is.  Use tools that help you move around (mobility aids) if they are needed. These include:  Canes.  Walkers.  Scooters.  Crutches.  Turn on the lights when you go into a dark area. Replace any light bulbs as soon as they burn out.  Set up your furniture so you have a clear path. Avoid moving your furniture around.  If any of your floors are uneven, fix them.  If there are any pets around you, be aware of where they are.  Review your medicines with your doctor. Some medicines can make you feel dizzy. This can increase your chance of falling. Ask your doctor what other things that you can do to help prevent falls. This information is not intended to replace advice given to you by your health care provider. Make sure you discuss any questions you have with your health care provider. Document Released: 08/26/2009 Document Revised: 04/06/2016 Document Reviewed: 12/04/2014 Elsevier Interactive Patient Education  2017 [employer].

## 2018-05-13 NOTE — Progress Notes (Signed)
Subjective:   Mary Higgins is a 77 y.o. female who presents for Medicare Annual (Subsequent) preventive examination.  Last AWV-03/14/2016    Objective:     Vitals: BP 132/80 (BP Location: Left Arm, Patient Position: Sitting)   Pulse 91   Temp (!) 97.5 F (36.4 C) (Oral)   Ht 5\' 9"  (1.753 m)   Wt 183 lb (83 kg)   LMP  (LMP Unknown)   SpO2 95%   BMI 27.02 kg/m   Body mass index is 27.02 kg/m.  Advanced Directives 05/13/2018 07/11/2017 02/07/2017 12/13/2016 08/31/2016 03/14/2016 08/18/2015  Does Patient Have a Medical Advance Directive? No No No No No No No  Does patient want to make changes to medical advance directive? Yes (MAU/Ambulatory/Procedural Areas - Information given) - - - - - -  Would patient like information on creating a medical advance directive? Yes (MAU/Ambulatory/Procedural Areas - Information given) - - - No - patient declined information No - patient declined information Yes - Educational materials given    Tobacco Social History   Tobacco Use  Smoking Status Former Smoker  . Packs/day: 0.50  . Years: 15.00  . Pack years: 7.50  . Types: Cigarettes  Smokeless Tobacco Never Used  Tobacco Comment   quit for 2 weeks as of 05/13/2018, has the occasional cigarrette     Counseling given: Not Answered Comment: quit for 2 weeks as of 05/13/2018, has the occasional cigarrette   Clinical Intake:  Pre-visit preparation completed: No  Pain : No/denies pain     Nutritional Risks: Unintentional weight gain Diabetes: Yes CBG done?: No Did pt. bring in CBG monitor from home?: No  How often do you need to have someone help you when you read instructions, pamphlets, or other written materials from your doctor or pharmacy?: 2 - Rarely What is the last grade level you completed in school?: [employer]  Interpreter Needed?: No  Information entered by :: Tyson Dense, RN  Past Medical History:  Diagnosis Date  . Anxiety and depression    Hx of  .  Blepharospasm    . Breast cancer ([place])    s/p lumpectomy  . Carotid bruit    Bilateral. Mild to moderate disease on LEFT and mild on the RIGHT in 2012  . COPD (chronic obstructive pulmonary disease) ([place])   . Diabetes mellitus without complication ([place])    AODM  . Fuchs' corneal dystrophy   . GERD (gastroesophageal reflux disease)    . Hyperlipidemia   . Hypertension    . Insomnia    . Intracerebral bleed ([place]) 2003   Remote intracerebral blled and coiling by Dr. Estanislado Pandy for cerebral aneurysm ans a shunt placed by Dr. Vertell Limber remotely. No seizure disorder and this happened in 2003 with an anterior communicating aneurysm.  Marland Kitchen Neurocardiogenic syncope    a. s/p Biotronik dual chamber pacemaker  . Personal history of radiation therapy   . Sinus node dysfunction ([facility])    . Tobacco abuse    . Type II or unspecified type diabetes mellitus without mention of complication, not stated as uncontrolled     Past Surgical History:  Procedure Laterality Date  . ANEURYSM COILING  2003   Cerebral aneurysm ;Dr. Luanne Bras  . BREAST LUMPECTOMY Right    For cancer, no recurrence  . CHOLECYSTECTOMY    . PACEMAKER GENERATOR CHANGE N/A 09/25/2014   BTK dual chamber pacemkaer implanted by Dr Rayann Heman  . PACEMAKER INSERTION  07/12/2006   St. Jude Victory XL  DR  -  Dr. Tami Ribas  . PARTIAL HYSTERECTOMY    . VENTRICULOPERITONEAL SHUNT  2003   Dr. Erline Levine   Family History  Problem Relation Age of Onset  . Cancer Mother   . Heart attack Father   . Hypertension Unknown    Social History   Socioeconomic History  . Marital status: Legally Separated    Spouse name: Not on file  . Number of children: Not on file  . Years of education: Not on file  . Highest education level: Not on file  Occupational History  . Not on file  Social Needs  . Financial resource strain: Not hard at all  . Food insecurity:    Worry: Never true    Inability: Never true  . Transportation needs:    Medical: No     Non-medical: No  Tobacco Use  . Smoking status: Former Smoker    Packs/day: 0.50    Years: 15.00    Pack years: 7.50    Types: Cigarettes  . Smokeless tobacco: Never Used  . Tobacco comment: quit for 2 weeks as of 05/13/2018, has the occasional cigarrette  Substance and Sexual Activity  . Alcohol use: No    Alcohol/week: 0.0 oz  . Drug use: No  . Sexual activity: Not Currently  Lifestyle  . Physical activity:    Days per week: 0 days    Minutes per session: 0 min  . Stress: Only a little  Relationships  . Social connections:    Talks on phone: More than three times a week    Gets together: More than three times a week    Attends religious service: Never    Active member of club or organization: No    Attends meetings of clubs or organizations: Never    Relationship status: Separated  Other Topics Concern  . Not on file  Social History Narrative  . Not on file    Outpatient Encounter Medications as of 05/13/2018  Medication Sig  . aspirin 81 MG tablet Take 81 mg by mouth daily.  Marland Kitchen atorvastatin (LIPITOR) 40 MG tablet Take 1 tablet (40 mg total) by mouth daily.  Marland Kitchen buPROPion (WELLBUTRIN SR) 150 MG 12 hr tablet TAKE 1 TABLET BY MOUTH TWICE DAILY  . clonazePAM (KLONOPIN) 1 MG tablet TAKE 1 TABLET BY MOUTH UP TO THREE TIMES DAILY AS NEEDED  . losartan (COZAAR) 100 MG tablet TAKE 1 TABLET(100 MG) BY MOUTH DAILY  . metFORMIN (GLUCOPHAGE) 500 MG tablet TAKE 1 TABLET BY MOUTH WITH BREAKFAST AND WITH SUPPER FOR DIABETES  . metoprolol tartrate (LOPRESSOR) 50 MG tablet TAKE 1 TABLET BY MOUTH TWICE DAILY  . pantoprazole (PROTONIX) 40 MG tablet TAKE 1 TABLET BY MOUTH DAILY TO REDUCE STOMACH ACID  . Tdap (BOOSTRIX) 5-2.5-18.5 LF-MCG/0.5 injection Inject 0.5 mLs into the muscle once for 1 dose.  . [DISCONTINUED] Tdap (BOOSTRIX) 5-2.5-18.5 LF-MCG/0.5 injection Inject 0.5 mLs into the muscle once.  Marland Kitchen buPROPion (WELLBUTRIN SR) 150 MG 12 hr tablet TAKE 1 TABLET BY MOUTH TWICE DAILY  .  Melatonin 5 MG TABS 1 nightly as needed for sleep   No facility-administered encounter medications on file as of 05/13/2018.     Activities of Daily Living In your present state of health, do you have any difficulty performing the following activities: 05/13/2018  Hearing? N  Vision? N  Difficulty concentrating or making decisions? Y  Walking or climbing stairs? N  Dressing or bathing? N  Doing errands, shopping? N  Preparing Food and eating ? N  Using the Toilet? N  In the past six months, have you accidently leaked urine? Y  Comment some leaking, other times retension  Do you have problems with loss of bowel control? N  Managing your Medications? N  Managing your Finances? N  Housekeeping or managing your Housekeeping? N  Some recent data might be hidden    Patient Care Team: [name], NP as PCP - General (Geriatric Medicine) Patsey Berthold, NP as Nurse Practitioner (Cardiology) Darleen Crocker, MD as Consulting Physician (Ophthalmology) Thompson Grayer, MD as Consulting Physician (Cardiology)    Assessment:   This is a routine wellness examination for [place].  Exercise Activities and Dietary recommendations Current Exercise Habits: The patient does not participate in regular exercise at present, Exercise limited by: None identified  Goals    None      Fall Risk Fall Risk  05/13/2018 07/11/2017 03/14/2016 08/18/2015 05/12/2015  Falls in the past year? Yes Yes Yes Yes Yes  Number falls in past yr: 2 or more 2 or more 2 or more 2 or more 2 or more  Comment - at 15 per son 6 times - -  Injury with Fall? No No No Yes No   Is the patient's home free of loose throw rugs in walkways, pet beds, electrical cords, etc?   yes      Grab bars in the bathroom? yes      Handrails on the stairs?   yes      Adequate lighting?   yes  Timed Get Up and Go performed: 20 seconds  Depression Screen PHQ 2/9 Scores 05/13/2018 07/11/2017 03/14/2016 01/21/2015  PHQ - 2 Score 2 6 6  0  PHQ- 9  Score 6 16 16  -     Cognitive Function MMSE - Mini Mental State Exam 05/13/2018 07/11/2017 03/14/2016  Orientation to time 5 3 4   Orientation to Place 5 4 4   Registration 3 3 3   Attention/ Calculation 5 5 5   Recall 2 2 2   Language- name 2 objects 2 2 2   Language- repeat 1 1 1   Language- follow 3 step command 3 3 3   Language- read & follow direction 1 1 1   Write a sentence 1 1 1   Copy design 1 1 1   Total score 29 26 27         Immunization History  Administered Date(s) Administered  . Influenza, High Dose Seasonal PF 07/11/2017  . Influenza,inj,Quad PF,6+ Mos 08/18/2015, 12/13/2016  . Pneumococcal Conjugate-13 08/18/2015  . Pneumococcal Polysaccharide-23 07/11/2017  . Zoster 03/16/2015  . Zoster Recombinat (Shingrix) 08/21/2017, 02/05/2018    Qualifies for Shingles Vaccine? Up to date, completed  Screening Tests Health Maintenance  Topic Date Due  . TETANUS/TDAP  04/27/1960  . FOOT EXAM  01/20/2016  . OPHTHALMOLOGY EXAM  08/13/2017  . HEMOGLOBIN A1C  05/08/2018  . INFLUENZA VACCINE  06/13/2018  . DEXA SCAN  Completed  . PNA vac Low Risk Adult  Completed    Cancer Screenings: Lung: Low Dose CT Chest recommended if Age 84-80 years, 30 pack-year currently smoking OR have quit w/in 15years. Patient does qualify. Breast:  Up to date on Mammogram? Yes   Up to date of Bone Density/Dexa? Yes Colorectal: up to date  Additional Screenings:  Hepatitis C Screening: declined Patient will make diabetic eye exam appointment    Plan:    I have personally reviewed and addressed the Medicare Annual Wellness questionnaire and have noted the following  in the patient's chart:  A. Medical and social history B. Use of alcohol, tobacco or illicit drugs  C. Current medications and supplements D. Functional ability and status E.  Nutritional status F.  Physical activity G. Advance directives H. List of other physicians I.  Hospitalizations, surgeries, and ER visits in previous 12  months J.  [place] to include hearing, vision, cognitive, depression L. Referrals and appointments - none  In addition, I have reviewed and discussed with patient certain preventive protocols, quality metrics, and best practice recommendations. A written personalized care plan for preventive services as well as general preventive health recommendations were provided to patient.  See attached scanned questionnaire for additional information.   Signed,   Tyson Dense, RN Nurse Health Advisor  Patient concerns: Head hurts intermittently from mild to severe pain

## 2018-05-13 NOTE — Patient Instructions (Signed)
To use Claritin by mouth daily for allergies Can also use flonase 1 spray into both nares twice daily for allergies  To follow up with ophthalmologist  Referral to neurosurgery placed  Recommended psychologist (therapist) to help with depression.   Routine mammogram recommended and home self exams monthly, prevention of dental and periodontal disease by routine follow up with dentist - heart healthy diabetic diet - regular sustained exercise for at least 30 minutes 5 times per week,  -good job on stopping smoking! Continue smoking cessation  Follow up on labs this week- make fasting lab appt for this week.

## 2018-05-13 NOTE — Progress Notes (Signed)
Provider: [name], NP  Patient Care Team: [name], NP as PCP - General (Geriatric Medicine) Patsey Berthold, NP as Nurse Practitioner (Cardiology) Darleen Crocker, MD as Consulting Physician (Ophthalmology) Thompson Grayer, MD as Consulting Physician (Cardiology)  Extended Emergency Contact Information Primary Emergency Contact: Stanley,Joseph Address: [address] [place] of [place] Phone: [phone] Relation: Son Secondary Emergency Contact: Brantley Fling [place] of [place] Phone: [phone] Relation: Daughter Allergies  Allergen Reactions  . Oxaprozin Rash   Code Status: FULL Goals of Care: Advanced Directive information Advanced Directives 05/13/2018  Does Patient Have a Medical Advance Directive? No  Does patient want to make changes to medical advance directive? -  Would patient like information on creating a medical advance directive? -     Chief Complaint  Patient presents with  . Medical Management of Chronic Issues    Pt is being seen for a physical.   . ACP    needed    HPI: Patient is a 77 y.o. female seen in today for an annual wellness exam.   Major illnesses or hospitalization in the last year-none Has gained weight since last visit. Eating more than she has in the past. Has stopped smoking.    Depression screen [facility] 2/9 05/13/2018 07/11/2017 03/14/2016 01/21/2015  Decreased Interest 1 3 3  0  Down, Depressed, Hopeless 1 3 3  0  PHQ - 2 Score 2 6 6  0  Altered sleeping 1 3 3  -  Tired, decreased energy 1 3 3  -  Change in appetite 1 0 1 -  Feeling bad or failure about yourself  0 3 3 -  Trouble concentrating 1 1 0 -  Moving slowly or fidgety/restless 0 0 0 -  Suicidal thoughts 0 0 0 -  PHQ-9 Score 6 16 16  -  Difficult doing work/chores Somewhat difficult Extremely dIfficult - -    Fall Risk  05/13/2018 07/11/2017 03/14/2016 08/18/2015 05/12/2015  Falls in the past year? Yes Yes Yes Yes Yes   Number falls in past yr: 2 or more 2 or more 2 or more 2 or more 2 or more  Comment - at 15 per son 6 times - -  Injury with Fall? No No No Yes No   MMSE - Mini Mental State Exam 05/13/2018 07/11/2017 03/14/2016  Orientation to time 5 3 4   Orientation to Place 5 4 4   Registration 3 3 3   Attention/ Calculation 5 5 5   Recall 2 2 2   Language- name 2 objects 2 2 2   Language- repeat 1 1 1   Language- follow 3 step command 3 3 3   Language- read & follow direction 1 1 1   Write a sentence 1 1 1   Copy design 1 1 1   Total score 29 26 27      Health Maintenance  Topic Date Due  . TETANUS/TDAP  04[birth year]27/1960  . FOOT EXAM  01/20/2016  . OPHTHALMOLOGY EXAM  08/13/2017  . HEMOGLOBIN A1C  05/08/2018  . INFLUENZA VACCINE  06/13/2018  . DEXA SCAN  Completed  . PNA vac Low Risk Adult  Completed   Routine visits to dentist and ophthalmologist.  Over due for follow up with neurosurgery. hx of cerebral aneurysm after SAH s/p coiling in 2003 and ventriculomegaly s/p V-P shunt.  Reports headache. She was having issues with balance but in the last year this has improved only 1 fall in the last year when  she tripped over a pad in the hallway.   Ongoing depression- sleeps a lot. Taking Wellbutrin twice daily, has not gone to therapy/counselling     Past Medical History:  Diagnosis Date  . Anxiety and depression    Hx of  . Blepharospasm    . Breast cancer ([place])    s/p lumpectomy  . Carotid bruit    Bilateral. Mild to moderate disease on LEFT and mild on the RIGHT in 2012  . COPD (chronic obstructive pulmonary disease) ([place])   . Diabetes mellitus without complication ([place])    AODM  . Fuchs' corneal dystrophy   . GERD (gastroesophageal reflux disease)    . Hyperlipidemia   . Hypertension    . Insomnia    . Intracerebral bleed ([place]) 2003   Remote intracerebral blled and coiling by Dr. Estanislado Pandy for cerebral aneurysm ans a shunt placed by Dr. Vertell Limber remotely. No seizure disorder and this happened in  2003 with an anterior communicating aneurysm.  Marland Kitchen Neurocardiogenic syncope    a. s/p Biotronik dual chamber pacemaker  . Personal history of radiation therapy   . Sinus node dysfunction (HCC)    . Tobacco abuse    . Type II or unspecified type diabetes mellitus without mention of complication, not stated as uncontrolled      Past Surgical History:  Procedure Laterality Date  . ANEURYSM COILING  2003   Cerebral aneurysm ;Dr. Luanne Bras  . BREAST LUMPECTOMY Right    For cancer, no recurrence  . CHOLECYSTECTOMY    . PACEMAKER GENERATOR CHANGE N/A 09/25/2014   BTK dual chamber pacemkaer implanted by Dr Rayann Heman  . PACEMAKER INSERTION  07/12/2006   St. Jude Victory XL DR  -  Dr. Tami Ribas  . PARTIAL HYSTERECTOMY    . VENTRICULOPERITONEAL SHUNT  2003   Dr. Erline Levine    Social History   Socioeconomic History  . Marital status: Legally Separated    Spouse name: Not on file  . Number of children: Not on file  . Years of education: Not on file  . Highest education level: Not on file  Occupational History  . Not on file  Social Needs  . Financial resource strain: Not hard at all  . Food insecurity:    Worry: Never true    Inability: Never true  . Transportation needs:    Medical: No    Non-medical: No  Tobacco Use  . Smoking status: Former Smoker    Packs/day: 0.50    Years: 15.00    Pack years: 7.50    Types: Cigarettes  . Smokeless tobacco: Never Used  . Tobacco comment: quit for 2 weeks as of 05/13/2018, has the occasional cigarrette  Substance and Sexual Activity  . Alcohol use: No    Alcohol/week: 0.0 oz  . Drug use: No  . Sexual activity: Not Currently  Lifestyle  . Physical activity:    Days per week: 0 days    Minutes per session: 0 min  . Stress: Only a little  Relationships  . Social connections:    Talks on phone: More than three times a week    Gets together: More than three times a week    Attends religious service: Never    Active member of club  or organization: No    Attends meetings of clubs or organizations: Never    Relationship status: Separated  Other Topics Concern  . Not on file  Social History Narrative  . Not on file  Family History  Problem Relation Age of Onset  . Cancer Mother   . Heart attack Father   . Hypertension Unknown     Review of Systems:  Review of Systems  Constitutional: Positive for appetite change (increased), fatigue and unexpected weight change (gain).  HENT: Positive for hearing loss, rhinorrhea and sinus pressure.        Dentures Does not wish to have hearing aids  Eyes: Positive for visual disturbance (Corrective lenses).       Xerophthalmia  Respiratory: Negative for cough and shortness of breath.   Cardiovascular: Positive for palpitations and leg swelling. Negative for chest pain.  Gastrointestinal: Negative for abdominal distention, abdominal pain, blood in stool, constipation and diarrhea.  Endocrine: Negative.        History of diabetes  Genitourinary: Positive for frequency. Negative for difficulty urinating.       Incontinence.  Musculoskeletal: Positive for arthralgias, gait problem and myalgias.       History of falls and gait instability. Chronic right flank discomfort.  Skin: Negative.   Allergic/Immunologic: Positive for environmental allergies.  Neurological: Positive for headaches. Negative for dizziness.       History of neurocardiogenic syncope. Memory disorder. History of intracerebral bleed in 2003. Subsequently had a small aneurysm treated by Dr. Estanislado Pandy with a coil. History of shunt to the brain in the left parietal area placed by Dr. Vertell Limber, neurosurgeon in 2003.  Hematological: Negative.   Psychiatric/Behavioral: Positive for confusion and sleep disturbance. Negative for decreased concentration and dysphoric mood.     Allergies as of 05/13/2018      Reactions   Oxaprozin Rash      Medication List        Accurate as of 05/13/18  2:13 PM. Always use  your most recent med list.          aspirin 81 MG tablet Take 81 mg by mouth daily.   atorvastatin 40 MG tablet Commonly known as:  LIPITOR Take 1 tablet (40 mg total) by mouth daily.   buPROPion 150 MG 12 hr tablet Commonly known as:  WELLBUTRIN SR TAKE 1 TABLET BY MOUTH TWICE DAILY   clonazePAM 1 MG tablet Commonly known as:  KLONOPIN TAKE 1 TABLET BY MOUTH UP TO THREE TIMES DAILY AS NEEDED   losartan 100 MG tablet Commonly known as:  COZAAR TAKE 1 TABLET(100 MG) BY MOUTH DAILY   Melatonin 5 MG Tabs 1 nightly as needed for sleep   metFORMIN 500 MG tablet Commonly known as:  GLUCOPHAGE TAKE 1 TABLET BY MOUTH WITH BREAKFAST AND WITH SUPPER FOR DIABETES   metoprolol tartrate 50 MG tablet Commonly known as:  LOPRESSOR TAKE 1 TABLET BY MOUTH TWICE DAILY   pantoprazole 40 MG tablet Commonly known as:  PROTONIX TAKE 1 TABLET BY MOUTH DAILY TO REDUCE STOMACH ACID   Tdap 5-2.5-18.5 LF-MCG/0.5 injection Commonly known as:  BOOSTRIX Inject 0.5 mLs into the muscle once for 1 dose.         Physical Exam: Vitals:   05/13/18 1309  BP: 132/80  Pulse: 91  Temp: 98.5 F (36.9 C)  TempSrc: Oral  SpO2: 95%  Weight: 183 lb (83 kg)  Height: 5\' 9"  (1.753 m)   Body mass index is 27.02 kg/m. Physical Exam  Constitutional: She is oriented to person, place, and time. She appears well-developed and well-nourished. No distress.  HENT:  Nose: Nose normal.  Mouth/Throat: Oropharynx is clear and moist. No oropharyngeal exudate.  Ventriculoperitoneal shunt. Upper dentures  Eyes: Pupils are equal, round, and reactive to light. Conjunctivae and EOM are normal. No scleral icterus.  Neck: Normal range of motion. Neck supple.  Cardiovascular: Normal rate, regular rhythm and normal heart sounds.  Pulmonary/Chest: Effort normal and breath sounds normal.  Pacemaker right upper chest  Abdominal: Soft. Bowel sounds are normal. She exhibits no distension and no mass. There is no  tenderness.  Musculoskeletal: Normal range of motion. She exhibits edema (+1 bilaterally). She exhibits no tenderness.  Neurological: She is alert and oriented to person, place, and time. No cranial nerve deficit. Coordination normal.  03/14/16 MMSE 28/30.Passed clock drawing. 07/11/16 MMSE 26/30. Passed clock  7/1/201 MMSE 29/30  Skin: Skin is warm and dry. No rash noted. She is not diaphoretic. No erythema. No pallor.  Psychiatric: She has a normal mood and affect.    Labs reviewed: Basic Metabolic Panel: Recent Labs    07/11/17 1046 11/07/17 1138 11/15/17 1110  NA 130* 130* 133*  K 4.5 5.5* 4.6  CL 94* 97* 99  CO2 26 25 28   GLUCOSE 108* 86 113*  BUN 20 13 17   CREATININE 1.05* 0.68 0.80  CALCIUM 9.1 9.1 8.9  TSH 2.08  --   --    Liver Function Tests: Recent Labs    07/11/17 1046 11/07/17 1138  AST 10 13  ALT 9 10  ALKPHOS 79  --   BILITOT 0.6 0.7  PROT 6.4 6.0*  ALBUMIN 4.2  --    No results for input(s): LIPASE, AMYLASE in the last 8760 hours. No results for input(s): AMMONIA in the last 8760 hours. CBC: Recent Labs    07/11/17 1046 11/07/17 1138  WBC 10.3 6.6  NEUTROABS 7,416 3,709  HGB 11.4* 11.9  HCT 33.4* 35.1  MCV 86.5 82.6  PLT 320 250   Lipid Panel: Recent Labs    07/11/17 1046  CHOL 95  HDL 47*  LDLCALC 40  TRIG 38  CHOLHDL 2.0   Lab Results  Component Value Date   HGBA1C 6.0 (H) 11/07/2017    Procedures: No results found.  Assessment/Plan 1. Osteoporosis, unspecified osteoporosis type, unspecified pathological fracture presence -noted on dexa from 2010, not currently on treatment.  Recommended to take caltrate with D 600/400 twice a day with weight bearing activity. - DG Bone Density; Future  2. Major depressive disorder with single episode, in partial remission ([place]) -still with depression however improved recently. Continues on Wellbutrin twice daily.  Encouraged therapy/counselling services as well.   3. Mixed  hyperlipidemia -continues on lipitor 40 mg daily. Diet modification encouraged as well. Not fasting today but will return this week for fasting blood work.  - COMPLETE METABOLIC PANEL WITH GFR; Future - Lipid Panel; Future  4. Essential hypertension Stable on current regimen, will continue current medication with lifestyle modifications.  - CBC with Differential/Platelets; Future  5. Diabetes mellitus without complication ([place]) Continues on metformin 500 mg by mouth twice daily - Hemoglobin A1c; Future  6. Blepharospasm -continues on clonazepam which is the only thing that she has found to give her relief.   7. Subarachnoid hemorrhage ([place]) -pt has not followed up with neurosurgery for several years. Had recommended in the past but they did not go.  - Ambulatory referral to Neurosurgery  8. Wellness examination The patient was counseled regarding the appropriate use of alcohol, regular self-examination of the breasts on a monthly basis, prevention of dental and periodontal disease, diet, regular sustained exercise for at least 30 minutes 5 times per week,  routine screening interval for mammogram as recommended by the [place] and ACOG,smoking cessation, tobacco use,  and recommended schedule for GI hemoccult testing, colonoscopy, cholesterol, thyroid and diabetes screening. To keep follow up with ophthalmologist and dentist.   9. Allergic rhinitis, unspecified seasonality, unspecified trigger To use Claritin by mouth daily for allergies Can also use flonase 1 spray into both nares twice daily for allergies  10. Weight gain contributes this to eating more due to quitting smoking. No significant edema, shortness of breath etc. - TSH; Future  Next appt: 3 months for routine follow up [place]. [place], [facility] [phone]

## 2018-05-14 ENCOUNTER — Ambulatory Visit: Payer: Medicare Other | Admitting: Nurse Practitioner

## 2018-05-22 ENCOUNTER — Other Ambulatory Visit: Payer: Medicare Other

## 2018-05-22 DIAGNOSIS — E119 Type 2 diabetes mellitus without complications: Secondary | ICD-10-CM | POA: Diagnosis not present

## 2018-05-22 DIAGNOSIS — E782 Mixed hyperlipidemia: Secondary | ICD-10-CM

## 2018-05-22 DIAGNOSIS — R635 Abnormal weight gain: Secondary | ICD-10-CM

## 2018-05-22 DIAGNOSIS — I10 Essential (primary) hypertension: Secondary | ICD-10-CM

## 2018-05-23 LAB — COMPLETE METABOLIC PANEL WITH GFR
AG Ratio: 2.1 (calc) (ref 1.0–2.5)
ALT: 11 U/L (ref 6–29)
AST: 10 U/L (ref 10–35)
Albumin: 4.1 g/dL (ref 3.6–5.1)
Alkaline phosphatase (APISO): 75 U/L (ref 33–130)
BUN/Creatinine Ratio: 20 (calc) (ref 6–22)
BUN: 21 mg/dL (ref 7–25)
CO2: 28 mmol/L (ref 20–32)
Calcium: 9.2 mg/dL (ref 8.6–10.4)
Chloride: 103 mmol/L (ref 98–110)
Creat: 1.05 mg/dL — ABNORMAL HIGH (ref 0.60–0.93)
GFR, Est African American: 59 mL/min/{1.73_m2} — ABNORMAL LOW (ref >=60)
GFR, Est Non African American: 51 mL/min/{1.73_m2} — ABNORMAL LOW (ref >=60)
Globulin: 2 g/dL (calc) (ref 1.9–3.7)
Glucose, Bld: 118 mg/dL — ABNORMAL HIGH (ref 65–99)
Potassium: 4.9 mmol/L (ref 3.5–5.3)
Sodium: 137 mmol/L (ref 135–146)
Total Bilirubin: 0.7 mg/dL (ref 0.2–1.2)
Total Protein: 6.1 g/dL (ref 6.1–8.1)

## 2018-05-23 LAB — CBC WITH DIFFERENTIAL/PLATELET
Basophils Absolute: 80 cells/uL (ref 0–200)
Basophils Relative: 1.2 %
Eosinophils Absolute: 141 cells/uL (ref 15–500)
Eosinophils Relative: 2.1 %
HCT: 34.9 % — ABNORMAL LOW (ref 35.0–45.0)
Hemoglobin: 11.6 g/dL — ABNORMAL LOW (ref 11.7–15.5)
Lymphs Abs: 2479 cells/uL (ref 850–3900)
MCH: 28.4 pg (ref 27.0–33.0)
MCHC: 33.2 g/dL (ref 32.0–36.0)
MCV: 85.3 fL (ref 80.0–100.0)
MPV: 10.1 fL (ref 7.5–12.5)
Monocytes Relative: 8.6 %
Neutro Abs: 3424 cells/uL (ref 1500–7800)
Neutrophils Relative %: 51.1 %
Platelets: 257 10*3/uL (ref 140–400)
RBC: 4.09 10*6/uL (ref 3.80–5.10)
RDW: 13.2 % (ref 11.0–15.0)
Total Lymphocyte: 37 %
WBC mixed population: 576 cells/uL (ref 200–950)
WBC: 6.7 10*3/uL (ref 3.8–10.8)

## 2018-05-23 LAB — HEMOGLOBIN A1C
Hgb A1c MFr Bld: 6.1 % of total Hgb — ABNORMAL HIGH (ref <5.7)
Mean Plasma Glucose: 128 (calc)
eAG (mmol/L): 7.1 (calc)

## 2018-05-23 LAB — TSH: TSH: 4.95 mIU/L — ABNORMAL HIGH (ref 0.40–4.50)

## 2018-05-23 LAB — LIPID PANEL
Cholesterol: 98 mg/dL (ref <200)
HDL: 48 mg/dL — ABNORMAL LOW (ref >50)
LDL Cholesterol (Calc): 36 mg/dL (calc)
Non-HDL Cholesterol (Calc): 50 mg/dL (calc) (ref <130)
Total CHOL/HDL Ratio: 2 (calc) (ref <5.0)
Triglycerides: 59 mg/dL (ref <150)

## 2018-06-01 ENCOUNTER — Emergency Department (HOSPITAL_COMMUNITY)
Admission: EM | Admit: 2018-06-01 | Discharge: 2018-06-01 | Disposition: A | Discharge status: Home / Self Care | Payer: Medicare Other | Attending: Emergency Medicine | Admitting: Emergency Medicine

## 2018-06-01 ENCOUNTER — Other Ambulatory Visit: Payer: Self-pay

## 2018-06-01 ENCOUNTER — Emergency Department (HOSPITAL_COMMUNITY): Payer: Medicare Other

## 2018-06-01 ENCOUNTER — Encounter (HOSPITAL_COMMUNITY): Payer: Self-pay | Admitting: *Deleted

## 2018-06-01 DIAGNOSIS — Z87891 Personal history of nicotine dependence: Secondary | ICD-10-CM | POA: Diagnosis not present

## 2018-06-01 DIAGNOSIS — R0789 Other chest pain: Secondary | ICD-10-CM

## 2018-06-01 DIAGNOSIS — Z950 Presence of cardiac pacemaker: Secondary | ICD-10-CM | POA: Insufficient documentation

## 2018-06-01 DIAGNOSIS — R0602 Shortness of breath: Secondary | ICD-10-CM | POA: Diagnosis not present

## 2018-06-01 DIAGNOSIS — Z853 Personal history of malignant neoplasm of breast: Secondary | ICD-10-CM | POA: Diagnosis not present

## 2018-06-01 DIAGNOSIS — Z7984 Long term (current) use of oral hypoglycemic drugs: Secondary | ICD-10-CM | POA: Insufficient documentation

## 2018-06-01 DIAGNOSIS — E119 Type 2 diabetes mellitus without complications: Secondary | ICD-10-CM | POA: Insufficient documentation

## 2018-06-01 DIAGNOSIS — I10 Essential (primary) hypertension: Secondary | ICD-10-CM | POA: Insufficient documentation

## 2018-06-01 DIAGNOSIS — Z7982 Long term (current) use of aspirin: Secondary | ICD-10-CM | POA: Diagnosis not present

## 2018-06-01 DIAGNOSIS — K219 Gastro-esophageal reflux disease without esophagitis: Secondary | ICD-10-CM

## 2018-06-01 DIAGNOSIS — J449 Chronic obstructive pulmonary disease, unspecified: Secondary | ICD-10-CM | POA: Insufficient documentation

## 2018-06-01 DIAGNOSIS — R079 Chest pain, unspecified: Secondary | ICD-10-CM | POA: Diagnosis not present

## 2018-06-01 LAB — BASIC METABOLIC PANEL
Anion gap: 8 (ref 5–15)
BUN: 17 mg/dL (ref 8–23)
CO2: 26 mmol/L (ref 22–32)
Calcium: 8.7 mg/dL — ABNORMAL LOW (ref 8.9–10.3)
Chloride: 103 mmol/L (ref 98–111)
Creatinine, Ser: 0.93 mg/dL (ref 0.44–1.00)
GFR calc Af Amer: >60 mL/min (ref >60)
GFR calc non Af Amer: 58 mL/min — ABNORMAL LOW (ref >60)
Glucose, Bld: 127 mg/dL — ABNORMAL HIGH (ref 70–99)
Potassium: 4.1 mmol/L (ref 3.5–5.1)
Sodium: 137 mmol/L (ref 135–145)

## 2018-06-01 LAB — CBC
HCT: 34.8 % — ABNORMAL LOW (ref 36.0–46.0)
Hemoglobin: 11.1 g/dL — ABNORMAL LOW (ref 12.0–15.0)
MCH: 28.2 pg (ref 26.0–34.0)
MCHC: 31.9 g/dL (ref 30.0–36.0)
MCV: 88.5 fL (ref 78.0–100.0)
Platelets: 240 10*3/uL (ref 150–400)
RBC: 3.93 MIL/uL (ref 3.87–5.11)
RDW: 14.6 % (ref 11.5–15.5)
WBC: 12.3 10*3/uL — ABNORMAL HIGH (ref 4.0–10.5)

## 2018-06-01 LAB — I-STAT TROPONIN, ED
Troponin i, poc: 0 ng/mL (ref 0.00–0.08)
Troponin i, poc: 0 ng/mL (ref 0.00–0.08)

## 2018-06-01 MED ORDER — PANTOPRAZOLE SODIUM 40 MG PO TBEC: 40.0000 mg | DELAYED_RELEASE_TABLET | Freq: Every day | ORAL | 0 refills | Status: DC

## 2018-06-01 MED ORDER — GI COCKTAIL ~~LOC~~
30.0000 mL | Freq: Once | ORAL | Status: DC
  Filled 2018-06-01: qty 30

## 2018-06-01 NOTE — Discharge Instructions (Signed)
Increase Protonix to twice a day for 1 week.

## 2018-06-01 NOTE — ED Provider Notes (Signed)
[facility] EMERGENCY DEPARTMENT Provider Note   CSN: [id number] Arrival date & time: 06/01/18  0175     History   Chief Complaint Chief Complaint  Patient presents with  . Chest Pain    HPI Mary Higgins is a 77 y.o. female.  Pt presents to the ED today with CP.  The pt said it woke her from sleep this morning.  If feels like indigestion.  She took pepto bismol which did not help.  EMS gave her 3 nitro and 324 mg asa.  No help.  No sob.  No f/c.  Pt said that for the last few days, she food has been coming back up into her mouth.     Past Medical History:  Diagnosis Date  . Anxiety and depression    Hx of  . Blepharospasm    . Breast cancer ([place])    s/p lumpectomy  . Carotid bruit    Bilateral. Mild to moderate disease on LEFT and mild on the RIGHT in 2012  . COPD (chronic obstructive pulmonary disease) ([place])   . Diabetes mellitus without complication ([place])    AODM  . Fuchs' corneal dystrophy   . GERD (gastroesophageal reflux disease)    . Hyperlipidemia   . Hypertension    . Insomnia    . Intracerebral bleed ([place]) 2003   Remote intracerebral blled and coiling by Dr. Estanislado Pandy for cerebral aneurysm ans a shunt placed by Dr. Vertell Limber remotely. No seizure disorder and this happened in 2003 with an anterior communicating aneurysm.  Marland Kitchen Neurocardiogenic syncope    a. s/p Biotronik dual chamber pacemaker  . Personal history of radiation therapy   . Sinus node dysfunction (HCC)    . Tobacco abuse    . Type II or unspecified type diabetes mellitus without mention of complication, not stated as uncontrolled      Patient Active Problem List   Diagnosis Date Noted  . Hyponatremia 02/07/2017  . Diarrhea 12/13/2016  . Actinic keratosis 12/13/2016  . Falls 12/13/2016  . Cervicalgia 08/18/2015  . Onychomycosis 01/20/2015  . Hyperlipidemia 01/20/2015  . Syncope and collapse 09/10/2014  . Insomnia 03/18/2014  . Depression due to head injury 03/18/2014    . Flank pain 03/18/2014  . Blepharospasm 03/18/2014  . GERD (gastroesophageal reflux disease) 03/18/2014  . Hypertension 03/18/2014  . DM type 2 (diabetes mellitus, type 2) ([place]) 03/18/2014  . Breast mass 03/18/2014  . Abnormality of gait 03/18/2014  . History of fall 03/18/2014  . Other malaise and fatigue 03/18/2014  . Major depressive disorder with single episode 03/18/2014  . Sinus node dysfunction ([place]) 12/21/2013  . Tobacco abuse 12/21/2013  . Edema 12/21/2013  . Neurocardiogenic syncope   . Subarachnoid hemorrhage ([place]) 06/01/2002    Past Surgical History:  Procedure Laterality Date  . ANEURYSM COILING  2003   Cerebral aneurysm ;Dr. Luanne Bras  . BREAST LUMPECTOMY Right    For cancer, no recurrence  . CHOLECYSTECTOMY    . PACEMAKER GENERATOR CHANGE N/A 09/25/2014   BTK dual chamber pacemkaer implanted by Dr Rayann Heman  . PACEMAKER INSERTION  07/12/2006   St. Jude Victory XL DR  -  Dr. Tami Ribas  . PARTIAL HYSTERECTOMY    . VENTRICULOPERITONEAL SHUNT  2003   Dr. Erline Levine     OB History   None      Home Medications    Prior to Admission medications   Medication Sig Start Date End Date Taking? Authorizing Provider  aspirin  81 MG tablet Take 81 mg by mouth daily.    [provider]  atorvastatin (LIPITOR) 40 MG tablet Take 1 tablet (40 mg total) by mouth daily. 01/08/18   Jettie Booze, MD  buPROPion [facility] SR) 150 MG 12 hr tablet TAKE 1 TABLET BY MOUTH TWICE DAILY 05/06/18   [name], NP  clonazePAM (KLONOPIN) 1 MG tablet TAKE 1 TABLET BY MOUTH UP TO THREE TIMES DAILY AS NEEDED 05/07/18   [name], NP  losartan (COZAAR) 100 MG tablet TAKE 1 TABLET(100 MG) BY MOUTH DAILY 04/05/18   [name], NP  Melatonin 5 MG TABS 1 nightly as needed for sleep 12/13/16   Estill Dooms, MD  metFORMIN (GLUCOPHAGE) 500 MG tablet TAKE 1 TABLET BY MOUTH WITH BREAKFAST AND WITH SUPPER FOR DIABETES 05/07/18   [name], NP   metoprolol tartrate (LOPRESSOR) 50 MG tablet TAKE 1 TABLET BY MOUTH TWICE DAILY 04/23/17   Allred, Jeneen Rinks, MD  pantoprazole (PROTONIX) 40 MG tablet Take 1 tablet (40 mg total) by mouth daily. For the next week, take twice a day. 06/01/18   Isla Pence, MD    Family History Family History  Problem Relation Age of Onset  . Cancer Mother   . Heart attack Father   . Hypertension Unknown     Social History Social History   Tobacco Use  . Smoking status: Former Smoker    Packs/day: 0.50    Years: 15.00    Pack years: 7.50    Types: Cigarettes  . Smokeless tobacco: Never Used  . Tobacco comment: quit for 2 weeks as of 05/13/2018, has the occasional cigarrette  Substance Use Topics  . Alcohol use: No    Alcohol/week: 0.0 oz  . Drug use: No     Allergies   Oxaprozin   Review of Systems Review of Systems  Cardiovascular: Positive for chest pain.  All other systems reviewed and are negative.    Physical Exam Updated Vital Signs BP (!) 149/72   Pulse 85   Temp 97.8 F (36.6 C) (Oral)   Resp 19   Ht 5\' 9"  (1.753 m)   Wt 83 kg (183 lb)   LMP  (LMP Unknown)   SpO2 98%   BMI 27.02 kg/m   Physical Exam  Constitutional: She is oriented to person, place, and time. She appears well-developed and well-nourished.  HENT:  Head: Normocephalic and atraumatic.  Eyes: Pupils are equal, round, and reactive to light. EOM are normal.  Neck: Normal range of motion. Neck supple.  Cardiovascular: Normal rate, regular rhythm, intact distal pulses and normal pulses.  Pulmonary/Chest: Effort normal and breath sounds normal.  Abdominal: Soft. Bowel sounds are normal.  Musculoskeletal: Normal range of motion.       Right lower leg: Normal.       Left lower leg: Normal.  Neurological: She is alert and oriented to person, place, and time.  Skin: Skin is warm and dry. Capillary refill takes less than 2 seconds.  Psychiatric: She has a normal mood and affect. Her behavior is normal.   Nursing note and vitals reviewed.    ED Treatments / Results  Labs (all labs ordered are listed, but only abnormal results are displayed) Labs Reviewed  BASIC METABOLIC PANEL - Abnormal; Notable for the following components:      Result Value   Glucose, Bld 127 (*)    Calcium 8.7 (*)    GFR calc non Af Amer 58 (*)  All other components within normal limits  CBC - Abnormal; Notable for the following components:   WBC 12.3 (*)    Hemoglobin 11.1 (*)    HCT 34.8 (*)    All other components within normal limits  I-STAT TROPONIN, ED  I-STAT TROPONIN, ED    EKG EKG Interpretation  Date/Time:  Saturday June 01 2018 08:09:17 EDT Ventricular Rate:  84 PR Interval:    QRS Duration: 91 QT Interval:  376 QTC Calculation: 445 R Axis:   42 Text Interpretation:  Sinus rhythm Abnormal R-wave progression, early transition No significant change since last tracing Confirmed by Isla Pence [phone]) on 06/01/2018 8:14:55 AM Also confirmed by Isla Pence [phone]), editor Lynder Parents [phone])  on 06/01/2018 10:31:18 AM   Radiology Dg Chest 2 View  Result Date: 06/01/2018 CLINICAL DATA:  Chest pain. EXAM: CHEST - 2 VIEW COMPARISON:  05/27/2007 FINDINGS: A right subclavian approach pacemaker has been replaced in the interim. Leads terminate over the right atrium and right ventricle. The cardiomediastinal silhouette is within normal limits. Aortic atherosclerosis is noted. Surgical clips are present in the axilla bilaterally. No airspace consolidation, edema, pleural effusion, pneumothorax is identified. A VP shunt catheter courses along the anterior left chest wall. No acute osseous abnormality is seen. IMPRESSION: No active cardiopulmonary disease. Electronically Signed   By: Logan Bores M.D.   On: 06/01/2018 09:07    Procedures Procedures (including critical care time)  Medications Ordered in ED Medications  gi cocktail (Maalox,Lidocaine,Donnatal) (has no administration in time  range)     Initial Impression / Assessment and Plan / ED Course  I have reviewed the triage vital signs and the nursing notes.  Pertinent labs & imaging results that were available during my care of the patient were reviewed by me and considered in my medical decision making (see chart for details).    Pt is feeling much better after GI cocktail.  Sx atypical for cad, likely GERD.  I will increase protonix to bid for the next week.  Return if worse.  Final Clinical Impressions(s) / ED Diagnoses   Final diagnoses:  Atypical chest pain    ED Discharge Orders        Ordered    pantoprazole (PROTONIX) 40 MG tablet  Daily     06/01/18 1130       Isla Pence, MD 06/01/18 1132

## 2018-06-01 NOTE — ED Notes (Signed)
Patient able to ambulate independently  

## 2018-06-01 NOTE — ED Notes (Signed)
Pt ambulated to restroom no difficulties, pt back to room on tele.

## 2018-06-01 NOTE — ED Triage Notes (Signed)
EMS gave 3 NGT and 324 ASA prior to arrival to ED

## 2018-06-01 NOTE — ED Notes (Signed)
ED Provider at bedside. 

## 2018-06-04 ENCOUNTER — Other Ambulatory Visit: Payer: Self-pay | Admitting: Nurse Practitioner

## 2018-06-04 DIAGNOSIS — F324 Major depressive disorder, single episode, in partial remission: Secondary | ICD-10-CM

## 2018-06-05 ENCOUNTER — Other Ambulatory Visit: Payer: Self-pay | Admitting: Nurse Practitioner

## 2018-06-05 ENCOUNTER — Ambulatory Visit (INDEPENDENT_AMBULATORY_CARE_PROVIDER_SITE_OTHER): Payer: Medicare Other | Admitting: *Deleted

## 2018-06-05 DIAGNOSIS — I495 Sick sinus syndrome: Secondary | ICD-10-CM

## 2018-06-05 DIAGNOSIS — R55 Syncope and collapse: Secondary | ICD-10-CM | POA: Diagnosis not present

## 2018-06-05 DIAGNOSIS — G245 Blepharospasm: Secondary | ICD-10-CM

## 2018-06-05 DIAGNOSIS — I10 Essential (primary) hypertension: Secondary | ICD-10-CM

## 2018-06-05 NOTE — Progress Notes (Signed)
Remote pacemaker transmission.   

## 2018-06-05 NOTE — Telephone Encounter (Signed)
A medication refill was received from pharmacy for clonazepam 1mg. Rx was called in to pharmacy after verifying last fill date, provider, and quantity on [place] database.  

## 2018-06-10 ENCOUNTER — Other Ambulatory Visit: Payer: Self-pay | Admitting: Nurse Practitioner

## 2018-06-10 DIAGNOSIS — E119 Type 2 diabetes mellitus without complications: Secondary | ICD-10-CM

## 2018-06-27 LAB — CUP PACEART REMOTE DEVICE CHECK
Date Time Interrogation Session: 20190815110613
Implantable Lead Implant Date: 20070830
Implantable Lead Implant Date: 20070830
Implantable Lead Location: 753859
Implantable Lead Location: 753860
Implantable Pulse Generator Implant Date: 20151113
Pulse Gen Model: 394931
Pulse Gen Serial Number: 68408118

## 2018-06-28 ENCOUNTER — Other Ambulatory Visit: Payer: Self-pay | Admitting: Nurse Practitioner

## 2018-06-28 DIAGNOSIS — I10 Essential (primary) hypertension: Secondary | ICD-10-CM

## 2018-07-05 ENCOUNTER — Other Ambulatory Visit: Payer: Medicare Other

## 2018-07-05 ENCOUNTER — Other Ambulatory Visit: Payer: Self-pay | Admitting: Nurse Practitioner

## 2018-07-05 DIAGNOSIS — G245 Blepharospasm: Secondary | ICD-10-CM

## 2018-07-05 NOTE — Telephone Encounter (Signed)
[place] Database verified and compliance confirmed   

## 2018-07-11 DIAGNOSIS — I609 Nontraumatic subarachnoid hemorrhage, unspecified: Secondary | ICD-10-CM | POA: Diagnosis not present

## 2018-08-05 ENCOUNTER — Other Ambulatory Visit: Payer: Self-pay | Admitting: Nurse Practitioner

## 2018-08-05 DIAGNOSIS — G245 Blepharospasm: Secondary | ICD-10-CM

## 2018-08-06 NOTE — Telephone Encounter (Signed)
A medication refill was received from pharmacy for clonazepam 1mg. Rx was called in to pharmacy after verifying last fill date, provider, and quantity on [place] database.  

## 2018-08-20 ENCOUNTER — Ambulatory Visit: Payer: Medicare Other | Admitting: Nurse Practitioner

## 2018-08-22 ENCOUNTER — Encounter: Payer: Medicare Other | Admitting: Internal Medicine

## 2018-08-28 ENCOUNTER — Other Ambulatory Visit: Payer: Self-pay | Admitting: Nurse Practitioner

## 2018-08-28 DIAGNOSIS — G245 Blepharospasm: Secondary | ICD-10-CM

## 2018-09-02 ENCOUNTER — Other Ambulatory Visit: Payer: Self-pay | Admitting: Nurse Practitioner

## 2018-09-02 DIAGNOSIS — F324 Major depressive disorder, single episode, in partial remission: Secondary | ICD-10-CM

## 2018-09-03 ENCOUNTER — Other Ambulatory Visit: Payer: Self-pay | Admitting: Nurse Practitioner

## 2018-09-03 DIAGNOSIS — G245 Blepharospasm: Secondary | ICD-10-CM

## 2018-09-03 NOTE — Telephone Encounter (Signed)
A medication refill was received from pharmacy for clonazepam 1mg. Rx was called in to pharmacy after verifying last fill date, provider, and quantity on [place] database.  

## 2018-09-04 ENCOUNTER — Ambulatory Visit (INDEPENDENT_AMBULATORY_CARE_PROVIDER_SITE_OTHER): Payer: Medicare Other | Admitting: *Deleted

## 2018-09-04 DIAGNOSIS — I495 Sick sinus syndrome: Secondary | ICD-10-CM | POA: Diagnosis not present

## 2018-09-04 NOTE — Progress Notes (Signed)
Remote pacemaker transmission.   

## 2018-09-17 ENCOUNTER — Other Ambulatory Visit: Payer: Self-pay | Admitting: Nurse Practitioner

## 2018-09-23 NOTE — Progress Notes (Deleted)
Electrophysiology Office Note Date: 09/23/2018  ID:  Mary, Higgins [birth year], MRN [id number]  PCP: [name], NP Primary Cardiologist: previously Dr Rollene Fare Electrophysiologist: [place]  CC: Pacemaker follow-up  Mary Higgins is a 77 y.o. female seen today for Dr Rayann Heman.  She presents today for routine electrophysiology followup.  Since last being seen in our clinic, the patient reports doing very well.  She denies chest pain, palpitations, dyspnea, PND, orthopnea, nausea, vomiting, dizziness, syncope, edema, weight gain, or early satiety.  Device History: STJ dual chamber PPM implanted 2007 for neurocardiogenic syncope; generator change to Biotronik dual chamber pacemaker 09/2014 Dr Rayann Heman  Past Medical History:  Diagnosis Date  . Anxiety and depression    Hx of  . Blepharospasm    . Breast cancer ([place])    s/p lumpectomy  . Carotid bruit    Bilateral. Mild to moderate disease on LEFT and mild on the RIGHT in 2012  . COPD (chronic obstructive pulmonary disease) ([place])   . Diabetes mellitus without complication ([place])    AODM  . Fuchs' corneal dystrophy   . GERD (gastroesophageal reflux disease)    . Hyperlipidemia   . Hypertension    . Insomnia    . Intracerebral bleed ([place]) 2003   Remote intracerebral blled and coiling by Dr. Estanislado Pandy for cerebral aneurysm ans a shunt placed by Dr. Vertell Limber remotely. No seizure disorder and this happened in 2003 with an anterior communicating aneurysm.  Marland Kitchen Neurocardiogenic syncope    a. s/p Biotronik dual chamber pacemaker  . Personal history of radiation therapy   . Sinus node dysfunction (HCC)    . Tobacco abuse    . Type II or unspecified type diabetes mellitus without mention of complication, not stated as uncontrolled     Past Surgical History:  Procedure Laterality Date  . ANEURYSM COILING  2003   Cerebral aneurysm ;Dr. Luanne Bras  . BREAST LUMPECTOMY Right    For cancer, no recurrence  .  CHOLECYSTECTOMY    . PACEMAKER GENERATOR CHANGE N/A 09/25/2014   BTK dual chamber pacemkaer implanted by Dr Rayann Heman  . PACEMAKER INSERTION  07/12/2006   St. Jude Victory XL DR  -  Dr. Tami Ribas  . PARTIAL HYSTERECTOMY    . VENTRICULOPERITONEAL SHUNT  2003   Dr. Erline Levine    Current Outpatient Medications  Medication Sig Dispense Refill  . aspirin 81 MG tablet Take 81 mg by mouth daily.    Marland Kitchen atorvastatin (LIPITOR) 40 MG tablet Take 1 tablet (40 mg total) by mouth daily. 90 tablet 2  . buPROPion (WELLBUTRIN SR) 150 MG 12 hr tablet TAKE 1 TABLET BY MOUTH TWICE DAILY. APPOINTMENT DUE 60 tablet 0  . clonazePAM (KLONOPIN) 1 MG tablet TAKE 1 TABLET BY MOUTH THREE TIMES DAILY AS NEEDED 90 tablet 0  . losartan (COZAAR) 100 MG tablet TAKE 1 TABLET BY MOUTH DAILY 30 tablet 6  . Melatonin 5 MG TABS 1 nightly as needed for sleep 30 tablet 5  . metFORMIN (GLUCOPHAGE) 500 MG tablet TAKE 1 TABLET BY MOUTH WITH BREAKFAST AND WITH SUPPER FOR DIABETES 180 tablet 1  . metoprolol tartrate (LOPRESSOR) 50 MG tablet TAKE 1 TABLET BY MOUTH TWICE DAILY 60 tablet 0  . pantoprazole (PROTONIX) 40 MG tablet TAKE 1 TABLET BY MOUTH EVERY DAY X 7 DAYS, THEN TAKE 1 TABLET BY MOUTH TWICE DAILY 90 tablet 0   No current facility-administered medications for this visit.     Allergies:   Oxaprozin  Social History: Social History   Socioeconomic History  . Marital status: Legally Separated    Spouse name: Not on file  . Number of children: Not on file  . Years of education: Not on file  . Highest education level: Not on file  Occupational History  . Not on file  Social Needs  . Financial resource strain: Not hard at all  . Food insecurity:    Worry: Never true    Inability: Never true  . Transportation needs:    Medical: No    Non-medical: No  Tobacco Use  . Smoking status: Former Smoker    Packs/day: 0.50    Years: 15.00    Pack years: 7.50    Types: Cigarettes  . Smokeless tobacco: Never Used  .  Tobacco comment: quit for 2 weeks as of 05/13/2018, has the occasional cigarrette  Substance and Sexual Activity  . Alcohol use: No    Alcohol/week: 0.0 standard drinks  . Drug use: No  . Sexual activity: Not Currently  Lifestyle  . Physical activity:    Days per week: 0 days    Minutes per session: 0 min  . Stress: Only a little  Relationships  . Social connections:    Talks on phone: More than three times a week    Gets together: More than three times a week    Attends religious service: Never    Active member of club or organization: No    Attends meetings of clubs or organizations: Never    Relationship status: Separated  . Intimate partner violence:    Fear of current or ex partner: No    Emotionally abused: No    Physically abused: No    Forced sexual activity: No  Other Topics Concern  . Not on file  Social History Narrative  . Not on file    Family History: Family History  Problem Relation Age of Onset  . Cancer Mother   . Heart attack Father   . Hypertension Unknown      Review of Systems: All other systems reviewed and are otherwise negative except as noted above.   Physical Exam: VS:  LMP  (LMP Unknown)  , BMI There is no height or weight on file to calculate BMI.  GEN- The patient is well appearing, alert and oriented x 3 today.   HEENT: normocephalic, atraumatic; sclera clear, conjunctiva pink; hearing intact; oropharynx clear; neck supple  Lungs- Clear to ausculation bilaterally, normal work of breathing.  No wheezes, rales, rhonchi Heart- Regular rate and rhythm, no murmurs, rubs or gallops  GI- soft, non-tender, non-distended, bowel sounds present  Extremities- no clubbing, cyanosis, or edema  MS- no significant deformity or atrophy Skin- warm and dry, no rash or lesion; PPM pocket well healed Psych- euthymic mood, full affect Neuro- strength and sensation are intact  PPM Interrogation- reviewed in detail today,  See PACEART report  EKG:  EKG  is not ordered today.  Recent Labs: 05/22/2018: ALT 11; TSH 4.95 06/01/2018: BUN 17; Creatinine, Ser 0.93; Hemoglobin 11.1; Platelets 240; Potassium 4.1; Sodium 137   Wt Readings from Last 3 Encounters:  06/01/18 183 lb (83 kg)  05/13/18 183 lb (83 kg)  05/13/18 183 lb (83 kg)     Assessment and Plan:  1.  Neurocardiogenic syncope/sinus node dysfunctyion Normal PPM function See Pace Art report No changes today Encouraged adequate hydration  2.  HTN Stable No change required today   Current medicines are reviewed at length  with the patient today.   The patient does not have concerns regarding her medicines.  The following changes were made today:  none  Labs/ tests ordered today include: none No orders of the defined types were placed in this encounter.    Disposition:   Follow up with home monitoring, Dr Rayann Heman 1 year   Signed, Chanetta Marshall, NP 09/23/2018 12:45 PM  [place] [address] [phone] (office) [phone] (fax)

## 2018-09-24 ENCOUNTER — Encounter: Payer: Medicare Other | Admitting: Nurse Practitioner

## 2018-10-01 ENCOUNTER — Ambulatory Visit (INDEPENDENT_AMBULATORY_CARE_PROVIDER_SITE_OTHER): Payer: Medicare Other | Admitting: Nurse Practitioner

## 2018-10-01 ENCOUNTER — Encounter: Payer: Self-pay | Admitting: Nurse Practitioner

## 2018-10-01 VITALS — BP 134/82 | HR 91 | Temp 97.6°F | Ht 69.0 in | Wt 189.4 lb

## 2018-10-01 DIAGNOSIS — Z1211 Encounter for screening for malignant neoplasm of colon: Secondary | ICD-10-CM

## 2018-10-01 DIAGNOSIS — E782 Mixed hyperlipidemia: Secondary | ICD-10-CM

## 2018-10-01 DIAGNOSIS — R7989 Other specified abnormal findings of blood chemistry: Secondary | ICD-10-CM

## 2018-10-01 DIAGNOSIS — Z23 Encounter for immunization: Secondary | ICD-10-CM

## 2018-10-01 DIAGNOSIS — E119 Type 2 diabetes mellitus without complications: Secondary | ICD-10-CM

## 2018-10-01 DIAGNOSIS — E663 Overweight: Secondary | ICD-10-CM

## 2018-10-01 DIAGNOSIS — Z720 Tobacco use: Secondary | ICD-10-CM

## 2018-10-01 DIAGNOSIS — F324 Major depressive disorder, single episode, in partial remission: Secondary | ICD-10-CM | POA: Diagnosis not present

## 2018-10-01 DIAGNOSIS — I609 Nontraumatic subarachnoid hemorrhage, unspecified: Secondary | ICD-10-CM

## 2018-10-01 DIAGNOSIS — Z6827 Body mass index (BMI) 27.0-27.9, adult: Secondary | ICD-10-CM

## 2018-10-01 DIAGNOSIS — Z1212 Encounter for screening for malignant neoplasm of rectum: Secondary | ICD-10-CM

## 2018-10-01 DIAGNOSIS — G245 Blepharospasm: Secondary | ICD-10-CM

## 2018-10-01 DIAGNOSIS — M810 Age-related osteoporosis without current pathological fracture: Secondary | ICD-10-CM | POA: Diagnosis not present

## 2018-10-01 DIAGNOSIS — I10 Essential (primary) hypertension: Secondary | ICD-10-CM | POA: Diagnosis not present

## 2018-10-01 NOTE — Progress Notes (Signed)
Careteam: Patient Care Team: [name], NP as PCP - General (Geriatric Medicine) Patsey Berthold, NP as Nurse Practitioner (Cardiology) Darleen Crocker, MD as Consulting Physician (Ophthalmology) Thompson Grayer, MD as Consulting Physician (Cardiology)  Advanced Directive information Does Patient Have a Medical Advance Directive?: No, Would patient like information on creating a medical advance directive?: Yes (Inpatient - patient requests chaplain consult to create a medical advance directive)  Allergies  Allergen Reactions  . Oxaprozin Rash    Chief Complaint  Patient presents with  . Medical Management of Chronic Issues    folow up, audit c score 0,discuss advance directive,need eye exam appointment,due for flu and tdap vaccine     HPI: Patient is a 77 y.o. female seen in the office today for routine follow up.   Osteoporosis, unspecified osteoporosis type, unspecified pathological fracture presence -noted on dexa from 2010, not currently on treatment. ordered dexa scan at last OV- but has not been done, unsure why she has not gotten this done.   Major depressive disorder  Stable on Wellbutrin twice daily. therapy/counselling services were encouraged at last OV however did not go feels like she is controlled well on medication   Mixed hyperlipidemia -continues on lipitor 40 mg daily LDL at goal 36 in July   Essential hypertension Stable on lopressor and osartan 100 mg daily   Diabetes mellitus without complication ([place]) Continues on metformin 500 mg by mouth twice daily- A1c 6.1 in July  Been over a year since eye exam.   Blepharospasm on clonazepam which is the only thing that she has found to give her relief.   Subarachnoid hemorrhage ([place]) -pt has not followed up with neurosurgery for several years. Had recommended in the past but they did not go. referral to Neurosurgery was placed at last OV- she went to see Dr Kathyrn Sheriff and he wanted to do a MRI  and then she forgot about it. Now having headaches.    Review of Systems:  Review of Systems  Constitutional: Negative for chills, fever and weight loss.  HENT: Positive for hearing loss.   Respiratory: Negative for cough, sputum production and shortness of breath.        Smoker  Cardiovascular: Negative for chest pain, palpitations and leg swelling.  Gastrointestinal: Positive for heartburn (controlled on protonix). Negative for abdominal pain, constipation and diarrhea.  Genitourinary: Negative for dysuria, frequency and urgency.       Incontinence of urine; wears pads  Musculoskeletal: Positive for falls (1-2 since july but overall has improved). Negative for back pain, joint pain and myalgias.  Skin: Negative.   Neurological: Positive for headaches (chronic hx of headaches). Negative for dizziness.  Psychiatric/Behavioral: Positive for depression (has improved) and memory loss. The patient does not have insomnia.     Past Medical History:  Diagnosis Date  . Anxiety and depression    Hx of  . Blepharospasm    . Breast cancer ([place])    s/p lumpectomy  . Carotid bruit    Bilateral. Mild to moderate disease on LEFT and mild on the RIGHT in 2012  . COPD (chronic obstructive pulmonary disease) ([place])   . Diabetes mellitus without complication ([place])    AODM  . Fuchs' corneal dystrophy   . GERD (gastroesophageal reflux disease)    . Hyperlipidemia   . Hypertension    . Insomnia    . Intracerebral bleed ([place]) 2003   Remote intracerebral blled and coiling by Dr. Estanislado Pandy for cerebral aneurysm ans  a shunt placed by Dr. Vertell Limber remotely. No seizure disorder and this happened in 2003 with an anterior communicating aneurysm.  Marland Kitchen Neurocardiogenic syncope    a. s/p Biotronik dual chamber pacemaker  . Personal history of radiation therapy   . Sinus node dysfunction (HCC)    . Tobacco abuse    . Type II or unspecified type diabetes mellitus without mention of complication, not stated as  uncontrolled     Past Surgical History:  Procedure Laterality Date  . ANEURYSM COILING  2003   Cerebral aneurysm ;Dr. Luanne Bras  . BREAST LUMPECTOMY Right    For cancer, no recurrence  . CHOLECYSTECTOMY    . PACEMAKER GENERATOR CHANGE N/A 09/25/2014   BTK dual chamber pacemkaer implanted by Dr Rayann Heman  . PACEMAKER INSERTION  07/12/2006   St. Jude Victory XL DR  -  Dr. Tami Ribas  . PARTIAL HYSTERECTOMY    . VENTRICULOPERITONEAL SHUNT  2003   Dr. Erline Levine   Social History:   reports that she has quit smoking. Her smoking use included cigarettes. She has a 7.50 pack-year smoking history. She has never used smokeless tobacco. She reports that she does not drink alcohol or use drugs.  Family History  Problem Relation Age of Onset  . Cancer Mother   . Heart attack Father   . Hypertension Unknown     Medications: Patient's Medications  New Prescriptions   No medications on file  Previous Medications   ASPIRIN 81 MG TABLET    Take 81 mg by mouth daily.   ATORVASTATIN (LIPITOR) 40 MG TABLET    Take 1 tablet (40 mg total) by mouth daily.   BUPROPION (WELLBUTRIN SR) 150 MG 12 HR TABLET    TAKE 1 TABLET BY MOUTH TWICE DAILY. APPOINTMENT DUE   CLONAZEPAM (KLONOPIN) 1 MG TABLET    TAKE 1 TABLET BY MOUTH THREE TIMES DAILY AS NEEDED   LOSARTAN (COZAAR) 100 MG TABLET    TAKE 1 TABLET BY MOUTH DAILY   MELATONIN 5 MG TABS    1 nightly as needed for sleep   METFORMIN (GLUCOPHAGE) 500 MG TABLET    TAKE 1 TABLET BY MOUTH WITH BREAKFAST AND WITH SUPPER FOR DIABETES   METOPROLOL TARTRATE (LOPRESSOR) 50 MG TABLET    TAKE 1 TABLET BY MOUTH TWICE DAILY   PANTOPRAZOLE (PROTONIX) 40 MG TABLET    TAKE 1 TABLET BY MOUTH EVERY DAY X 7 DAYS, THEN TAKE 1 TABLET BY MOUTH TWICE DAILY  Modified Medications   No medications on file  Discontinued Medications   No medications on file     Physical Exam:  Vitals:   10/01/18 1423  BP: 134/82  Pulse: 91  Temp: 97.6 F (36.4 C)  TempSrc: Oral    SpO2: 97%  Weight: 189 lb 6.4 oz (85.9 kg)  Height: 5\' 9"  (1.753 m)   Body mass index is 27.97 kg/m.  Physical Exam  Constitutional: She is oriented to person, place, and time. She appears well-developed and well-nourished. No distress.  HENT:  Nose: Nose normal.  Mouth/Throat: Oropharynx is clear and moist. No oropharyngeal exudate.  Ventriculoperitoneal shunt. Upper dentures  Eyes: Pupils are equal, round, and reactive to light. Conjunctivae and EOM are normal. No scleral icterus.  Neck: Normal range of motion. Neck supple.  Cardiovascular: Normal rate, regular rhythm and normal heart sounds.  Pulmonary/Chest: Effort normal and breath sounds normal.  Pacemaker right upper chest  Abdominal: Soft. Bowel sounds are normal. She exhibits no distension and no  mass. There is no tenderness.  Musculoskeletal: Normal range of motion. She exhibits no edema or tenderness.  Neurological: She is alert and oriented to person, place, and time. No cranial nerve deficit. Coordination normal.  Skin: Skin is warm and dry. No rash noted. She is not diaphoretic. No erythema. No pallor.  Psychiatric: She has a normal mood and affect.    Labs reviewed: Basic Metabolic Panel: Recent Labs    11/15/17 1110 05/22/18 0913 06/01/18 0815  NA 133* 137 137  K 4.6 4.9 4.1  CL 99 103 103  CO2 28 28 26   GLUCOSE 113* 118* 127*  BUN 17 21 17   CREATININE 0.80 1.05* 0.93  CALCIUM 8.9 9.2 8.7*  TSH  --  4.95*  --    Liver Function Tests: Recent Labs    11/07/17 1138 05/22/18 0913  AST 13 10  ALT 10 11  BILITOT 0.7 0.7  PROT 6.0* 6.1   No results for input(s): LIPASE, AMYLASE in the last 8760 hours. No results for input(s): AMMONIA in the last 8760 hours. CBC: Recent Labs    11/07/17 1138 05/22/18 0913 06/01/18 0815  WBC 6.6 6.7 12.3*  NEUTROABS 3,709 3,424  --   HGB 11.9 11.6* 11.1*  HCT 35.1 34.9* 34.8*  MCV 82.6 85.3 88.5  PLT 250 257 240   Lipid Panel: Recent Labs     05/22/18 0913  CHOL 98  HDL 48*  LDLCALC 36  TRIG 59  CHOLHDL 2.0   TSH: Recent Labs    05/22/18 0913  TSH 4.95*   A1C: Lab Results  Component Value Date   HGBA1C 6.1 (H) 05/22/2018     Assessment/Plan 1. Osteoporosis, unspecified osteoporosis type, unspecified pathological fracture presence -encouraged to follow up dexa scan- number provided to schedule appt Recommended to take caltrate with D 600/400 twice a day.  - BASIC METABOLIC PANEL WITH GFR  2. Major depressive disorder with single episode, in partial remission (HCC) -stable, continue wellbutrin   3. Mixed hyperlipidemia Stable, continue on lipitor  - BASIC METABOLIC PANEL WITH GFR  4. Essential hypertension Stable, continues on losartan and lopressor  - Hemoglobin A1c - CBC with Differential/Platelets - BASIC METABOLIC PANEL WITH GFR  5. Diabetes mellitus without complication ([place]) -stable, encouraged diabetic diet as she likes sweets.  -encouraged follow up with ophthalmology and routine examination of the feet daily  - Hemoglobin A1c  6. Blepharospasm Continue klonopin TID PRN, aware of risk of adverse drug reaction, only medication that has helped.   7. Overweight with body mass index (BMI) 25.0-29.9 -noted, discussed proper food choices.   8. Body mass index 27.0-27.9, adult Noted today  9. Elevated TSH - TSH  10. Encounter for colorectal cancer screening - Ambulatory referral to Gastroenterology  11. Subarachnoid hemmorhage -with headaches and followed up with neurosurgery however did not get MRI, encouraged to call office to set this up for proper evaluation   12. Tobacco abuse Has quit smoking. Continues with cessation.   13. Need for influenza vaccination - Flu vaccine HIGH DOSE PF (Fluzone High dose)  Next appt: 4 months  Ricquel Foulk K. Thorp, [facility] [phone]

## 2018-10-01 NOTE — Patient Instructions (Addendum)
To call regarding follow up [facility] [address], [place] [address] [place], [place] [address] Phone [phone]  BONE DENSITY  To call [facility] [phone]   To make follow up with ophthalmologist for diabetic eye exam   Follow up in 4 months for routine follow up

## 2018-10-02 ENCOUNTER — Other Ambulatory Visit: Payer: Self-pay | Admitting: Nurse Practitioner

## 2018-10-02 ENCOUNTER — Other Ambulatory Visit: Payer: Self-pay | Admitting: Interventional Cardiology

## 2018-10-02 DIAGNOSIS — F324 Major depressive disorder, single episode, in partial remission: Secondary | ICD-10-CM

## 2018-10-02 DIAGNOSIS — G245 Blepharospasm: Secondary | ICD-10-CM

## 2018-10-02 LAB — BASIC METABOLIC PANEL WITH GFR
BUN: 18 mg/dL (ref 7–25)
CO2: 28 mmol/L (ref 20–32)
Calcium: 9.8 mg/dL (ref 8.6–10.4)
Chloride: 102 mmol/L (ref 98–110)
Creat: 0.84 mg/dL (ref 0.60–0.93)
GFR, Est African American: 78 mL/min/{1.73_m2} (ref >=60)
GFR, Est Non African American: 67 mL/min/{1.73_m2} (ref >=60)
Glucose, Bld: 103 mg/dL (ref 65–139)
Potassium: 4.8 mmol/L (ref 3.5–5.3)
Sodium: 139 mmol/L (ref 135–146)

## 2018-10-02 LAB — CBC WITH DIFFERENTIAL/PLATELET
Basophils Absolute: 42 cells/uL (ref 0–200)
Basophils Relative: 0.6 %
Eosinophils Absolute: 70 cells/uL (ref 15–500)
Eosinophils Relative: 1 %
HCT: 37 % (ref 35.0–45.0)
Hemoglobin: 12.3 g/dL (ref 11.7–15.5)
Lymphs Abs: 1813 cells/uL (ref 850–3900)
MCH: 28.2 pg (ref 27.0–33.0)
MCHC: 33.2 g/dL (ref 32.0–36.0)
MCV: 84.9 fL (ref 80.0–100.0)
MPV: 10.5 fL (ref 7.5–12.5)
Monocytes Relative: 6.3 %
Neutro Abs: 4634 cells/uL (ref 1500–7800)
Neutrophils Relative %: 66.2 %
Platelets: 305 10*3/uL (ref 140–400)
RBC: 4.36 10*6/uL (ref 3.80–5.10)
RDW: 13.1 % (ref 11.0–15.0)
Total Lymphocyte: 25.9 %
WBC mixed population: 441 cells/uL (ref 200–950)
WBC: 7 10*3/uL (ref 3.8–10.8)

## 2018-10-02 LAB — TSH: TSH: 1.74 mIU/L (ref 0.40–4.50)

## 2018-10-02 LAB — HEMOGLOBIN A1C
Hgb A1c MFr Bld: 6.6 % of total Hgb — ABNORMAL HIGH (ref <5.7)
Mean Plasma Glucose: 143 (calc)
eAG (mmol/L): 7.9 (calc)

## 2018-10-02 NOTE — Telephone Encounter (Signed)
[place] Database verified and compliance confirmed   

## 2018-10-25 ENCOUNTER — Encounter: Payer: Self-pay | Admitting: Internal Medicine

## 2018-10-25 ENCOUNTER — Encounter: Payer: Self-pay | Admitting: Gastroenterology

## 2018-10-28 ENCOUNTER — Encounter: Payer: Medicare Other | Admitting: Internal Medicine

## 2018-10-29 ENCOUNTER — Other Ambulatory Visit (HOSPITAL_COMMUNITY): Payer: Self-pay | Admitting: Neurosurgery

## 2018-10-29 ENCOUNTER — Encounter: Payer: Self-pay | Admitting: Internal Medicine

## 2018-10-29 DIAGNOSIS — I609 Nontraumatic subarachnoid hemorrhage, unspecified: Secondary | ICD-10-CM

## 2018-10-30 ENCOUNTER — Other Ambulatory Visit: Payer: Self-pay | Admitting: Nurse Practitioner

## 2018-10-30 DIAGNOSIS — G245 Blepharospasm: Secondary | ICD-10-CM

## 2018-10-31 NOTE — Telephone Encounter (Signed)
[place] Database verified and compliance confirmed   

## 2018-11-04 ENCOUNTER — Other Ambulatory Visit: Payer: Self-pay | Admitting: *Deleted

## 2018-11-04 MED ORDER — ATORVASTATIN CALCIUM 40 MG PO TABS: 40.0000 mg | ORAL_TABLET | Freq: Every day | ORAL | 3 refills | Status: DC

## 2018-11-09 LAB — CUP PACEART REMOTE DEVICE CHECK
Date Time Interrogation Session: 20191228174904
Implantable Lead Implant Date: 20070830
Implantable Lead Implant Date: 20070830
Implantable Lead Location: 753859
Implantable Lead Location: 753860
Implantable Pulse Generator Implant Date: 20151113
Pulse Gen Model: 394931
Pulse Gen Serial Number: 68408118

## 2018-11-14 ENCOUNTER — Encounter: Payer: Self-pay | Admitting: Nurse Practitioner

## 2018-11-21 DIAGNOSIS — H5203 Hypermetropia, bilateral: Secondary | ICD-10-CM | POA: Diagnosis not present

## 2018-11-21 DIAGNOSIS — H1851 Endothelial corneal dystrophy: Secondary | ICD-10-CM | POA: Diagnosis not present

## 2018-11-21 DIAGNOSIS — H52223 Regular astigmatism, bilateral: Secondary | ICD-10-CM | POA: Diagnosis not present

## 2018-11-21 DIAGNOSIS — E119 Type 2 diabetes mellitus without complications: Secondary | ICD-10-CM | POA: Diagnosis not present

## 2018-11-21 LAB — HM DIABETES EYE EXAM

## 2018-11-25 ENCOUNTER — Other Ambulatory Visit: Payer: Self-pay | Admitting: Nurse Practitioner

## 2018-11-25 DIAGNOSIS — E119 Type 2 diabetes mellitus without complications: Secondary | ICD-10-CM

## 2018-11-26 ENCOUNTER — Ambulatory Visit: Payer: Medicare Other | Admitting: Nurse Practitioner

## 2018-11-27 ENCOUNTER — Other Ambulatory Visit: Payer: Self-pay | Admitting: Nurse Practitioner

## 2018-11-27 ENCOUNTER — Other Ambulatory Visit (HOSPITAL_COMMUNITY): Payer: Self-pay | Admitting: Physician Assistant

## 2018-11-27 ENCOUNTER — Other Ambulatory Visit: Payer: Self-pay | Admitting: Physician Assistant

## 2018-11-27 ENCOUNTER — Encounter: Payer: Medicare Other | Admitting: Gastroenterology

## 2018-11-27 DIAGNOSIS — I609 Nontraumatic subarachnoid hemorrhage, unspecified: Secondary | ICD-10-CM

## 2018-11-27 DIAGNOSIS — G245 Blepharospasm: Secondary | ICD-10-CM

## 2018-11-27 NOTE — Telephone Encounter (Signed)
Next appointment scheduled with Dr. Mariea Clonts on 02/03/19 Last refill given 90 tablets on 10/31/18 by Janett Billow  [place] data base verified

## 2018-12-02 ENCOUNTER — Encounter: Payer: Self-pay | Admitting: *Deleted

## 2018-12-04 ENCOUNTER — Ambulatory Visit (INDEPENDENT_AMBULATORY_CARE_PROVIDER_SITE_OTHER): Payer: Medicare Other

## 2018-12-04 DIAGNOSIS — R55 Syncope and collapse: Secondary | ICD-10-CM

## 2018-12-04 DIAGNOSIS — I495 Sick sinus syndrome: Secondary | ICD-10-CM | POA: Diagnosis not present

## 2018-12-05 LAB — CUP PACEART REMOTE DEVICE CHECK
Date Time Interrogation Session: 20200123133423
Implantable Lead Implant Date: 20070830
Implantable Lead Implant Date: 20070830
Implantable Lead Location: 753859
Implantable Lead Location: 753860
Implantable Pulse Generator Implant Date: 20151113
Pulse Gen Model: 394931
Pulse Gen Serial Number: 68408118

## 2018-12-05 NOTE — Progress Notes (Signed)
Remote pacemaker transmission.   

## 2018-12-06 ENCOUNTER — Encounter: Payer: Self-pay | Admitting: Cardiology

## 2018-12-18 ENCOUNTER — Other Ambulatory Visit: Payer: Self-pay | Admitting: Nurse Practitioner

## 2018-12-18 DIAGNOSIS — I10 Essential (primary) hypertension: Secondary | ICD-10-CM

## 2018-12-19 ENCOUNTER — Ambulatory Visit (HOSPITAL_COMMUNITY)
Admission: RE | Admit: 2018-12-19 | Discharge: 2018-12-19 | Disposition: A | Discharge status: Home / Self Care | Payer: Medicare Other | Source: Ambulatory Visit | Attending: Physician Assistant | Admitting: Physician Assistant

## 2018-12-19 DIAGNOSIS — I671 Cerebral aneurysm, nonruptured: Secondary | ICD-10-CM | POA: Diagnosis not present

## 2018-12-19 DIAGNOSIS — I609 Nontraumatic subarachnoid hemorrhage, unspecified: Secondary | ICD-10-CM | POA: Diagnosis not present

## 2018-12-19 DIAGNOSIS — R51 Headache: Secondary | ICD-10-CM | POA: Diagnosis not present

## 2018-12-19 LAB — POCT I-STAT CREATININE: Creatinine, Ser: 0.8 mg/dL (ref 0.44–1.00)

## 2018-12-19 MED ORDER — IOPAMIDOL (ISOVUE-370) INJECTION 76%
100.0000 mL | Freq: Once | INTRAVENOUS | Status: AC | PRN
  Administered 2018-12-19: 100 mL via INTRAVENOUS

## 2018-12-30 ENCOUNTER — Other Ambulatory Visit: Payer: Self-pay | Admitting: Nurse Practitioner

## 2018-12-30 DIAGNOSIS — G245 Blepharospasm: Secondary | ICD-10-CM

## 2018-12-30 NOTE — Telephone Encounter (Signed)
Last OV 10/01/2018 Last refill 11/28/2018 Database verified

## 2019-01-13 ENCOUNTER — Encounter: Payer: Medicare Other | Admitting: Internal Medicine

## 2019-01-13 DIAGNOSIS — R0989 Other specified symptoms and signs involving the circulatory and respiratory systems: Secondary | ICD-10-CM

## 2019-01-14 ENCOUNTER — Encounter: Payer: Self-pay | Admitting: Internal Medicine

## 2019-02-03 ENCOUNTER — Ambulatory Visit: Payer: Medicare Other | Admitting: Nurse Practitioner

## 2019-02-10 ENCOUNTER — Other Ambulatory Visit: Payer: Self-pay | Admitting: Internal Medicine

## 2019-02-10 ENCOUNTER — Other Ambulatory Visit: Payer: Self-pay | Admitting: Nurse Practitioner

## 2019-02-10 MED ORDER — ATORVASTATIN CALCIUM 40 MG PO TABS: 40.0000 mg | ORAL_TABLET | Freq: Every day | ORAL | 1 refills | Status: DC

## 2019-02-17 ENCOUNTER — Ambulatory Visit (INDEPENDENT_AMBULATORY_CARE_PROVIDER_SITE_OTHER): Payer: Medicare Other | Admitting: Nurse Practitioner

## 2019-02-17 ENCOUNTER — Other Ambulatory Visit: Payer: Self-pay

## 2019-02-17 ENCOUNTER — Encounter: Payer: Self-pay | Admitting: Nurse Practitioner

## 2019-02-17 DIAGNOSIS — I495 Sick sinus syndrome: Secondary | ICD-10-CM

## 2019-02-17 DIAGNOSIS — I609 Nontraumatic subarachnoid hemorrhage, unspecified: Secondary | ICD-10-CM

## 2019-02-17 DIAGNOSIS — E119 Type 2 diabetes mellitus without complications: Secondary | ICD-10-CM

## 2019-02-17 DIAGNOSIS — F331 Major depressive disorder, recurrent, moderate: Secondary | ICD-10-CM | POA: Diagnosis not present

## 2019-02-17 DIAGNOSIS — K219 Gastro-esophageal reflux disease without esophagitis: Secondary | ICD-10-CM

## 2019-02-17 DIAGNOSIS — I10 Essential (primary) hypertension: Secondary | ICD-10-CM | POA: Diagnosis not present

## 2019-02-17 DIAGNOSIS — G245 Blepharospasm: Secondary | ICD-10-CM

## 2019-02-17 DIAGNOSIS — E785 Hyperlipidemia, unspecified: Secondary | ICD-10-CM

## 2019-02-17 MED ORDER — SERTRALINE HCL 50 MG PO TABS: ORAL_TABLET | ORAL | 1 refills | Status: DC

## 2019-02-17 NOTE — Patient Instructions (Signed)
To take blood pressure ~1 hour after you have taken your blood pressure medication  To start zoloft 25 mg by mouth daily, after 2 weeks increase to 50 mg (AFTER we have checked labs to ensure electrolytes are doing good)

## 2019-02-17 NOTE — Progress Notes (Signed)
This service is provided via telemedicine  No vital signs collected/recorded due to the encounter was a telemedicine visit.   Location of patient (ex: home, work):  Home  Patient consents to a telephone visit:  Yes  Location of the provider (ex: office, home): [employer], Office   Names of all persons participating in the telemedicine service and their role in the encounter:  S.Chrae B/CMA, Sherrie Mustache, NP, and Patient    Time spent on call:  9 min with medical assistant   Virtual Visit via Telephone Note  I connected with Su Grand on 02/17/19 at  1:00 PM EDT by telephone and verified that I am speaking with the correct person using two identifiers.   I discussed the limitations, risks, security and privacy concerns of performing an evaluation and management service by telephone and the availability of in person appointments. I also discussed with the patient that there may be a patient responsible charge related to this service. The patient expressed understanding and agreed to proceed.      Careteam: Patient Care Team: [name], NP as PCP - General (Geriatric Medicine) Patsey Berthold, NP as Nurse Practitioner (Cardiology) Darleen Crocker, MD as Consulting Physician (Ophthalmology) Thompson Grayer, MD as Consulting Physician (Cardiology) Otelia Sergeant, OD as Referring Physician (Ophthalmology)  Advanced Directive information    Allergies  Allergen Reactions  . Oxaprozin Rash    Chief Complaint  Patient presents with  . Medical Management of Chronic Issues    4 month follow-up, Tele-visit. Moderate fall risk   . Medication Management    Discuss increasing metformin   . Best Practice Recommendations    Discuss need forTDaP      HPI: Patient is a 78 y.o. female for routine follow up via tele-visit  Osteoporosis-  -noted on dexa from 2010, not currently on treatment.ordered dexa scan at last OV- but has not been done, unsure why  she has not gotten this done. needs to schedule but due to COVID will continue to wait on this.   Major depressive disorder- Stable on Wellbutrin twice daily. Some days better than others, depressed over COVID-19.  therapy/counselling services were encouraged but does not wish to do this. Feels like if she really wants it then will call us. Son reports depression is much worse and request medication be adjusted. No SI or HI   Mixed hyperlipidemia-continues on lipitor 40 mg daily LDL at goal 36 in July 2019  Essential hypertension- Stable on lopressor and losartan 100 mg daily - reports it was high but does not have reading. Unsure if she took blood pressure medication when she took the blood pressure. Having trouble remembering.   Diabetes mellitus-Continues on metformin 500 mg by mouth twice daily- A1c 6.6 in November. Reports mouth gets dry and questions if she should take more metformin. Does not check blood sugar. Will occasionally have an episode of hypoglycemia or so it feels like. She does not check her blood sugar.   Blepharospasm- stable on clonazepam which is the only thing that she has found to give her relief.   Subarachnoid hemorrhage- has followed up with neurosurgery and had CT unsure who she followed up with. Continues to have headache. Son gets on call and reports she was supposed to have more imaging which they have not been able to do at this time due to [place].   Hx of tobacco abuse- continues with cessation.   GERD- controlled protonix   Review of Systems:  Review of Systems  Constitutional: Negative for chills, fever and weight loss.  HENT: Positive for hearing loss.   Respiratory: Negative for cough, sputum production and shortness of breath.        Smoker  Cardiovascular: Negative for chest pain, palpitations and leg swelling.  Gastrointestinal: Positive for heartburn (controlled on protonix). Negative for abdominal pain, constipation and diarrhea.   Genitourinary: Negative for dysuria, frequency and urgency.       Incontinence of urine; wears pads  Musculoskeletal: Positive for falls. Negative for back pain, joint pain and myalgias.  Skin: Negative.   Neurological: Positive for headaches (chronic hx of headaches, more often, following with neurosurgery at this time.). Negative for dizziness.  Psychiatric/Behavioral: Positive for depression (worse due to COVID however son reports it was getting bad before the pandemic started) and memory loss. The patient does not have insomnia.     Past Medical History:  Diagnosis Date  . Anxiety and depression    Hx of  . Blepharospasm    . Breast cancer ([place])    s/p lumpectomy  . Carotid bruit    Bilateral. Mild to moderate disease on LEFT and mild on the RIGHT in 2012  . COPD (chronic obstructive pulmonary disease) ([place])   . Diabetes mellitus without complication ([place])    AODM  . Fuchs' corneal dystrophy   . GERD (gastroesophageal reflux disease)    . Hyperlipidemia   . Hypertension    . Insomnia    . Intracerebral bleed ([place]) 2003   Remote intracerebral blled and coiling by Dr. Estanislado Pandy for cerebral aneurysm ans a shunt placed by Dr. Vertell Limber remotely. No seizure disorder and this happened in 2003 with an anterior communicating aneurysm.  Marland Kitchen Neurocardiogenic syncope    a. s/p Biotronik dual chamber pacemaker  . Personal history of radiation therapy   . Sinus node dysfunction (HCC)    . Tobacco abuse    . Type II or unspecified type diabetes mellitus without mention of complication, not stated as uncontrolled     Past Surgical History:  Procedure Laterality Date  . ANEURYSM COILING  2003   Cerebral aneurysm ;Dr. Luanne Bras  . BREAST LUMPECTOMY Right    For cancer, no recurrence  . CHOLECYSTECTOMY    . PACEMAKER GENERATOR CHANGE N/A 09/25/2014   BTK dual chamber pacemkaer implanted by Dr Rayann Heman  . PACEMAKER INSERTION  07/12/2006   [employer] Victory XL DR  -  Dr. Tami Ribas  .  PARTIAL HYSTERECTOMY    . VENTRICULOPERITONEAL SHUNT  2003   Dr. Erline Levine   Social History:   reports that she has quit smoking. Her smoking use included cigarettes. She has a 7.50 pack-year smoking history. She has never used smokeless tobacco. She reports that she does not drink alcohol or use drugs.  Family History  Problem Relation Age of Onset  . Cancer Mother   . Heart attack Father   . Hypertension Other     Medications: Patient's Medications  New Prescriptions   No medications on file  Previous Medications   ASPIRIN EC 325 MG TABLET    Take 325 mg by mouth daily.   ATORVASTATIN (LIPITOR) 40 MG TABLET    Take 1 tablet (40 mg total) by mouth daily. Please keep upcoming appt for future refills. Thank you   BUPROPION (WELLBUTRIN SR) 150 MG 12 HR TABLET    TAKE 1 TABLET BY MOUTH TWICE DAILY   CARBOXYMETHYLCELLULOSE (REFRESH PLUS) 0.5 % SOLN    Place 1  drop into both eyes as needed.   CLONAZEPAM (KLONOPIN) 1 MG TABLET    TAKE 1 TABLET BY MOUTH THREE TIMES DAILY AS NEEDED   LOSARTAN (COZAAR) 100 MG TABLET    TAKE 1 TABLET BY MOUTH DAILY   METFORMIN (GLUCOPHAGE) 500 MG TABLET    TAKE 1 TABLET BY MOUTH WITH BREAKFAST AND WITH SUPPER FOR DIABETES   METOPROLOL TARTRATE (LOPRESSOR) 50 MG TABLET    TAKE 1 TABLET BY MOUTH TWICE DAILY   PANTOPRAZOLE (PROTONIX) 40 MG TABLET    TAKE 1 TABLET BY MOUTH TWICE DAILY   POLYETHYL GLYCOL-PROPYL GLYCOL (SYSTANE) 0.4-0.3 % SOLN    Apply to eye as needed.   PREDNISOLONE ACETATE (PRED FORTE) 1 % OPHTHALMIC SUSPENSION    1 drop in both eyes twice daily   TETRAHYDROZOLINE (VISINE) 0.05 % OPHTHALMIC SOLUTION    as needed.  Modified Medications   No medications on file  Discontinued Medications   ASPIRIN 81 MG TABLET    Take 81 mg by mouth daily.     Physical Exam: unable due to tele-visit.    Labs reviewed: Basic Metabolic Panel: Recent Labs    05/22/18 0913 06/01/18 0815 10/01/18 1520 12/19/18 1516  NA 137 137 139  --   K 4.9 4.1 4.8   --   CL 103 103 102  --   CO2 '28 26 28  '$ --   GLUCOSE 118* 127* 103  --   BUN '21 17 18  '$ --   CREATININE 1.05* 0.93 0.84 0.80  CALCIUM 9.2 8.7* 9.8  --   TSH 4.95*  --  1.74  --    Liver Function Tests: Recent Labs    05/22/18 0913  AST 10  ALT 11  BILITOT 0.7  PROT 6.1   No results for input(s): LIPASE, AMYLASE in the last 8760 hours. No results for input(s): AMMONIA in the last 8760 hours. CBC: Recent Labs    05/22/18 0913 06/01/18 0815 10/01/18 1520  WBC 6.7 12.3* 7.0  NEUTROABS 3,424  --  4,634  HGB 11.6* 11.1* 12.3  HCT 34.9* 34.8* 37.0  MCV 85.3 88.5 84.9  PLT 257 240 305   Lipid Panel: Recent Labs    05/22/18 0913  CHOL 98  HDL 48*  LDLCALC 36  TRIG 59  CHOLHDL 2.0   TSH: Recent Labs    05/22/18 0913 10/01/18 1520  TSH 4.95* 1.74   A1C: Lab Results  Component Value Date   HGBA1C 6.6 (H) 10/01/2018     Assessment/Plan   1. Moderate episode of recurrent major depressive disorder -declines psych services at this time however they would benefit her and discussed this with her and son. At this time to continue Wellbutrin twice daily -will add zoloft 25 mg by mouth daily to follow up labs after 2 weeks since she has had a hx of hyponatremia in the past, most recent labs were WNL - sertraline (ZOLOFT) 50 MG tablet; 1/2 tablet daily for 2 weeks then increase to 1 tablet daily  Dispense: 30 tablet; Refill: 1 - BMP with eGFR([facility]); Future - COMPLETE METABOLIC PANEL WITH GFR; Future  2. Gastroesophageal reflux disease without esophagitis Stable on protonix  3. Essential hypertension -elevated on check but unsure what the readings were and if she had actually taken her medication prior to take blood pressure -to take blood pressure at home AFTER medication has been taken and we will follow up in 2 weeks with tele-visit.  - COMPLETE METABOLIC PANEL WITH GFR;  Future  4. Subarachnoid hemorrhage ([place]) -has followed up with neurosurgery, per son MRI has  been requested.   5. Type 2 diabetes mellitus without complication, without long-term current use of insulin ([place]) -continue metformin twice daily with dietary modifications.  - Hemoglobin A1c; Future  6. Sinus node dysfunction (HCC) S/p pacer, following with Dr Rayann Heman and pacer check done 12/04/2018  7. Blepharospasm Stable on clonazepam. No side effects noted from medication and has tired multiple other treatments to control symptoms without effect.   8. Hyperlipidemia, unspecified hyperlipidemia type -LDL at goal in July, continues on lipitor.  - COMPLETE METABOLIC PANEL WITH GFR; Future -lipid panel.    Next appt: 2 weeks for lab and then follow up televisit on bp and depression  K. Harle Battiest  [facility] [phone]    Follow Up Instructions:    I discussed the assessment and treatment plan with the patient. The patient was provided an opportunity to ask questions and all were answered. The patient agreed with the plan and demonstrated an understanding of the instructions.   The patient was advised to call back or seek an in-person evaluation if the symptoms worsen or if the condition fails to improve as anticipated.  I provided 24 minutes of non-face-to-face time during this encounter.   [name], NP

## 2019-02-24 ENCOUNTER — Other Ambulatory Visit: Payer: Self-pay | Admitting: Nurse Practitioner

## 2019-02-24 DIAGNOSIS — G245 Blepharospasm: Secondary | ICD-10-CM

## 2019-02-24 NOTE — Telephone Encounter (Signed)
Last filled 01/27/2019 [place] Database verified and compliance confirmed

## 2019-03-03 ENCOUNTER — Other Ambulatory Visit: Payer: Medicare Other

## 2019-03-04 ENCOUNTER — Telehealth: Payer: Self-pay

## 2019-03-04 ENCOUNTER — Other Ambulatory Visit: Payer: Self-pay

## 2019-03-04 ENCOUNTER — Encounter: Payer: Medicare Other | Admitting: Nurse Practitioner

## 2019-03-04 NOTE — Telephone Encounter (Signed)
Called patient at 8:35 and was told by female patient sleeping and I need to call at or after 10 am to complete tele-visit.  I called patient at 10:10, left message informing patient this was the second attempt to complete tele-visit and I will try again prior to canceling appointment for today. I advise that I would call back vs the patient calling me back for we will attempt to contact others scheduled for tele-visits

## 2019-03-04 NOTE — Telephone Encounter (Signed)
Noted, if we can get her to call back and reschedule at some point that would be great. Will fwd to Judson Roch to reschedule

## 2019-03-04 NOTE — Telephone Encounter (Signed)
Made 3rd attempt to contact patient with no success. Tele-visit Appointment will be un-arrived and canceled

## 2019-03-05 ENCOUNTER — Other Ambulatory Visit: Payer: Self-pay

## 2019-03-05 ENCOUNTER — Ambulatory Visit (INDEPENDENT_AMBULATORY_CARE_PROVIDER_SITE_OTHER): Payer: Medicare Other | Admitting: *Deleted

## 2019-03-05 ENCOUNTER — Encounter: Payer: Self-pay | Admitting: Nurse Practitioner

## 2019-03-05 ENCOUNTER — Ambulatory Visit (INDEPENDENT_AMBULATORY_CARE_PROVIDER_SITE_OTHER): Payer: Medicare Other | Admitting: Nurse Practitioner

## 2019-03-05 DIAGNOSIS — I495 Sick sinus syndrome: Secondary | ICD-10-CM | POA: Diagnosis not present

## 2019-03-05 DIAGNOSIS — F324 Major depressive disorder, single episode, in partial remission: Secondary | ICD-10-CM | POA: Diagnosis not present

## 2019-03-05 DIAGNOSIS — I10 Essential (primary) hypertension: Secondary | ICD-10-CM | POA: Diagnosis not present

## 2019-03-05 LAB — CUP PACEART REMOTE DEVICE CHECK
Date Time Interrogation Session: 20200422112854
Implantable Lead Implant Date: 20070830
Implantable Lead Implant Date: 20070830
Implantable Lead Location: 753859
Implantable Lead Location: 753860
Implantable Pulse Generator Implant Date: 20151113
Pulse Gen Model: 394931
Pulse Gen Serial Number: 68408118

## 2019-03-05 NOTE — Progress Notes (Signed)
This encounter was created in error - please disregard.

## 2019-03-05 NOTE — Progress Notes (Signed)
This service is provided via telemedicine  No vital signs collected/recorded due to the encounter was a telemedicine visit.   Location of patient (ex: home, work): Home  Patient consents to a telephone visit:  Yes  Location of the provider (ex: office, home): [facility], Office   Name of any referring provider:  N/A  Names of all persons participating in the telemedicine service and their role in the encounter:  S.Chrae B/CMA, Sherrie Mustache, NP, and Patient   Time spent on call: 5 min with medical assistant   Virtual Visit via Telephone Note  I connected with Mary Higgins on 03/05/19 at  9:00 AM EDT by telephone and verified that I am speaking with the correct person using two identifiers.   I discussed the limitations, risks, security and privacy concerns of performing an evaluation and management service by telephone and the availability of in person appointments. I also discussed with the patient that there may be a patient responsible charge related to this service. The patient expressed understanding and agreed to proceed.     Careteam: Patient Care Team: [name], NP as PCP - General (Geriatric Medicine) Patsey Berthold, NP as Nurse Practitioner (Cardiology) Darleen Crocker, MD as Consulting Physician (Ophthalmology) Thompson Grayer, MD as Consulting Physician (Cardiology) Otelia Sergeant, OD as Referring Physician (Ophthalmology)  Advanced Directive information    Allergies  Allergen Reactions  . Oxaprozin Rash    Chief Complaint  Patient presents with  . Follow-up    B/P and depression follow-up. Televisit      HPI: Patient is a 78 y.o. female to follow up on depression and blood pressure.   Depression- increase anxiety and depression noted, she was started on Zoloft 25 mg by mouth daily for ~ 2 weeks and reports she feels like anxiety and depression. Denies HI or SI.  Not having side effects.   Hypertension- reports blood  pressure has been doing fine however does not know what her blood pressures are. 140/99 today but has not taken her medication. Taking cozaar 100 mg mouth daily, lopressor 50 mg twice daily    Review of Systems:  Review of Systems  Constitutional: Negative for chills and fever.  Psychiatric/Behavioral: Positive for depression. The patient is nervous/anxious.        Overall mood has improved on zoloft.     Past Medical History:  Diagnosis Date  . Anxiety and depression    Hx of  . Blepharospasm    . Breast cancer ([place])    s/p lumpectomy  . Carotid bruit    Bilateral. Mild to moderate disease on LEFT and mild on the RIGHT in 2012  . COPD (chronic obstructive pulmonary disease) ([place])   . Diabetes mellitus without complication ([place])    AODM  . Fuchs' corneal dystrophy   . GERD (gastroesophageal reflux disease)    . Hyperlipidemia   . Hypertension    . Insomnia    . Intracerebral bleed ([place]) 2003   Remote intracerebral blled and coiling by Dr. Estanislado Pandy for cerebral aneurysm ans a shunt placed by Dr. Vertell Limber remotely. No seizure disorder and this happened in 2003 with an anterior communicating aneurysm.  Marland Kitchen Neurocardiogenic syncope    a. s/p Biotronik dual chamber pacemaker  . Personal history of radiation therapy   . Sinus node dysfunction (HCC)    . Tobacco abuse    . Type II or unspecified type diabetes mellitus without mention of complication, not stated as uncontrolled  Past Surgical History:  Procedure Laterality Date  . ANEURYSM COILING  2003   Cerebral aneurysm ;Dr. Luanne Bras  . BREAST LUMPECTOMY Right    For cancer, no recurrence  . CHOLECYSTECTOMY    . PACEMAKER GENERATOR CHANGE N/A 09/25/2014   BTK dual chamber pacemkaer implanted by Dr Rayann Heman  . PACEMAKER INSERTION  07/12/2006   [employer] Jude Victory XL DR  -  Dr. Tami Ribas  . PARTIAL HYSTERECTOMY    . VENTRICULOPERITONEAL SHUNT  2003   Dr. Erline Levine   Social History:   reports that she has been smoking  cigarettes. She has a 7.50 pack-year smoking history. She has never used smokeless tobacco. She reports that she does not drink alcohol or use drugs.  Family History  Problem Relation Age of Onset  . Cancer Mother   . Heart attack Father   . Hypertension Other     Medications: Patient's Medications  New Prescriptions   No medications on file  Previous Medications   ASPIRIN EC 325 MG TABLET    Take 325 mg by mouth daily.   ATORVASTATIN (LIPITOR) 40 MG TABLET    Take 1 tablet (40 mg total) by mouth daily. Please keep upcoming appt for future refills. Thank you   BUPROPION (WELLBUTRIN SR) 150 MG 12 HR TABLET    TAKE 1 TABLET BY MOUTH TWICE DAILY   CARBOXYMETHYLCELLULOSE (REFRESH PLUS) 0.5 % SOLN    Place 1 drop into both eyes as needed.   CLONAZEPAM (KLONOPIN) 1 MG TABLET    TAKE 1 TABLET BY MOUTH THREE TIMES DAILY AS NEEDED   LOSARTAN (COZAAR) 100 MG TABLET    TAKE 1 TABLET BY MOUTH DAILY   METFORMIN (GLUCOPHAGE) 500 MG TABLET    TAKE 1 TABLET BY MOUTH WITH BREAKFAST AND WITH SUPPER FOR DIABETES   METOPROLOL TARTRATE (LOPRESSOR) 50 MG TABLET    TAKE 1 TABLET BY MOUTH TWICE DAILY   PANTOPRAZOLE (PROTONIX) 40 MG TABLET    TAKE 1 TABLET BY MOUTH TWICE DAILY   POLYETHYL GLYCOL-PROPYL GLYCOL (SYSTANE) 0.4-0.3 % SOLN    Apply to eye as needed.   PREDNISOLONE ACETATE (PRED FORTE) 1 % OPHTHALMIC SUSPENSION    1 drop in both eyes twice daily   SERTRALINE (ZOLOFT) 50 MG TABLET    1/2 tablet daily for 2 weeks then increase to 1 tablet daily   TETRAHYDROZOLINE (VISINE) 0.05 % OPHTHALMIC SOLUTION    as needed.  Modified Medications   No medications on file  Discontinued Medications   No medications on file     Physical Exam:  Labs reviewed: Basic Metabolic Panel: Recent Labs    05/22/18 0913 06/01/18 0815 10/01/18 1520 12/19/18 1516  NA 137 137 139  --   K 4.9 4.1 4.8  --   CL 103 103 102  --   CO2 28 26 28   --   GLUCOSE 118* 127* 103  --   BUN 21 17 18   --   CREATININE 1.05* 0.93  0.84 0.80  CALCIUM 9.2 8.7* 9.8  --   TSH 4.95*  --  1.74  --    Liver Function Tests: Recent Labs    05/22/18 0913  AST 10  ALT 11  BILITOT 0.7  PROT 6.1   No results for input(s): LIPASE, AMYLASE in the last 8760 hours. No results for input(s): AMMONIA in the last 8760 hours. CBC: Recent Labs    05/22/18 0913 06/01/18 0815 10/01/18 1520  WBC 6.7 12.3* 7.0  NEUTROABS  3,424  --  4,634  HGB 11.6* 11.1* 12.3  HCT 34.9* 34.8* 37.0  MCV 85.3 88.5 84.9  PLT 257 240 305   Lipid Panel: Recent Labs    05/22/18 0913  CHOL 98  HDL 48*  LDLCALC 36  TRIG 59  CHOLHDL 2.0   TSH: Recent Labs    05/22/18 0913 10/01/18 1520  TSH 4.95* 1.74   A1C: Lab Results  Component Value Date   HGBA1C 6.6 (H) 10/01/2018     Assessment/Plan 1. Major depressive disorder with single episode, in partial remission (HCC) Improved with zoloft 25 mg by mouth daily- to come get lab work tomorrow (son can not bring her today) to follow up electrolytes -will continue zoloft 25 mg by mouth daily   2. Essential hypertension -continues on losartan and metoprolol, encouraged to take blood pressure after medication.  -continue dietary modifications.  Next appt: 03/06/2019 [employer]. Harle Battiest  [facility] [phone]   Follow Up Instructions:    I discussed the assessment and treatment plan with the patient. The patient was provided an opportunity to ask questions and all were answered. The patient agreed with the plan and demonstrated an understanding of the instructions.   The patient was advised to call back or seek an in-person evaluation if the symptoms worsen or if the condition fails to improve as anticipated.  I provided 9 minutes of non-face-to-face time during this encounter.  avs printed and mailed [name], NP

## 2019-03-05 NOTE — Patient Instructions (Signed)
Continue zoloft 25 mg by mouth daily  To take blood pressure after you have taken blood pressure medication and record   DASH Eating Plan DASH stands for "Dietary Approaches to Stop Hypertension." The DASH eating plan is a healthy eating plan that has been shown to reduce high blood pressure (hypertension). It may also reduce your risk for type 2 diabetes, heart disease, and stroke. The DASH eating plan may also help with weight loss. What are tips for following this plan?  General guidelines  Avoid eating more than 2,300 mg (milligrams) of salt (sodium) a day. If you have hypertension, you may need to reduce your sodium intake to 1,500 mg a day.  Limit alcohol intake to no more than 1 drink a day for nonpregnant women and 2 drinks a day for men. One drink equals 12 oz of beer, 5 oz of wine, or 1 oz of hard liquor.  Work with your health care provider to maintain a healthy body weight or to lose weight. Ask what an ideal weight is for you.  Get at least 30 minutes of exercise that causes your heart to beat faster (aerobic exercise) most days of the week. Activities may include walking, swimming, or biking.  Work with your health care provider or diet and nutrition specialist (dietitian) to adjust your eating plan to your individual calorie needs. Reading food labels   Check food labels for the amount of sodium per serving. Choose foods with less than 5 percent of the Daily Value of sodium. Generally, foods with less than 300 mg of sodium per serving fit into this eating plan.  To find whole grains, look for the word "whole" as the first word in the ingredient list. Shopping  Buy products labeled as "low-sodium" or "no salt added."  Buy fresh foods. Avoid canned foods and premade or frozen meals. Cooking  Avoid adding salt when cooking. Use salt-free seasonings or herbs instead of table salt or sea salt. Check with your health care provider or pharmacist before using salt  substitutes.  Do not fry foods. Cook foods using healthy methods such as baking, boiling, grilling, and broiling instead.  Cook with heart-healthy oils, such as olive, canola, soybean, or sunflower oil. Meal planning  Eat a balanced diet that includes: ? 5 or more servings of fruits and vegetables each day. At each meal, try to fill half of your plate with fruits and vegetables. ? Up to 6-8 servings of whole grains each day. ? Less than 6 oz of lean meat, poultry, or fish each day. A 3-oz serving of meat is about the same size as a deck of cards. One egg equals 1 oz. ? 2 servings of low-fat dairy each day. ? A serving of nuts, seeds, or beans 5 times each week. ? Heart-healthy fats. Healthy fats called Omega-3 fatty acids are found in foods such as flaxseeds and coldwater fish, like sardines, salmon, and mackerel.  Limit how much you eat of the following: ? Canned or prepackaged foods. ? Food that is high in trans fat, such as fried foods. ? Food that is high in saturated fat, such as fatty meat. ? Sweets, desserts, sugary drinks, and other foods with added sugar. ? Full-fat dairy products.  Do not salt foods before eating.  Try to eat at least 2 vegetarian meals each week.  Eat more home-cooked food and less restaurant, buffet, and fast food.  When eating at a restaurant, ask that your food be prepared with less salt  or no salt, if possible. What foods are recommended? The items listed may not be a complete list. Talk with your dietitian about what dietary choices are best for you. Grains Whole-grain or whole-wheat bread. Whole-grain or whole-wheat pasta. Brown rice. [place]. Bulgur. Whole-grain and low-sodium cereals. Pita bread. Low-fat, low-sodium crackers. Whole-wheat flour tortillas. Vegetables Fresh or frozen vegetables (raw, steamed, roasted, or grilled). Low-sodium or reduced-sodium tomato and vegetable juice. Low-sodium or reduced-sodium tomato sauce and tomato  paste. Low-sodium or reduced-sodium canned vegetables. Fruits All fresh, dried, or frozen fruit. Canned fruit in natural juice (without added sugar). Meat and other protein foods Skinless chicken or [place]. Ground chicken or [place]. Pork with fat trimmed off. Fish and seafood. Egg whites. Dried beans, peas, or lentils. Unsalted nuts, nut butters, and seeds. Unsalted canned beans. Lean cuts of beef with fat trimmed off. Low-sodium, lean deli meat. Dairy Low-fat (1%) or fat-free (skim) milk. Fat-free, low-fat, or reduced-fat cheeses. Nonfat, low-sodium ricotta or cottage cheese. Low-fat or nonfat yogurt. Low-fat, low-sodium cheese. Fats and oils Soft margarine without trans fats. Vegetable oil. Low-fat, reduced-fat, or light mayonnaise and salad dressings (reduced-sodium). Canola, safflower, olive, soybean, and sunflower oils. Avocado. Seasoning and other foods Herbs. Spices. Seasoning mixes without salt. Unsalted popcorn and pretzels. Fat-free sweets. What foods are not recommended? The items listed may not be a complete list. Talk with your dietitian about what dietary choices are best for you. Grains Baked goods made with fat, such as croissants, muffins, or some breads. Dry pasta or rice meal packs. Vegetables Creamed or fried vegetables. Vegetables in a cheese sauce. Regular canned vegetables (not low-sodium or reduced-sodium). Regular canned tomato sauce and paste (not low-sodium or reduced-sodium). Regular tomato and vegetable juice (not low-sodium or reduced-sodium). Angie Fava. Olives. Fruits Canned fruit in a light or heavy syrup. Fried fruit. Fruit in cream or butter sauce. Meat and other protein foods Fatty cuts of meat. Ribs. Fried meat. Berniece Salines. Sausage. Bologna and other processed lunch meats. Salami. Fatback. Hotdogs. Bratwurst. Salted nuts and seeds. Canned beans with added salt. Canned or smoked fish. Whole eggs or egg yolks. Chicken or [place] with skin. Dairy Whole or 2% milk,  cream, and half-and-half. Whole or full-fat cream cheese. Whole-fat or sweetened yogurt. Full-fat cheese. Nondairy creamers. Whipped toppings. Processed cheese and cheese spreads. Fats and oils Butter. Stick margarine. Lard. Shortening. Ghee. Bacon fat. Tropical oils, such as coconut, palm kernel, or palm oil. Seasoning and other foods Salted popcorn and pretzels. Onion salt, garlic salt, seasoned salt, table salt, and sea salt. Worcestershire sauce. Tartar sauce. Barbecue sauce. Teriyaki sauce. Soy sauce, including reduced-sodium. Steak sauce. Canned and packaged gravies. Fish sauce. Oyster sauce. Cocktail sauce. Horseradish that you find on the shelf. Ketchup. Mustard. Meat flavorings and tenderizers. Bouillon cubes. Hot sauce and Tabasco sauce. Premade or packaged marinades. Premade or packaged taco seasonings. Relishes. Regular salad dressings. Where to find more information:  National Heart, Lung, and [place]: https://wilson-eaton.com/  American Heart Association: www.heart.org Summary  The DASH eating plan is a healthy eating plan that has been shown to reduce high blood pressure (hypertension). It may also reduce your risk for type 2 diabetes, heart disease, and stroke.  With the DASH eating plan, you should limit salt (sodium) intake to 2,300 mg a day. If you have hypertension, you may need to reduce your sodium intake to 1,500 mg a day.  When on the DASH eating plan, aim to eat more fresh fruits and vegetables, whole grains, lean proteins, low-fat dairy,  and heart-healthy fats.  Work with your health care provider or diet and nutrition specialist (dietitian) to adjust your eating plan to your individual calorie needs. This information is not intended to replace advice given to you by your health care provider. Make sure you discuss any questions you have with your health care provider. Document Released: 10/19/2011 Document Revised: 10/23/2016 Document Reviewed: 10/23/2016 Elsevier  Interactive Patient Education  2019 [employer].

## 2019-03-06 ENCOUNTER — Other Ambulatory Visit: Payer: Medicare Other

## 2019-03-06 ENCOUNTER — Telehealth: Payer: Self-pay

## 2019-03-06 ENCOUNTER — Other Ambulatory Visit: Payer: Self-pay

## 2019-03-06 DIAGNOSIS — F331 Major depressive disorder, recurrent, moderate: Secondary | ICD-10-CM

## 2019-03-06 DIAGNOSIS — E785 Hyperlipidemia, unspecified: Secondary | ICD-10-CM | POA: Diagnosis not present

## 2019-03-06 DIAGNOSIS — E119 Type 2 diabetes mellitus without complications: Secondary | ICD-10-CM | POA: Diagnosis not present

## 2019-03-06 DIAGNOSIS — I10 Essential (primary) hypertension: Secondary | ICD-10-CM | POA: Diagnosis not present

## 2019-03-06 NOTE — Telephone Encounter (Signed)
Left message in regards to appt on 03/10/19.

## 2019-03-07 LAB — LIPID PANEL
Cholesterol: 104 mg/dL (ref <200)
HDL: 45 mg/dL — ABNORMAL LOW (ref >=50)
LDL Cholesterol (Calc): 43 mg/dL (calc)
Non-HDL Cholesterol (Calc): 59 mg/dL (calc) (ref <130)
Total CHOL/HDL Ratio: 2.3 (calc) (ref <5.0)
Triglycerides: 75 mg/dL (ref <150)

## 2019-03-07 LAB — COMPLETE METABOLIC PANEL WITH GFR
AG Ratio: 2 (calc) (ref 1.0–2.5)
ALT: 8 U/L (ref 6–29)
AST: 10 U/L (ref 10–35)
Albumin: 3.9 g/dL (ref 3.6–5.1)
Alkaline phosphatase (APISO): 81 U/L (ref 37–153)
BUN/Creatinine Ratio: 18 (calc) (ref 6–22)
BUN: 17 mg/dL (ref 7–25)
CO2: 25 mmol/L (ref 20–32)
Calcium: 9.2 mg/dL (ref 8.6–10.4)
Chloride: 105 mmol/L (ref 98–110)
Creat: 0.97 mg/dL — ABNORMAL HIGH (ref 0.60–0.93)
GFR, Est African American: 65 mL/min/{1.73_m2} (ref >=60)
GFR, Est Non African American: 56 mL/min/{1.73_m2} — ABNORMAL LOW (ref >=60)
Globulin: 2 g/dL (calc) (ref 1.9–3.7)
Glucose, Bld: 119 mg/dL — ABNORMAL HIGH (ref 65–99)
Potassium: 4.7 mmol/L (ref 3.5–5.3)
Sodium: 138 mmol/L (ref 135–146)
Total Bilirubin: 0.6 mg/dL (ref 0.2–1.2)
Total Protein: 5.9 g/dL — ABNORMAL LOW (ref 6.1–8.1)

## 2019-03-07 LAB — HEMOGLOBIN A1C
Hgb A1c MFr Bld: 6.6 % of total Hgb — ABNORMAL HIGH (ref <5.7)
Mean Plasma Glucose: 143 (calc)
eAG (mmol/L): 7.9 (calc)

## 2019-03-10 ENCOUNTER — Telehealth: Payer: Medicare Other | Admitting: Internal Medicine

## 2019-03-10 ENCOUNTER — Other Ambulatory Visit: Payer: Self-pay

## 2019-03-10 NOTE — Progress Notes (Signed)
Left VM for patient who did not answer.  Will need to reschedule visit.

## 2019-03-12 ENCOUNTER — Other Ambulatory Visit: Payer: Self-pay | Admitting: Nurse Practitioner

## 2019-03-12 DIAGNOSIS — F324 Major depressive disorder, single episode, in partial remission: Secondary | ICD-10-CM

## 2019-03-13 ENCOUNTER — Encounter: Payer: Self-pay | Admitting: Cardiology

## 2019-03-13 NOTE — Progress Notes (Signed)
Remote pacemaker transmission.   

## 2019-03-26 ENCOUNTER — Other Ambulatory Visit: Payer: Self-pay | Admitting: Nurse Practitioner

## 2019-03-26 DIAGNOSIS — F331 Major depressive disorder, recurrent, moderate: Secondary | ICD-10-CM

## 2019-03-30 ENCOUNTER — Other Ambulatory Visit: Payer: Self-pay | Admitting: Nurse Practitioner

## 2019-03-30 DIAGNOSIS — G245 Blepharospasm: Secondary | ICD-10-CM

## 2019-03-31 NOTE — Telephone Encounter (Signed)
Last filled 02/24/2019   [place] Database verified and compliance confirmed

## 2019-05-15 ENCOUNTER — Other Ambulatory Visit: Payer: Self-pay | Admitting: Nurse Practitioner

## 2019-05-15 DIAGNOSIS — E119 Type 2 diabetes mellitus without complications: Secondary | ICD-10-CM

## 2019-05-19 ENCOUNTER — Encounter: Payer: Self-pay | Admitting: Family

## 2019-05-19 ENCOUNTER — Other Ambulatory Visit: Payer: Self-pay

## 2019-05-19 ENCOUNTER — Ambulatory Visit (INDEPENDENT_AMBULATORY_CARE_PROVIDER_SITE_OTHER): Payer: Medicare Other | Admitting: Family

## 2019-05-19 ENCOUNTER — Ambulatory Visit: Payer: Self-pay

## 2019-05-19 DIAGNOSIS — Z0000 Encounter for general adult medical examination without abnormal findings: Secondary | ICD-10-CM | POA: Diagnosis not present

## 2019-05-19 DIAGNOSIS — Z1239 Encounter for other screening for malignant neoplasm of breast: Secondary | ICD-10-CM | POA: Diagnosis not present

## 2019-05-19 DIAGNOSIS — Z23 Encounter for immunization: Secondary | ICD-10-CM

## 2019-05-19 DIAGNOSIS — Z780 Asymptomatic menopausal state: Secondary | ICD-10-CM | POA: Diagnosis not present

## 2019-05-19 MED ORDER — TETANUS-DIPHTH-ACELL PERTUSSIS 5-2-15.5 LF-MCG/0.5 IM SUSP: 0.5000 mL | Freq: Once | INTRAMUSCULAR | 0 refills | Status: AC

## 2019-05-19 NOTE — Patient Instructions (Signed)
Mary Higgins , Thank you for taking time to come for your Medicare Wellness Visit. I appreciate your ongoing commitment to your health goals. Please review the following plan we discussed and let me know if I can assist you in the future.   Screening recommendations/referrals: Colonoscopy: Reschedule appointment with Gastroenterology  Mammogram: Ordered today  Bone Density: Ordered today  Recommended yearly ophthalmology/optometry visit for glaucoma screening and checkup Recommended yearly dental visit for hygiene and checkup  Vaccinations: Influenza vaccine:Up to date  Pneumococcal vaccine Up to date  Tdap vaccine: Ordered today  Shingles vaccine:Up to date   Advanced directives: None has information    Conditions/risks identified: Advance age female > 43 yrs,Type 2 DM,Hypertension,Hyperlidemia,Obesity   Next appointment: 1 year    Preventive Care 59 Years and Older, Female Preventive care refers to lifestyle choices and visits with your health care provider that can promote health and wellness. What does preventive care include?  A yearly physical exam. This is also called an annual well check.  Dental exams once or twice a year.  Routine eye exams. Ask your health care provider how often you should have your eyes checked.  Personal lifestyle choices, including:  Daily care of your teeth and gums.  Regular physical activity.  Eating a healthy diet.  Avoiding tobacco and drug use.  Limiting alcohol use.  Practicing safe sex.  Taking low-dose aspirin every day.  Taking vitamin and mineral supplements as recommended by your health care provider. What happens during an annual well check? The services and screenings done by your health care provider during your annual well check will depend on your age, overall health, lifestyle risk factors, and family history of disease. Counseling  Your health care provider may ask you questions about your:  Alcohol use.   Tobacco use.  Drug use.  Emotional well-being.  Home and relationship well-being.  Sexual activity.  Eating habits.  History of falls.  Memory and ability to understand (cognition).  Work and work Statistician.  Reproductive health. Screening  You may have the following tests or measurements:  Height, weight, and BMI.  Blood pressure.  Lipid and cholesterol levels. These may be checked every 5 years, or more frequently if you are over 1 years old.  Skin check.  Lung cancer screening. You may have this screening every year starting at age 77 if you have a 30-pack-year history of smoking and currently smoke or have quit within the past 15 years.  Fecal occult blood test (FOBT) of the stool. You may have this test every year starting at age 39.  Flexible sigmoidoscopy or colonoscopy. You may have a sigmoidoscopy every 5 years or a colonoscopy every 10 years starting at age 30.  Hepatitis C blood test.  Hepatitis B blood test.  Sexually transmitted disease (STD) testing.  Diabetes screening. This is done by checking your blood sugar (glucose) after you have not eaten for a while (fasting). You may have this done every 1-3 years.  Bone density scan. This is done to screen for osteoporosis. You may have this done starting at age 76.  Mammogram. This may be done every 1-2 years. Talk to your health care provider about how often you should have regular mammograms. Talk with your health care provider about your test results, treatment options, and if necessary, the need for more tests. Vaccines  Your health care provider may recommend certain vaccines, such as:  Influenza vaccine. This is recommended every year.  Tetanus, diphtheria, and acellular pertussis (  Tdap, Td) vaccine. You may need a Td booster every 10 years.  Zoster vaccine. You may need this after age 28.  Pneumococcal 13-valent conjugate (PCV13) vaccine. One dose is recommended after age 38.  Pneumococcal  polysaccharide (PPSV23) vaccine. One dose is recommended after age 70. Talk to your health care provider about which screenings and vaccines you need and how often you need them. This information is not intended to replace advice given to you by your health care provider. Make sure you discuss any questions you have with your health care provider. Document Released: 11/26/2015 Document Revised: 07/19/2016 Document Reviewed: 08/31/2015 Elsevier Interactive Patient Education  2017 [place] Prevention in the Home Falls can cause injuries. They can happen to people of all ages. There are many things you can do to make your home safe and to help prevent falls. What can I do on the outside of my home?  Regularly fix the edges of walkways and driveways and fix any cracks.  Remove anything that might make you trip as you walk through a door, such as a raised step or threshold.  Trim any bushes or trees on the path to your home.  Use bright outdoor lighting.  Clear any walking paths of anything that might make someone trip, such as rocks or tools.  Regularly check to see if handrails are loose or broken. Make sure that both sides of any steps have handrails.  Any raised decks and porches should have guardrails on the edges.  Have any leaves, snow, or ice cleared regularly.  Use sand or salt on walking paths during winter.  Clean up any spills in your garage right away. This includes oil or grease spills. What can I do in the bathroom?  Use night lights.  Install grab bars by the toilet and in the tub and shower. Do not use towel bars as grab bars.  Use non-skid mats or decals in the tub or shower.  If you need to sit down in the shower, use a plastic, non-slip stool.  Keep the floor dry. Clean up any water that spills on the floor as soon as it happens.  Remove soap buildup in the tub or shower regularly.  Attach bath mats securely with double-sided non-slip rug tape.   Do not have throw rugs and other things on the floor that can make you trip. What can I do in the bedroom?  Use night lights.  Make sure that you have a light by your bed that is easy to reach.  Do not use any sheets or blankets that are too big for your bed. They should not hang down onto the floor.  Have a firm chair that has side arms. You can use this for support while you get dressed.  Do not have throw rugs and other things on the floor that can make you trip. What can I do in the kitchen?  Clean up any spills right away.  Avoid walking on wet floors.  Keep items that you use a lot in easy-to-reach places.  If you need to reach something above you, use a strong step stool that has a grab bar.  Keep electrical cords out of the way.  Do not use floor polish or wax that makes floors slippery. If you must use wax, use non-skid floor wax.  Do not have throw rugs and other things on the floor that can make you trip. What can I do with my stairs?  Do  not leave any items on the stairs.  Make sure that there are handrails on both sides of the stairs and use them. Fix handrails that are broken or loose. Make sure that handrails are as long as the stairways.  Check any carpeting to make sure that it is firmly attached to the stairs. Fix any carpet that is loose or worn.  Avoid having throw rugs at the top or bottom of the stairs. If you do have throw rugs, attach them to the floor with carpet tape.  Make sure that you have a light switch at the top of the stairs and the bottom of the stairs. If you do not have them, ask someone to add them for you. What else can I do to help prevent falls?  Wear shoes that:  Do not have high heels.  Have rubber bottoms.  Are comfortable and fit you well.  Are closed at the toe. Do not wear sandals.  If you use a stepladder:  Make sure that it is fully opened. Do not climb a closed stepladder.  Make sure that both sides of the  stepladder are locked into place.  Ask someone to hold it for you, if possible.  Clearly mark and make sure that you can see:  Any grab bars or handrails.  First and last steps.  Where the edge of each step is.  Use tools that help you move around (mobility aids) if they are needed. These include:  Canes.  Walkers.  Scooters.  Crutches.  Turn on the lights when you go into a dark area. Replace any light bulbs as soon as they burn out.  Set up your furniture so you have a clear path. Avoid moving your furniture around.  If any of your floors are uneven, fix them.  If there are any pets around you, be aware of where they are.  Review your medicines with your doctor. Some medicines can make you feel dizzy. This can increase your chance of falling. Ask your doctor what other things that you can do to help prevent falls. This information is not intended to replace advice given to you by your health care provider. Make sure you discuss any questions you have with your health care provider. Document Released: 08/26/2009 Document Revised: 04/06/2016 Document Reviewed: 12/04/2014 Elsevier Interactive Patient Education  2017 [employer].

## 2019-05-19 NOTE — Progress Notes (Signed)
Subjective:   Mary Higgins is a 78 y.o. female who presents for Medicare Annual (Subsequent) preventive examination.  Review of Systems:   Cardiac Risk Factors include: advanced age (>57men, >67 women);diabetes mellitus;hypertension;dyslipidemia;obesity (BMI >30kg/m2)     Objective:     Vitals: LMP  (LMP Unknown)   There is no height or weight on file to calculate BMI.  Advanced Directives 05/19/2019 10/01/2018 06/01/2018 05/13/2018 05/13/2018 07/11/2017 02/07/2017  Does Patient Have a Medical Advance Directive? No No No No No No No  Does patient want to make changes to medical advance directive? - - No - Patient declined - Yes (MAU/Ambulatory/Procedural Areas - Information given) - -  Would patient like information on creating a medical advance directive? No - Patient declined Yes (Inpatient - patient requests chaplain consult to create a medical advance directive) No - Patient declined - Yes (MAU/Ambulatory/Procedural Areas - Information given) - -    Tobacco Social History   Tobacco Use  Smoking Status Current Some Day Smoker  . Packs/day: 0.50  . Years: 15.00  . Pack years: 7.50  . Types: Cigarettes  Smokeless Tobacco Never Used  Tobacco Comment   quit for 2 weeks as of 05/13/2018, has the occasional cigarrette     Ready to quit: Not Answered Counseling given: Not Answered Comment: quit for 2 weeks as of 05/13/2018, has the occasional cigarrette   Clinical Intake:  Pre-visit preparation completed: No  Pain : No/denies pain     BMI - recorded: 27.97(previous weight on last visit) Nutritional Status: BMI 25 -29 Overweight Diabetes: Yes CBG done?: No Did pt. bring in CBG monitor from home?: No(has checked CBG)  How often do you need to have someone help you when you read instructions, pamphlets, or other written materials from your doctor or pharmacy?: 3 - Sometimes(son assist with small print) What is the last grade level you completed in school?: 12 grade   Interpreter Needed?: No  Information entered by ::   FNP-C  Past Medical History:  Diagnosis Date  . Anxiety and depression    Hx of  . Blepharospasm    . Breast cancer ([place])    s/p lumpectomy  . Carotid bruit    Bilateral. Mild to moderate disease on LEFT and mild on the RIGHT in 2012  . COPD (chronic obstructive pulmonary disease) ([place])   . Diabetes mellitus without complication ([place])    AODM  . Fuchs' corneal dystrophy   . GERD (gastroesophageal reflux disease)    . Hyperlipidemia   . Hypertension    . Insomnia    . Intracerebral bleed ([place]) 2003   Remote intracerebral blled and coiling by Dr. Estanislado Pandy for cerebral aneurysm ans a shunt placed by Dr. Vertell Limber remotely. No seizure disorder and this happened in 2003 with an anterior communicating aneurysm.  Marland Kitchen Neurocardiogenic syncope    a. s/p Biotronik dual chamber pacemaker  . Personal history of radiation therapy   . Sinus node dysfunction (H[facility])    . Tobacco abuse    . Type II or unspecified type diabetes mellitus without mention of complication, not stated as uncontrolled     Past Surgical History:  Procedure Laterality Date  . ANEURYSM COILING  2003   Cerebral aneurysm ;Dr. Luanne Bras  . BREAST LUMPECTOMY Right    For cancer, no recurrence  . CHOLECYSTECTOMY    . PACEMAKER GENERATOR CHANGE N/A 09/25/2014   BTK dual chamber pacemkaer implanted by Dr Rayann Heman  . PACEMAKER INSERTION  07/12/2006  St. Jude Victory XL DR  -  Dr. Tami Ribas  . PARTIAL HYSTERECTOMY    . VENTRICULOPERITONEAL SHUNT  2003   Dr. Erline Levine   Family History  Problem Relation Age of Onset  . Cancer Mother   . Heart attack Father   . Hypertension Other    Social History   Socioeconomic History  . Marital status: Legally Separated    Spouse name: Not on file  . Number of children: Not on file  . Years of education: Not on file  . Highest education level: Not on file  Occupational History  . Not on file  Social Needs   . Financial resource strain: Not hard at all  . Food insecurity    Worry: Never true    Inability: Never true  . Transportation needs    Medical: No    Non-medical: No  Tobacco Use  . Smoking status: Current Some Day Smoker    Packs/day: 0.50    Years: 15.00    Pack years: 7.50    Types: Cigarettes  . Smokeless tobacco: Never Used  . Tobacco comment: quit for 2 weeks as of 05/13/2018, has the occasional cigarrette  Substance and Sexual Activity  . Alcohol use: No    Alcohol/week: 0.0 standard drinks  . Drug use: No  . Sexual activity: Not Currently  Lifestyle  . Physical activity    Days per week: 0 days    Minutes per session: 0 min  . Stress: Only a little  Relationships  . Social connections    Talks on phone: More than three times a week    Gets together: More than three times a week    Attends religious service: Never    Active member of club or organization: No    Attends meetings of clubs or organizations: Never    Relationship status: Separated  Other Topics Concern  . Not on file  Social History Narrative  . Not on file    Outpatient Encounter Medications as of 05/19/2019  Medication Sig  . aspirin EC 325 MG tablet Take 325 mg by mouth daily.  Marland Kitchen atorvastatin (LIPITOR) 40 MG tablet Take 1 tablet (40 mg total) by mouth daily. Please keep upcoming appt for future refills. Thank you  . buPROPion (WELLBUTRIN SR) 150 MG 12 hr tablet TAKE 1 TABLET BY MOUTH TWICE DAILY  . carboxymethylcellulose (REFRESH PLUS) 0.5 % SOLN Place 1 drop into both eyes as needed.  . clonazePAM (KLONOPIN) 1 MG tablet TAKE 1 TABLET BY MOUTH THREE TIMES DAILY AS NEEDED  . losartan (COZAAR) 100 MG tablet TAKE 1 TABLET BY MOUTH DAILY  . metFORMIN (GLUCOPHAGE) 500 MG tablet TAKE 1 TABLET BY MOUTH WITH BREAKFAST AND WITH SUPPER FOR DIABETES  . metoprolol tartrate (LOPRESSOR) 50 MG tablet TAKE 1 TABLET BY MOUTH TWICE DAILY  . pantoprazole (PROTONIX) 40 MG tablet TAKE 1 TABLET BY MOUTH TWICE DAILY   . Polyethyl Glycol-Propyl Glycol (SYSTANE) 0.4-0.3 % SOLN Apply to eye as needed.  . prednisoLONE acetate (PRED FORTE) 1 % ophthalmic suspension 1 drop in both eyes twice daily  . sertraline (ZOLOFT) 50 MG tablet TAKE 1/2 TABLET BY MOUTH DAILY FOR 2 WEEKS. INCREASE TO 1 TABLET DAILY  . Tdap (ADACEL) 03-14-14.5 LF-MCG/0.5 injection Inject 0.5 mLs into the muscle once for 1 dose.  . tetrahydrozoline (VISINE) 0.05 % ophthalmic solution as needed.  . [DISCONTINUED] Tdap (ADACEL) 03-14-14.5 LF-MCG/0.5 injection Inject 0.5 mLs into the muscle once.   No facility-administered encounter  medications on file as of 05/19/2019.     Activities of Daily Living In your present state of health, do you have any difficulty performing the following activities: 05/19/2019  Hearing? N  Vision? N  Difficulty concentrating or making decisions? Y  Comment Remembering at times  Walking or climbing stairs? Y  Comment " wears me out "  Dressing or bathing? N  Doing errands, shopping? Y  Comment son assist with driving  Preparing Food and eating ? Y  Comment son cooks  Using the Toilet? N  In the past six months, have you accidently leaked urine? Y  Do you have problems with loss of bowel control? Y  Managing your Medications? Y  Comment son medication  Managing your Finances? Y  Comment son assist  Housekeeping or managing your Housekeeping? Y  Comment son assist  Some recent data might be hidden    Patient Care Team: [name], NP as PCP - General (Geriatric Medicine) Patsey Berthold, NP as Nurse Practitioner (Cardiology) Darleen Crocker, MD as Consulting Physician (Ophthalmology) Thompson Grayer, MD as Consulting Physician (Cardiology) Otelia Sergeant, OD as Referring Physician (Ophthalmology)    Assessment:   This is a routine wellness examination for [place].  Exercise Activities and Dietary recommendations Current Exercise Habits: Home exercise routine, Type of exercise: walking, Time  (Minutes): 25, Frequency (Times/Week): 3, Weekly Exercise (Minutes/Week): 75, Intensity: Moderate, Exercise limited by: None identified  Goals    . Weight (lb) < 200 lb (90.7 kg)     I need to loss weight about 10 lbs by walking more.        Fall Risk Fall Risk  05/19/2019 03/05/2019 02/17/2019 10/01/2018 05/13/2018  Falls in the past year? 0 1 1 1  Yes  Number falls in past yr: 0 1 1 1 2  or more  Comment - - - - -  Injury with Fall? 0 0 0 0 No  Risk for fall due to : - - History of fall(s) - -   Is the patient's home free of loose throw rugs in walkways, pet beds, electrical cords, etc?   yes      Grab bars in the bathroom? yes      Handrails on the stairs?   yes      Adequate lighting?   yes  Depression Screen PHQ 2/9 Scores 05/19/2019 05/13/2018 07/11/2017 03/14/2016  PHQ - 2 Score 0 2 6 6   PHQ- 9 Score - 6 16 16      Cognitive Function MMSE - Mini Mental State Exam 05/13/2018 07/11/2017 03/14/2016  Orientation to time 5 3 4   Orientation to Place 5 4 4   Registration 3 3 3   Attention/ Calculation 5 5 5   Recall 2 2 2   Language- name 2 objects 2 2 2   Language- repeat 1 1 1   Language- follow 3 step command 3 3 3   Language- read & follow direction 1 1 1   Write a sentence 1 1 1   Copy design 1 1 1   Total score 29 26 27      6CIT Screen 05/19/2019  What Year? 0 points  What month? 0 points  What time? 0 points  Count back from 20 0 points  Months in reverse 0 points  Repeat phrase 4 points  Total Score 4    Immunization History  Administered Date(s) Administered  . Influenza, High Dose Seasonal PF 07/11/2017, 10/01/2018  . Influenza,inj,Quad PF,6+ Mos 08/18/2015, 12/13/2016  . Pneumococcal Conjugate-13 08/18/2015  . Pneumococcal Polysaccharide-23  07/11/2017  . Zoster 03/16/2015  . Zoster Recombinat (Shingrix) 08/21/2017, 02/05/2018    Qualifies for Shingles Vaccine? Up to date   Screening Tests Health Maintenance  Topic Date Due  . TETANUS/TDAP  04/27/1960  . INFLUENZA  VACCINE  06/14/2019  . HEMOGLOBIN A1C  09/05/2019  . FOOT EXAM  10/02/2019  . OPHTHALMOLOGY EXAM  11/22/2019  . DEXA SCAN  Completed  . PNA vac Low Risk Adult  Completed    Cancer Screenings: Lung: Low Dose CT Chest recommended if Age 67-80 years, 30 pack-year currently smoking OR have quit w/in 15years. Patient does not qualify. Breast:  Up to date on Mammogram? Yes   Up to date of Bone Density/Dexa? Yes Colorectal: Previous appointment cancelled due to COVID-19 restrictions.Patient will call to reschedule appoitment.   Additional Screenings:  Hepatitis C Screening: low Risk   Plan:   - Tdab vaccine ordered this visit  - Mammogram  - Dexa SCan   I have personally reviewed and noted the following in the patient's chart:   . Medical and social history . Use of alcohol, tobacco or illicit drugs  . Current medications and supplements . Functional ability and status . Nutritional status . Physical activity . Advanced directives . List of other physicians . Hospitalizations, surgeries, and ER visits in previous 12 months . Vitals . Screenings to include cognitive, depression, and falls . Referrals and appointments  In addition, I have reviewed and discussed with patient certain preventive protocols, quality metrics, and best practice recommendations. A written personalized care plan for preventive services as well as general preventive health recommendations were provided to patient.  Sandrea Hughs, NP  05/19/2019

## 2019-05-19 NOTE — Progress Notes (Signed)
This service is provided via telemedicine  No vital signs collected/recorded due to the encounter was a telemedicine visit.   Location of patient (ex: home, work): Home   Patient consents to a telephone visit: Yes   Location of the provider (ex: office, home):  Office   Name of any referring provider:  Sherrie Mustache, NP   Names of all persons participating in the telemedicine service and their role in the encounter:  Ruthell Rummage [place], [place] NP, Candie Chroman   Time spent on call: Ruthell Rummage CMA, spent 11 Minutes on phone with patient.

## 2019-05-28 ENCOUNTER — Other Ambulatory Visit: Payer: Self-pay | Admitting: Nurse Practitioner

## 2019-05-28 DIAGNOSIS — G245 Blepharospasm: Secondary | ICD-10-CM

## 2019-05-30 ENCOUNTER — Other Ambulatory Visit: Payer: Self-pay | Admitting: *Deleted

## 2019-05-30 DIAGNOSIS — G245 Blepharospasm: Secondary | ICD-10-CM

## 2019-05-30 MED ORDER — CLONAZEPAM 1 MG PO TABS: 1.0000 mg | ORAL_TABLET | Freq: Three times a day (TID) | ORAL | 1 refills | Status: DC | PRN

## 2019-05-30 NOTE — Telephone Encounter (Signed)
Patient requested refill. Phoned to pharmacy.  

## 2019-06-04 ENCOUNTER — Ambulatory Visit (INDEPENDENT_AMBULATORY_CARE_PROVIDER_SITE_OTHER): Payer: Medicare Other | Admitting: *Deleted

## 2019-06-04 DIAGNOSIS — I495 Sick sinus syndrome: Secondary | ICD-10-CM | POA: Diagnosis not present

## 2019-06-04 DIAGNOSIS — R55 Syncope and collapse: Secondary | ICD-10-CM

## 2019-06-04 LAB — CUP PACEART REMOTE DEVICE CHECK
Date Time Interrogation Session: 20200722121731
Implantable Lead Implant Date: 20070830
Implantable Lead Implant Date: 20070830
Implantable Lead Location: 753859
Implantable Lead Location: 753860
Implantable Pulse Generator Implant Date: 20151113
Pulse Gen Model: 394931
Pulse Gen Serial Number: 68408118

## 2019-06-09 ENCOUNTER — Telehealth: Payer: Self-pay | Admitting: Nurse Practitioner

## 2019-06-09 NOTE — Telephone Encounter (Signed)
Scheduled a Televisit with Jessica on 06/11/19

## 2019-06-09 NOTE — Telephone Encounter (Signed)
Patient's son [name] called.  He stated his mother has not been out in about six months.  He states that she is depressed and needs new anti-depressant.  He didn't want to make an appointment.  He wants to see what Janett Billow advises.

## 2019-06-11 ENCOUNTER — Ambulatory Visit (INDEPENDENT_AMBULATORY_CARE_PROVIDER_SITE_OTHER): Payer: Medicare Other | Admitting: Nurse Practitioner

## 2019-06-11 ENCOUNTER — Encounter: Payer: Self-pay | Admitting: Nurse Practitioner

## 2019-06-11 ENCOUNTER — Other Ambulatory Visit: Payer: Self-pay

## 2019-06-11 DIAGNOSIS — F331 Major depressive disorder, recurrent, moderate: Secondary | ICD-10-CM | POA: Diagnosis not present

## 2019-06-11 MED ORDER — SERTRALINE HCL 50 MG PO TABS: 50.0000 mg | ORAL_TABLET | Freq: Every day | ORAL | 5 refills | Status: DC

## 2019-06-11 NOTE — Patient Instructions (Addendum)
At this time a therapist and a psychiatrist (if the therapist does not help you or you need more help) would benefit you to help control your depression   Continue on Wellbutrin and Sertraline  Would like you to try to increase physical activity daily 15 mins daily.   Sit outside for 30 mins and relaxation  To start a gratitude journal- write down 5 things daily before bed or first thing in the morning that you are thankful for.   1 month for routine follow up.

## 2019-06-11 NOTE — Progress Notes (Signed)
This service is provided via telemedicine  No vital signs collected/recorded due to the encounter was a telemedicine visit.   Location of patient (ex: home, work): Home  Patient consents to a telephone visit:  Yes  Location of the provider (ex: office, home):  [facility], Office   Name of any referring provider:  N/A  Names of all persons participating in the telemedicine service and their role in the encounter:  S.Chrae B/CMA, Sherrie Mustache, NP, Joey (son)and Patient   Time spent on call:  11 min with medical assistant    Careteam: Patient Care Team: [name], NP as PCP - General (Geriatric Medicine) Patsey Berthold, NP as Nurse Practitioner (Cardiology) Darleen Crocker, MD as Consulting Physician (Ophthalmology) Thompson Grayer, MD as Consulting Physician (Cardiology) Otelia Sergeant, OD as Referring Physician (Ophthalmology)  Advanced Directive information    Allergies  Allergen Reactions  . Oxaprozin Rash    Chief Complaint  Patient presents with  . Acute Visit    Patient states her son is insisting that patient needs an antidepressant, patient agrees. Moderate fall risk. Telephone visit      HPI: Patient is a 78 y.o. female due to concerns from son.  Son reports severe depression. Has not been out of the house for 8 months (prior to [place]). Not talking to him. Sleeps all the time.   Currently taking Wellbutrin 150 mg by mouth twice daily and she was started on Zoloft 25 mg daily in April with titration to 50 mg after 2 weeks. Son reports she is taking the 50 mg tablet and has been since April.   Pt reports she has good days and bad days. States she is by herself so much which is part of the problem. Son lives with her but he works so he is not there much.  Reports she walks around the yard a little bit but no other activity Reports she eats good.  No thought of HI or SI.  Reports her depression is mild.  Feels like she could use a little  more help with controlling symptoms of depression- some days she does not even want to get out of bed. Her son fusses at her because she wont get out of bed and that is hard.    Review of Systems:  Review of Systems  Constitutional: Positive for malaise/fatigue. Negative for chills, fever and weight loss.  Psychiatric/Behavioral: Positive for depression. Negative for hallucinations, memory loss, substance abuse and suicidal ideas. The patient is not nervous/anxious and does not have insomnia.     Past Medical History:  Diagnosis Date  . Anxiety and depression    Hx of  . Blepharospasm    . Breast cancer ([place])    s/p lumpectomy  . Carotid bruit    Bilateral. Mild to moderate disease on LEFT and mild on the RIGHT in 2012  . COPD (chronic obstructive pulmonary disease) ([place])   . Diabetes mellitus without complication ([place])    AODM  . Fuchs' corneal dystrophy   . GERD (gastroesophageal reflux disease)    . Hyperlipidemia   . Hypertension    . Insomnia    . Intracerebral bleed ([place]) 2003   Remote intracerebral blled and coiling by Dr. Estanislado Pandy for cerebral aneurysm ans a shunt placed by Dr. Vertell Limber remotely. No seizure disorder and this happened in 2003 with an anterior communicating aneurysm.  Marland Kitchen Neurocardiogenic syncope    a. s/p Biotronik dual chamber pacemaker  . Personal  history of radiation therapy   . Sinus node dysfunction (HCC)    . Tobacco abuse    . Type II or unspecified type diabetes mellitus without mention of complication, not stated as uncontrolled     Past Surgical History:  Procedure Laterality Date  . ANEURYSM COILING  2003   Cerebral aneurysm ;Dr. Luanne Bras  . BREAST LUMPECTOMY Right    For cancer, no recurrence  . CHOLECYSTECTOMY    . PACEMAKER GENERATOR CHANGE N/A 09/25/2014   BTK dual chamber pacemkaer implanted by Dr Rayann Heman  . PACEMAKER INSERTION  07/12/2006   St. Jude Victory XL DR  -  Dr. Tami Ribas  . PARTIAL HYSTERECTOMY    .  VENTRICULOPERITONEAL SHUNT  2003   Dr. Erline Levine   Social History:   reports that she has been smoking cigarettes. She has a 7.50 pack-year smoking history. She has never used smokeless tobacco. She reports that she does not drink alcohol or use drugs.  Family History  Problem Relation Age of Onset  . Cancer Mother   . Heart attack Father   . Heart attack Son   . Hypertension Other     Medications: Patient's Medications  New Prescriptions   No medications on file  Previous Medications   ASPIRIN EC 325 MG TABLET    Take 325 mg by mouth daily.   ATORVASTATIN (LIPITOR) 40 MG TABLET    Take 1 tablet (40 mg total) by mouth daily. Please keep upcoming appt for future refills. Thank you   BUPROPION (WELLBUTRIN SR) 150 MG 12 HR TABLET    TAKE 1 TABLET BY MOUTH TWICE DAILY   CARBOXYMETHYLCELLULOSE (REFRESH PLUS) 0.5 % SOLN    Place 1 drop into both eyes as needed.   CLONAZEPAM (KLONOPIN) 1 MG TABLET    Take 1 tablet (1 mg total) by mouth 3 (three) times daily as needed.   LOSARTAN (COZAAR) 100 MG TABLET    TAKE 1 TABLET BY MOUTH DAILY   METFORMIN (GLUCOPHAGE) 500 MG TABLET    TAKE 1 TABLET BY MOUTH WITH BREAKFAST AND WITH SUPPER FOR DIABETES   METOPROLOL TARTRATE (LOPRESSOR) 50 MG TABLET    TAKE 1 TABLET BY MOUTH TWICE DAILY   PANTOPRAZOLE (PROTONIX) 40 MG TABLET    TAKE 1 TABLET BY MOUTH TWICE DAILY   POLYETHYL GLYCOL-PROPYL GLYCOL (SYSTANE) 0.4-0.3 % SOLN    Apply to eye as needed.   SERTRALINE (ZOLOFT) 50 MG TABLET    TAKE 1/2 TABLET BY MOUTH DAILY FOR 2 WEEKS. INCREASE TO 1 TABLET DAILY   TETRAHYDROZOLINE (VISINE) 0.05 % OPHTHALMIC SOLUTION    as needed.  Modified Medications   No medications on file  Discontinued Medications   PREDNISOLONE ACETATE (PRED FORTE) 1 % OPHTHALMIC SUSPENSION    1 drop in both eyes twice daily    Physical Exam:  There were no vitals filed for this visit. There is no height or weight on file to calculate BMI. Wt Readings from Last 3 Encounters:   10/01/18 189 lb 6.4 oz (85.9 kg)  06/01/18 183 lb (83 kg)  05/13/18 183 lb (83 kg)    Labs reviewed: Basic Metabolic Panel: Recent Labs    10/01/18 1520 12/19/18 1516 03/06/19 0838  NA 139  --  138  K 4.8  --  4.7  CL 102  --  105  CO2 28  --  25  GLUCOSE 103  --  119*  BUN 18  --  17  CREATININE 0.84 0.80 0.97*  CALCIUM 9.8  --  9.2  TSH 1.74  --   --    Liver Function Tests: Recent Labs    03/06/19 0838  AST 10  ALT 8  BILITOT 0.6  PROT 5.9*   No results for input(s): LIPASE, AMYLASE in the last 8760 hours. No results for input(s): AMMONIA in the last 8760 hours. CBC: Recent Labs    10/01/18 1520  WBC 7.0  NEUTROABS 4,634  HGB 12.3  HCT 37.0  MCV 84.9  PLT 305   Lipid Panel: Recent Labs    03/06/19 0838  CHOL 104  HDL 45*  LDLCALC 43  TRIG 75  CHOLHDL 2.3   TSH: Recent Labs    10/01/18 1520  TSH 1.74   A1C: Lab Results  Component Value Date   HGBA1C 6.6 (H) 03/06/2019     Assessment/Plan 1. Moderate episode of recurrent major depressive disorder ([place]) -discuss lifestyle modifications to help with depression also encouraged to make appt with therapist and psychiatrist  -continues on Wellbutrin and zoloft.  - sertraline (ZOLOFT) 50 MG tablet; Take 1 tablet (50 mg total) by mouth daily.  Dispense: 30 tablet; Refill: 5  Next appt: 1 month Jessica K. Harle Battiest  [facility] [phone]    Virtual Visit via Telephone Note  I connected with@ on 06/11/19 at  2:15 PM EDT by telephone and verified that I am speaking with the correct person using two identifiers.  Location: Patient: home Provider: office   I discussed the limitations, risks, security and privacy concerns of performing an evaluation and management service by telephone and the availability of in person appointments. I also discussed with the patient that there may be a patient responsible charge related to this service. The patient expressed  understanding and agreed to proceed.   I discussed the assessment and treatment plan with the patient. The patient was provided an opportunity to ask questions and all were answered. The patient agreed with the plan and demonstrated an understanding of the instructions.   The patient was advised to call back or seek an in-person evaluation if the symptoms worsen or if the condition fails to improve as anticipated.  I provided 14 minutes of non-face-to-face time during this encounter.  Carlos American. Harle Battiest Avs printed and mailed

## 2019-06-18 NOTE — Progress Notes (Signed)
Remote pacemaker transmission.   

## 2019-07-11 ENCOUNTER — Other Ambulatory Visit: Payer: Self-pay

## 2019-07-11 ENCOUNTER — Ambulatory Visit (INDEPENDENT_AMBULATORY_CARE_PROVIDER_SITE_OTHER): Payer: Medicare Other | Admitting: Nurse Practitioner

## 2019-07-11 DIAGNOSIS — K219 Gastro-esophageal reflux disease without esophagitis: Secondary | ICD-10-CM | POA: Diagnosis not present

## 2019-07-11 DIAGNOSIS — I609 Nontraumatic subarachnoid hemorrhage, unspecified: Secondary | ICD-10-CM | POA: Diagnosis not present

## 2019-07-11 DIAGNOSIS — F331 Major depressive disorder, recurrent, moderate: Secondary | ICD-10-CM | POA: Diagnosis not present

## 2019-07-11 DIAGNOSIS — I10 Essential (primary) hypertension: Secondary | ICD-10-CM | POA: Diagnosis not present

## 2019-07-11 DIAGNOSIS — G245 Blepharospasm: Secondary | ICD-10-CM

## 2019-07-11 DIAGNOSIS — E785 Hyperlipidemia, unspecified: Secondary | ICD-10-CM

## 2019-07-11 DIAGNOSIS — E119 Type 2 diabetes mellitus without complications: Secondary | ICD-10-CM

## 2019-07-11 NOTE — Progress Notes (Signed)
This service is provided via telemedicine  No vital signs collected/recorded due to the encounter was a telemedicine visit.   Location of patient (ex: home, work):  Home  Patient consents to a telephone visit:  Yes  Location of the provider (ex: office, home): Office   Name of any referring provider:  Dewaine Oats  Names of all persons participating in the telemedicine service and their role in the encounter:  Candie Chroman, Sherrie Mustache, NP, Bonney Leitz, [place]  Time spent on call:  5.14 minutes     Careteam: Patient Care Team: [name], NP as PCP - General (Geriatric Medicine) Patsey Berthold, NP as Nurse Practitioner (Cardiology) Darleen Crocker, MD as Consulting Physician (Ophthalmology) Thompson Grayer, MD as Consulting Physician (Cardiology) Otelia Sergeant, OD as Referring Physician (Ophthalmology)  Advanced Directive information    Allergies  Allergen Reactions   Oxaprozin Rash    Chief Complaint  Patient presents with   Medical Management of Chronic Issues     1 month follow-up. Televisit     HPI: Patient is a 78 y.o. female to follow up on depression via tele-visit.  Continues on Zoloft and Wellbutrin. Did not reach out to counselor. Her son has been very sick over the last  2 weeks.  She received the information on therapist but did not reach out because she does not have transportation.  Has been walking more and getting outside.  This has helped mood.  Not thoughts of HI or SI Has started to journal but has not kept up with it.   htn- does not remember what readings were at home.   Dm- A1c 6.6 in April; does not check blood sugar. No low blood sugars noted. Continues on metformin twice daily   Hyperlipidemia- continues on lipitor LDL at goal in April  Blepharospasm- controlled on clonazepam; unable to cut back on dose or eye will really bother her.   GERD- symptoms controlled on protonix, will have occasional symptoms at night.    Subarachnoid hemorrhage- has followed up with neurosurgery and had CT. Continues to have headache but have not followed up due to her son being sick.  Not able to drive- did not pass eye exam.   Review of Systems:  Review of Systems  Constitutional: Negative for chills, fever and weight loss.  HENT: Positive for hearing loss.   Respiratory: Negative for cough, sputum production and shortness of breath.        Smoker  Cardiovascular: Negative for chest pain, palpitations and leg swelling.  Gastrointestinal: Positive for heartburn (controlled on protonix- occasional break through). Negative for abdominal pain, constipation and diarrhea.  Genitourinary: Negative for dysuria, frequency and urgency.       Incontinence of urine; wears pads  Musculoskeletal: Negative for back pain, falls, joint pain and myalgias.  Skin: Negative.   Neurological: Positive for headaches (chronic hx of headaches, has not followed with neurosurgery recently). Negative for dizziness.  Psychiatric/Behavioral: Positive for depression (ongoing) and memory loss. The patient is not nervous/anxious and does not have insomnia.     Past Medical History:  Diagnosis Date   Anxiety and depression    Hx of   Blepharospasm     Breast cancer ([place])    s/p lumpectomy   Carotid bruit    Bilateral. Mild to moderate disease on LEFT and mild on the RIGHT in 2012   COPD (chronic obstructive pulmonary disease) (HCC)    Diabetes mellitus without complication ([place])    AODM  Fuchs' corneal dystrophy    GERD (gastroesophageal reflux disease)     Hyperlipidemia    Hypertension     Insomnia     Intracerebral bleed ([place]) 2003   Remote intracerebral blled and coiling by Dr. Estanislado Pandy for cerebral aneurysm ans a shunt placed by Dr. Vertell Limber remotely. No seizure disorder and this happened in 2003 with an anterior communicating aneurysm.   Neurocardiogenic syncope    a. s/p Biotronik dual chamber pacemaker   Personal  history of radiation therapy    Sinus node dysfunction (HCC)     Tobacco abuse     Type II or unspecified type diabetes mellitus without mention of complication, not stated as uncontrolled     Past Surgical History:  Procedure Laterality Date   ANEURYSM COILING  2003   Cerebral aneurysm ;Dr. Luanne Bras   BREAST LUMPECTOMY Right    For cancer, no recurrence   CHOLECYSTECTOMY     PACEMAKER GENERATOR CHANGE N/A 09/25/2014   BTK dual chamber pacemkaer implanted by Dr Rayann Heman   PACEMAKER INSERTION  07/12/2006   [employer] Victory XL DR  -  Dr. Tami Ribas   PARTIAL HYSTERECTOMY     VENTRICULOPERITONEAL SHUNT  2003   Dr. Erline Levine   Social History:   reports that she has been smoking cigarettes. She has a 7.50 pack-year smoking history. She has never used smokeless tobacco. She reports that she does not drink alcohol or use drugs.  Family History  Problem Relation Age of Onset   Cancer Mother    Heart attack Father    Heart attack Son    Hypertension Other     Medications: Patient's Medications  New Prescriptions   No medications on file  Previous Medications   ASPIRIN EC 325 MG TABLET    Take 325 mg by mouth daily.   ATORVASTATIN (LIPITOR) 40 MG TABLET    Take 1 tablet (40 mg total) by mouth daily. Please keep upcoming appt for future refills. Thank you   BUPROPION (WELLBUTRIN SR) 150 MG 12 HR TABLET    TAKE 1 TABLET BY MOUTH TWICE DAILY   CARBOXYMETHYLCELLULOSE (REFRESH PLUS) 0.5 % SOLN    Place 1 drop into both eyes as needed.   CLONAZEPAM (KLONOPIN) 1 MG TABLET    Take 1 tablet (1 mg total) by mouth 3 (three) times daily as needed.   LOSARTAN (COZAAR) 100 MG TABLET    TAKE 1 TABLET BY MOUTH DAILY   METFORMIN (GLUCOPHAGE) 500 MG TABLET    TAKE 1 TABLET BY MOUTH WITH BREAKFAST AND WITH SUPPER FOR DIABETES   METOPROLOL TARTRATE (LOPRESSOR) 50 MG TABLET    TAKE 1 TABLET BY MOUTH TWICE DAILY   PANTOPRAZOLE (PROTONIX) 40 MG TABLET    TAKE 1 TABLET BY MOUTH TWICE  DAILY   POLYETHYL GLYCOL-PROPYL GLYCOL (SYSTANE) 0.4-0.3 % SOLN    Apply to eye as needed.   SERTRALINE (ZOLOFT) 50 MG TABLET    Take 1 tablet (50 mg total) by mouth daily.   TETRAHYDROZOLINE (VISINE) 0.05 % OPHTHALMIC SOLUTION    as needed.  Modified Medications   No medications on file  Discontinued Medications   No medications on file    Physical Exam:  There were no vitals filed for this visit. There is no height or weight on file to calculate BMI. Wt Readings from Last 3 Encounters:  10/01/18 189 lb 6.4 oz (85.9 kg)  06/01/18 183 lb (83 kg)  05/13/18 183 lb (83 kg)  Labs reviewed: Basic Metabolic Panel: Recent Labs    10/01/18 1520 12/19/18 1516 03/06/19 0838  NA 139  --  138  K 4.8  --  4.7  CL 102  --  105  CO2 28  --  25  GLUCOSE 103  --  119*  BUN 18  --  17  CREATININE 0.84 0.80 0.97*  CALCIUM 9.8  --  9.2  TSH 1.74  --   --    Liver Function Tests: Recent Labs    03/06/19 0838  AST 10  ALT 8  BILITOT 0.6  PROT 5.9*   No results for input(s): LIPASE, AMYLASE in the last 8760 hours. No results for input(s): AMMONIA in the last 8760 hours. CBC: Recent Labs    10/01/18 1520  WBC 7.0  NEUTROABS 4,634  HGB 12.3  HCT 37.0  MCV 84.9  PLT 305   Lipid Panel: Recent Labs    03/06/19 0838  CHOL 104  HDL 45*  LDLCALC 43  TRIG 75  CHOLHDL 2.3   TSH: Recent Labs    10/01/18 1520  TSH 1.74   A1C: Lab Results  Component Value Date   HGBA1C 6.6 (H) 03/06/2019     Assessment/Plan 1. Moderate episode of recurrent major depressive disorder ([place]) Ongoing, reports this is some better, encouraged to continue medication and lifestyle modifications.  2. Essential hypertension -does not recall home reading but states it was not elevated. Will continue current regimen, goal <140/90.   3. Gastroesophageal reflux disease without esophagitis Stable on Protonix 40 mg daily, occasional break through symptoms. Encouraged dietary and lifestyle  modifications.   4. Subarachnoid hemorrhage ([place]) Saw neurosurgery last year but does not appear that she got her follow up MRI, encouraged follow up  5. Type 2 diabetes mellitus without complication, without long-term current use of insulin (HCC) -continue with dietary modification and metformin 500 mg daily with lifestyle modifications. Encouraged dietary compliance, routine foot care/monitoring and to keep up with diabetic eye exams through ophthalmology   6. Blepharospasm -stable on clonazepam 1 mg TID, has tried dose reduction and not able to tolerate due to increase in spasm  7. Hyperlipidemia, unspecified hyperlipidemia type LDL at goal on last lab, continues on Lipitor 40 mg daily  Next appt: 4 months  Heru Montz K. Harle Battiest  [facility] [phone]    Virtual Visit via Telephone Note  I connected with pt on 07/11/19 at  2:15 PM EDT by telephone and verified that I am speaking with the correct person using two identifiers.  Location: Patient: home Provider: office   I discussed the limitations, risks, security and privacy concerns of performing an evaluation and management service by telephone and the availability of in person appointments. I also discussed with the patient that there may be a patient responsible charge related to this service. The patient expressed understanding and agreed to proceed.   I discussed the assessment and treatment plan with the patient. The patient was provided an opportunity to ask questions and all were answered. The patient agreed with the plan and demonstrated an understanding of the instructions.   The patient was advised to call back or seek an in-person evaluation if the symptoms worsen or if the condition fails to improve as anticipated.  I provided 21 minutes of non-face-to-face time during this encounter.  [employer]. Harle Battiest Avs printed and mailed

## 2019-07-11 NOTE — Patient Instructions (Addendum)
To make appt with therapist or counselor in regards to depression- they will most likely offer a telephone visit  To make follow up with neurosurgery- does not look like you ever got your MRI of the brain done [phone]   Gastroesophageal Reflux Disease, Adult Gastroesophageal reflux (GER) happens when acid from the stomach flows up into the tube that connects the mouth and the stomach (esophagus). Normally, food travels down the esophagus and stays in the stomach to be digested. With GER, food and stomach acid sometimes move back up into the esophagus. You may have a disease called gastroesophageal reflux disease (GERD) if the reflux:  Happens often.  Causes frequent or very bad symptoms.  Causes problems such as damage to the esophagus. When this happens, the esophagus becomes sore and swollen (inflamed). Over time, GERD can make small holes (ulcers) in the lining of the esophagus. What are the causes? This condition is caused by a problem with the muscle between the esophagus and the stomach. When this muscle is weak or not normal, it does not close properly to keep food and acid from coming back up from the stomach. The muscle can be weak because of:  Tobacco use.  Pregnancy.  Having a certain type of hernia (hiatal hernia).  Alcohol use.  Certain foods and drinks, such as coffee, chocolate, onions, and peppermint. What increases the risk? You are more likely to develop this condition if you:  Are overweight.  Have a disease that affects your connective tissue.  Use NSAID medicines. What are the signs or symptoms? Symptoms of this condition include:  Heartburn.  Difficult or painful swallowing.  The feeling of having a lump in the throat.  A bitter taste in the mouth.  Bad breath.  Having a lot of saliva.  Having an upset or bloated stomach.  Belching.  Chest pain. Different conditions can cause chest pain. Make sure you see your doctor if you have chest  pain.  Shortness of breath or noisy breathing (wheezing).  Ongoing (chronic) cough or a cough at night.  Wearing away of the surface of teeth (tooth enamel).  Weight loss. How is this treated? Treatment will depend on how bad your symptoms are. Your doctor may suggest:  Changes to your diet.  Medicine.  Surgery. Follow these instructions at home: Eating and drinking   Follow a diet as told by your doctor. You may need to avoid foods and drinks such as: ? Coffee and tea (with or without caffeine). ? Drinks that contain alcohol. ? Energy drinks and sports drinks. ? Bubbly (carbonated) drinks or sodas. ? Chocolate and cocoa. ? Peppermint and mint flavorings. ? Garlic and onions. ? Horseradish. ? Spicy and acidic foods. These include peppers, chili powder, curry powder, vinegar, hot sauces, and BBQ sauce. ? Citrus fruit juices and citrus fruits, such as oranges, lemons, and limes. ? Tomato-based foods. These include red sauce, chili, salsa, and pizza with red sauce. ? Fried and fatty foods. These include donuts, french fries, potato chips, and high-fat dressings. ? High-fat meats. These include hot dogs, rib eye steak, sausage, ham, and bacon. ? High-fat dairy items, such as whole milk, butter, and cream cheese.  Eat small meals often. Avoid eating large meals.  Avoid drinking large amounts of liquid with your meals.  Avoid eating meals during the 2-3 hours before bedtime.  Avoid lying down right after you eat.  Do not exercise right after you eat. Lifestyle   Do not use any products that  contain nicotine or tobacco. These include cigarettes, e-cigarettes, and chewing tobacco. If you need help quitting, ask your doctor.  Try to lower your stress. If you need help doing this, ask your doctor.  If you are overweight, lose an amount of weight that is healthy for you. Ask your doctor about a safe weight loss goal. General instructions  Pay attention to any changes in  your symptoms.  Take over-the-counter and prescription medicines only as told by your doctor. Do not take aspirin, ibuprofen, or other NSAIDs unless your doctor says it is okay.  Wear loose clothes. Do not wear anything tight around your waist.  Raise (elevate) the head of your bed about 6 inches (15 cm).  Avoid bending over if this makes your symptoms worse.  Keep all follow-up visits as told by your doctor. This is important. Contact a doctor if:  You have new symptoms.  You lose weight and you do not know why.  You have trouble swallowing or it hurts to swallow.  You have wheezing or a cough that keeps happening.  Your symptoms do not get better with treatment.  You have a hoarse voice. Get help right away if:  You have pain in your arms, neck, jaw, teeth, or back.  You feel sweaty, dizzy, or light-headed.  You have chest pain or shortness of breath.  You throw up (vomit) and your throw-up looks like blood or coffee grounds.  You pass out (faint).  Your poop (stool) is bloody or black.  You cannot swallow, drink, or eat. Summary  If a person has gastroesophageal reflux disease (GERD), food and stomach acid move back up into the esophagus and cause symptoms or problems such as damage to the esophagus.  Treatment will depend on how bad your symptoms are.  Follow a diet as told by your doctor.  Take all medicines only as told by your doctor. This information is not intended to replace advice given to you by your health care provider. Make sure you discuss any questions you have with your health care provider. Document Released: 04/17/2008 Document Revised: 05/08/2018 Document Reviewed: 05/08/2018 Elsevier Patient Education  2020 [employer].

## 2019-07-12 ENCOUNTER — Other Ambulatory Visit: Payer: Self-pay | Admitting: Nurse Practitioner

## 2019-07-12 DIAGNOSIS — I10 Essential (primary) hypertension: Secondary | ICD-10-CM

## 2019-07-29 ENCOUNTER — Other Ambulatory Visit: Payer: Self-pay | Admitting: Nurse Practitioner

## 2019-07-29 DIAGNOSIS — F324 Major depressive disorder, single episode, in partial remission: Secondary | ICD-10-CM

## 2019-07-30 ENCOUNTER — Other Ambulatory Visit: Payer: Self-pay | Admitting: Nurse Practitioner

## 2019-07-30 DIAGNOSIS — G245 Blepharospasm: Secondary | ICD-10-CM

## 2019-07-30 NOTE — Telephone Encounter (Signed)
Last filled in Epic on 05/30/2019, rx request routed to [name], NP to review [place] Database and approve if necessary

## 2019-08-05 IMAGING — CT CT ANGIO HEAD
3 of 11 series · 17 of 47 positions shown · IV contrast (APPLIED)
Comparison: 05/08/2014 CT head. 06/04/2006 MRA head.

CLINICAL DATA: History of intracranial aneurysm post coiling for
follow-up. Headaches.

EXAM:
CT ANGIOGRAPHY HEAD
TECHNIQUE: Multidetector CT imaging of the head was performed using the
standard protocol during bolus administration of intravenous
contrast. Multiplanar CT image reconstructions and MIPs were
obtained to evaluate the vascular anatomy.
CONTRAST:  100mL [id number] IOPAMIDOL ([id number]-73W) INJECTION 76%

[Series 6: thin axial · axial · 0.45mm/px · z∈[-142,+13]mm · 11 of 187 slices shown]
[im 16/187  brain]
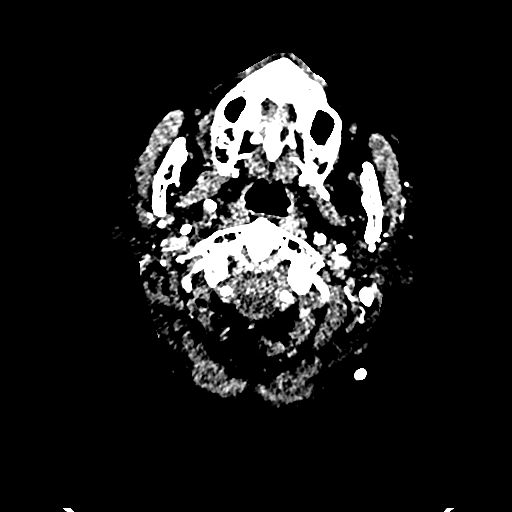
[im 32/187  bone]
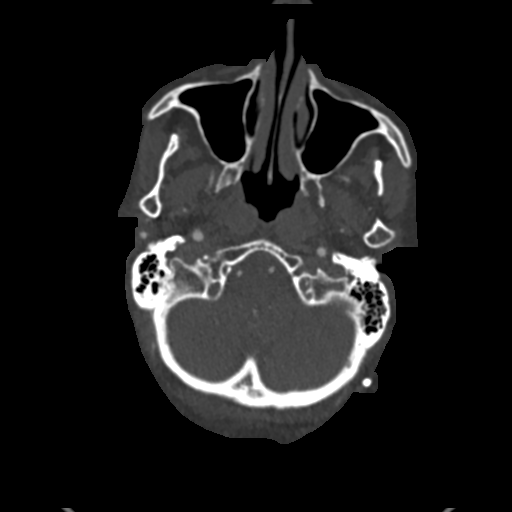
[im 47/187  brain]
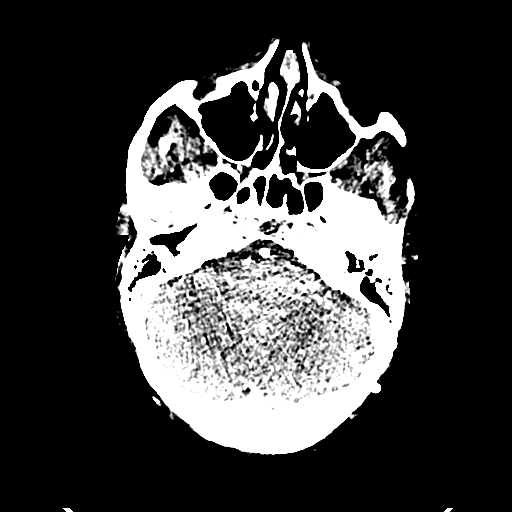
[im 63/187  bone]
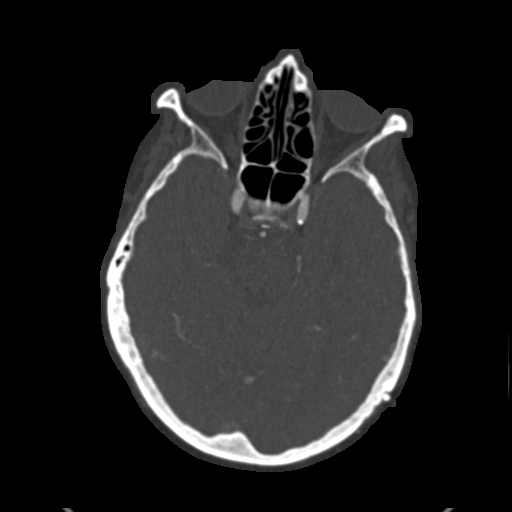
[im 78/187  brain]
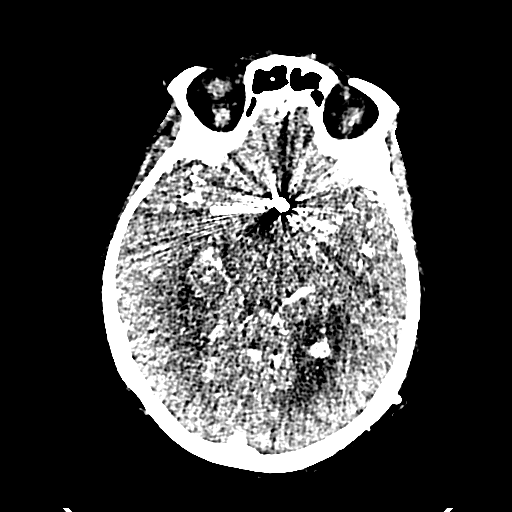
[im 94/187  bone]
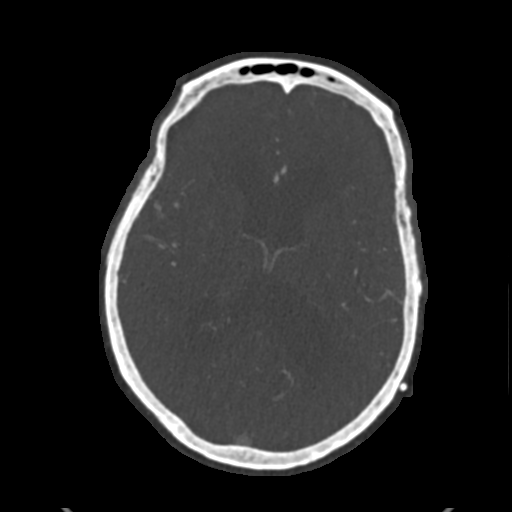
[im 109/187  brain]
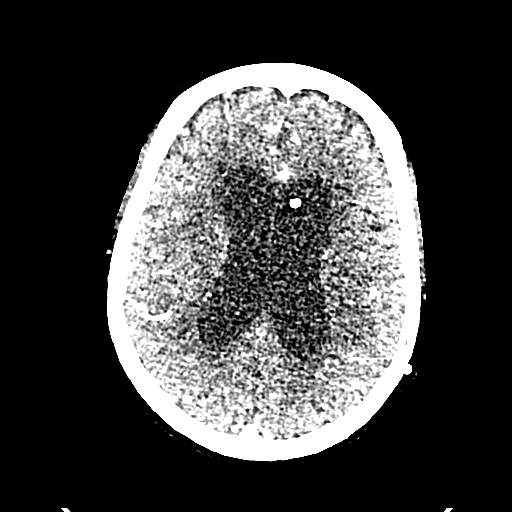
[im 125/187  bone]
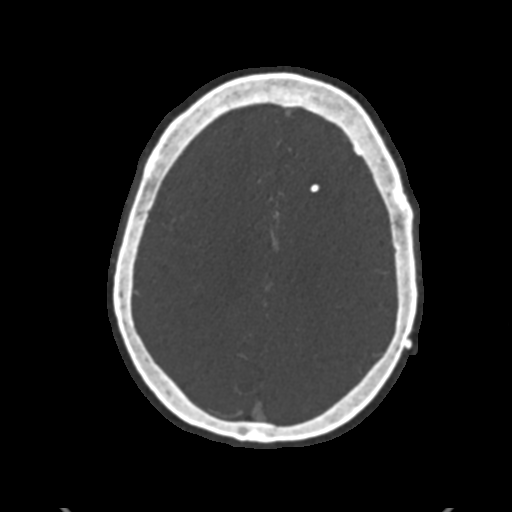
[im 140/187  brain]
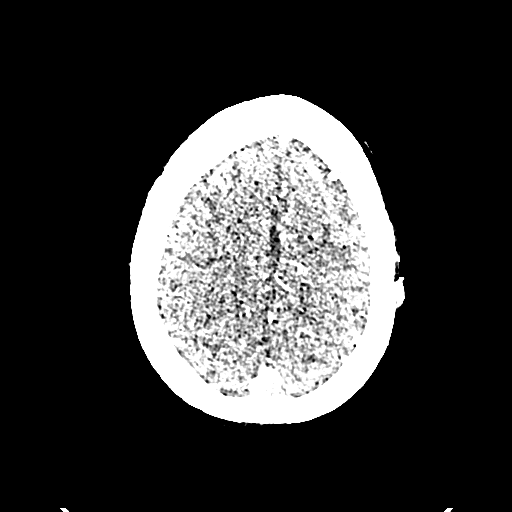
[im 156/187  bone]
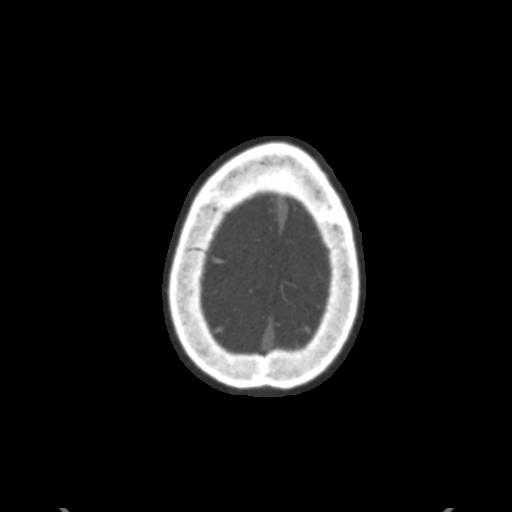
[im 171/187  brain]
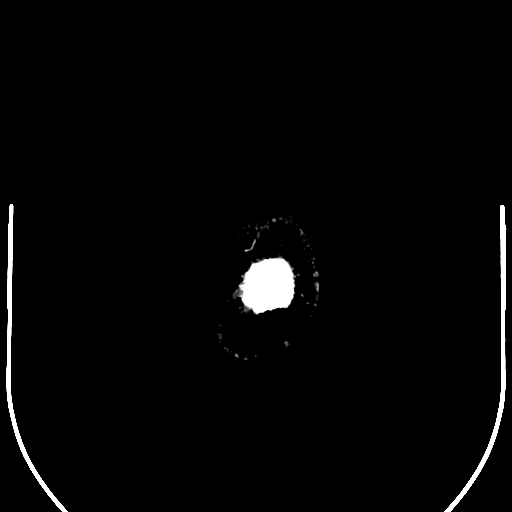

[Series 7: coronal thin · coronal · 0.36mm/px · 3 of 225 slices shown]
[im 65/225  brain]
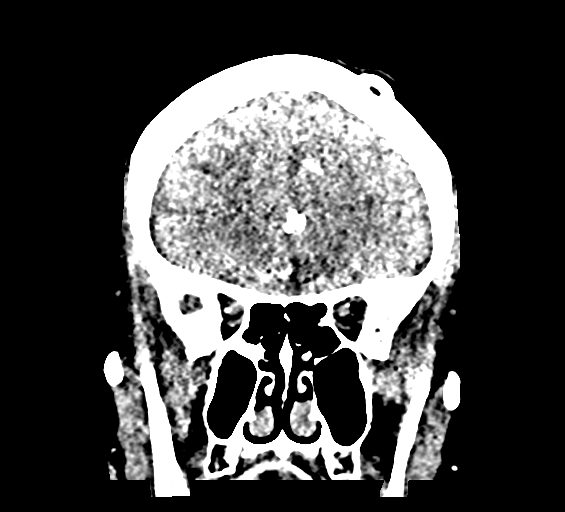
[im 97/225  brain]
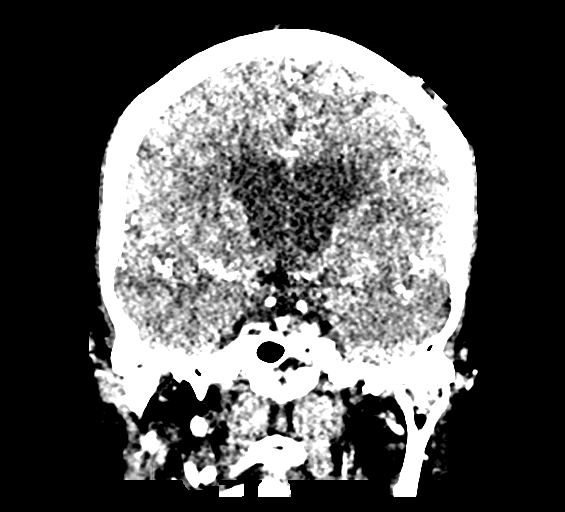
[im 129/225  brain]
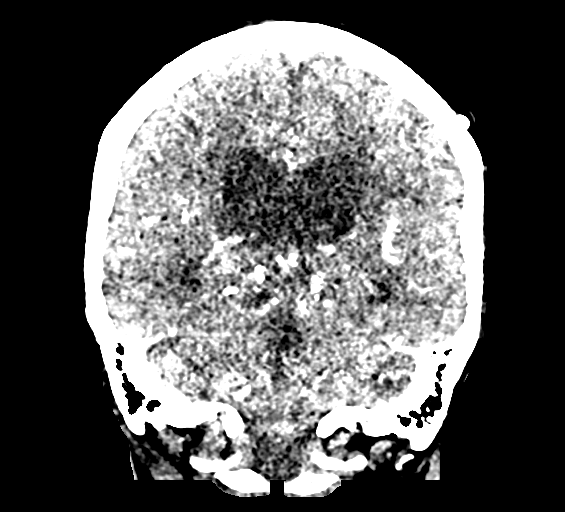

[Series 9: sagittal thin · sagittal · 0.39mm/px · 3 of 207 slices shown]
[im 42/207  brain]
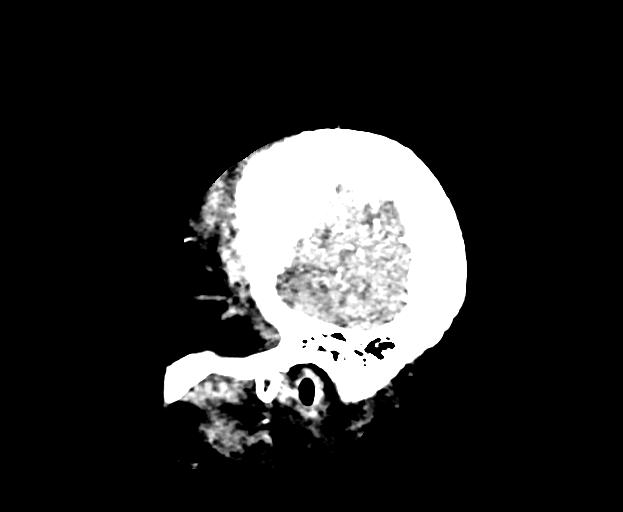
[im 83/207  brain]
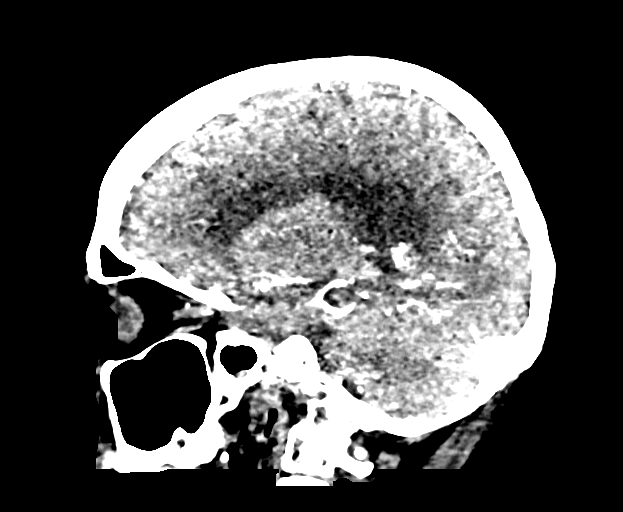
[im 124/207  brain]
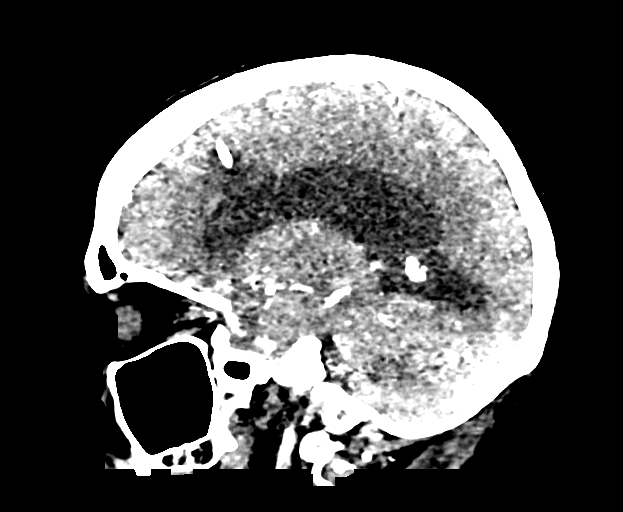

[17 of 47 positions shown; findings below may reference images not displayed]

FINDINGS: CT HEAD

Brain: Left frontal approach ventriculostomy catheter with tip in
the frontal horn of left lateral ventricle is stable in position.
Slight increase in size of lateral and third ventricles, frontal
horns measure 52 mm medial-lateral and third ventricle 11 mm,
previously 15 mm in 10 mm measured in a similar fashion. Stable
white matter hypodensities compatible with chronic microvascular
ischemic changes. Stable gliosis along the left frontal approach
catheter tract. Volume loss of the brain with sulcal prominence. No
hemorrhage, stroke, mass effect, or herniation identified.

Vascular: As below.

Skull: Chronic postsurgical changes related to left frontal burr
hole. No acute abnormality.

Sinuses: Imaged portions are clear.

Orbits: Bilateral intra-ocular lens replacement.

CTA HEAD

Anterior circulation: A-comm aneurysm coil, no residual/recurrent
aneurysm identified. No large vessel occlusion, additional aneurysm,
or significant stenosis is identified.

Posterior circulation: No significant stenosis, proximal occlusion,
aneurysm, or vascular malformation.

Venous sinuses: As permitted by contrast timing, patent.

Anatomic variants: Complete circle-of-Willis with bilateral fetal
PCA.

Delayed phase: No abnormal intracranial enhancement.
IMPRESSION: 1. Stable left frontal approach ventriculostomy catheter with tip in
the frontal horn of left lateral ventricle. Slight increase in size
of lateral and third ventricles, likely related to interval volume
loss of the brain.
2. Stable chronic microvascular ischemic changes.
3. A-comm aneurysm coil, no residual/recurrent aneurysm identified.
4. Patent anterior and posterior intracranial circulation. No
significant stenosis, proximal occlusion, new aneurysm, or vascular
malformation.

## 2019-09-04 ENCOUNTER — Ambulatory Visit (INDEPENDENT_AMBULATORY_CARE_PROVIDER_SITE_OTHER): Payer: Medicare Other | Admitting: *Deleted

## 2019-09-04 DIAGNOSIS — I495 Sick sinus syndrome: Secondary | ICD-10-CM | POA: Diagnosis not present

## 2019-09-04 DIAGNOSIS — R55 Syncope and collapse: Secondary | ICD-10-CM

## 2019-09-04 LAB — CUP PACEART REMOTE DEVICE CHECK
Date Time Interrogation Session: 20201022155451
Implantable Lead Implant Date: 20070830
Implantable Lead Implant Date: 20070830
Implantable Lead Location: 753859
Implantable Lead Location: 753860
Implantable Pulse Generator Implant Date: 20151113
Lead Channel Setting Pacing Amplitude: 1.5 V
Lead Channel Setting Pacing Amplitude: 3.6 V
Lead Channel Setting Pacing Pulse Width: 0.4 ms
Pulse Gen Model: 394931
Pulse Gen Serial Number: 68408118

## 2019-09-15 NOTE — Progress Notes (Signed)
Remote pacemaker transmission.   

## 2019-09-17 ENCOUNTER — Ambulatory Visit: Payer: Medicare Other | Admitting: Nurse Practitioner

## 2019-09-22 ENCOUNTER — Ambulatory Visit: Payer: Self-pay | Admitting: Nurse Practitioner

## 2019-09-24 ENCOUNTER — Ambulatory Visit (INDEPENDENT_AMBULATORY_CARE_PROVIDER_SITE_OTHER): Payer: Medicare Other | Admitting: Nurse Practitioner

## 2019-09-24 ENCOUNTER — Other Ambulatory Visit: Payer: Self-pay

## 2019-09-24 ENCOUNTER — Encounter: Payer: Self-pay | Admitting: Nurse Practitioner

## 2019-09-24 VITALS — BP 126/78 | HR 76 | Temp 97.5°F | Ht 69.0 in | Wt 189.0 lb

## 2019-09-24 DIAGNOSIS — G44329 Chronic post-traumatic headache, not intractable: Secondary | ICD-10-CM

## 2019-09-24 DIAGNOSIS — I609 Nontraumatic subarachnoid hemorrhage, unspecified: Secondary | ICD-10-CM

## 2019-09-24 DIAGNOSIS — F331 Major depressive disorder, recurrent, moderate: Secondary | ICD-10-CM | POA: Diagnosis not present

## 2019-09-24 DIAGNOSIS — R2681 Unsteadiness on feet: Secondary | ICD-10-CM

## 2019-09-24 DIAGNOSIS — E119 Type 2 diabetes mellitus without complications: Secondary | ICD-10-CM | POA: Diagnosis not present

## 2019-09-24 DIAGNOSIS — I10 Essential (primary) hypertension: Secondary | ICD-10-CM

## 2019-09-24 DIAGNOSIS — R296 Repeated falls: Secondary | ICD-10-CM

## 2019-09-24 NOTE — Progress Notes (Signed)
Careteam: Patient Care Team: [name], NP as PCP - General (Geriatric Medicine) Patsey Berthold, NP as Nurse Practitioner (Cardiology) Darleen Crocker, MD as Consulting Physician (Ophthalmology) Thompson Grayer, MD as Consulting Physician (Cardiology) Otelia Sergeant, OD as Referring Physician (Ophthalmology)  Advanced Directive information Does Patient Have a Medical Advance Directive?: No, Would patient like information on creating a medical advance directive?: Yes (MAU/Ambulatory/Procedural Areas - Information given)  Allergies  Allergen Reactions  . Oxaprozin Rash    Chief Complaint  Patient presents with  . Follow-up    Blood pressure follow-up. Scored 15 on PHQ9. Here with son   . Quality Metric Gaps    Flu updated through pharmacy. Discuss TD/TDaP recommendation      HPI: Patient is a 78 y.o. female seen in the office today for blood pressure check   Has had 2 falls recently. Reports she is sleeping more, not exercising. Increase in weakness. Son is very concerned due to this. He lives with her. Hit head on last fall and then had headache afterwards. Last fall was 1 week ago.  Unsure why she is falling. She states she is not sure if she has blacked up or if it is due to being unsteady.  Son see her all the time almost fall due to lack of muscle and has never seen her black out.  No chest pains or palpitations. Reports blurred vision but this has been ongoing and has been progressive. Has had multiple procedures for her eyes at [facility] due to blepharospasm. Son reports they are planning to call [facility] and see the ophthalmologist specialist again.   Reports ongoing headaches- has tired to reach out to neurosurgery office but unable to get a return call. Neurosurgery attempted to reach out and voicemail stated they were not accepting calls per office.  Plan was to do MRI but unable to do so CT scan was done.   Review of Systems:  Review of Systems  Constitutional:  Positive for malaise/fatigue. Negative for chills, fever and weight loss.  HENT: Negative for tinnitus.   Respiratory: Negative for cough and shortness of breath.   Cardiovascular: Negative for chest pain and leg swelling.  Musculoskeletal: Positive for falls and myalgias. Negative for joint pain.  Neurological: Positive for weakness and headaches. Negative for dizziness, sensory change, speech change, focal weakness and seizures.  Psychiatric/Behavioral: Positive for depression and memory loss. The patient is nervous/anxious.     Past Medical History:  Diagnosis Date  . Anxiety and depression    Hx of  . Blepharospasm    . Breast cancer ([place])    s/p lumpectomy  . Carotid bruit    Bilateral. Mild to moderate disease on LEFT and mild on the RIGHT in 2012  . COPD (chronic obstructive pulmonary disease) ([place])   . Diabetes mellitus without complication ([place])    AODM  . Fuchs' corneal dystrophy   . GERD (gastroesophageal reflux disease)    . Hyperlipidemia   . Hypertension    . Insomnia    . Intracerebral bleed ([place]) 2003   Remote intracerebral blled and coiling by Dr. Estanislado Pandy for cerebral aneurysm ans a shunt placed by Dr. Vertell Limber remotely. No seizure disorder and this happened in 2003 with an anterior communicating aneurysm.  Marland Kitchen Neurocardiogenic syncope    a. s/p Biotronik dual chamber pacemaker  . Personal history of radiation therapy   . Sinus node dysfunction (HCC)    . Tobacco abuse    . Type II  or unspecified type diabetes mellitus without mention of complication, not stated as uncontrolled     Past Surgical History:  Procedure Laterality Date  . ANEURYSM COILING  2003   Cerebral aneurysm ;Dr. Luanne Bras  . BREAST LUMPECTOMY Right    For cancer, no recurrence  . CHOLECYSTECTOMY    . PACEMAKER GENERATOR CHANGE N/A 09/25/2014   BTK dual chamber pacemkaer implanted by Dr Rayann Heman  . PACEMAKER INSERTION  07/12/2006   [employer] Jude Victory XL DR  -  Dr. Tami Ribas  . PARTIAL  HYSTERECTOMY    . VENTRICULOPERITONEAL SHUNT  2003   Dr. Erline Levine   Social History:   reports that she has quit smoking. Her smoking use included cigarettes. She started smoking about 8 months ago. She has a 7.50 pack-year smoking history. She has never used smokeless tobacco. She reports that she does not drink alcohol or use drugs.  Family History  Problem Relation Age of Onset  . Cancer Mother   . Heart attack Father   . Heart attack Son   . Hypertension Other     Medications: Patient's Medications  New Prescriptions   No medications on file  Previous Medications   ASPIRIN EC 325 MG TABLET    Take 325 mg by mouth daily.   ATORVASTATIN (LIPITOR) 40 MG TABLET    Take 1 tablet (40 mg total) by mouth daily. For Cholesterol   BUPROPION (WELLBUTRIN SR) 150 MG 12 HR TABLET    TAKE 1 TABLET BY MOUTH TWICE DAILY   CARBOXYMETHYLCELLULOSE (REFRESH PLUS) 0.5 % SOLN    Place 1 drop into both eyes as needed.   CLONAZEPAM (KLONOPIN) 1 MG TABLET    TAKE 1 TABLET BY MOUTH THREE TIMES DAILY AS NEEDED   LOSARTAN (COZAAR) 100 MG TABLET    TAKE 1 TABLET BY MOUTH DAILY   METFORMIN (GLUCOPHAGE) 500 MG TABLET    TAKE 1 TABLET BY MOUTH WITH BREAKFAST AND WITH SUPPER FOR DIABETES   METOPROLOL TARTRATE (LOPRESSOR) 50 MG TABLET    TAKE 1 TABLET BY MOUTH TWICE DAILY   PANTOPRAZOLE (PROTONIX) 40 MG TABLET    TAKE 1 TABLET BY MOUTH TWICE DAILY   POLYETHYL GLYCOL-PROPYL GLYCOL (SYSTANE) 0.4-0.3 % SOLN    Apply to eye as needed.   SERTRALINE (ZOLOFT) 50 MG TABLET    Take 1 tablet (50 mg total) by mouth daily.   TETRAHYDROZOLINE (VISINE) 0.05 % OPHTHALMIC SOLUTION    as needed.  Modified Medications   No medications on file  Discontinued Medications   No medications on file    Physical Exam:  Vitals:   09/24/19 0924  BP: 126/78  Pulse: 76  Temp: (!) 97.5 F (36.4 C)  TempSrc: Temporal  SpO2: 94%  Weight: 189 lb (85.7 kg)  Height: 5\' 9"  (1.753 m)   Body mass index is 27.91 kg/m. Wt Readings  from Last 3 Encounters:  09/24/19 189 lb (85.7 kg)  10/01/18 189 lb 6.4 oz (85.9 kg)  06/01/18 183 lb (83 kg)    Physical Exam Constitutional:      General: She is not in acute distress.    Appearance: She is well-developed. She is not diaphoretic.  HENT:     Head: Normocephalic and atraumatic.     Mouth/Throat:     Pharynx: No oropharyngeal exudate.  Eyes:     Conjunctiva/sclera: Conjunctivae normal.     Pupils: Pupils are equal, round, and reactive to light.  Neck:     Musculoskeletal: Normal range  of motion and neck supple.  Cardiovascular:     Rate and Rhythm: Normal rate and regular rhythm.     Heart sounds: Normal heart sounds.  Pulmonary:     Effort: Pulmonary effort is normal.     Breath sounds: Normal breath sounds.  Abdominal:     General: Bowel sounds are normal.     Palpations: Abdomen is soft.  Musculoskeletal:        General: No tenderness.  Skin:    General: Skin is warm and dry.  Neurological:     Mental Status: She is alert and oriented to person, place, and time.     Cranial Nerves: Cranial nerves are intact.     Sensory: Sensation is intact.     Motor: Weakness present.     Gait: Gait is intact.  Psychiatric:        Mood and Affect: Affect is blunt.     Labs reviewed: Basic Metabolic Panel: Recent Labs    10/01/18 1520 12/19/18 1516 03/06/19 0838  NA 139  --  138  K 4.8  --  4.7  CL 102  --  105  CO2 28  --  25  GLUCOSE 103  --  119*  BUN 18  --  17  CREATININE 0.84 0.80 0.97*  CALCIUM 9.8  --  9.2  TSH 1.74  --   --    Liver Function Tests: Recent Labs    03/06/19 0838  AST 10  ALT 8  BILITOT 0.6  PROT 5.9*   No results for input(s): LIPASE, AMYLASE in the last 8760 hours. No results for input(s): AMMONIA in the last 8760 hours. CBC: Recent Labs    10/01/18 1520  WBC 7.0  NEUTROABS 4,634  HGB 12.3  HCT 37.0  MCV 84.9  PLT 305   Lipid Panel: Recent Labs    03/06/19 0838  CHOL 104  HDL 45*  LDLCALC 43  TRIG 75   CHOLHDL 2.3   TSH: Recent Labs    10/01/18 1520  TSH 1.74   A1C: Lab Results  Component Value Date   HGBA1C 6.6 (H) 03/06/2019     Assessment/Plan 1. Moderate episode of recurrent major depressive disorder ([place]) -she has not reached out to therapist or psychiatrist yet but plans to do so. Information given again today  2. Essential hypertension -at goal on current regimen. Continue medication with dietary modifications - COMPLETE METABOLIC PANEL WITH GFR - CBC with Differential/Platelet  3. Type 2 diabetes mellitus without complication, without long-term current use of insulin (HCC) Does not check blood sugars at home. Does not feel like she has had any hypoglycemia or low blood sugars. Continues on metformin 500 mg by mouth twice daily  - Hemoglobin A1c  4. Unsteady gait -worsening weakness with falls. Son suspects this is due to deconditioning however with hx of subarachnoid hemorrhage and  - Ambulatory referral to Home Health  5. Multiple falls - Ambulatory referral to [place]  6. Subarachnoid hemorrhage (HCC) -now with worsening headaches, no warning signs noted today.  did CT of head (unable to do MRI due to pacemaker) but never made appt with neurosurgery to review, personally spoke with Dr Kathyrn Sheriff office and due to falls and headaches recommending to repeat CT with and without contrast prior to appt. When speaking to the office they stated it is the pts responsibility to call and make appt after scans are done. Relayed this information to pt and son and they understand and  plan to get CT and will make appt with neurosurgery once complete.  - Ambulatory referral to [place]; Future - Ambulatory referral to Neurosurgery  7. Chronic post-traumatic headache, not intractable -hx of headaches, now they are worsening and with multiple falls with get CT head. Plans to follow up with neurosurgery once complete.  - Ambulatory referral to  [facility]; Future - Ambulatory referral to Neurosurgery  Next appt: 4 month follow up,  Marthe Dant K. Placedo, [facility] [phone]

## 2019-09-24 NOTE — Patient Instructions (Signed)
CT of the head has been ordered AFTER you get this CT done you need to call and make appt with neurosurgery for visit and results It is your responsibility to call their office and make appt NEUROSURGERY OFFICE Address: [address], [place], [place] [address] Phone: [phone]

## 2019-09-25 LAB — CBC WITH DIFFERENTIAL/PLATELET
Absolute Monocytes: 350 cells/uL (ref 200–950)
Basophils Absolute: 63 cells/uL (ref 0–200)
Basophils Relative: 0.9 %
Eosinophils Absolute: 182 cells/uL (ref 15–500)
Eosinophils Relative: 2.6 %
HCT: 35.4 % (ref 35.0–45.0)
Hemoglobin: 11.6 g/dL — ABNORMAL LOW (ref 11.7–15.5)
Lymphs Abs: 553 cells/uL — ABNORMAL LOW (ref 850–3900)
MCH: 28.2 pg (ref 27.0–33.0)
MCHC: 32.8 g/dL (ref 32.0–36.0)
MCV: 86.1 fL (ref 80.0–100.0)
MPV: 10.6 fL (ref 7.5–12.5)
Monocytes Relative: 5 %
Neutro Abs: 5852 cells/uL (ref 1500–7800)
Neutrophils Relative %: 83.6 %
Platelets: 279 10*3/uL (ref 140–400)
RBC: 4.11 10*6/uL (ref 3.80–5.10)
RDW: 13.1 % (ref 11.0–15.0)
Total Lymphocyte: 7.9 %
WBC: 7 10*3/uL (ref 3.8–10.8)

## 2019-09-25 LAB — COMPLETE METABOLIC PANEL WITH GFR
AG Ratio: 2 (calc) (ref 1.0–2.5)
ALT: 9 U/L (ref 6–29)
AST: 11 U/L (ref 10–35)
Albumin: 4.1 g/dL (ref 3.6–5.1)
Alkaline phosphatase (APISO): 81 U/L (ref 37–153)
BUN: 12 mg/dL (ref 7–25)
CO2: 28 mmol/L (ref 20–32)
Calcium: 8.9 mg/dL (ref 8.6–10.4)
Chloride: 105 mmol/L (ref 98–110)
Creat: 0.74 mg/dL (ref 0.60–0.93)
GFR, Est African American: 90 mL/min/{1.73_m2} (ref >=60)
GFR, Est Non African American: 78 mL/min/{1.73_m2} (ref >=60)
Globulin: 2.1 g/dL (calc) (ref 1.9–3.7)
Glucose, Bld: 113 mg/dL — ABNORMAL HIGH (ref 65–99)
Potassium: 4 mmol/L (ref 3.5–5.3)
Sodium: 140 mmol/L (ref 135–146)
Total Bilirubin: 0.8 mg/dL (ref 0.2–1.2)
Total Protein: 6.2 g/dL (ref 6.1–8.1)

## 2019-09-25 LAB — HEMOGLOBIN A1C
Hgb A1c MFr Bld: 6.3 % of total Hgb — ABNORMAL HIGH (ref <5.7)
Mean Plasma Glucose: 134 (calc)
eAG (mmol/L): 7.4 (calc)

## 2019-09-26 ENCOUNTER — Other Ambulatory Visit: Payer: Self-pay | Admitting: Nurse Practitioner

## 2019-09-26 DIAGNOSIS — G245 Blepharospasm: Secondary | ICD-10-CM

## 2019-09-29 ENCOUNTER — Other Ambulatory Visit: Payer: Self-pay | Admitting: Nurse Practitioner

## 2019-09-29 NOTE — Telephone Encounter (Signed)
Appointment has been made for Controlled Substance FORM  Appointment date 10/13/19 at 3:15 pm

## 2019-10-02 ENCOUNTER — Telehealth: Payer: Self-pay

## 2019-10-02 NOTE — Telephone Encounter (Signed)
Yes a CT of the head was ordered, I am sending this to Lattie Haw to see if she can check on this. She is go to her neurologist AFTER the CT of head is complete.

## 2019-10-02 NOTE — Telephone Encounter (Signed)
Patient stated someone was supposed to set up an appointment with Neurology for some type of head imaging secondary to her subarachnoid hemorrhage and she has had a few falls.  I see an order placed 09/24/19.  Will imaging call her, we need to set it up, or ??? She has an appointment on 11/30.   She did seem to have some confusion, but said she hadn't heard anything about a scan.

## 2019-10-06 DIAGNOSIS — L570 Actinic keratosis: Secondary | ICD-10-CM | POA: Diagnosis not present

## 2019-10-06 DIAGNOSIS — G245 Blepharospasm: Secondary | ICD-10-CM | POA: Diagnosis not present

## 2019-10-06 DIAGNOSIS — E119 Type 2 diabetes mellitus without complications: Secondary | ICD-10-CM | POA: Diagnosis not present

## 2019-10-06 DIAGNOSIS — S066X0D Traumatic subarachnoid hemorrhage without loss of consciousness, subsequent encounter: Secondary | ICD-10-CM | POA: Diagnosis not present

## 2019-10-06 DIAGNOSIS — M545 Low back pain: Secondary | ICD-10-CM | POA: Diagnosis not present

## 2019-10-06 DIAGNOSIS — F419 Anxiety disorder, unspecified: Secondary | ICD-10-CM | POA: Diagnosis not present

## 2019-10-06 DIAGNOSIS — B351 Tinea unguium: Secondary | ICD-10-CM | POA: Diagnosis not present

## 2019-10-06 DIAGNOSIS — K219 Gastro-esophageal reflux disease without esophagitis: Secondary | ICD-10-CM | POA: Diagnosis not present

## 2019-10-06 DIAGNOSIS — G44329 Chronic post-traumatic headache, not intractable: Secondary | ICD-10-CM | POA: Diagnosis not present

## 2019-10-06 DIAGNOSIS — G8929 Other chronic pain: Secondary | ICD-10-CM | POA: Diagnosis not present

## 2019-10-06 DIAGNOSIS — J449 Chronic obstructive pulmonary disease, unspecified: Secondary | ICD-10-CM | POA: Diagnosis not present

## 2019-10-06 DIAGNOSIS — E785 Hyperlipidemia, unspecified: Secondary | ICD-10-CM | POA: Diagnosis not present

## 2019-10-06 DIAGNOSIS — G4700 Insomnia, unspecified: Secondary | ICD-10-CM | POA: Diagnosis not present

## 2019-10-06 DIAGNOSIS — I10 Essential (primary) hypertension: Secondary | ICD-10-CM | POA: Diagnosis not present

## 2019-10-06 DIAGNOSIS — M542 Cervicalgia: Secondary | ICD-10-CM | POA: Diagnosis not present

## 2019-10-06 DIAGNOSIS — F331 Major depressive disorder, recurrent, moderate: Secondary | ICD-10-CM | POA: Diagnosis not present

## 2019-10-07 ENCOUNTER — Telehealth: Payer: Self-pay

## 2019-10-07 NOTE — Telephone Encounter (Signed)
Sree from [facility] called to state patient was recently seen yesterday for the first time 10/06/19 and needs date to be changed to yesterday's date for status care

## 2019-10-07 NOTE — Telephone Encounter (Signed)
I need clarification on this.

## 2019-10-07 NOTE — Telephone Encounter (Signed)
This is just fyi no action required.

## 2019-10-13 ENCOUNTER — Other Ambulatory Visit: Payer: Self-pay

## 2019-10-13 ENCOUNTER — Ambulatory Visit (INDEPENDENT_AMBULATORY_CARE_PROVIDER_SITE_OTHER): Payer: Medicare Other | Admitting: Nurse Practitioner

## 2019-10-13 VITALS — BP 128/80 | HR 92 | Temp 97.1°F | Ht 69.0 in | Wt 189.0 lb

## 2019-10-13 DIAGNOSIS — I872 Venous insufficiency (chronic) (peripheral): Secondary | ICD-10-CM

## 2019-10-13 DIAGNOSIS — G245 Blepharospasm: Secondary | ICD-10-CM | POA: Diagnosis not present

## 2019-10-13 MED ORDER — CLONAZEPAM 1 MG PO TABS: 1.0000 mg | ORAL_TABLET | Freq: Three times a day (TID) | ORAL | 0 refills | Status: DC | PRN

## 2019-10-13 NOTE — Progress Notes (Signed)
Careteam: Patient Care Team: [name], NP as PCP - General (Geriatric Medicine) Patsey Berthold, NP as Nurse Practitioner (Cardiology) Darleen Crocker, MD as Consulting Physician (Ophthalmology) Thompson Grayer, MD as Consulting Physician (Cardiology) Otelia Sergeant, OD as Referring Physician (Ophthalmology)  Advanced Directive information    Allergies  Allergen Reactions  . Oxaprozin Rash    Chief Complaint  Patient presents with  . Medication Management    Review medications and update contract   . Medication Refill    Renew Clonazepam, filled for 30 on 09/29/2019. RX was suppose to be for 90   . Leg Swelling    Patient with bilateral leg swelling and pain off/on     HPI: Patient is a 77 y.o. female seen in the office today to update controlled substance contract. She is currently taking clonazepam TID for blepharospasm. She maxed out on botox and it was not providing benefit. Without clonazepam blepharospasm would not be tolerable. Had followed with ophthalmologist and they had no other options.   Leg swelling-better in morning, worse by bedtime no added salt. Both legs. Sits with legs down most the day. No pain or redness noted.   Spoke with a therapist due to anxiety/depression, has another upcoming appt.   Review of Systems:  Review of Systems  Constitutional: Negative for chills, fever and weight loss.  Cardiovascular: Positive for leg swelling. Negative for chest pain and palpitations.  Psychiatric/Behavioral: Positive for depression. The patient is nervous/anxious. The patient does not have insomnia.     Past Medical History:  Diagnosis Date  . Anxiety and depression    Hx of  . Blepharospasm    . Breast cancer ([place])    s/p lumpectomy  . Carotid bruit    Bilateral. Mild to moderate disease on LEFT and mild on the RIGHT in 2012  . COPD (chronic obstructive pulmonary disease) ([place])   . Diabetes mellitus without complication ([place])    AODM  .  Fuchs' corneal dystrophy   . GERD (gastroesophageal reflux disease)    . Hyperlipidemia   . Hypertension    . Insomnia    . Intracerebral bleed ([place]) 2003   Remote intracerebral blled and coiling by Dr. Estanislado Pandy for cerebral aneurysm ans a shunt placed by Dr. Vertell Limber remotely. No seizure disorder and this happened in 2003 with an anterior communicating aneurysm.  Marland Kitchen Neurocardiogenic syncope    a. s/p Biotronik dual chamber pacemaker  . Personal history of radiation therapy   . Sinus node dysfunction (HCC)    . Tobacco abuse    . Type II or unspecified type diabetes mellitus without mention of complication, not stated as uncontrolled     Past Surgical History:  Procedure Laterality Date  . ANEURYSM COILING  2003   Cerebral aneurysm ;Dr. Luanne Bras  . BREAST LUMPECTOMY Right    For cancer, no recurrence  . CHOLECYSTECTOMY    . PACEMAKER GENERATOR CHANGE N/A 09/25/2014   BTK dual chamber pacemkaer implanted by Dr Rayann Heman  . PACEMAKER INSERTION  07/12/2006   [employer] Jude Victory XL DR  -  Dr. Tami Ribas  . PARTIAL HYSTERECTOMY    . VENTRICULOPERITONEAL SHUNT  2003   Dr. Erline Levine   Social History:   reports that she has quit smoking. Her smoking use included cigarettes. She started smoking about 9 months ago. She has a 7.50 pack-year smoking history. She has never used smokeless tobacco. She reports that she does not drink alcohol or use drugs.  Family History  Problem Relation Age of Onset  . Cancer Mother   . Heart attack Father   . Heart attack Son   . Hypertension Other     Medications: Patient's Medications  New Prescriptions   No medications on file  Previous Medications   ASPIRIN EC 325 MG TABLET    Take 325 mg by mouth daily.   ATORVASTATIN (LIPITOR) 40 MG TABLET    TAKE 1 TABLET BY MOUTH EVERY DAY FOR CHOLESTEROL   BUPROPION (WELLBUTRIN SR) 150 MG 12 HR TABLET    TAKE 1 TABLET BY MOUTH TWICE DAILY   CARBOXYMETHYLCELLULOSE (REFRESH PLUS) 0.5 % SOLN    Place 1 drop  into both eyes as needed.   CLONAZEPAM (KLONOPIN) 1 MG TABLET    TAKE 1 TABLET BY MOUTH THREE TIMES DAILY AS NEEDED   LOSARTAN (COZAAR) 100 MG TABLET    TAKE 1 TABLET BY MOUTH DAILY   METFORMIN (GLUCOPHAGE) 500 MG TABLET    TAKE 1 TABLET BY MOUTH WITH BREAKFAST AND WITH SUPPER FOR DIABETES   METOPROLOL TARTRATE (LOPRESSOR) 50 MG TABLET    TAKE 1 TABLET BY MOUTH TWICE DAILY   PANTOPRAZOLE (PROTONIX) 40 MG TABLET    TAKE 1 TABLET BY MOUTH TWICE DAILY   POLYETHYL GLYCOL-PROPYL GLYCOL (SYSTANE) 0.4-0.3 % SOLN    Apply to eye as needed.   SERTRALINE (ZOLOFT) 50 MG TABLET    Take 1 tablet (50 mg total) by mouth daily.   TETRAHYDROZOLINE (VISINE) 0.05 % OPHTHALMIC SOLUTION    as needed.  Modified Medications   No medications on file  Discontinued Medications   No medications on file    Physical Exam:  Vitals:   10/13/19 1529  BP: 128/80  Pulse: 92  Temp: (!) 97.1 F (36.2 C)  TempSrc: Temporal  SpO2: 95%  Weight: 189 lb (85.7 kg)  Height: 5\' 9"  (1.753 m)   Body mass index is 27.91 kg/m. Wt Readings from Last 3 Encounters:  10/13/19 189 lb (85.7 kg)  09/24/19 189 lb (85.7 kg)  10/01/18 189 lb 6.4 oz (85.9 kg)    Physical Exam Constitutional:      Appearance: Normal appearance.  HENT:     Head: Normocephalic and atraumatic.  Cardiovascular:     Rate and Rhythm: Normal rate and regular rhythm.  Pulmonary:     Effort: Pulmonary effort is normal.     Breath sounds: Normal breath sounds.  Musculoskeletal:     Right lower leg: Edema (trace) present.     Left lower leg: Edema (trace) present.  Neurological:     Mental Status: She is alert and oriented to person, place, and time.     Labs reviewed: Basic Metabolic Panel: Recent Labs    12/19/18 1516 03/06/19 0838 09/24/19 1027  NA  --  138 140  K  --  4.7 4.0  CL  --  105 105  CO2  --  25 28  GLUCOSE  --  119* 113*  BUN  --  17 12  CREATININE 0.80 0.97* 0.74  CALCIUM  --  9.2 8.9   Liver Function Tests: Recent  Labs    03/06/19 0838 09/24/19 1027  AST 10 11  ALT 8 9  BILITOT 0.6 0.8  PROT 5.9* 6.2   No results for input(s): LIPASE, AMYLASE in the last 8760 hours. No results for input(s): AMMONIA in the last 8760 hours. CBC: Recent Labs    09/24/19 1027  WBC 7.0  NEUTROABS 5,852  HGB 11.6*  HCT 35.4  MCV 86.1  PLT 279   Lipid Panel: Recent Labs    03/06/19 0838  CHOL 104  HDL 45*  LDLCALC 43  TRIG 75  CHOLHDL 2.3   TSH: No results for input(s): TSH in the last 8760 hours. A1C: Lab Results  Component Value Date   HGBA1C 6.3 (H) 09/24/2019     Assessment/Plan 1. Blepharospasm -controlled substance contract signed.  - clonazePAM (KLONOPIN) 1 MG tablet; Take 1 tablet (1 mg total) by mouth 3 (three) times daily as needed.  Dispense: 90 tablet; Refill: 0 - urine drug screen obtained   2. Edema of both lower extremities due to peripheral venous insufficiency -educated provided.  -encouraged to limit sodium intake, no added salt -To elevate legs above the level of the heart as she can and  do this in the afternoon for an hour if able. -compression socks/hose- place on in the morning and remove before bed -ensure proper protein in diet.   Next appt: 12/24/2019 as scheduled. Carlos American. [place], [facility] [phone]

## 2019-10-13 NOTE — Patient Instructions (Signed)
   To elevate legs above the level of the heart as she can  At least do this in the afternoon for an hour  Compression socks- place on in the morning and remove before bed  Low sodium diet  Make sure to be eating enough protein in diet   Edema  Edema is an abnormal buildup of fluids in the body tissues and under the skin. Swelling of the legs, feet, and ankles is a common symptom that becomes more likely as you get older. Swelling is also common in looser tissues, like around the eyes. When the affected area is squeezed, the fluid may move out of that spot and leave a dent for a few moments. This dent is called pitting edema. There are many possible causes of edema. Eating too much salt (sodium) and being on your feet or sitting for a long time can cause edema in your legs, feet, and ankles. Hot weather may make edema worse. Common causes of edema include:  Heart failure.  Liver or kidney disease.  Weak leg blood vessels.  Cancer.  An injury.  Pregnancy.  Medicines.  Being obese.  Low protein levels in the blood. Edema is usually painless. Your skin may look swollen or shiny. Follow these instructions at home:  Keep the affected body part raised (elevated) above the level of your heart when you are sitting or lying down.  Do not sit still or stand for long periods of time.  Do not wear tight clothing. Do not wear garters on your upper legs.  Exercise your legs to get your circulation going. This helps to move the fluid back into your blood vessels, and it may help the swelling go down.  Wear elastic bandages or support stockings to reduce swelling as told by your health care provider.  Eat a low-salt (low-sodium) diet to reduce fluid as told by your health care provider.  Depending on the cause of your swelling, you may need to limit how much fluid you drink (fluid restriction).  Take over-the-counter and prescription medicines only as told by your health care  provider. Contact a health care provider if:  Your edema does not get better with treatment.  You have heart, liver, or kidney disease and have symptoms of edema.  You have sudden and unexplained weight gain. Get help right away if:  You develop shortness of breath or chest pain.  You cannot breathe when you lie down.  You develop pain, redness, or warmth in the swollen areas.  You have heart, liver, or kidney disease and suddenly get edema.  You have a fever and your symptoms suddenly get worse. Summary  Edema is an abnormal buildup of fluids in the body tissues and under the skin.  Eating too much salt (sodium) and being on your feet or sitting for a long time can cause edema in your legs, feet, and ankles.  Keep the affected body part raised (elevated) above the level of your heart when you are sitting or lying down. This information is not intended to replace advice given to you by your health care provider. Make sure you discuss any questions you have with your health care provider. Document Released: 10/30/2005 Document Revised: 11/02/2017 Document Reviewed: 12/02/2016 Elsevier Patient Education  2020 [employer].

## 2019-10-15 ENCOUNTER — Inpatient Hospital Stay: Admission: RE | Admit: 2019-10-15 | Payer: Medicare Other | Source: Ambulatory Visit

## 2019-10-20 ENCOUNTER — Telehealth: Payer: Self-pay

## 2019-10-20 NOTE — Telephone Encounter (Signed)
-----   Message from [name], NP sent at 10/20/2019  9:46 AM EST ----- Did pt leave without getting her drug screen done? ----- Message ----- From: SYSTEM Sent: 10/18/2019  12:04 AM EST To: [name], NP

## 2019-10-20 NOTE — Telephone Encounter (Signed)
Called patient x 2, number was invalid. I verified I was dialing the correct number.   I called patients son and left message requesting that he contact patient and her her call us.

## 2019-10-21 ENCOUNTER — Inpatient Hospital Stay: Admission: RE | Admit: 2019-10-21 | Payer: Medicare Other | Source: Ambulatory Visit

## 2019-10-22 NOTE — Telephone Encounter (Signed)
Mary Higgins called and stated her son is in the hospital and she has no transportation to come in for a urine specimen This encounter was created in error - please disregard.

## 2019-10-22 NOTE — Telephone Encounter (Signed)
Called patients main number and received same recording, number is invalid.   I called patients son [name] and left another message requesting that he have patient return call and update her contact information.

## 2019-10-27 NOTE — Telephone Encounter (Signed)
Patient nor son has returned call. Message forwarded to [name], NP to advise on how to proceeded with concerns of patient having a UDS to be in compliance with controlled substance contract

## 2019-10-27 NOTE — Telephone Encounter (Signed)
Letter mailed to patient.

## 2019-10-27 NOTE — Telephone Encounter (Signed)
Lets send a letter in the mail for her to come to office and have UDS to be done.

## 2019-11-04 ENCOUNTER — Telehealth: Payer: Self-pay | Admitting: *Deleted

## 2019-11-04 NOTE — Telephone Encounter (Signed)
[facility] nurse called and stated that they have tried several time calling and showing up at the patient's home with no success. No Answer and no callback from the patient.  They are going to discharge patient from [facility] due to no return call and not at home when they go.

## 2019-11-04 NOTE — Telephone Encounter (Signed)
Noted, this is an ongoing issues and in the past we have also had trouble contacting her

## 2019-11-10 ENCOUNTER — Ambulatory Visit: Payer: Medicare Other | Admitting: Nurse Practitioner

## 2019-11-12 ENCOUNTER — Ambulatory Visit: Payer: Self-pay | Admitting: Nurse Practitioner

## 2019-11-15 ENCOUNTER — Other Ambulatory Visit: Payer: Self-pay | Admitting: Nurse Practitioner

## 2019-11-15 DIAGNOSIS — E119 Type 2 diabetes mellitus without complications: Secondary | ICD-10-CM

## 2019-11-18 ENCOUNTER — Telehealth: Payer: Medicare Other | Admitting: Nurse Practitioner

## 2019-11-18 ENCOUNTER — Other Ambulatory Visit: Payer: Self-pay

## 2019-11-18 ENCOUNTER — Telehealth: Payer: Self-pay

## 2019-11-18 NOTE — Telephone Encounter (Signed)
Patient was called on all phone numbers listed in chart multiple times. No phone calls were answered. Left voicemail on son phone to call back office and reschedule appointment.

## 2019-11-20 ENCOUNTER — Other Ambulatory Visit: Payer: Self-pay

## 2019-11-20 ENCOUNTER — Other Ambulatory Visit: Payer: Self-pay | Admitting: Nurse Practitioner

## 2019-11-20 ENCOUNTER — Other Ambulatory Visit: Payer: Medicare Other

## 2019-11-20 DIAGNOSIS — G245 Blepharospasm: Secondary | ICD-10-CM | POA: Diagnosis not present

## 2019-11-20 DIAGNOSIS — E119 Type 2 diabetes mellitus without complications: Secondary | ICD-10-CM

## 2019-11-20 NOTE — Telephone Encounter (Signed)
RX approved on Monday 11/17/2019

## 2019-11-20 NOTE — Telephone Encounter (Signed)
Patient requesting refill for Clonzepam. Checked media and patient does have updated controlled medication contract. Routing to provider for approval. Verified correct pharmacy.

## 2019-12-02 ENCOUNTER — Other Ambulatory Visit: Payer: Medicare Other

## 2019-12-02 ENCOUNTER — Inpatient Hospital Stay: Admission: RE | Admit: 2019-12-02 | Payer: Medicare Other | Source: Ambulatory Visit

## 2019-12-04 ENCOUNTER — Ambulatory Visit (INDEPENDENT_AMBULATORY_CARE_PROVIDER_SITE_OTHER): Payer: Medicare Other | Admitting: *Deleted

## 2019-12-04 DIAGNOSIS — I495 Sick sinus syndrome: Secondary | ICD-10-CM

## 2019-12-04 LAB — CUP PACEART REMOTE DEVICE CHECK
Date Time Interrogation Session: 20210121070830
Implantable Lead Implant Date: 20070830
Implantable Lead Implant Date: 20070830
Implantable Lead Location: 753859
Implantable Lead Location: 753860
Implantable Pulse Generator Implant Date: 20151113
Lead Channel Setting Pacing Amplitude: 1.5 V
Lead Channel Setting Pacing Amplitude: 3.6 V
Lead Channel Setting Pacing Pulse Width: 0.4 ms
Pulse Gen Model: 394931
Pulse Gen Serial Number: 68408118

## 2019-12-04 LAB — TEST AUTHORIZATION

## 2019-12-04 LAB — PAIN MGMT, PROFILE 1 W/O CONF, U
Amphetamines: POSITIVE ng/mL
Barbiturates: NEGATIVE ng/mL
Benzodiazepines: POSITIVE ng/mL
Cocaine Metabolite: NEGATIVE ng/mL
Creatinine: 219.5 mg/dL
Marijuana Metabolite: NEGATIVE ng/mL
Methadone Metabolite: NEGATIVE ng/mL
Opiates: NEGATIVE ng/mL
Oxidant: NEGATIVE ug/mL
Oxycodone: NEGATIVE ng/mL
Phencyclidine: NEGATIVE ng/mL
pH: 5 (ref 4.5–9.0)

## 2019-12-04 LAB — PAIN MGMT, AMPHETAMINES QN, U

## 2019-12-10 ENCOUNTER — Other Ambulatory Visit: Payer: Self-pay | Admitting: *Deleted

## 2019-12-10 DIAGNOSIS — I10 Essential (primary) hypertension: Secondary | ICD-10-CM

## 2019-12-10 MED ORDER — LOSARTAN POTASSIUM 100 MG PO TABS: 100.0000 mg | ORAL_TABLET | Freq: Every day | ORAL | 1 refills | Status: DC

## 2019-12-23 ENCOUNTER — Other Ambulatory Visit: Payer: Self-pay

## 2019-12-23 DIAGNOSIS — G245 Blepharospasm: Secondary | ICD-10-CM

## 2019-12-23 NOTE — Telephone Encounter (Signed)
Will hold and discuss at pending appointment on 12/24/2019

## 2019-12-23 NOTE — Telephone Encounter (Signed)
Patient has pending appointment tomorrow.

## 2019-12-24 ENCOUNTER — Ambulatory Visit: Payer: Medicare Other | Admitting: Nurse Practitioner

## 2019-12-24 MED ORDER — CLONAZEPAM 1 MG PO TABS: ORAL_TABLET | ORAL | 0 refills | Status: DC

## 2019-12-24 NOTE — Telephone Encounter (Signed)
Patient did not show for appointment today. Please review request and advise

## 2020-01-21 ENCOUNTER — Encounter: Payer: Self-pay | Admitting: Family

## 2020-01-21 ENCOUNTER — Other Ambulatory Visit: Payer: Self-pay

## 2020-01-21 ENCOUNTER — Ambulatory Visit (INDEPENDENT_AMBULATORY_CARE_PROVIDER_SITE_OTHER): Payer: Medicare Other | Admitting: Family

## 2020-01-21 DIAGNOSIS — G245 Blepharospasm: Secondary | ICD-10-CM | POA: Diagnosis not present

## 2020-01-21 MED ORDER — CLONAZEPAM 1 MG PO TABS: ORAL_TABLET | ORAL | 0 refills | Status: DC

## 2020-01-21 NOTE — Progress Notes (Signed)
Patient ID: Mary Higgins, female   DOB: Jul 31, 1941, 79 y.o.   MRN: [id number] This service is provided via telemedicine  No vital signs collected/recorded due to the encounter was a telemedicine visit.   Location of patient (ex: home, work):  HOME  Patient consents to a telephone visit:  YES  Location of the provider (ex: office, home):  OFFICE   Name of any referring provider:  Sherrie Mustache, NP   Names of all persons participating in the telemedicine service and their role in the encounter:  PATIENT, [name], [place], Marlowe Sax, NP  Time spent on call:  5:16    Provider: Chailyn Racette FNP-C  [name], NP  Patient Care Team: [name], NP as PCP - General (Geriatric Medicine) Patsey Berthold, NP as Nurse Practitioner (Cardiology) Darleen Crocker, MD as Consulting Physician (Ophthalmology) Thompson Grayer, MD as Consulting Physician (Cardiology) Otelia Sergeant, OD as Referring Physician (Ophthalmology)  Extended Emergency Contact Information Primary Emergency Contact: Stanley,Joseph Address: [phone]          [place] Phone: [phone] Relation: Son Secondary Emergency Contact: Brantley Fling [place] of [place] Phone: [phone] Relation: Daughter  Code Status: Full Code  Goals of care: Advanced Directive information Advanced Directives 09/24/2019  Does Patient Have a Medical Advance Directive? No  Does patient want to make changes to medical advance directive? -  Would patient like information on creating a medical advance directive? Yes (MAU/Ambulatory/Procedural Areas - Information given)     Chief Complaint  Patient presents with  . Acute Visit    HPI:  Pt is a 79 y.o. female seen today for an acute visit for medication refill.she states has run out of her clonazepam.states was advised by PCP Dani Gobble to come to the office for her medication refill but states  does not have anyone to bring her to her visit today.she states her son had COVID-19 and has heart problem requiring to wear a battery.Her daughter lives out of town.she states will make arrangement for her next medication refill to come to the office.she takes Clonazepam 1 mg tablet one by mouth three times daily.Order states as needed but has takes three times daily for her Blepharospasm.she states has had x 5 surgeries and injection on her eye.she follows up with Ophthalmology at [facility] last injection given one year ago.she denies any increase sedation or fall episode.   I've discussed with patient option to fill out paper work for [employer] bus application if she has transportation issues for office visit.On chart review,patient has had multiple no shows for visit.Patient made aware that medication will not be filled on the next visit without being seeing at the office for compliance with control substance use.she verbalized understanding will make her next appointment today.   Past Medical History:  Diagnosis Date  . Anxiety and depression    Hx of  . Blepharospasm    . Breast cancer ([place])    s/p lumpectomy  . Carotid bruit    Bilateral. Mild to moderate disease on LEFT and mild on the RIGHT in 2012  . COPD (chronic obstructive pulmonary disease) ([place])   . Diabetes mellitus without complication ([place])    AODM  . Fuchs' corneal dystrophy   . GERD (gastroesophageal reflux disease)    . Hyperlipidemia   . Hypertension    . Insomnia    . Intracerebral bleed ([place]) 2003   Remote intracerebral blled and coiling by  Dr. Estanislado Pandy for cerebral aneurysm ans a shunt placed by Dr. Vertell Limber remotely. No seizure disorder and this happened in 2003 with an anterior communicating aneurysm.  Marland Kitchen Neurocardiogenic syncope    a. s/p Biotronik dual chamber pacemaker  . Personal history of radiation therapy   . Sinus node dysfunction (HCC)    . Tobacco abuse    . Type II or unspecified type diabetes mellitus without  mention of complication, not stated as uncontrolled     Past Surgical History:  Procedure Laterality Date  . ANEURYSM COILING  2003   Cerebral aneurysm ;Dr. Luanne Bras  . BREAST LUMPECTOMY Right    For cancer, no recurrence  . CHOLECYSTECTOMY    . PACEMAKER GENERATOR CHANGE N/A 09/25/2014   BTK dual chamber pacemkaer implanted by Dr Rayann Heman  . PACEMAKER INSERTION  07/12/2006   St. Jude Victory XL DR  -  Dr. Tami Ribas  . PARTIAL HYSTERECTOMY    . VENTRICULOPERITONEAL SHUNT  2003   Dr. Erline Levine    Allergies  Allergen Reactions  . Oxaprozin Rash    Outpatient Encounter Medications as of 01/21/2020  Medication Sig  . aspirin EC 325 MG tablet Take 325 mg by mouth daily.  Marland Kitchen atorvastatin (LIPITOR) 40 MG tablet TAKE 1 TABLET BY MOUTH EVERY DAY FOR CHOLESTEROL  . buPROPion (WELLBUTRIN SR) 150 MG 12 hr tablet TAKE 1 TABLET BY MOUTH TWICE DAILY  . carboxymethylcellulose (REFRESH PLUS) 0.5 % SOLN Place 1 drop into both eyes as needed.  . clonazePAM (KLONOPIN) 1 MG tablet TAKE 1 TABLET(1 MG) BY MOUTH THREE TIMES DAILY AS NEEDED  . losartan (COZAAR) 100 MG tablet Take 1 tablet (100 mg total) by mouth daily.  . metFORMIN (GLUCOPHAGE) 500 MG tablet TAKE 1 TABLET BY MOUTH WITH BREAKFAST AND WITH SUPPER FOR DIABETES  . metoprolol tartrate (LOPRESSOR) 50 MG tablet TAKE 1 TABLET BY MOUTH TWICE DAILY  . pantoprazole (PROTONIX) 40 MG tablet TAKE 1 TABLET BY MOUTH TWICE DAILY  . Polyethyl Glycol-Propyl Glycol (SYSTANE) 0.4-0.3 % SOLN Apply to eye as needed.  . sertraline (ZOLOFT) 50 MG tablet Take 1 tablet (50 mg total) by mouth daily.  Marland Kitchen tetrahydrozoline (VISINE) 0.05 % ophthalmic solution as needed.   No facility-administered encounter medications on file as of 01/21/2020.    Review of Systems  Constitutional: Negative for appetite change, chills, fatigue and fever.  HENT: Negative for congestion, postnasal drip, rhinorrhea, sinus pressure, sinus pain, sneezing and sore throat.   Eyes:  Negative for pain, discharge, redness and itching.       Eye spasm follows up with Opthalmology   Respiratory: Negative for cough, chest tightness, shortness of breath and wheezing.   Cardiovascular: Negative for chest pain, palpitations and leg swelling.  Skin: Negative for color change, pallor and rash.  Neurological: Negative for dizziness, speech difficulty and light-headedness.  Psychiatric/Behavioral: Negative for agitation and sleep disturbance. The patient is not nervous/anxious.     Immunization History  Administered Date(s) Administered  . Influenza, High Dose Seasonal PF 07/11/2017, 10/01/2018, 09/16/2019  . Influenza,inj,Quad PF,6+ Mos 08/18/2015, 12/13/2016  . Pneumococcal Conjugate-13 08/18/2015  . Pneumococcal Polysaccharide-23 07/11/2017  . Zoster 03/16/2015  . Zoster Recombinat (Shingrix) 08/21/2017, 02/05/2018   Pertinent  Health Maintenance Due  Topic Date Due  . FOOT EXAM  10/02/2019  . OPHTHALMOLOGY EXAM  11/22/2019  . HEMOGLOBIN A1C  03/23/2020  . INFLUENZA VACCINE  Completed  . DEXA SCAN  Completed  . PNA vac Low Risk Adult  Completed  Fall Risk  09/24/2019 06/11/2019 05/19/2019 03/05/2019 02/17/2019  Falls in the past year? 1 1 0 1 1  Number falls in past yr: 1 1 0 1 1  Comment - - - - -  Injury with Fall? 1 0 0 0 0  Comment knot on head - - - -  Risk for fall due to : History of fall(s) - - - History of fall(s)   There were no vitals filed for this visit. There is no height or weight on file to calculate BMI. Physical Exam Unable to complete on Telephone visit   Labs reviewed: Recent Labs    03/06/19 0838 09/24/19 1027  NA 138 140  K 4.7 4.0  CL 105 105  CO2 25 28  GLUCOSE 119* 113*  BUN 17 12  CREATININE 0.97* 0.74  CALCIUM 9.2 8.9   Recent Labs    03/06/19 0838 09/24/19 1027  AST 10 11  ALT 8 9  BILITOT 0.6 0.8  PROT 5.9* 6.2   Recent Labs    09/24/19 1027  WBC 7.0  NEUTROABS 5,852  HGB 11.6*  HCT 35.4  MCV 86.1  PLT 279    Lab Results  Component Value Date   TSH 1.74 10/01/2018   Lab Results  Component Value Date   HGBA1C 6.3 (H) 09/24/2019   Lab Results  Component Value Date   CHOL 104 03/06/2019   HDL 45 (L) 03/06/2019   LDLCALC 43 03/06/2019   TRIG 75 03/06/2019   CHOLHDL 2.3 03/06/2019    Significant Diagnostic Results in last 30 days:  No results found.  Assessment/Plan  Blepharospasm Symptoms controlled with clonazepam.Takes three times daily.unable to wean off this visit. - Advised to make office appointment.will need a urine drug test next visit.she is aware no clonazepam will be prescribed if she does not come for office visit  - Fall and safety precaution when taking clonazepam and overdose risk discussed  verbalized understanding.  - clonazePAM (KLONOPIN) 1 MG tablet; TAKE 1 TABLET(1 MG) BY MOUTH THREE TIMES DAILY AS NEEDED  Dispense: 90 tablet; Refill: 0 - Encouraged to notify provider if having transportation issues will need to apply for [employer] bus   Family/ staff Communication: Reviewed plan of care with patient verbalized understanding.   Labs/tests ordered: None   Next Appointment: 1 months with PCP for medical management of chronic issues and urine drug test.   Spent 12  minutes of non-face to face with patient    Sandrea Hughs, NP

## 2020-01-21 NOTE — Patient Instructions (Addendum)
-   Clonazepam refilled this visit  But you will need to come to the office for your next refill.No Clonazepam will be prescribed until you are seen at the office to comply with Narcotic use. - .Recommended applying for [employer] bus if still having problems with transportation.

## 2020-02-11 ENCOUNTER — Other Ambulatory Visit: Payer: Self-pay | Admitting: *Deleted

## 2020-02-11 MED ORDER — PANTOPRAZOLE SODIUM 40 MG PO TBEC: 40.0000 mg | DELAYED_RELEASE_TABLET | Freq: Two times a day (BID) | ORAL | 1 refills | Status: DC

## 2020-02-11 NOTE — Telephone Encounter (Signed)
[facility].

## 2020-02-12 ENCOUNTER — Other Ambulatory Visit: Payer: Self-pay | Admitting: Family

## 2020-02-12 DIAGNOSIS — G245 Blepharospasm: Secondary | ICD-10-CM

## 2020-02-12 NOTE — Telephone Encounter (Signed)
Last filled 01/21/2020, too soon

## 2020-02-18 ENCOUNTER — Encounter: Payer: Self-pay | Admitting: Nurse Practitioner

## 2020-02-18 ENCOUNTER — Other Ambulatory Visit: Payer: Self-pay

## 2020-02-18 ENCOUNTER — Ambulatory Visit (INDEPENDENT_AMBULATORY_CARE_PROVIDER_SITE_OTHER): Payer: Medicare Other | Admitting: Nurse Practitioner

## 2020-02-18 VITALS — BP 120/80 | HR 70 | Temp 97.5°F | Resp 16 | Ht 69.0 in | Wt 179.0 lb

## 2020-02-18 DIAGNOSIS — E119 Type 2 diabetes mellitus without complications: Secondary | ICD-10-CM

## 2020-02-18 DIAGNOSIS — I609 Nontraumatic subarachnoid hemorrhage, unspecified: Secondary | ICD-10-CM

## 2020-02-18 DIAGNOSIS — G4700 Insomnia, unspecified: Secondary | ICD-10-CM

## 2020-02-18 DIAGNOSIS — R197 Diarrhea, unspecified: Secondary | ICD-10-CM

## 2020-02-18 DIAGNOSIS — I10 Essential (primary) hypertension: Secondary | ICD-10-CM

## 2020-02-18 DIAGNOSIS — G245 Blepharospasm: Secondary | ICD-10-CM

## 2020-02-18 DIAGNOSIS — E782 Mixed hyperlipidemia: Secondary | ICD-10-CM

## 2020-02-18 DIAGNOSIS — F324 Major depressive disorder, single episode, in partial remission: Secondary | ICD-10-CM

## 2020-02-18 DIAGNOSIS — Z79899 Other long term (current) drug therapy: Secondary | ICD-10-CM

## 2020-02-18 DIAGNOSIS — K219 Gastro-esophageal reflux disease without esophagitis: Secondary | ICD-10-CM

## 2020-02-18 MED ORDER — SERTRALINE HCL 100 MG PO TABS: 100.0000 mg | ORAL_TABLET | Freq: Every day | ORAL | 0 refills | Status: DC

## 2020-02-18 MED ORDER — BUPROPION HCL ER (SR) 150 MG PO TB12: 150.0000 mg | ORAL_TABLET | Freq: Every day | ORAL | 1 refills | Status: DC

## 2020-02-18 NOTE — Progress Notes (Signed)
Careteam: Patient Care Team: [name], NP as PCP - General (Geriatric Medicine) Patsey Berthold, NP as Nurse Practitioner (Cardiology) Darleen Crocker, MD as Consulting Physician (Ophthalmology) Thompson Grayer, MD as Consulting Physician (Cardiology) Otelia Sergeant, OD as Referring Physician (Ophthalmology)  PLACE OF SERVICE:  [place] Directive information Does Patient Have a Medical Advance Directive?: No  Allergies  Allergen Reactions  . Oxaprozin Rash    Chief Complaint  Patient presents with  . Follow-up    4 Month Follow Up  . Health Maintenance    Discuss the need for Foot and Eye Exam  . Immunizations    Discuss the need for Tetanus Vaccine.     HPI: Patient is a 79 y.o. female for follow up. Pt has frequently missed follow up appts due to transportation issues, discussed filling out [employer] paperwork at last visit. Son reports they will look into it.   Pt with blepharospasm controlled with clonazepam- due for UDS. Worse with pollen  Subarachnoid hemorrhage- was having worsening headaches back in November, ordered CT head but she never completed.  Reports ongoing headaches.   DM-continues on metformin 500 mg BID  HTN- continues on lopressor and losartan  Depression- continues on zoloft 50 mg daily with Wellbutrin 150 mg BID, mood is not great due to son being very sick. He had COVID and now has a heart pump. She says it is "rough"  Not sleeping at night, gets in the bed at lays there and gets up and watch tv. Reports she is not sleeping during the day.  Does not watch tv in her bed. Drinks cup of coffee in the mornings only.   GERD- controlled. But having trouble with diarrhea- has had incontinence of bowel has been going on over a year and getting worse Only using protonix once daily Has abdominal discomfort when she has diarrhea- taking imodium which has helped. In the last week she has had 2 episodes of diarrhea only 1 episode a day.  Will then take imodium. Also will have regular BM between.  Review of Systems:  Review of Systems  Constitutional: Negative for chills, fever and weight loss.  HENT: Negative for tinnitus.   Respiratory: Negative for cough, sputum production and shortness of breath.   Cardiovascular: Negative for chest pain, palpitations and leg swelling.  Gastrointestinal: Negative for abdominal pain, constipation, diarrhea and heartburn.  Genitourinary: Negative for dysuria, frequency and urgency.  Musculoskeletal: Negative for back pain, falls, joint pain and myalgias.  Skin: Negative.   Neurological: Negative for dizziness and headaches.  Psychiatric/Behavioral: Positive for depression. Negative for memory loss. The patient is nervous/anxious and has insomnia.     Past Medical History:  Diagnosis Date  . Anxiety and depression    Hx of  . Blepharospasm    . Breast cancer ([place])    s/p lumpectomy  . Carotid bruit    Bilateral. Mild to moderate disease on LEFT and mild on the RIGHT in 2012  . COPD (chronic obstructive pulmonary disease) ([place])   . Diabetes mellitus without complication ([place])    AODM  . Fuchs' corneal dystrophy   . GERD (gastroesophageal reflux disease)    . Hyperlipidemia   . Hypertension    . Insomnia    . Intracerebral bleed ([place]) 2003   Remote intracerebral blled and coiling by Dr. Estanislado Pandy for cerebral aneurysm ans a shunt placed by Dr. Vertell Limber remotely. No seizure disorder and this happened in 2003 with an anterior  communicating aneurysm.  Marland Kitchen Neurocardiogenic syncope    a. s/p Biotronik dual chamber pacemaker  . Personal history of radiation therapy   . Sinus node dysfunction (HCC)    . Tobacco abuse    . Type II or unspecified type diabetes mellitus without mention of complication, not stated as uncontrolled     Past Surgical History:  Procedure Laterality Date  . ANEURYSM COILING  2003   Cerebral aneurysm ;Dr. Luanne Bras  . BREAST LUMPECTOMY Right    For  cancer, no recurrence  . CHOLECYSTECTOMY    . PACEMAKER GENERATOR CHANGE N/A 09/25/2014   BTK dual chamber pacemkaer implanted by Dr Rayann Heman  . PACEMAKER INSERTION  07/12/2006   St. Jude Victory XL DR  -  Dr. Tami Ribas  . PARTIAL HYSTERECTOMY    . VENTRICULOPERITONEAL SHUNT  2003   Dr. Erline Levine   Social History:   reports that she has quit smoking. Her smoking use included cigarettes. She started smoking about 13 months ago. She has a 7.50 pack-year smoking history. She has never used smokeless tobacco. She reports that she does not drink alcohol or use drugs.  Family History  Problem Relation Age of Onset  . Cancer Mother   . Heart attack Father   . Heart attack Son   . Hypertension Other     Medications: Patient's Medications  New Prescriptions   No medications on file  Previous Medications   ASPIRIN EC 325 MG TABLET    Take 325 mg by mouth daily.   ATORVASTATIN (LIPITOR) 40 MG TABLET    TAKE 1 TABLET BY MOUTH EVERY DAY FOR CHOLESTEROL   BUPROPION (WELLBUTRIN SR) 150 MG 12 HR TABLET    TAKE 1 TABLET BY MOUTH TWICE DAILY   CLONAZEPAM (KLONOPIN) 1 MG TABLET    TAKE 1 TABLET(1 MG) BY MOUTH THREE TIMES DAILY AS NEEDED   LOSARTAN (COZAAR) 100 MG TABLET    Take 1 tablet (100 mg total) by mouth daily.   METFORMIN (GLUCOPHAGE) 500 MG TABLET    TAKE 1 TABLET BY MOUTH WITH BREAKFAST AND WITH SUPPER FOR DIABETES   METOPROLOL TARTRATE (LOPRESSOR) 50 MG TABLET    TAKE 1 TABLET BY MOUTH TWICE DAILY   PANTOPRAZOLE (PROTONIX) 40 MG TABLET    Take 1 tablet (40 mg total) by mouth 2 (two) times daily.   POLYETHYL GLYCOL-PROPYL GLYCOL (SYSTANE) 0.4-0.3 % SOLN    Apply to eye as needed.   SERTRALINE (ZOLOFT) 50 MG TABLET    Take 1 tablet (50 mg total) by mouth daily.  Modified Medications   No medications on file  Discontinued Medications   CARBOXYMETHYLCELLULOSE (REFRESH PLUS) 0.5 % SOLN    Place 1 drop into both eyes as needed.   TETRAHYDROZOLINE (VISINE) 0.05 % OPHTHALMIC SOLUTION    as  needed.    Physical Exam:  Vitals:   02/18/20 1414  BP: 120/80  Pulse: 70  Resp: 16  Temp: (!) 97.5 F (36.4 C)  SpO2: 96%  Weight: 179 lb (81.2 kg)  Height: '5\' 9"'$  (1.753 m)   Body mass index is 26.43 kg/m. Wt Readings from Last 3 Encounters:  02/18/20 179 lb (81.2 kg)  10/13/19 189 lb (85.7 kg)  09/24/19 189 lb (85.7 kg)    Physical Exam Constitutional:      General: She is not in acute distress.    Appearance: She is well-developed. She is not diaphoretic.  HENT:     Head: Normocephalic and atraumatic.  Eyes:  Conjunctiva/sclera: Conjunctivae normal.     Pupils: Pupils are equal, round, and reactive to light.  Cardiovascular:     Rate and Rhythm: Normal rate and regular rhythm.     Heart sounds: Normal heart sounds.  Pulmonary:     Effort: Pulmonary effort is normal.     Breath sounds: Normal breath sounds.  Abdominal:     General: Bowel sounds are normal.     Palpations: Abdomen is soft.  Musculoskeletal:     Cervical back: Normal range of motion and neck supple.     Right lower leg: No edema.     Left lower leg: No edema.  Skin:    General: Skin is warm and dry.  Neurological:     Mental Status: She is alert and oriented to person, place, and time.    Labs reviewed: Basic Metabolic Panel: Recent Labs    03/06/19 0838 09/24/19 1027  NA 138 140  K 4.7 4.0  CL 105 105  CO2 25 28  GLUCOSE 119* 113*  BUN 17 12  CREATININE 0.97* 0.74  CALCIUM 9.2 8.9   Liver Function Tests: Recent Labs    03/06/19 0838 09/24/19 1027  AST 10 11  ALT 8 9  BILITOT 0.6 0.8  PROT 5.9* 6.2   No results for input(s): LIPASE, AMYLASE in the last 8760 hours. No results for input(s): AMMONIA in the last 8760 hours. CBC: Recent Labs    09/24/19 1027  WBC 7.0  NEUTROABS 5,852  HGB 11.6*  HCT 35.4  MCV 86.1  PLT 279   Lipid Panel: Recent Labs    03/06/19 0838  CHOL 104  HDL 45*  LDLCALC 43  TRIG 75  CHOLHDL 2.3   TSH: No results for input(s):  TSH in the last 8760 hours. A1C: Lab Results  Component Value Date   HGBA1C 6.3 (H) 09/24/2019     Assessment/Plan 1. Diarrhea, unspecified type -ongoing for a year, episodes of diarrhea with normal bowel movements. Possible medication related, doubt infectious due to the duration. To add benefit daily to see if this helps bulk stool -encouraged to take metformin with food at this time. - Ambulatory referral to Gastroenterology - Amylase - Lipase - CMP with eGFR([facility]) - CBC with Differential/Platelet - TSH  2. Blepharospasm -ongoing, continues on clonazepam TID - Pain Mgmt, Profile 1 w/o Conf, U  3. Essential hypertension -stable on current regimen  - CMP with eGFR([facility]) - CBC with Differential/Platelet  4. Subarachnoid hemorrhage ([place]) -now with ongoing headaches, discussed this with pt and son at last office visit- she had CT head but never followed up with neurosurgery, still having headaches so recommended to schedule CT and make follow up at this time. There has been no changes in balance, memory, dizziness at this time.   5. Gastroesophageal reflux disease without esophagitis Controlled on protonix daily  6. Major depressive disorder with single episode, in partial remission (HCC) -will decrease Wellbutrin due to sleep issues, may be too stimulating - buPROPion (WELLBUTRIN SR) 150 MG 12 hr tablet; Take 1 tablet (150 mg total) by mouth daily.  Dispense: 90 tablet; Refill: 1  7. Mixed hyperlipidemia -continues on lipitor 40 mg daily, LDL at goal on last labs  8. Diabetes mellitus without complication ([place]) -continues on metformin, no hypoglycemia noted  -Encouraged dietary compliance, routine foot care/monitoring and to keep up with diabetic eye exams through ophthalmology  -is adamant against foot exam today (will not remove shoes) declines sores, states she is willing  to see a podiatrist to get her nails trimmed.  - Ambulatory referral to Podiatry - Hemoglobin  A1c  9. High risk medication use - Pain Mgmt, Profile 1 w/o Conf, U  11. Insomnia, unspecified type -discussed sleep hygiene, will also reduce Wellbutrin to see if this helps sleep. Lots of health issues with her son which is likely contributing.   Next appt: 6 weeks, sooner if needed  Loyd Marhefka K. [place], [facility] [phone]

## 2020-02-18 NOTE — Patient Instructions (Addendum)
CT of the head has been ordered- to call [facility] to Schedule AFTER you get this CT done you need to call and make appt with neurosurgery for visit and results It is your responsibility to call their office and make appt NEUROSURGERY OFFICE Address: [address], [place], [place] [address] Phone: [phone]   To reduce wellbutrin to once daily Increase zoloft to 100 mg daily   Add benefiber or metamucil 1-2 times daily to help bulk stool  To do food log and note when you have episodes of diarrhea to see if there is any pattern to a food or time.

## 2020-02-19 ENCOUNTER — Other Ambulatory Visit: Payer: Self-pay | Admitting: Nurse Practitioner

## 2020-02-19 ENCOUNTER — Telehealth: Payer: Self-pay

## 2020-02-19 DIAGNOSIS — E871 Hypo-osmolality and hyponatremia: Secondary | ICD-10-CM

## 2020-02-19 LAB — COMPLETE METABOLIC PANEL WITH GFR
AG Ratio: 1.9 (calc) (ref 1.0–2.5)
ALT: 10 U/L (ref 6–29)
AST: 10 U/L (ref 10–35)
Albumin: 4.1 g/dL (ref 3.6–5.1)
Alkaline phosphatase (APISO): 73 U/L (ref 37–153)
BUN: 17 mg/dL (ref 7–25)
CO2: 26 mmol/L (ref 20–32)
Calcium: 9.3 mg/dL (ref 8.6–10.4)
Chloride: 99 mmol/L (ref 98–110)
Creat: 0.83 mg/dL (ref 0.60–0.93)
GFR, Est African American: 78 mL/min/{1.73_m2} (ref >=60)
GFR, Est Non African American: 68 mL/min/{1.73_m2} (ref >=60)
Globulin: 2.2 g/dL (calc) (ref 1.9–3.7)
Glucose, Bld: 97 mg/dL (ref 65–139)
Potassium: 5.2 mmol/L (ref 3.5–5.3)
Sodium: 132 mmol/L — ABNORMAL LOW (ref 135–146)
Total Bilirubin: 0.7 mg/dL (ref 0.2–1.2)
Total Protein: 6.3 g/dL (ref 6.1–8.1)

## 2020-02-19 LAB — CBC WITH DIFFERENTIAL/PLATELET
Absolute Monocytes: 548 cells/uL (ref 200–950)
Basophils Absolute: 59 cells/uL (ref 0–200)
Basophils Relative: 0.8 %
Eosinophils Absolute: 89 cells/uL (ref 15–500)
Eosinophils Relative: 1.2 %
HCT: 37.3 % (ref 35.0–45.0)
Hemoglobin: 11.9 g/dL (ref 11.7–15.5)
Lymphs Abs: 2013 cells/uL (ref 850–3900)
MCH: 27.5 pg (ref 27.0–33.0)
MCHC: 31.9 g/dL — ABNORMAL LOW (ref 32.0–36.0)
MCV: 86.3 fL (ref 80.0–100.0)
MPV: 10.5 fL (ref 7.5–12.5)
Monocytes Relative: 7.4 %
Neutro Abs: 4692 cells/uL (ref 1500–7800)
Neutrophils Relative %: 63.4 %
Platelets: 274 10*3/uL (ref 140–400)
RBC: 4.32 10*6/uL (ref 3.80–5.10)
RDW: 13.8 % (ref 11.0–15.0)
Total Lymphocyte: 27.2 %
WBC: 7.4 10*3/uL (ref 3.8–10.8)

## 2020-02-19 LAB — PAIN MGMT, PROFILE 1 W/O CONF, U
Amphetamines: NEGATIVE ng/mL
Barbiturates: NEGATIVE ng/mL
Benzodiazepines: POSITIVE ng/mL
Cocaine Metabolite: NEGATIVE ng/mL
Creatinine: 93.9 mg/dL
Marijuana Metabolite: NEGATIVE ng/mL
Methadone Metabolite: NEGATIVE ng/mL
Opiates: NEGATIVE ng/mL
Oxidant: NEGATIVE ug/mL
Oxycodone: NEGATIVE ng/mL
Phencyclidine: NEGATIVE ng/mL
pH: 5.4 (ref 4.5–9.0)

## 2020-02-19 LAB — HEMOGLOBIN A1C
Hgb A1c MFr Bld: 6.2 % of total Hgb — ABNORMAL HIGH (ref <5.7)
Mean Plasma Glucose: 131 (calc)
eAG (mmol/L): 7.3 (calc)

## 2020-02-19 LAB — LIPASE: Lipase: 21 U/L (ref 7–60)

## 2020-02-19 LAB — TSH: TSH: 2.23 mIU/L (ref 0.40–4.50)

## 2020-02-19 LAB — AMYLASE: Amylase: 28 U/L (ref 21–101)

## 2020-02-19 MED ORDER — PANTOPRAZOLE SODIUM 40 MG PO TBEC: 40.0000 mg | DELAYED_RELEASE_TABLET | Freq: Every day | ORAL | 1 refills | Status: DC

## 2020-02-19 MED ORDER — SERTRALINE HCL 50 MG PO TABS: 50.0000 mg | ORAL_TABLET | Freq: Every day | ORAL | 3 refills | Status: DC

## 2020-02-19 NOTE — Telephone Encounter (Signed)
Discussed results with patient, patient verbalized understanding of results   Patient states she will have to check with her son prior to scheduling sodium follow-up for next week. Patients son recently had open heart surgery, he has a lot of follow-up appointments of his own, and he is her source of transportation.   Patient will call back to schedule non fasting lab appointment for next week   Future order placed for BMP

## 2020-02-19 NOTE — Telephone Encounter (Signed)
-----   Message from [name], NP sent at 02/19/2020  8:46 AM EDT ----- Sodium level is slightly low, could be due to a recent bout of diarrhea but due to this would not recommend increasing zoloft at this time as this can make sodium levels worse. Otherwise lab work stable.   Lets continue zoloft at 50 mg, and follow up sodium level next week to make sure it is stable

## 2020-02-20 ENCOUNTER — Other Ambulatory Visit: Payer: Self-pay | Admitting: Family

## 2020-02-20 DIAGNOSIS — G245 Blepharospasm: Secondary | ICD-10-CM

## 2020-02-20 NOTE — Telephone Encounter (Signed)
Last filled on 01/21/2020  Non-opioid treatment agreement on file from 10/13/2019

## 2020-02-26 ENCOUNTER — Other Ambulatory Visit: Payer: Self-pay

## 2020-02-26 ENCOUNTER — Other Ambulatory Visit: Payer: Medicare Other

## 2020-02-26 DIAGNOSIS — E871 Hypo-osmolality and hyponatremia: Secondary | ICD-10-CM | POA: Diagnosis not present

## 2020-02-27 LAB — BASIC METABOLIC PANEL
BUN/Creatinine Ratio: 33 (calc) — ABNORMAL HIGH (ref 6–22)
BUN: 26 mg/dL — ABNORMAL HIGH (ref 7–25)
CO2: 24 mmol/L (ref 20–32)
Calcium: 9.4 mg/dL (ref 8.6–10.4)
Chloride: 101 mmol/L (ref 98–110)
Creat: 0.8 mg/dL (ref 0.60–0.93)
Glucose, Bld: 101 mg/dL — ABNORMAL HIGH (ref 65–99)
Potassium: 4.6 mmol/L (ref 3.5–5.3)
Sodium: 135 mmol/L (ref 135–146)

## 2020-03-03 ENCOUNTER — Encounter: Payer: Self-pay | Admitting: Physician Assistant

## 2020-03-04 ENCOUNTER — Ambulatory Visit (INDEPENDENT_AMBULATORY_CARE_PROVIDER_SITE_OTHER): Payer: Medicare Other | Admitting: *Deleted

## 2020-03-04 DIAGNOSIS — I495 Sick sinus syndrome: Secondary | ICD-10-CM | POA: Diagnosis not present

## 2020-03-04 LAB — CUP PACEART REMOTE DEVICE CHECK
Battery Remaining Percentage: 65 %
Brady Statistic RA Percent Paced: 31 %
Brady Statistic RV Percent Paced: 0 %
Date Time Interrogation Session: 20210421123609
Implantable Lead Implant Date: 20070830
Implantable Lead Implant Date: 20070830
Implantable Lead Location: 753859
Implantable Lead Location: 753860
Implantable Pulse Generator Implant Date: 20151113
Lead Channel Impedance Value: 332 Ohm
Lead Channel Impedance Value: 605 Ohm
Lead Channel Pacing Threshold Amplitude: 0.6 V
Lead Channel Pacing Threshold Amplitude: 2.3 V
Lead Channel Pacing Threshold Pulse Width: 0.4 ms
Lead Channel Pacing Threshold Pulse Width: 0.4 ms
Lead Channel Sensing Intrinsic Amplitude: 3.2 mV
Lead Channel Sensing Intrinsic Amplitude: 9.5 mV
Lead Channel Setting Pacing Amplitude: 1.5 V
Lead Channel Setting Pacing Amplitude: 3.6 V
Lead Channel Setting Pacing Pulse Width: 0.4 ms
Pulse Gen Model: 394931
Pulse Gen Serial Number: 68408118

## 2020-03-05 NOTE — Progress Notes (Signed)
PPM Remote  

## 2020-03-12 ENCOUNTER — Other Ambulatory Visit: Payer: Self-pay | Admitting: *Deleted

## 2020-03-12 DIAGNOSIS — F324 Major depressive disorder, single episode, in partial remission: Secondary | ICD-10-CM

## 2020-03-12 MED ORDER — BUPROPION HCL ER (SR) 150 MG PO TB12: 150.0000 mg | ORAL_TABLET | Freq: Every day | ORAL | 1 refills | Status: DC

## 2020-03-12 NOTE — Telephone Encounter (Signed)
[facility]

## 2020-03-15 ENCOUNTER — Other Ambulatory Visit (INDEPENDENT_AMBULATORY_CARE_PROVIDER_SITE_OTHER): Payer: Medicare Other

## 2020-03-15 ENCOUNTER — Encounter: Payer: Self-pay | Admitting: Physician Assistant

## 2020-03-15 ENCOUNTER — Ambulatory Visit: Payer: Medicare Other | Admitting: Physician Assistant

## 2020-03-15 VITALS — BP 110/78 | HR 76 | Temp 96.3°F | Ht 71.0 in | Wt 180.4 lb

## 2020-03-15 DIAGNOSIS — R197 Diarrhea, unspecified: Secondary | ICD-10-CM | POA: Diagnosis not present

## 2020-03-15 LAB — TSH: TSH: 1.69 u[IU]/mL (ref 0.35–4.50)

## 2020-03-15 LAB — C-REACTIVE PROTEIN: CRP: <1 mg/dL (ref 0.5–20.0)

## 2020-03-15 LAB — SEDIMENTATION RATE: Sed Rate: 20 mm/hr (ref 0–30)

## 2020-03-15 NOTE — Progress Notes (Signed)
Subjective:    Patient ID: Mary Higgins, female    DOB: 1941-04-20, 79 y.o.   MRN: [id number]  HPI Mary Higgins is a pleasant 79 year old white female, new today but known very remotely to Dr. Fuller Plan from prior procedure in 1996.  She is referred today by Sherrie Mustache, NP for evaluation of diarrhea. Patient has history of hypertension, GERD, adult onset diabetes mellitus for which she is on oral agents including Glucophage, prior history of subarachnoid hemorrhage, depression, sinus node dysfunction for which she is status post pacemaker. She had undergone colonoscopy in 1996 with finding of tubular adenoma and 1 hyperplastic polyp and was also noted to have diverticulosis.  She says her symptoms of diarrhea have been present over the past 2 months.  Prior to that time she was having fairly normal bowel movements.  At present she is having 2-3 liquid stools per day, and had gotten to the point that she was having incontinence episodes due to urgency with the diarrhea.  She started taking 1 Imodium each morning and says that has helped quite a bit.  She is no longer having incontinence in fact can have fairly formed or soft stools with the Imodium.  If she stops taking the Imodium the diarrhea returns.  She has not noticed any melena or hematochezia.  She had been having some mild abdominal cramping.  Her appetite has been fine, weight has been stable. She had not been on any recent antibiotics.  No new dietary changes supplements etc.  She had been started on Zoloft for depression recently but says the diarrhea was present prior to starting the Zoloft.  She is also been on Glucophage long-term and has not had any recent dosage changes. She does feel that she has been under a lot of stress recently, her son had been hospitalized for a prolonged time and it sounds as if he now has an LVAD.Marland Kitchen  Review of Systems Pertinent positive and negative review of systems were noted in the above HPI section.  All  other review of systems was otherwise negative.  Outpatient Encounter Medications as of 03/15/2020  Medication Sig  . aspirin EC 325 MG tablet Take 325 mg by mouth daily.  Marland Kitchen atorvastatin (LIPITOR) 40 MG tablet TAKE 1 TABLET BY MOUTH EVERY DAY FOR CHOLESTEROL  . buPROPion (WELLBUTRIN SR) 150 MG 12 hr tablet Take 1 tablet (150 mg total) by mouth daily.  . clonazePAM (KLONOPIN) 1 MG tablet TAKE 1 TABLET(1 MG) BY MOUTH THREE TIMES DAILY AS NEEDED  . losartan (COZAAR) 100 MG tablet Take 1 tablet (100 mg total) by mouth daily.  . metFORMIN (GLUCOPHAGE) 500 MG tablet TAKE 1 TABLET BY MOUTH WITH BREAKFAST AND WITH SUPPER FOR DIABETES  . metoprolol tartrate (LOPRESSOR) 50 MG tablet TAKE 1 TABLET BY MOUTH TWICE DAILY  . pantoprazole (PROTONIX) 40 MG tablet Take 1 tablet (40 mg total) by mouth daily.  . sertraline (ZOLOFT) 50 MG tablet Take 1 tablet (50 mg total) by mouth daily.  . [DISCONTINUED] Polyethyl Glycol-Propyl Glycol (SYSTANE) 0.4-0.3 % SOLN Apply to eye as needed.   No facility-administered encounter medications on file as of 03/15/2020.   Allergies  Allergen Reactions  . Oxaprozin Rash   Patient Active Problem List   Diagnosis Date Noted  . Hyponatremia 02/07/2017  . Diarrhea 12/13/2016  . Actinic keratosis 12/13/2016  . Falls 12/13/2016  . Cervicalgia 08/18/2015  . Onychomycosis 01/20/2015  . Hyperlipidemia 01/20/2015  . Syncope and collapse 09/10/2014  . Insomnia 03/18/2014  .  Depression due to head injury 03/18/2014  . Flank pain 03/18/2014  . Blepharospasm 03/18/2014  . GERD (gastroesophageal reflux disease) 03/18/2014  . Hypertension 03/18/2014  . DM type 2 (diabetes mellitus, type 2) ([place]) 03/18/2014  . Breast mass 03/18/2014  . Abnormality of gait 03/18/2014  . History of fall 03/18/2014  . Other malaise and fatigue 03/18/2014  . Major depressive disorder with single episode 03/18/2014  . Sinus node dysfunction ([place]) 12/21/2013  . Tobacco abuse 12/21/2013  . Edema  12/21/2013  . Neurocardiogenic syncope   . Subarachnoid hemorrhage ([place]) 06/01/2002   Social History   Socioeconomic History  . Marital status: Legally Separated    Spouse name: Not on file  . Number of children: Not on file  . Years of education: Not on file  . Highest education level: Not on file  Occupational History  . Not on file  Tobacco Use  . Smoking status: Former Smoker    Packs/day: 0.50    Years: 15.00    Pack years: 7.50    Types: Cigarettes    Start date: 01/12/2019  . Smokeless tobacco: Never Used  . Tobacco comment: quit for 2 weeks as of 05/13/2018, has the occasional cigarrette  Substance and Sexual Activity  . Alcohol use: No    Alcohol/week: 0.0 standard drinks  . Drug use: No  . Sexual activity: Not Currently  Other Topics Concern  . Not on file  Social History Narrative  . Not on file   Social Determinants of Health   Financial Resource Strain:   . Difficulty of Paying Living Expenses:   Food Insecurity:   . Worried About Charity fundraiser in the Last Year:   . Arboriculturist in the Last Year:   Transportation Needs:   . Film/video editor (Medical):   Marland Kitchen Lack of Transportation (Non-Medical):   Physical Activity:   . Days of Exercise per Week:   . Minutes of Exercise per Session:   Stress:   . Feeling of Stress :   Social Connections:   . Frequency of Communication with Friends and Family:   . Frequency of Social Gatherings with Friends and Family:   . Attends Religious Services:   . Active Member of Clubs or Organizations:   . Attends Archivist Meetings:   Marland Kitchen Marital Status:   Intimate Partner Violence:   . Fear of Current or Ex-Partner:   . Emotionally Abused:   Marland Kitchen Physically Abused:   . Sexually Abused:     Mary Higgins's family history includes Cancer in her mother; Heart attack in her father and son; Hypertension in an other family member.      Objective:    Vitals:   03/15/20 1413  BP: 110/78  Pulse: 76    Temp: (!) 96.3 F (35.7 C)    Physical Exam Well-developed well-nourished elderly white female in no acute distress.  Height, Weight 180, BMI 25.1  HEENT; nontraumatic normocephalic, EOMI, PER R LA, sclera anicteric. Oropharynx; not examined Neck; supple, no JVD Cardiovascular; regular rate and rhythm with S1-S2, no murmur rub or gallop Pulmonary; Clear bilaterally Abdomen; soft, nontender, nondistended, no palpable mass or hepatosplenomegaly, bowel sounds are active Rectal; not done today Skin; benign exam, no jaundice rash or appreciable lesions Extremities; no clubbing cyanosis or edema skin warm and dry Neuro/Psych; alert and oriented x4, grossly nonfocal mood and affect appropriate       Assessment & Plan:   #73 79 year old female with  5-monthhistory of diarrhea with 2-3 liquid bowel movements per day.  Patient had been having episodes of incontinence due to urgency.  Symptoms improved with use of once daily Imodium, but recurs if stops Imodium. Etiology of persistent diarrhea is not clear, doubt infectious but will rule out.  Consider medication induced however patient says she started Zoloft prior to onset of diarrhea. Rule out new IBD or microscopic colitis, rule out pancreatic insufficiency  #2 adult onset diabetes mellitus 3.  Sinus node dysfunction status post pacemaker 4.  GERD 5.  Hypertension 6.  Prior history of subarachnoid hemorrhage 7.  Remote history of adenomatous colon polyp 1996-overdue for colon cancer screening  Plan; patient will continue Imodium 1 p.o. every morning, she was advised she can take up to 3 daily if needed CBC with differential and c-Met recently done and reviewed, unremarkable.  Will check sed rate, CRP, TSH. Check GI pathogen panel stool for lactoferrin and fecal elastase  Patient will be scheduled for colonoscopy with Dr. SFuller Plan  Procedure was discussed in detail with the patient including indications risks and benefits and she is  agreeable to proceed.  She will have to coordinate with her children regarding transportation etc. prior to scheduling. Patient has been vaccinated for COVID-19. She will call back to schedule  the Colonoscopy.  Monish Haliburton SGenia HaroldPA-C 03/15/2020   Cc: ELauree Chandler NP

## 2020-03-15 NOTE — Patient Instructions (Addendum)
If you are age 79 or older, your body mass index should be between 23-30. Your Body mass index is 25.16 kg/m. If this is out of the aforementioned range listed, please consider follow up with your Primary Care Provider.  If you are age 35 or younger, your body mass index should be between 19-25. Your Body mass index is 25.16 kg/m. If this is out of the aformentioned range listed, please consider follow up with your Primary Care Provider.   Your provider has requested that you go to the basement level for lab work before leaving today. Press "B" on the elevator. The lab is located at the first door on the left as you exit the elevator.  Due to recent changes in healthcare laws, you may see the results of your imaging and laboratory studies on MyChart before your provider has had a chance to review them.  We understand that in some cases there may be results that are confusing or concerning to you. Not all laboratory results come back in the same time frame and the provider may be waiting for multiple results in order to interpret others.  Please give [place] 48 hours in order for your provider to thoroughly review all the results before contacting the office for clarification of your results.   It has been recommended to you by your physician that you have a(n) Colonoscopy completed. Per your request, we did not schedule the procedure(s) today. Please contact our office at [phone] should you decide to have the procedure completed. You will be scheduled for a pre-visit and procedure at that time.  Continue Imodium 1 time daily in the morning.

## 2020-03-16 NOTE — Progress Notes (Signed)
Reviewed and agree with management plan.  Stefan Markarian T. Rella Egelston, MD FACG  Gastroenterology  

## 2020-03-19 ENCOUNTER — Other Ambulatory Visit: Payer: Self-pay | Admitting: *Deleted

## 2020-03-19 DIAGNOSIS — G245 Blepharospasm: Secondary | ICD-10-CM

## 2020-03-19 MED ORDER — CLONAZEPAM 1 MG PO TABS: ORAL_TABLET | ORAL | 0 refills | Status: DC

## 2020-03-19 NOTE — Telephone Encounter (Signed)
[employer]

## 2020-03-23 ENCOUNTER — Telehealth: Payer: Self-pay

## 2020-03-23 NOTE — Telephone Encounter (Signed)
Called the patient on 03/22/20. She answered but states she has a headache. Agreed to a call back later. Called the patient today. No answer. Left a message on her voicemail. I have set up appointments for her to have the colonoscopy if she does want to go forward with this. Also, the appointments are soon. If that does not work well for her and her children, the appointments can be moved. Asked she call me back soon to discuss.

## 2020-03-23 NOTE — Telephone Encounter (Signed)
-----   Message from Alfredia Ferguson, PA-C sent at 03/17/2020 10:31 AM EDT ----- Please let patient know that her recent labs were normal.  When I saw her earlier this week she was to get scheduled for colonoscopy.  She needed to discuss with her children regarding transportation etc. prior to scheduling.  Hopefully she can go ahead and pick a day and get scheduled with Dr. Fuller Plan

## 2020-03-24 ENCOUNTER — Encounter: Payer: Self-pay | Admitting: Gastroenterology

## 2020-03-24 NOTE — Telephone Encounter (Signed)
Pt returned your call and r/s colon to 7/14. She did not have transportation for pv tomorrow.

## 2020-03-24 NOTE — Telephone Encounter (Signed)
Thank you Cathy. I was afraid that would be a little short of notice, but I wanted to make it available for her to decide.

## 2020-03-31 ENCOUNTER — Ambulatory Visit (INDEPENDENT_AMBULATORY_CARE_PROVIDER_SITE_OTHER): Payer: Medicare Other | Admitting: Nurse Practitioner

## 2020-03-31 ENCOUNTER — Encounter: Payer: Self-pay | Admitting: Nurse Practitioner

## 2020-03-31 ENCOUNTER — Other Ambulatory Visit: Payer: Self-pay

## 2020-03-31 DIAGNOSIS — I872 Venous insufficiency (chronic) (peripheral): Secondary | ICD-10-CM

## 2020-03-31 DIAGNOSIS — F324 Major depressive disorder, single episode, in partial remission: Secondary | ICD-10-CM | POA: Diagnosis not present

## 2020-03-31 DIAGNOSIS — E871 Hypo-osmolality and hyponatremia: Secondary | ICD-10-CM | POA: Diagnosis not present

## 2020-03-31 DIAGNOSIS — G4700 Insomnia, unspecified: Secondary | ICD-10-CM | POA: Diagnosis not present

## 2020-03-31 DIAGNOSIS — R197 Diarrhea, unspecified: Secondary | ICD-10-CM

## 2020-03-31 NOTE — Progress Notes (Signed)
This service is provided via telemedicine  No vital signs collected/recorded due to the encounter was a telemedicine visit.   Location of patient (ex: home, work):  Home  Patient consents to a telephone visit:  Yes, see telephone encounter dated with annual consent   Location of the provider (ex: office, home):  [facility], Office   Name of any referring provider: n/a  Names of all persons participating in the telemedicine service and their role in the encounter:  S.Chrae B/CMA, Sherrie Mustache, NP, and Patient   Time spent on call:  8 min with medical assistant       Careteam: Patient Care Team: [name], NP as PCP - General (Geriatric Medicine) Patsey Berthold, NP as Nurse Practitioner (Cardiology) Darleen Crocker, MD as Consulting Physician (Ophthalmology) Thompson Grayer, MD as Consulting Physician (Cardiology) Otelia Sergeant, OD as Referring Physician (Ophthalmology)  PLACE OF SERVICE:  [place] Directive information Does Patient Have a Medical Advance Directive?: No, Would patient like information on creating a medical advance directive?: Yes (MAU/Ambulatory/Procedural Areas - Information given)(Patient has packet from previous visit)  Allergies  Allergen Reactions  . Oxaprozin Rash    Chief Complaint  Patient presents with  . Follow-up    6 week follow-up. Patient c/o ankle swelling, patient has pending appt with foot specialist.   . Headache    Patient c/o headaches every few day     HPI: Patient is a 79 y.o. female for follow up via telephone visit- no transportation or way to do a virtual visit.   Diarrhea- reports diarrhea overall is better, seeing GI and plan for colonoscopy  Headaches/subarachnoid hemorrhage- has not made an appt for CT or with neurosurgery due to her son being sick and in the hospital. Reports he is her transportation and does not want to make appts.   Depression- continues on  Zoloft 50 and Wellbutrin 150, hard due to son being in the hospital and she is alone in her home gives her a lot of time to think.  Reports she is sleeping better with reduction of Wellbutrin.   Ankle swelling to both ankles- worse in the evening. Reports she sits in a chair most day with legs.  Helps when she elevates them on the sofa.  No shortness of breath or chest pain.   Review of Systems:  Review of Systems  Constitutional: Negative for chills, fever and malaise/fatigue.  Respiratory: Negative for cough and shortness of breath.   Cardiovascular: Positive for leg swelling. Negative for chest pain.  Gastrointestinal: Negative for abdominal pain, constipation and diarrhea.  Musculoskeletal: Negative for joint pain and myalgias.  Neurological: Positive for headaches. Negative for dizziness, focal weakness and weakness.  Psychiatric/Behavioral: Positive for depression. The patient is nervous/anxious. The patient does not have insomnia.     Past Medical History:  Diagnosis Date  . Anxiety and depression    Hx of  . Blepharospasm    . Breast cancer ([place])    s/p lumpectomy  . Carotid bruit    Bilateral. Mild to moderate disease on LEFT and mild on the RIGHT in 2012  . COPD (chronic obstructive pulmonary disease) ([place])   . Diabetes mellitus without complication ([place])    AODM  . Fuchs' corneal dystrophy   . GERD (gastroesophageal reflux disease)    . Hyperlipidemia   . Hypertension    . Insomnia    . Intracerebral bleed [facility]) 2003   Remote  intracerebral blled and coiling by Dr. Estanislado Pandy for cerebral aneurysm ans a shunt placed by Dr. Vertell Limber remotely. No seizure disorder and this happened in 2003 with an anterior communicating aneurysm.  Marland Kitchen Neurocardiogenic syncope    a. s/p Biotronik dual chamber pacemaker  . Personal history of radiation therapy   . Sinus node dysfunction (HCC)    . Tobacco abuse    . Type II or unspecified type diabetes mellitus without mention of  complication, not stated as uncontrolled     Past Surgical History:  Procedure Laterality Date  . ANEURYSM COILING  2003   Cerebral aneurysm ;Dr. Luanne Bras  . BREAST LUMPECTOMY Right    For cancer, no recurrence  . CHOLECYSTECTOMY    . PACEMAKER GENERATOR CHANGE N/A 09/25/2014   BTK dual chamber pacemkaer implanted by Dr Rayann Heman  . PACEMAKER INSERTION  07/12/2006   St[employer] Jude Victory XL DR  -  Dr. Tami Ribas  . PARTIAL HYSTERECTOMY    . VENTRICULOPERITONEAL SHUNT  2003   Dr. Erline Levine   Social History:   reports that she has quit smoking. Her smoking use included cigarettes. She started smoking about 14 months ago. She has a 7.50 pack-year smoking history. She has never used smokeless tobacco. She reports that she does not drink alcohol or use drugs.  Family History  Problem Relation Age of Onset  . Cancer Mother   . Heart attack Father   . Heart attack Son        Heart Pump placed   . Diabetes Son   . Hypertension Other     Medications: Patient's Medications  New Prescriptions   No medications on file  Previous Medications   ASPIRIN EC 325 MG TABLET    Take 325 mg by mouth daily.   ATORVASTATIN (LIPITOR) 40 MG TABLET    TAKE 1 TABLET BY MOUTH EVERY DAY FOR CHOLESTEROL   BUPROPION (WELLBUTRIN SR) 150 MG 12 HR TABLET    Take 1 tablet (150 mg total) by mouth daily.   CLONAZEPAM (KLONOPIN) 1 MG TABLET    Take one tablet by mouth three times daily as needed   LOSARTAN (COZAAR) 100 MG TABLET    Take 1 tablet (100 mg total) by mouth daily.   METFORMIN (GLUCOPHAGE) 500 MG TABLET    TAKE 1 TABLET BY MOUTH WITH BREAKFAST AND WITH SUPPER FOR DIABETES   METOPROLOL TARTRATE (LOPRESSOR) 50 MG TABLET    TAKE 1 TABLET BY MOUTH TWICE DAILY   PANTOPRAZOLE (PROTONIX) 40 MG TABLET    Take 1 tablet (40 mg total) by mouth daily.   SERTRALINE (ZOLOFT) 50 MG TABLET    Take 1 tablet (50 mg total) by mouth daily.  Modified Medications   No medications on file  Discontinued Medications   No  medications on file    Physical Exam:  There were no vitals filed for this visit. There is no height or weight on file to calculate BMI. Wt Readings from Last 3 Encounters:  03/15/20 180 lb 6 oz (81.8 kg)  02/18/20 179 lb (81.2 kg)  10/13/19 189 lb (85.7 kg)     Labs reviewed: Basic Metabolic Panel: Recent Labs    09/24/19 1027 02/18/20 1501 02/26/20 1123 03/15/20 1529  NA 140 132* 135  --   K 4.0 5.2 4.6  --   CL 105 99 101  --   CO2 28 26 24   --   GLUCOSE 113* 97 101*  --   BUN 12 17 26*  --  CREATININE 0.74 0.83 0.80  --   CALCIUM 8.9 9.3 9.4  --   TSH  --  2.23  --  1.69   Liver Function Tests: Recent Labs    09/24/19 1027 02/18/20 1501  AST 11 10  ALT 9 10  BILITOT 0.8 0.7  PROT 6.2 6.3   Recent Labs    02/18/20 1501  LIPASE 21  AMYLASE 28   No results for input(s): AMMONIA in the last 8760 hours. CBC: Recent Labs    09/24/19 1027 02/18/20 1501  WBC 7.0 7.4  NEUTROABS 5,852 4,692  HGB 11.6* 11.9  HCT 35.4 37.3  MCV 86.1 86.3  PLT 279 274   Lipid Panel: No results for input(s): CHOL, HDL, LDLCALC, TRIG, CHOLHDL, LDLDIRECT in the last 8760 hours. TSH: Recent Labs    02/18/20 1501 03/15/20 1529  TSH 2.23 1.69   A1C: Lab Results  Component Value Date   HGBA1C 6.2 (H) 02/18/2020     Assessment/Plan 1. Sodium deficiency -improved on recent labs, diarrhea has improved therefore this was likely contributing.   2. Diarrhea, unspecified type Following with GI, colonoscopy scheduled. Overall doing better.  3. Major depressive disorder with single episode, in partial remission (HCC) Stable, has decreased Wellbutrin due to sleep issues which has improved. Overall mood has not changed and she reports some better despite son being sick.  4. Insomnia, unspecified type Improved with reduction on wellbutrin.   5. Edema of both lower extremities due to peripheral venous insufficiency -dicussed elevation or LE when sitting, low sodium diet.  Compression hose during the day.  Next appt: 3 months for routine follow up [place]. Harle Battiest  [facility] [phone]    Virtual Visit via Telephone Note  I connected with pt on 03/31/20 at 11:30 AM EDT by telephone and verified that I am speaking with the correct person using two identifiers.  Location: Patient: home Provider: office   I discussed the limitations, risks, security and privacy concerns of performing an evaluation and management service by telephone and the availability of in person appointments. I also discussed with the patient that there may be a patient responsible charge related to this service. The patient expressed understanding and agreed to proceed.   I discussed the assessment and treatment plan with the patient. The patient was provided an opportunity to ask questions and all were answered. The patient agreed with the plan and demonstrated an understanding of the instructions.   The patient was advised to call back or seek an in-person evaluation if the symptoms worsen or if the condition fails to improve as anticipated.  I provided 21 minutes of non-face-to-face time during this encounter.  Carlos American. Harle Battiest Avs printed and mailed

## 2020-04-06 ENCOUNTER — Encounter: Payer: Medicare Other | Admitting: Gastroenterology

## 2020-04-10 ENCOUNTER — Other Ambulatory Visit: Payer: Self-pay | Admitting: Nurse Practitioner

## 2020-04-14 ENCOUNTER — Other Ambulatory Visit: Payer: Self-pay | Admitting: *Deleted

## 2020-04-14 MED ORDER — ATORVASTATIN CALCIUM 40 MG PO TABS: ORAL_TABLET | ORAL | 1 refills | Status: DC

## 2020-04-14 NOTE — Telephone Encounter (Signed)
[facility]

## 2020-04-16 ENCOUNTER — Other Ambulatory Visit: Payer: Self-pay | Admitting: *Deleted

## 2020-04-16 DIAGNOSIS — G245 Blepharospasm: Secondary | ICD-10-CM

## 2020-04-16 MED ORDER — CLONAZEPAM 1 MG PO TABS: ORAL_TABLET | ORAL | 0 refills | Status: DC

## 2020-04-16 NOTE — Telephone Encounter (Signed)
Received refill Request from Mary Higgins and sent to [place] for approval Janett Billow out of office) Epic LR: 03/19/20

## 2020-05-13 ENCOUNTER — Other Ambulatory Visit: Payer: Self-pay | Admitting: Family

## 2020-05-13 DIAGNOSIS — G245 Blepharospasm: Secondary | ICD-10-CM

## 2020-05-14 ENCOUNTER — Ambulatory Visit: Payer: Medicare Other | Admitting: Podiatry

## 2020-05-14 NOTE — Telephone Encounter (Signed)
RX last filled in Epic by [place] on 04/16/2020  Appointment pending on 06/16/2020 to sign treatment agreement

## 2020-05-20 ENCOUNTER — Ambulatory Visit
Admission: RE | Admit: 2020-05-20 | Discharge: 2020-05-20 | Disposition: A | Discharge status: Home / Self Care | Payer: Medicare Other | Source: Ambulatory Visit | Attending: Nurse Practitioner | Admitting: Nurse Practitioner

## 2020-05-20 ENCOUNTER — Other Ambulatory Visit: Payer: Self-pay

## 2020-05-20 DIAGNOSIS — I609 Nontraumatic subarachnoid hemorrhage, unspecified: Secondary | ICD-10-CM

## 2020-05-20 DIAGNOSIS — I6389 Other cerebral infarction: Secondary | ICD-10-CM | POA: Diagnosis not present

## 2020-05-20 DIAGNOSIS — G319 Degenerative disease of nervous system, unspecified: Secondary | ICD-10-CM | POA: Diagnosis not present

## 2020-05-20 DIAGNOSIS — G44309 Post-traumatic headache, unspecified, not intractable: Secondary | ICD-10-CM | POA: Diagnosis not present

## 2020-05-20 DIAGNOSIS — G44329 Chronic post-traumatic headache, not intractable: Secondary | ICD-10-CM

## 2020-05-20 DIAGNOSIS — I671 Cerebral aneurysm, nonruptured: Secondary | ICD-10-CM | POA: Diagnosis not present

## 2020-05-20 MED ORDER — IOPAMIDOL (ISOVUE-300) INJECTION 61%
75.0000 mL | Freq: Once | INTRAVENOUS | Status: AC | PRN
  Administered 2020-05-20: 75 mL via INTRAVENOUS

## 2020-05-26 ENCOUNTER — Encounter: Payer: Medicare Other | Admitting: Gastroenterology

## 2020-05-27 ENCOUNTER — Other Ambulatory Visit: Payer: Self-pay | Admitting: Nurse Practitioner

## 2020-05-27 DIAGNOSIS — I10 Essential (primary) hypertension: Secondary | ICD-10-CM

## 2020-06-02 LAB — CUP PACEART REMOTE DEVICE CHECK
Battery Remaining Percentage: 65 %
Brady Statistic RA Percent Paced: 32 %
Brady Statistic RV Percent Paced: 0 %
Date Time Interrogation Session: 20210721153902
Implantable Lead Implant Date: 20070830
Implantable Lead Implant Date: 20070830
Implantable Lead Location: 753859
Implantable Lead Location: 753860
Implantable Pulse Generator Implant Date: 20151113
Lead Channel Impedance Value: 390 Ohm
Lead Channel Impedance Value: 702 Ohm
Lead Channel Pacing Threshold Amplitude: 0.6 V
Lead Channel Pacing Threshold Amplitude: 1.8 V
Lead Channel Pacing Threshold Pulse Width: 0.4 ms
Lead Channel Pacing Threshold Pulse Width: 0.4 ms
Lead Channel Sensing Intrinsic Amplitude: 1.8 mV
Lead Channel Sensing Intrinsic Amplitude: 8 mV
Lead Channel Setting Pacing Amplitude: 1.5 V
Lead Channel Setting Pacing Amplitude: 3.6 V
Lead Channel Setting Pacing Pulse Width: 0.4 ms
Pulse Gen Model: 394931
Pulse Gen Serial Number: 68408118

## 2020-06-03 ENCOUNTER — Telehealth: Payer: Self-pay

## 2020-06-03 ENCOUNTER — Ambulatory Visit (INDEPENDENT_AMBULATORY_CARE_PROVIDER_SITE_OTHER): Payer: Medicare Other | Admitting: *Deleted

## 2020-06-03 DIAGNOSIS — R519 Headache, unspecified: Secondary | ICD-10-CM

## 2020-06-03 DIAGNOSIS — R55 Syncope and collapse: Secondary | ICD-10-CM | POA: Diagnosis not present

## 2020-06-03 DIAGNOSIS — I609 Nontraumatic subarachnoid hemorrhage, unspecified: Secondary | ICD-10-CM

## 2020-06-03 NOTE — Telephone Encounter (Signed)
She was also supposed to follow up with neurosurgery due to hx of Subarachnoid hemorrhage and has a shunt.  I have placed referral to neurology for headache but would also recommend her making routine follow up with neurosurgery

## 2020-06-03 NOTE — Telephone Encounter (Signed)
Patient called stating no one has discussed her CT results from 05/20/2020.  I informed patient that per documentation Bridget/CMA spoke with her on Monday about results and provided her the number to follow-up with the neurosurgeon as recommended by [name], NP.  Patient states Shelton Silvas did give her a number but did not give her any results.  I informed patient that CT was stable. Patient verbalized understanding. Patient states she called Dr.Nundkumar's office and was told they have never heard of her.  Patient aware I will call to clarify. I called Dr.Nundkumar's office and patient is an established patient, last seen 1.5 years ago. I spoke with Dr.Nundkumar's assistant and she stated patient called asking for CT results and they told her that they have not ordered a recent CT on her. Per Dr.Nundkumar's assistant patient was never told she was unknown to their office. Dr.Nundkumar's assistant stated if Ct was stable and patient is still having headaches she needs to follow-up with a neurologist not the neurosurgeon for they do not treat headaches there.  Please advise

## 2020-06-03 NOTE — Telephone Encounter (Signed)
Discussed Mary Higgins's response with patient. Patient verbalized understanding. I actually said to patient do you understanding and she said yes.   Patient will call Dr.Nundkumar's office back to schedule follow-up. Patient should expect a call from Neurology about ongoing headaches.

## 2020-06-04 NOTE — Progress Notes (Signed)
Remote pacemaker transmission.   

## 2020-06-13 ENCOUNTER — Other Ambulatory Visit: Payer: Self-pay | Admitting: Nurse Practitioner

## 2020-06-13 DIAGNOSIS — G245 Blepharospasm: Secondary | ICD-10-CM

## 2020-06-14 NOTE — Telephone Encounter (Signed)
Received refill Request from pharmacy Pended Rx and sent to [facility] for approval.

## 2020-06-24 ENCOUNTER — Other Ambulatory Visit: Payer: Self-pay | Admitting: Nurse Practitioner

## 2020-06-24 DIAGNOSIS — E119 Type 2 diabetes mellitus without complications: Secondary | ICD-10-CM

## 2020-06-24 DIAGNOSIS — G245 Blepharospasm: Secondary | ICD-10-CM

## 2020-06-25 NOTE — Telephone Encounter (Signed)
Clonazepam last filled on 06/14/20, treatment agreement on file from 09/2019

## 2020-07-02 ENCOUNTER — Ambulatory Visit: Payer: Medicare Other | Admitting: Nurse Practitioner

## 2020-07-07 ENCOUNTER — Other Ambulatory Visit: Payer: Self-pay

## 2020-07-07 ENCOUNTER — Encounter: Payer: Self-pay | Admitting: Nurse Practitioner

## 2020-07-07 ENCOUNTER — Ambulatory Visit (INDEPENDENT_AMBULATORY_CARE_PROVIDER_SITE_OTHER): Payer: Medicare Other | Admitting: Nurse Practitioner

## 2020-07-07 VITALS — BP 128/72 | HR 83 | Temp 97.5°F | Ht 71.0 in | Wt 171.0 lb

## 2020-07-07 DIAGNOSIS — R197 Diarrhea, unspecified: Secondary | ICD-10-CM | POA: Diagnosis not present

## 2020-07-07 DIAGNOSIS — E1169 Type 2 diabetes mellitus with other specified complication: Secondary | ICD-10-CM

## 2020-07-07 DIAGNOSIS — E871 Hypo-osmolality and hyponatremia: Secondary | ICD-10-CM

## 2020-07-07 DIAGNOSIS — I609 Nontraumatic subarachnoid hemorrhage, unspecified: Secondary | ICD-10-CM | POA: Diagnosis not present

## 2020-07-07 DIAGNOSIS — E782 Mixed hyperlipidemia: Secondary | ICD-10-CM | POA: Diagnosis not present

## 2020-07-07 DIAGNOSIS — I10 Essential (primary) hypertension: Secondary | ICD-10-CM

## 2020-07-07 DIAGNOSIS — G245 Blepharospasm: Secondary | ICD-10-CM

## 2020-07-07 DIAGNOSIS — Z1159 Encounter for screening for other viral diseases: Secondary | ICD-10-CM

## 2020-07-07 DIAGNOSIS — F324 Major depressive disorder, single episode, in partial remission: Secondary | ICD-10-CM | POA: Diagnosis not present

## 2020-07-07 DIAGNOSIS — R519 Headache, unspecified: Secondary | ICD-10-CM

## 2020-07-07 NOTE — Progress Notes (Signed)
Careteam: Patient Care Team: [name], NP as PCP - General (Geriatric Medicine) Patsey Berthold, NP as Nurse Practitioner (Cardiology) Darleen Crocker, MD as Consulting Physician (Ophthalmology) Thompson Grayer, MD as Consulting Physician (Cardiology) Otelia Sergeant, OD as Referring Physician (Ophthalmology)  PLACE OF SERVICE:  [facility]  Advanced Directive information    Allergies  Allergen Reactions  . Oxaprozin Rash    Chief Complaint  Patient presents with  . Medical Management of Chronic Issues    3 month follow-up. Here with Maudie Mercury (daughter)  . Orders    Discuss orders for PT, moderate fall risk   . Immunizations    Flu vaccine not in stock, discuss need for TD  . Best Practice Recommendations    Discuss Hep C recommendation   . Quality Metric Gaps    Discuss need for eye exam, ? if foot exam due today      HPI: Patient is a 79 y.o. female for follow up.   Unable to get transportation for appts. Has not look into scat  Diarrhea- had to cancel colonoscopy, reports diarrhea better. Bowels movements are normal but does have to take an anti-diarrheal every 2-3 days.   Depression-reports mood has been good but anxiety is bad due to her son being sick.   DM- does not check blood sugars, no signs of hypoglycemia Has not been to the eye doctor recently reports "blind as a bat" needs new glasses but has not been to eye doctor in over 2 years.   Hypertension- controlled on lopressor and losartan.   Headaches- unchanged, occasionally will take tylenol  2-3 times a week.   Does not do much activity. Sits around most of the day.    Review of Systems:  Review of Systems  Constitutional: Negative for chills, fever and weight loss.  HENT: Negative for tinnitus.   Respiratory: Negative for cough, sputum production and shortness of breath.   Cardiovascular: Negative for chest pain, palpitations and leg swelling.  Gastrointestinal: Positive for diarrhea.  Negative for abdominal pain, constipation and heartburn.  Genitourinary: Negative for dysuria, frequency and urgency.  Musculoskeletal: Negative for back pain, falls, joint pain and myalgias.  Skin: Negative.   Neurological: Positive for headaches. Negative for dizziness.  Psychiatric/Behavioral: Positive for depression. Negative for memory loss. The patient is nervous/anxious. The patient does not have insomnia.     Past Medical History:  Diagnosis Date  . Anxiety and depression    Hx of  . Blepharospasm    . Breast cancer ([place])    s/p lumpectomy  . Carotid bruit    Bilateral. Mild to moderate disease on LEFT and mild on the RIGHT in 2012  . COPD (chronic obstructive pulmonary disease) ([place])   . Diabetes mellitus without complication ([place])    AODM  . Fuchs' corneal dystrophy   . GERD (gastroesophageal reflux disease)    . Hyperlipidemia   . Hypertension    . Insomnia    . Intracerebral bleed ([place]) 2003   Remote intracerebral blled and coiling by Dr. Estanislado Pandy for cerebral aneurysm ans a shunt placed by Dr. Vertell Limber remotely. No seizure disorder and this happened in 2003 with an anterior communicating aneurysm.  Marland Kitchen Neurocardiogenic syncope    a. s/p Biotronik dual chamber pacemaker  . Personal history of radiation therapy   . Sinus node dysfunction (HCC)    . Tobacco abuse    . Type II or unspecified type diabetes mellitus without mention of complication, not stated  as uncontrolled     Past Surgical History:  Procedure Laterality Date  . ANEURYSM COILING  2003   Cerebral aneurysm ;Dr. Luanne Bras  . BREAST LUMPECTOMY Right    For cancer, no recurrence  . CHOLECYSTECTOMY    . PACEMAKER GENERATOR CHANGE N/A 09/25/2014   BTK dual chamber pacemkaer implanted by Dr Rayann Heman  . PACEMAKER INSERTION  07/12/2006   [employer] Jude Victory XL DR  -  Dr. Tami Ribas  . PARTIAL HYSTERECTOMY    . VENTRICULOPERITONEAL SHUNT  2003   Dr. Erline Levine   Social History:   reports that she has been  smoking cigarettes. She started smoking about 17 months ago. She has a 7.50 pack-year smoking history. She has never used smokeless tobacco. She reports that she does not drink alcohol and does not use drugs.  Family History  Problem Relation Age of Onset  . Cancer Mother   . Heart attack Father   . Heart attack Son        Heart Pump placed   . Diabetes Son   . Hypertension Other     Medications: Patient's Medications  New Prescriptions   No medications on file  Previous Medications   ASPIRIN EC 325 MG TABLET    Take 325 mg by mouth daily.   ATORVASTATIN (LIPITOR) 40 MG TABLET    Take one tablet by mouth daily for cholesterol   BUPROPION (WELLBUTRIN SR) 150 MG 12 HR TABLET    Take 1 tablet (150 mg total) by mouth daily.   CLONAZEPAM (KLONOPIN) 1 MG TABLET    TAKE 1 TABLET BY MOUTH THREE TIMES DAILY AS NEEDED   LOSARTAN (COZAAR) 100 MG TABLET    TAKE 1 TABLET(100 MG) BY MOUTH DAILY   METFORMIN (GLUCOPHAGE) 500 MG TABLET    TAKE 1 TABLET BY MOUTH WITH BREAKFAST AND WITH SUPPER FOR DIABETES   METOPROLOL TARTRATE (LOPRESSOR) 50 MG TABLET    TAKE 1 TABLET BY MOUTH TWICE DAILY   PANTOPRAZOLE (PROTONIX) 40 MG TABLET    Take 1 tablet (40 mg total) by mouth daily.   SERTRALINE (ZOLOFT) 50 MG TABLET    TAKE 1 TABLET(50 MG) BY MOUTH DAILY  Modified Medications   No medications on file  Discontinued Medications   No medications on file    Physical Exam:  Vitals:   07/07/20 1425  BP: 128/72  Pulse: 83  Temp: (!) 97.5 F (36.4 C)  TempSrc: Temporal  SpO2: 96%  Weight: 171 lb (77.6 kg)  Height: 5\' 11"  (1.803 m)   Body mass index is 23.85 kg/m. Wt Readings from Last 3 Encounters:  07/07/20 171 lb (77.6 kg)  03/15/20 180 lb 6 oz (81.8 kg)  02/18/20 179 lb (81.2 kg)    Physical Exam Constitutional:      General: She is not in acute distress.    Appearance: She is well-developed. She is not diaphoretic.  HENT:     Head: Normocephalic and atraumatic.     Mouth/Throat:      Pharynx: No oropharyngeal exudate.  Eyes:     Conjunctiva/sclera: Conjunctivae normal.     Pupils: Pupils are equal, round, and reactive to light.  Cardiovascular:     Rate and Rhythm: Normal rate and regular rhythm.     Pulses:          Dorsalis pedis pulses are 2+ on the right side and 2+ on the left side.       Posterior tibial pulses are 2+ on the  right side and 2+ on the left side.     Heart sounds: Normal heart sounds.  Pulmonary:     Effort: Pulmonary effort is normal.     Breath sounds: Normal breath sounds.  Abdominal:     General: Bowel sounds are normal.     Palpations: Abdomen is soft.  Musculoskeletal:        General: No tenderness.     Cervical back: Normal range of motion and neck supple.  Feet:     Right foot:     Protective Sensation: 3 sites tested. 3 sites sensed.     Skin integrity: Skin integrity normal.     Toenail Condition: Right toenails are abnormally thick and long.     Left foot:     Protective Sensation: 3 sites tested. 3 sites sensed.     Skin integrity: Skin integrity normal.     Toenail Condition: Left toenails are abnormally thick and long.  Skin:    General: Skin is warm and dry.  Neurological:     Mental Status: She is alert and oriented to person, place, and time.     Labs reviewed: Basic Metabolic Panel: Recent Labs    09/24/19 1027 02/18/20 1501 02/26/20 1123 03/15/20 1529  NA 140 132* 135  --   K 4.0 5.2 4.6  --   CL 105 99 101  --   CO2 28 26 24   --   GLUCOSE 113* 97 101*  --   BUN 12 17 26*  --   CREATININE 0.74 0.83 0.80  --   CALCIUM 8.9 9.3 9.4  --   TSH  --  2.23  --  1.69   Liver Function Tests: Recent Labs    09/24/19 1027 02/18/20 1501  AST 11 10  ALT 9 10  BILITOT 0.8 0.7  PROT 6.2 6.3   Recent Labs    02/18/20 1501  LIPASE 21  AMYLASE 28   No results for input(s): AMMONIA in the last 8760 hours. CBC: Recent Labs    09/24/19 1027 02/18/20 1501  WBC 7.0 7.4  NEUTROABS 5,852 4,692  HGB 11.6*  11.9  HCT 35.4 37.3  MCV 86.1 86.3  PLT 279 274   Lipid Panel: No results for input(s): CHOL, HDL, LDLCALC, TRIG, CHOLHDL, LDLDIRECT in the last 8760 hours. TSH: Recent Labs    02/18/20 1501 03/15/20 1529  TSH 2.23 1.69   A1C: Lab Results  Component Value Date   HGBA1C 6.2 (H) 02/18/2020     Assessment/Plan 1. Intractable headache, unspecified chronicity pattern, unspecified headache type -stable, uses tylenol with good relief at this time.  2. Subarachnoid hemorrhage ([place]) -encouraged follow up with neurosurgy s/p shunt   3. Diarrhea, unspecified type -to use benefiber daily with probiotic   4. Major depressive disorder with single episode, in partial remission [facility]) Discussed talking with therapist but she declined, unable to titrate medication due to hyponatremia. She continues on Wellbutrin and zoloft.   5. Blepharospasm - ongoing, uses clonazepam to help with symptom management.  6. Mixed hyperlipidemia -continues on lipitor 40 mg daily  - Lipid Panel - COMPLETE METABOLIC PANEL WITH GFR  7. Type 2 diabetes mellitus with other specified complication, without long-term current use of insulin (HCC) -Encouraged dietary compliance, routine foot care/monitoring and to keep up with diabetic eye exams through ophthalmology  -plans to make appt with podiatrist to cut toe nails.  - Hemoglobin A1c  8. Sodium deficiency Follow up labs today  9. Essential  hypertension -well controlled on losartan - CBC with Differential/Platelet - COMPLETE METABOLIC PANEL WITH GFR  10. Need for hepatitis C screening test - Hepatitis C antibody  Next appt: 4 months.  Carlos American. Bertie, [facility] [phone]

## 2020-07-07 NOTE — Patient Instructions (Signed)
Now that you have gotten the CT done you need to call and make appt with neurosurgery for visit and results  NEUROSURGERY OFFICE Address: [address], [place], [place] [address] Phone: [phone]   RECOMMEND TO GET HIGH DOSE FLU VACCINE  To start probiotic twice daily To add benefiber daily to bulk stools

## 2020-07-08 LAB — HEPATITIS C ANTIBODY
Hepatitis C Ab: NONREACTIVE
SIGNAL TO CUT-OFF: 0.01 (ref <1.00)

## 2020-07-08 LAB — LIPID PANEL
Cholesterol: 85 mg/dL (ref <200)
HDL: 39 mg/dL — ABNORMAL LOW (ref >=50)
LDL Cholesterol (Calc): 30 mg/dL (calc)
Non-HDL Cholesterol (Calc): 46 mg/dL (calc) (ref <130)
Total CHOL/HDL Ratio: 2.2 (calc) (ref <5.0)
Triglycerides: 82 mg/dL (ref <150)

## 2020-07-08 LAB — HEMOGLOBIN A1C
Hgb A1c MFr Bld: 6.2 % of total Hgb — ABNORMAL HIGH (ref <5.7)
Mean Plasma Glucose: 131 (calc)
eAG (mmol/L): 7.3 (calc)

## 2020-07-08 LAB — COMPLETE METABOLIC PANEL WITH GFR
AG Ratio: 2.3 (calc) (ref 1.0–2.5)
ALT: 10 U/L (ref 6–29)
AST: 14 U/L (ref 10–35)
Albumin: 4.1 g/dL (ref 3.6–5.1)
Alkaline phosphatase (APISO): 63 U/L (ref 37–153)
BUN/Creatinine Ratio: 11 (calc) (ref 6–22)
BUN: 12 mg/dL (ref 7–25)
CO2: 22 mmol/L (ref 20–32)
Calcium: 9 mg/dL (ref 8.6–10.4)
Chloride: 103 mmol/L (ref 98–110)
Creat: 1.09 mg/dL — ABNORMAL HIGH (ref 0.60–0.93)
GFR, Est African American: 56 mL/min/{1.73_m2} — ABNORMAL LOW (ref >=60)
GFR, Est Non African American: 48 mL/min/{1.73_m2} — ABNORMAL LOW (ref >=60)
Globulin: 1.8 g/dL (calc) — ABNORMAL LOW (ref 1.9–3.7)
Glucose, Bld: 145 mg/dL — ABNORMAL HIGH (ref 65–139)
Potassium: 4.5 mmol/L (ref 3.5–5.3)
Sodium: 135 mmol/L (ref 135–146)
Total Bilirubin: 0.6 mg/dL (ref 0.2–1.2)
Total Protein: 5.9 g/dL — ABNORMAL LOW (ref 6.1–8.1)

## 2020-07-08 LAB — CBC WITH DIFFERENTIAL/PLATELET
Absolute Monocytes: 638 cells/uL (ref 200–950)
Basophils Absolute: 84 cells/uL (ref 0–200)
Basophils Relative: 1 %
Eosinophils Absolute: 118 cells/uL (ref 15–500)
Eosinophils Relative: 1.4 %
HCT: 34.9 % — ABNORMAL LOW (ref 35.0–45.0)
Hemoglobin: 11.5 g/dL — ABNORMAL LOW (ref 11.7–15.5)
Lymphs Abs: 2058 cells/uL (ref 850–3900)
MCH: 28.6 pg (ref 27.0–33.0)
MCHC: 33 g/dL (ref 32.0–36.0)
MCV: 86.8 fL (ref 80.0–100.0)
MPV: 10.3 fL (ref 7.5–12.5)
Monocytes Relative: 7.6 %
Neutro Abs: 5502 cells/uL (ref 1500–7800)
Neutrophils Relative %: 65.5 %
Platelets: 249 10*3/uL (ref 140–400)
RBC: 4.02 10*6/uL (ref 3.80–5.10)
RDW: 14.2 % (ref 11.0–15.0)
Total Lymphocyte: 24.5 %
WBC: 8.4 10*3/uL (ref 3.8–10.8)

## 2020-07-13 ENCOUNTER — Other Ambulatory Visit: Payer: Self-pay | Admitting: Nurse Practitioner

## 2020-07-13 DIAGNOSIS — G245 Blepharospasm: Secondary | ICD-10-CM

## 2020-07-13 NOTE — Telephone Encounter (Signed)
Treatment agreement on file from 07/07/2020.  RX last filled in epic on 06/14/2020

## 2020-08-10 ENCOUNTER — Other Ambulatory Visit: Payer: Self-pay | Admitting: Nurse Practitioner

## 2020-08-16 ENCOUNTER — Other Ambulatory Visit: Payer: Self-pay | Admitting: Nurse Practitioner

## 2020-08-16 DIAGNOSIS — G245 Blepharospasm: Secondary | ICD-10-CM

## 2020-08-16 NOTE — Telephone Encounter (Signed)
Refill request received from pharmacy for clonazepam 1 mg tablet . Non-opioid agreement current. Medication pended and sent to Sherrie Mustache, NP  for approval

## 2020-09-02 ENCOUNTER — Ambulatory Visit (INDEPENDENT_AMBULATORY_CARE_PROVIDER_SITE_OTHER): Payer: Medicare Other

## 2020-09-02 DIAGNOSIS — R55 Syncope and collapse: Secondary | ICD-10-CM | POA: Diagnosis not present

## 2020-09-02 LAB — CUP PACEART REMOTE DEVICE CHECK
Battery Remaining Percentage: 65 %
Brady Statistic RA Percent Paced: 32 %
Brady Statistic RV Percent Paced: 0 %
Date Time Interrogation Session: 20211021064438
Implantable Lead Implant Date: 20070830
Implantable Lead Implant Date: 20070830
Implantable Lead Location: 753859
Implantable Lead Location: 753860
Implantable Pulse Generator Implant Date: 20151113
Lead Channel Impedance Value: 332 Ohm
Lead Channel Impedance Value: 663 Ohm
Lead Channel Pacing Threshold Amplitude: 0.8 V
Lead Channel Pacing Threshold Amplitude: 1.7 V
Lead Channel Pacing Threshold Pulse Width: 0.4 ms
Lead Channel Pacing Threshold Pulse Width: 0.4 ms
Lead Channel Sensing Intrinsic Amplitude: 3.1 mV
Lead Channel Sensing Intrinsic Amplitude: 9.2 mV
Lead Channel Setting Pacing Amplitude: 1.5 V
Lead Channel Setting Pacing Amplitude: 3.6 V
Lead Channel Setting Pacing Pulse Width: 0.4 ms
Pulse Gen Model: 394931
Pulse Gen Serial Number: 68408118

## 2020-09-05 ENCOUNTER — Other Ambulatory Visit: Payer: Self-pay | Admitting: Nurse Practitioner

## 2020-09-06 NOTE — Telephone Encounter (Signed)
Medication list and requested medication differ.  Spoke with patient, patient states she is taking protonix twice daily versus once daily.  Patient aware I will send message to [name], NP to advise if ok to change current medication list to reflect how she is currently taking.

## 2020-09-06 NOTE — Telephone Encounter (Signed)
Left detailed message on voicemail informing patient of Jessica's response. Patient advised to return call if needed.

## 2020-09-06 NOTE — Telephone Encounter (Signed)
She should only be taking medication daily.

## 2020-09-08 NOTE — Progress Notes (Signed)
Remote pacemaker transmission.   

## 2020-09-16 ENCOUNTER — Other Ambulatory Visit: Payer: Self-pay | Admitting: Nurse Practitioner

## 2020-09-16 DIAGNOSIS — G245 Blepharospasm: Secondary | ICD-10-CM

## 2020-09-16 NOTE — Telephone Encounter (Signed)
Pharmacy requested refill Epic LR: 08/16/2020 Contract on [employer] [facility] and sent to [employer] for approval. Janett Billow out of office)

## 2020-10-22 ENCOUNTER — Other Ambulatory Visit: Payer: Self-pay | Admitting: Nurse Practitioner

## 2020-10-22 ENCOUNTER — Other Ambulatory Visit: Payer: Self-pay | Admitting: Family

## 2020-10-22 DIAGNOSIS — I10 Essential (primary) hypertension: Secondary | ICD-10-CM

## 2020-10-22 DIAGNOSIS — G245 Blepharospasm: Secondary | ICD-10-CM

## 2020-10-22 NOTE — Telephone Encounter (Signed)
Patient is requesting refill on medication "Clonazepam 1mg ". Patient last refill was 11/4/202 with 90 tablets to be taken 3 times daily as needed. Patient is due for refills. Medication pend and sent to [name], NP . Please Advise.

## 2020-11-10 ENCOUNTER — Ambulatory Visit: Payer: Medicare Other | Admitting: Nurse Practitioner

## 2020-11-10 ENCOUNTER — Other Ambulatory Visit: Payer: Self-pay | Admitting: Nurse Practitioner

## 2020-11-10 DIAGNOSIS — E119 Type 2 diabetes mellitus without complications: Secondary | ICD-10-CM

## 2020-11-17 ENCOUNTER — Other Ambulatory Visit: Payer: Self-pay | Admitting: Nurse Practitioner

## 2020-11-17 ENCOUNTER — Ambulatory Visit: Payer: Medicare Other | Admitting: Nurse Practitioner

## 2020-11-17 DIAGNOSIS — G245 Blepharospasm: Secondary | ICD-10-CM

## 2020-11-17 NOTE — Telephone Encounter (Signed)
Will refill closer to time due, will need to schedule follow up appt.

## 2020-11-17 NOTE — Telephone Encounter (Signed)
RX last filled on 10/22/20  Treatment agreement on file from 10/13/2019, patient had appt today and canceled due to car broke down and no transportation.  No pending appt

## 2020-11-18 NOTE — Telephone Encounter (Signed)
Routed back to provider to hold until due

## 2020-11-23 ENCOUNTER — Other Ambulatory Visit: Payer: Self-pay

## 2020-11-23 DIAGNOSIS — E119 Type 2 diabetes mellitus without complications: Secondary | ICD-10-CM

## 2020-11-23 MED ORDER — METFORMIN HCL 500 MG PO TABS: ORAL_TABLET | ORAL | 0 refills | Status: DC

## 2020-12-02 ENCOUNTER — Ambulatory Visit (INDEPENDENT_AMBULATORY_CARE_PROVIDER_SITE_OTHER): Payer: Medicare Other

## 2020-12-02 DIAGNOSIS — I495 Sick sinus syndrome: Secondary | ICD-10-CM | POA: Diagnosis not present

## 2020-12-02 LAB — CUP PACEART REMOTE DEVICE CHECK
Battery Remaining Percentage: 60 %
Brady Statistic RA Percent Paced: 32 %
Brady Statistic RV Percent Paced: 0 %
Date Time Interrogation Session: 20220119065809
Implantable Lead Implant Date: 20070830
Implantable Lead Implant Date: 20070830
Implantable Lead Location: 753859
Implantable Lead Location: 753860
Implantable Pulse Generator Implant Date: 20151113
Lead Channel Impedance Value: 332 Ohm
Lead Channel Impedance Value: 644 Ohm
Lead Channel Pacing Threshold Amplitude: 0.7 V
Lead Channel Pacing Threshold Pulse Width: 0.4 ms
Lead Channel Sensing Intrinsic Amplitude: 1.5 mV
Lead Channel Sensing Intrinsic Amplitude: 7.2 mV
Lead Channel Setting Pacing Amplitude: 1.5 V
Lead Channel Setting Pacing Amplitude: 3.6 V
Lead Channel Setting Pacing Pulse Width: 0.4 ms
Pulse Gen Model: 394931
Pulse Gen Serial Number: 68408118

## 2020-12-03 ENCOUNTER — Ambulatory Visit: Payer: Self-pay | Admitting: Nurse Practitioner

## 2020-12-08 ENCOUNTER — Other Ambulatory Visit: Payer: Self-pay

## 2020-12-08 ENCOUNTER — Encounter: Payer: Self-pay | Admitting: Nurse Practitioner

## 2020-12-08 ENCOUNTER — Ambulatory Visit (INDEPENDENT_AMBULATORY_CARE_PROVIDER_SITE_OTHER): Payer: Medicare Other | Admitting: Nurse Practitioner

## 2020-12-08 VITALS — BP 128/76 | HR 77 | Temp 95.5°F | Wt 166.4 lb

## 2020-12-08 DIAGNOSIS — G245 Blepharospasm: Secondary | ICD-10-CM

## 2020-12-08 DIAGNOSIS — E871 Hypo-osmolality and hyponatremia: Secondary | ICD-10-CM

## 2020-12-08 DIAGNOSIS — Z23 Encounter for immunization: Secondary | ICD-10-CM | POA: Diagnosis not present

## 2020-12-08 DIAGNOSIS — E782 Mixed hyperlipidemia: Secondary | ICD-10-CM

## 2020-12-08 DIAGNOSIS — I10 Essential (primary) hypertension: Secondary | ICD-10-CM | POA: Diagnosis not present

## 2020-12-08 DIAGNOSIS — R519 Headache, unspecified: Secondary | ICD-10-CM | POA: Diagnosis not present

## 2020-12-08 DIAGNOSIS — E1169 Type 2 diabetes mellitus with other specified complication: Secondary | ICD-10-CM | POA: Diagnosis not present

## 2020-12-08 DIAGNOSIS — F324 Major depressive disorder, single episode, in partial remission: Secondary | ICD-10-CM

## 2020-12-08 DIAGNOSIS — R197 Diarrhea, unspecified: Secondary | ICD-10-CM

## 2020-12-08 MED ORDER — BLOOD GLUCOSE MONITOR KIT
PACK | 0 refills | Status: DC
Start: 2020-12-08 — End: 2021-02-16

## 2020-12-08 MED ORDER — ONETOUCH ULTRASOFT LANCETS MISC: 12 refills | Status: DC

## 2020-12-08 MED ORDER — GLUCOSE BLOOD VI STRP: ORAL_STRIP | 12 refills | Status: DC

## 2020-12-08 MED ORDER — ALCOHOL WIPES 70 % PADS: 1.0000 | MEDICATED_PAD | Freq: Every day | 0 refills | Status: DC | PRN

## 2020-12-08 MED ORDER — BLOOD GLUCOSE MONITOR KIT: PACK | 0 refills | Status: DC

## 2020-12-08 NOTE — Patient Instructions (Addendum)
-   referral for ophthalmology placed- make sure to go to this appt to see if there is anything else they can offer for the blepharospasm. Also needing evaluation due to diabetes and routine eye exam.   -make appt with psychiatrist   It is your responsibility to call neurosurgery office and make appt NEUROSURGERY OFFICE Address: [address], [place], [place] [address] Phone: [phone]  STOP metformin  Check blood sugar once daily before breakfast- record Goal blood sugar should be less than 150 fasting.

## 2020-12-08 NOTE — Progress Notes (Signed)
Careteam: Patient Care Team: [name], NP as PCP - General (Geriatric Medicine) Patsey Berthold, NP as Nurse Practitioner (Cardiology) Darleen Crocker, MD as Consulting Physician (Ophthalmology) Thompson Grayer, MD as Consulting Physician (Cardiology) Otelia Sergeant, OD as Referring Physician (Ophthalmology)  PLACE OF SERVICE:  [facility]  Advanced Directive information    Allergies  Allergen Reactions  . Oxaprozin Rash    Chief Complaint  Patient presents with  . Medical Management of Chronic Issues    Routine follow-up visit. Flu vaccine today. Discuss need for foot exam, eye exam, and TDap. Patient c/o diarrhea off/on x several months and ongoing headaches. Here with son [name].      HPI: Patient is a 80 y.o. female for routine follow up.  Diarrhea- ongoing. Reports normal bowel movements and then episodes of diarrhea for days. Taking anti-diarrheal at home but no help.  Depression- feels better today. Has episodes of both depression and anxiety. Recent loss of her brother from [place], son is very sick with multiple health problems, and she has recently lost contact with her daughter. She reports she is a Research officer, trade union constantly thinking about things. Has been recommended to see counselor and therapist in the past but not followed through with this.   DM-does not check sugars at home, has not had an eye exam in many years. Has occasional blurry vision, and frequent dry eyes. Denies neurology  Hypertension-controlled on lopressor and losartan. BP today is 128/78. Does not monitor them at home.  Headaches-ongoing, has headache daily but it relieved with tylenol.  Sleep- Son complains of pt sleeping majority of the day. Pt says it is a combination of her blepharospasm and depressed mood.   Review of Systems:  Review of Systems  Constitutional: Negative for chills, fever and weight loss.  HENT: Negative for tinnitus.   Eyes: Positive for blurred vision (occasionally)  and photophobia.  Respiratory: Negative for cough, sputum production and shortness of breath.   Cardiovascular: Negative for chest pain, palpitations and leg swelling.  Gastrointestinal: Positive for diarrhea. Negative for abdominal pain, constipation, nausea and vomiting.  Genitourinary: Negative for dysuria, frequency and urgency.  Musculoskeletal: Negative for back pain, falls and myalgias.  Skin: Negative.   Neurological: Negative for dizziness, weakness and headaches.  Psychiatric/Behavioral: Positive for depression. Negative for memory loss. The patient is nervous/anxious. The patient does not have insomnia.     Past Medical History:  Diagnosis Date  . Anxiety and depression    Hx of  . Blepharospasm    . Breast cancer ([place])    s/p lumpectomy  . Carotid bruit    Bilateral. Mild to moderate disease on LEFT and mild on the RIGHT in 2012  . COPD (chronic obstructive pulmonary disease) ([place])   . Diabetes mellitus without complication ([place])    AODM  . Fuchs' corneal dystrophy   . GERD (gastroesophageal reflux disease)    . Hyperlipidemia   . Hypertension    . Insomnia    . Intracerebral bleed ([place]) 2003   Remote intracerebral blled and coiling by Dr. Estanislado Pandy for cerebral aneurysm ans a shunt placed by Dr. Vertell Limber remotely. No seizure disorder and this happened in 2003 with an anterior communicating aneurysm.  Marland Kitchen Neurocardiogenic syncope    a. s/p Biotronik dual chamber pacemaker  . Personal history of radiation therapy   . Sinus node dysfunction (HCC)    . Tobacco abuse    . Type II or unspecified type diabetes mellitus without mention of  complication, not stated as uncontrolled     Past Surgical History:  Procedure Laterality Date  . ANEURYSM COILING  2003   Cerebral aneurysm ;Dr. Luanne Bras  . BREAST LUMPECTOMY Right    For cancer, no recurrence  . CHOLECYSTECTOMY    . PACEMAKER GENERATOR CHANGE N/A 09/25/2014   BTK dual chamber pacemkaer implanted by Dr Rayann Heman   . PACEMAKER INSERTION  07/12/2006   [employer] Victory XL DR  -  Dr. Tami Ribas  . PARTIAL HYSTERECTOMY    . VENTRICULOPERITONEAL SHUNT  2003   Dr. Erline Levine   Social History:   reports that she has been smoking cigarettes. She started smoking about 22 months ago. She has smoked for the past 15.00 years. She has never used smokeless tobacco. She reports that she does not drink alcohol and does not use drugs.  Family History  Problem Relation Age of Onset  . Cancer Mother   . Heart attack Father   . Heart attack Son        Heart Pump placed   . Diabetes Son   . Hypertension Other     Medications: Patient's Medications  New Prescriptions   No medications on file  Previous Medications   ASPIRIN EC 325 MG TABLET    Take 325 mg by mouth daily.   ATORVASTATIN (LIPITOR) 40 MG TABLET    Take one tablet by mouth daily for cholesterol   BUPROPION (WELLBUTRIN SR) 150 MG 12 HR TABLET    Take 1 tablet (150 mg total) by mouth daily.   CLONAZEPAM (KLONOPIN) 1 MG TABLET    TAKE 1 TABLET BY MOUTH THREE TIMES DAILY AS NEEDED   LOSARTAN (COZAAR) 100 MG TABLET    TAKE 1 TABLET(100 MG) BY MOUTH DAILY   METFORMIN (GLUCOPHAGE) 500 MG TABLET    TAKE 1 TABLET BY MOUTH WITH BREAKFAST AND WITH SUPPER FOR DIABETES   METOPROLOL TARTRATE (LOPRESSOR) 50 MG TABLET    TAKE 1 TABLET BY MOUTH TWICE DAILY   PANTOPRAZOLE (PROTONIX) 40 MG TABLET    Take 1 tablet (40 mg total) by mouth daily.   SERTRALINE (ZOLOFT) 50 MG TABLET    TAKE 1 TABLET(50 MG) BY MOUTH DAILY  Modified Medications   No medications on file  Discontinued Medications   No medications on file    Physical Exam:  Vitals:   12/08/20 1122  BP: 128/76  Pulse: 77  Temp: (!) 95.5 F (35.3 C)  TempSrc: Temporal  SpO2: 98%  Weight: 166 lb 6.4 oz (75.5 kg)   Body mass index is 23.21 kg/m. Wt Readings from Last 3 Encounters:  12/08/20 166 lb 6.4 oz (75.5 kg)  07/07/20 171 lb (77.6 kg)  03/15/20 180 lb 6 oz (81.8 kg)    Physical  Exam Constitutional:      General: She is not in acute distress.    Appearance: Normal appearance. She is not diaphoretic.  HENT:     Head: Normocephalic and atraumatic.     Mouth/Throat:     Pharynx: No oropharyngeal exudate.  Eyes:     Extraocular Movements:     Right eye: Normal extraocular motion.     Pupils: Pupils are equal, round, and reactive to light.  Cardiovascular:     Rate and Rhythm: Normal rate and regular rhythm.     Pulses:          Dorsalis pedis pulses are 2+ on the right side and 2+ on the left side.  Posterior tibial pulses are 2+ on the right side and 2+ on the left side.     Heart sounds: Normal heart sounds.  Pulmonary:     Effort: Pulmonary effort is normal.     Breath sounds: Normal breath sounds.  Abdominal:     General: Bowel sounds are normal.     Palpations: Abdomen is soft.  Musculoskeletal:        General: No swelling or tenderness. Normal range of motion.  Feet:     Right foot:     Protective Sensation: 10 sites tested. 7 sites sensed.     Skin integrity: Dry skin present.     Toenail Condition: Right toenails are abnormally thick and long.     Left foot:     Protective Sensation: 10 sites tested. 7 sites sensed.     Skin integrity: Dry skin present.     Toenail Condition: Left toenails are abnormally thick and long.  Skin:    General: Skin is warm and dry.  Neurological:     Mental Status: She is alert and oriented to person, place, and time.  Psychiatric:        Mood and Affect: Mood is depressed.     Labs reviewed: Basic Metabolic Panel: Recent Labs    02/18/20 1501 02/26/20 1123 03/15/20 1529 07/07/20 1455  NA 132* 135  --  135  K 5.2 4.6  --  4.5  CL 99 101  --  103  CO2 26 24  --  22  GLUCOSE 97 101*  --  145*  BUN 17 26*  --  12  CREATININE 0.83 0.80  --  1.09*  CALCIUM 9.3 9.4  --  9.0  TSH 2.23  --  1.69  --    Liver Function Tests: Recent Labs    02/18/20 1501 07/07/20 1455  AST 10 14  ALT 10 10   BILITOT 0.7 0.6  PROT 6.3 5.9*   Recent Labs    02/18/20 1501  LIPASE 21  AMYLASE 28   No results for input(s): AMMONIA in the last 8760 hours. CBC: Recent Labs    02/18/20 1501 07/07/20 1455  WBC 7.4 8.4  NEUTROABS 4,692 5,502  HGB 11.9 11.5*  HCT 37.3 34.9*  MCV 86.3 86.8  PLT 274 249   Lipid Panel: Recent Labs    07/07/20 1455  CHOL 85  HDL 39*  LDLCALC 30  TRIG 82  CHOLHDL 2.2   TSH: Recent Labs    02/18/20 1501 03/15/20 1529  TSH 2.23 1.69   A1C: Lab Results  Component Value Date   HGBA1C 6.2 (H) 07/07/2020     Assessment/Plan 1. Need for influenza vaccination Instructed to get vaccine at local pharmacy- no high dose flu in office today.   2. Diarrhea, unspecified type Continues having episodes of diarrhea that comes and goes. Discontinued Metformin to see if this is contributing to diarrhea.  Continue benefiber daily. Will follow up in 1 month to see if this has improved.  3. Blepharospasm Ongoing, uses clonazepam to help with symptom management. Referral to ophthalmology to get better opinion about further management.  - Ambulatory referral to Ophthalmology  4. Intractable headache, unspecified chronicity pattern, unspecified headache type -Stable.still having headaches daily, relieved with tylenol. ? If related to blepharospasm.   5. Major depressive disorder with single episode, in partial remission ([place]) Continues having episodes of depression and anxiety. Continues taking Wellbutrin and zoloft. Recommendations made for psychiatrist and counselor at this time.  Limited titration of zoloft due to low sodium   6. Type 2 diabetes mellitus with other specified complication, without long-term current use of insulin (HCC) -will stop metformin due to diarrhea to see if this is cause. If A1c is at goal will have her monitor at home and continue diet modifications. Consider adding medication for elevated A1c Ordered supplies to check sugars at home.  Continue with healthy diet and eating three meals daily. Educated on routine foot care./monitoring and eye exams. Encouraged to see podiatrist to cut toe nails. Emphasized the urgent need to see ophthalmology. - Ambulatory referral to Ophthalmology - Hemoglobin A1c  7. Mixed hyperlipidemia Continues on lipitor daily  8. Essential hypertension Controlled with losartan - CMP with eGFR([facility]) - CBC with Differential/Platelet   Next appt: 4 weeks Beni Turrell K. Paytin Ramakrishnan, [facility] [phone]   I personally was present during the history, physical exam and medical decision-making activities of this service and have verified that the service and findings are accurately documented in the student's note

## 2020-12-09 LAB — CBC WITH DIFFERENTIAL/PLATELET
Absolute Monocytes: 454 cells/uL (ref 200–950)
Basophils Absolute: 83 cells/uL (ref 0–200)
Basophils Relative: 1.4 %
Eosinophils Absolute: 118 cells/uL (ref 15–500)
Eosinophils Relative: 2 %
HCT: 36.6 % (ref 35.0–45.0)
Hemoglobin: 12 g/dL (ref 11.7–15.5)
Lymphs Abs: 1794 cells/uL (ref 850–3900)
MCH: 28.2 pg (ref 27.0–33.0)
MCHC: 32.8 g/dL (ref 32.0–36.0)
MCV: 86.1 fL (ref 80.0–100.0)
MPV: 10.3 fL (ref 7.5–12.5)
Monocytes Relative: 7.7 %
Neutro Abs: 3452 cells/uL (ref 1500–7800)
Neutrophils Relative %: 58.5 %
Platelets: 290 10*3/uL (ref 140–400)
RBC: 4.25 10*6/uL (ref 3.80–5.10)
RDW: 13.6 % (ref 11.0–15.0)
Total Lymphocyte: 30.4 %
WBC: 5.9 10*3/uL (ref 3.8–10.8)

## 2020-12-09 LAB — HEMOGLOBIN A1C
Hgb A1c MFr Bld: 6 % of total Hgb — ABNORMAL HIGH (ref <5.7)
Mean Plasma Glucose: 126 mg/dL
eAG (mmol/L): 7 mmol/L

## 2020-12-09 LAB — COMPLETE METABOLIC PANEL WITH GFR
AG Ratio: 2.1 (calc) (ref 1.0–2.5)
ALT: 6 U/L (ref 6–29)
AST: 8 U/L — ABNORMAL LOW (ref 10–35)
Albumin: 4.2 g/dL (ref 3.6–5.1)
Alkaline phosphatase (APISO): 66 U/L (ref 37–153)
BUN: 16 mg/dL (ref 7–25)
CO2: 29 mmol/L (ref 20–32)
Calcium: 9.3 mg/dL (ref 8.6–10.4)
Chloride: 101 mmol/L (ref 98–110)
Creat: 0.84 mg/dL (ref 0.60–0.93)
GFR, Est African American: 77 mL/min/{1.73_m2} (ref >=60)
GFR, Est Non African American: 66 mL/min/{1.73_m2} (ref >=60)
Globulin: 2 g/dL (calc) (ref 1.9–3.7)
Glucose, Bld: 91 mg/dL (ref 65–99)
Potassium: 4.5 mmol/L (ref 3.5–5.3)
Sodium: 136 mmol/L (ref 135–146)
Total Bilirubin: 0.6 mg/dL (ref 0.2–1.2)
Total Protein: 6.2 g/dL (ref 6.1–8.1)

## 2020-12-14 ENCOUNTER — Other Ambulatory Visit: Payer: Self-pay | Admitting: Nurse Practitioner

## 2020-12-14 DIAGNOSIS — F324 Major depressive disorder, single episode, in partial remission: Secondary | ICD-10-CM

## 2020-12-14 NOTE — Telephone Encounter (Signed)
Patient has request a refill on medication "Bupropion" and "Sertraline". Medication Bupropion was last refilled 03/12/2020 with 90 tablets and 1 refill. Sertraline was last refilled 08/10/2020 with 30 tablets and 3 refills. I tried to send medication into pharmacy but warning came up. Medication pend and sent to PCP [name], NP . Please Advise.

## 2020-12-15 NOTE — Progress Notes (Signed)
Remote pacemaker transmission.   

## 2020-12-17 ENCOUNTER — Other Ambulatory Visit: Payer: Self-pay | Admitting: Nurse Practitioner

## 2020-12-17 DIAGNOSIS — G245 Blepharospasm: Secondary | ICD-10-CM

## 2021-01-19 ENCOUNTER — Other Ambulatory Visit: Payer: Self-pay | Admitting: Nurse Practitioner

## 2021-01-19 DIAGNOSIS — G245 Blepharospasm: Secondary | ICD-10-CM

## 2021-01-19 NOTE — Telephone Encounter (Signed)
Received refill Request from pharmacy Epic LR: 12/17/2020 Contract on [employer] [facility] and sent to [facility] for approval.

## 2021-02-10 ENCOUNTER — Other Ambulatory Visit: Payer: Self-pay | Admitting: Nurse Practitioner

## 2021-02-10 DIAGNOSIS — E119 Type 2 diabetes mellitus without complications: Secondary | ICD-10-CM

## 2021-02-16 ENCOUNTER — Other Ambulatory Visit: Payer: Self-pay | Admitting: Nurse Practitioner

## 2021-02-16 DIAGNOSIS — F324 Major depressive disorder, single episode, in partial remission: Secondary | ICD-10-CM

## 2021-02-16 DIAGNOSIS — E1169 Type 2 diabetes mellitus with other specified complication: Secondary | ICD-10-CM

## 2021-02-16 DIAGNOSIS — G245 Blepharospasm: Secondary | ICD-10-CM

## 2021-02-16 NOTE — Telephone Encounter (Signed)
Treatment agreement on file from 07/07/2020 for clonazepam   RX last filled 01/19/2021

## 2021-03-03 ENCOUNTER — Ambulatory Visit (INDEPENDENT_AMBULATORY_CARE_PROVIDER_SITE_OTHER): Payer: Medicare Other

## 2021-03-03 DIAGNOSIS — I495 Sick sinus syndrome: Secondary | ICD-10-CM | POA: Diagnosis not present

## 2021-03-03 LAB — CUP PACEART REMOTE DEVICE CHECK
Date Time Interrogation Session: 20220421071429
Implantable Lead Implant Date: 20070830
Implantable Lead Implant Date: 20070830
Implantable Lead Location: 753859
Implantable Lead Location: 753860
Implantable Pulse Generator Implant Date: 20151113
Pulse Gen Model: 394931
Pulse Gen Serial Number: 68408118

## 2021-03-07 ENCOUNTER — Encounter: Payer: Self-pay | Admitting: Family

## 2021-03-07 ENCOUNTER — Ambulatory Visit (INDEPENDENT_AMBULATORY_CARE_PROVIDER_SITE_OTHER): Payer: Medicare Other | Admitting: Family

## 2021-03-07 ENCOUNTER — Other Ambulatory Visit: Payer: Self-pay

## 2021-03-07 VITALS — BP 130/88 | HR 82 | Temp 97.1°F | Resp 16 | Ht 71.0 in | Wt 161.2 lb

## 2021-03-07 DIAGNOSIS — N6320 Unspecified lump in the left breast, unspecified quadrant: Secondary | ICD-10-CM | POA: Diagnosis not present

## 2021-03-07 DIAGNOSIS — Z1231 Encounter for screening mammogram for malignant neoplasm of breast: Secondary | ICD-10-CM

## 2021-03-07 DIAGNOSIS — Z23 Encounter for immunization: Secondary | ICD-10-CM | POA: Diagnosis not present

## 2021-03-07 MED ORDER — TETANUS-DIPHTH-ACELL PERTUSSIS 5-2.5-18.5 LF-MCG/0.5 IM SUSP: 0.5000 mL | Freq: Once | INTRAMUSCULAR | 0 refills | Status: AC

## 2021-03-07 NOTE — Progress Notes (Signed)
Provider: Issa Luster FNP-C  [name], NP  Patient Care Team: [name], NP as PCP - General (Geriatric Medicine) Patsey Berthold, NP as Nurse Practitioner (Cardiology) Darleen Crocker, MD as Consulting Physician (Ophthalmology) Thompson Grayer, MD as Consulting Physician (Cardiology) Otelia Sergeant, OD as Referring Physician (Ophthalmology)  Extended Emergency Contact Information Primary Emergency Contact: Stanley,Joseph Address: [place] Johnnette Litter of [place] Phone: [phone] Relation: Son Secondary Emergency Contact: Brantley Fling [place] of [place] Phone: [phone] Relation: Daughter  Code Status:  Full Code  Goals of care: Advanced Directive information Advanced Directives 03/07/2021  Does Patient Have a Medical Advance Directive? No  Does patient want to make changes to medical advance directive? -  Would patient like information on creating a medical advance directive? No - Patient declined     Chief Complaint  Patient presents with  . Acute Visit    Patient complains of lump in left breast.    HPI:  Pt is a 80 y.o. female seen today for an acute visit for evaluation of left breast lump x 1-2 weeks.Areas sore to touch with a small nodule.Left breast large than the right.No nipple inversion or discharge noted.Has had no redness or swelling.   Has hx of breast cancer on the left breast.Not on any hormonal therapy. No fever or chills reported.    Past Medical History:  Diagnosis Date  . Anxiety and depression    Hx of  . Blepharospasm    . Breast cancer ([place])    s/p lumpectomy  . Carotid bruit    Bilateral. Mild to moderate disease on LEFT and mild on the RIGHT in 2012  . COPD (chronic obstructive pulmonary disease) ([place])   . Diabetes mellitus without complication ([place])    AODM  . Fuchs' corneal dystrophy   . GERD (gastroesophageal reflux disease)    . Hyperlipidemia   . Hypertension     . Insomnia    . Intracerebral bleed ([place]) 2003   Remote intracerebral blled and coiling by Dr. Estanislado Pandy for cerebral aneurysm ans a shunt placed by Dr. Vertell Limber remotely. No seizure disorder and this happened in 2003 with an anterior communicating aneurysm.  Marland Kitchen Neurocardiogenic syncope    a. s/p Biotronik dual chamber pacemaker  . Personal history of radiation therapy   . Sinus node dysfunction ([facility])    . Tobacco abuse    . Type II or unspecified type diabetes mellitus without mention of complication, not stated as uncontrolled     Past Surgical History:  Procedure Laterality Date  . ANEURYSM COILING  2003   Cerebral aneurysm ;Dr. Luanne Bras  . BREAST LUMPECTOMY Right    For cancer, no recurrence  . CHOLECYSTECTOMY    . PACEMAKER GENERATOR CHANGE N/A 09/25/2014   BTK dual chamber pacemkaer implanted by Dr Rayann Heman  . PACEMAKER INSERTION  07/12/2006   [employer] Victory XL DR  -  Dr. Tami Ribas  . PARTIAL HYSTERECTOMY    . VENTRICULOPERITONEAL SHUNT  2003   Dr. Erline Levine    Allergies  Allergen Reactions  . Oxaprozin Rash    Outpatient Encounter Medications as of 03/07/2021  Medication Sig  . Alcohol Swabs (ALCOHOL WIPES) 70 % PADS 1 each by Does not apply route daily as needed.  Marland Kitchen aspirin EC 325 MG tablet Take 325 mg by mouth daily.  Marland Kitchen atorvastatin (LIPITOR) 40 MG tablet Take one tablet by mouth  daily for cholesterol  . Blood Glucose Monitoring Suppl (ONE TOUCH ULTRA 2) w/Device KIT USE UP TO FOUR TIMES DAILY AS DIRECTED  . buPROPion (WELLBUTRIN SR) 150 MG 12 hr tablet TAKE 1 TABLET(150 MG) BY MOUTH DAILY  . clonazePAM (KLONOPIN) 1 MG tablet TAKE 1 TABLET BY MOUTH THREE TIMES DAILY AS NEEDED  . glucose blood test strip Use as instructed  . Lancets (ONETOUCH ULTRASOFT) lancets Use as instructed  . losartan (COZAAR) 100 MG tablet TAKE 1 TABLET(100 MG) BY MOUTH DAILY  . metFORMIN (GLUCOPHAGE) 500 MG tablet Take 500 mg by mouth as needed. TAKE 1 TABLET BY MOUTH WITH BREAKFAST  AND WITH SUPPER FOR DIABETES  . metoprolol tartrate (LOPRESSOR) 50 MG tablet TAKE 1 TABLET BY MOUTH TWICE DAILY  . pantoprazole (PROTONIX) 40 MG tablet TAKE 1 TABLET(40 MG) BY MOUTH DAILY  . sertraline (ZOLOFT) 50 MG tablet TAKE 1 TABLET(50 MG) BY MOUTH DAILY  . [DISCONTINUED] metFORMIN (GLUCOPHAGE) 500 MG tablet TAKE 1 TABLET BY MOUTH WITH BREAKFAST AND WITH SUPPER FOR DIABETES   No facility-administered encounter medications on file as of 03/07/2021.    Review of Systems  Constitutional: Negative for appetite change, chills, fatigue, fever and unexpected weight change.  Respiratory: Negative for cough, chest tightness, shortness of breath and wheezing.   Cardiovascular: Negative for chest pain, palpitations and leg swelling.  Musculoskeletal: Negative for arthralgias, back pain, gait problem, joint swelling, myalgias, neck pain and neck stiffness.  Skin: Negative for color change, pallor, rash and wound.       Left breast sore and nodule per HPI   Neurological: Negative for dizziness, syncope, speech difficulty, weakness, light-headedness, numbness and headaches.  Hematological: Does not bruise/bleed easily.    Immunization History  Administered Date(s) Administered  . Influenza, High Dose Seasonal PF 07/11/2017, 10/01/2018, 09/16/2019  . Influenza,inj,Quad PF,6+ Mos 08/18/2015, 12/13/2016  . PFIZER(Purple Top)SARS-COV-2 Vaccination 01/20/2020, 02/20/2020, 11/10/2020  . Pneumococcal Conjugate-13 08/18/2015  . Pneumococcal Polysaccharide-23 07/11/2017  . Zoster 03/16/2015  . Zoster Recombinat (Shingrix) 08/21/2017, 02/05/2018   Pertinent  Health Maintenance Due  Topic Date Due  . OPHTHALMOLOGY EXAM  11/22/2019  . HEMOGLOBIN A1C  06/07/2021  . INFLUENZA VACCINE  06/13/2021  . FOOT EXAM  12/08/2021  . DEXA SCAN  Completed  . PNA vac Low Risk Adult  Completed   Fall Risk  03/07/2021 07/07/2020 02/18/2020 09/24/2019 06/11/2019  Falls in the past year? 0 $Remov'1 1 1 1  'dDENUJ$ Number falls in past  yr: 0 $Rem'1 1 1 1  'noNH$ Comment - - - - -  Injury with Fall? 0 0 0 1 0  Comment - - - knot on head -  Risk for fall due to : - Impaired balance/gait;History of fall(s) - History of fall(s) -   Functional Status Survey:    Vitals:   03/07/21 1040  BP: 130/88  Pulse: 82  Resp: 16  Temp: (!) 97.1 F (36.2 C)  SpO2: 98%  Weight: 161 lb 3.2 oz (73.1 kg)  Height: $Remove'5\' 11"'SswdGmG$  (1.803 m)   Body mass index is 22.48 kg/m. Physical Exam Vitals reviewed.  Constitutional:      General: She is not in acute distress.    Appearance: Normal appearance. She is normal weight. She is not ill-appearing or diaphoretic.  HENT:     Head: Normocephalic.  Neck:     Vascular: No carotid bruit.  Cardiovascular:     Rate and Rhythm: Normal rate and regular rhythm.     Pulses: Normal pulses.  Heart sounds: Normal heart sounds. No murmur heard. No friction rub. No gallop.   Pulmonary:     Effort: Pulmonary effort is normal. No respiratory distress.     Breath sounds: Normal breath sounds. No wheezing, rhonchi or rales.  Chest:     Chest wall: No tenderness.    Abdominal:     General: Bowel sounds are normal. There is no distension.     Palpations: Abdomen is soft. There is no mass.     Tenderness: There is no abdominal tenderness. There is no right CVA tenderness, left CVA tenderness, guarding or rebound.  Musculoskeletal:        General: No swelling or tenderness. Normal range of motion.     Cervical back: Normal range of motion. No rigidity or tenderness.     Right lower leg: No edema.     Left lower leg: No edema.  Lymphadenopathy:     Cervical: No cervical adenopathy.  Skin:    General: Skin is warm and dry.     Coloration: Skin is not pale.     Findings: No bruising, erythema, lesion or rash.  Neurological:     Mental Status: She is alert and oriented to person, place, and time.     Cranial Nerves: No cranial nerve deficit.     Gait: Gait normal.  Psychiatric:        Mood and Affect: Mood  normal.        Speech: Speech normal.        Behavior: Behavior normal.        Thought Content: Thought content normal.        Judgment: Judgment normal.     Labs reviewed: Recent Labs    07/07/20 1455 12/08/20 1225  NA 135 136  K 4.5 4.5  CL 103 101  CO2 22 29  GLUCOSE 145* 91  BUN 12 16  CREATININE 1.09* 0.84  CALCIUM 9.0 9.3   Recent Labs    07/07/20 1455 12/08/20 1225  AST 14 8*  ALT 10 6  BILITOT 0.6 0.6  PROT 5.9* 6.2   Recent Labs    07/07/20 1455 12/08/20 1225  WBC 8.4 5.9  NEUTROABS 5,502 3,452  HGB 11.5* 12.0  HCT 34.9* 36.6  MCV 86.8 86.1  PLT 249 290   Lab Results  Component Value Date   TSH 1.69 03/15/2020   Lab Results  Component Value Date   HGBA1C 6.0 (H) 12/08/2020   Lab Results  Component Value Date   CHOL 85 07/07/2020   HDL 39 (L) 07/07/2020   LDLCALC 30 07/07/2020   TRIG 82 07/07/2020   CHOLHDL 2.2 07/07/2020    Significant Diagnostic Results in last 30 days:  CUP PACEART REMOTE DEVICE CHECK  Result Date: 03/03/2021 Scheduled remote reviewed. Normal device function.  R. Powers, CVRS Next remote 91 days.   Assessment/Plan  1. Left breast lump Left upper Quadrant 3 O'clock position slightly tender with small nodule.LLQ 4 O'clock position old scar noted.will order er screening mammogram.made aware [facility] will call for appointment.  - MM Digital Diagnostic Unilat L; Future  2. Encounter for screening mammogram for malignant neoplasm of breast left breast nodule as above.  - MM Digital Screening; Future  3. Need for tetanus, diphtheria, and acellular pertussis (Tdap) vaccine Advised to get Tdap vaccine at the pharmacy. Script send to pharmacy today - Tdap ([place]) 5-2.5-18.5 LF-MCG/0.5 injection; Inject 0.5 mLs into the muscle once for 1 dose.  Dispense:  0.5 mL; Refill: 0  Family/ staff Communication: Reviewed plan of care with patient  Labs/tests ordered:  - MM Digital Screening; Future - MM  Digital Diagnostic Unilat L; Future  Next Appointment: As needed if symptoms worsen or fail to improve    Sandrea Hughs, NP

## 2021-03-07 NOTE — Patient Instructions (Signed)
Mammogram ordered to[facility] will call you for appointment

## 2021-03-09 ENCOUNTER — Other Ambulatory Visit: Payer: Self-pay | Admitting: Family

## 2021-03-09 DIAGNOSIS — N6320 Unspecified lump in the left breast, unspecified quadrant: Secondary | ICD-10-CM

## 2021-03-21 NOTE — Progress Notes (Signed)
Remote pacemaker transmission.   

## 2021-04-18 ENCOUNTER — Other Ambulatory Visit: Payer: Self-pay | Admitting: Nurse Practitioner

## 2021-04-18 DIAGNOSIS — G245 Blepharospasm: Secondary | ICD-10-CM

## 2021-04-25 ENCOUNTER — Ambulatory Visit
Admission: RE | Admit: 2021-04-25 | Discharge: 2021-04-25 | Disposition: A | Discharge status: Home / Self Care | Payer: Medicare Other | Source: Ambulatory Visit | Attending: Family | Admitting: Family

## 2021-04-25 ENCOUNTER — Other Ambulatory Visit: Payer: Self-pay

## 2021-04-25 DIAGNOSIS — N6320 Unspecified lump in the left breast, unspecified quadrant: Secondary | ICD-10-CM

## 2021-04-25 DIAGNOSIS — R922 Inconclusive mammogram: Secondary | ICD-10-CM | POA: Diagnosis not present

## 2021-04-25 DIAGNOSIS — Z853 Personal history of malignant neoplasm of breast: Secondary | ICD-10-CM | POA: Diagnosis not present

## 2021-04-25 DIAGNOSIS — N6321 Unspecified lump in the left breast, upper outer quadrant: Secondary | ICD-10-CM | POA: Diagnosis not present

## 2021-05-15 ENCOUNTER — Other Ambulatory Visit: Payer: Self-pay | Admitting: Adult Health

## 2021-05-15 DIAGNOSIS — G245 Blepharospasm: Secondary | ICD-10-CM

## 2021-05-17 ENCOUNTER — Other Ambulatory Visit: Payer: Self-pay

## 2021-05-17 NOTE — Telephone Encounter (Signed)
Incoming call received from patients son requesting a refill on clonazepam. After opening encounter I realized a request has already been received and routed to [name], NP

## 2021-05-17 NOTE — Telephone Encounter (Signed)
Patient has request refill on medication "Clonazepam 1mg ". Medication last refill was 04/18/2021. Medication is due for refill. Medication pend and sent to PCP [name], NP . Please Advise.

## 2021-05-25 ENCOUNTER — Other Ambulatory Visit: Payer: Self-pay | Admitting: Nurse Practitioner

## 2021-05-25 DIAGNOSIS — F324 Major depressive disorder, single episode, in partial remission: Secondary | ICD-10-CM

## 2021-06-01 LAB — CUP PACEART REMOTE DEVICE CHECK
Date Time Interrogation Session: 20220719090521
Implantable Lead Implant Date: 20070830
Implantable Lead Implant Date: 20070830
Implantable Lead Location: 753859
Implantable Lead Location: 753860
Implantable Pulse Generator Implant Date: 20151113
Pulse Gen Model: 394931
Pulse Gen Serial Number: 68408118

## 2021-06-02 ENCOUNTER — Ambulatory Visit (INDEPENDENT_AMBULATORY_CARE_PROVIDER_SITE_OTHER): Payer: Medicare Other

## 2021-06-02 DIAGNOSIS — I495 Sick sinus syndrome: Secondary | ICD-10-CM

## 2021-06-16 ENCOUNTER — Other Ambulatory Visit: Payer: Self-pay | Admitting: Nurse Practitioner

## 2021-06-16 DIAGNOSIS — G245 Blepharospasm: Secondary | ICD-10-CM

## 2021-06-16 NOTE — Telephone Encounter (Signed)
Patient has request refill on medication "Clonazepam". Patient last refill on medication was 05/17/2021. Patient is due for refill. Medication pend and sent to PCP Mary Oats Carlos American, NP for approval. Please Advise.

## 2021-06-24 NOTE — Progress Notes (Signed)
Remote pacemaker transmission.   

## 2021-07-19 ENCOUNTER — Other Ambulatory Visit: Payer: Self-pay

## 2021-07-19 ENCOUNTER — Emergency Department (HOSPITAL_COMMUNITY)
Admission: EM | Admit: 2021-07-19 | Discharge: 2021-07-19 | Disposition: A | Discharge status: Home / Self Care | Payer: Medicare Other | Attending: Emergency Medicine | Admitting: Emergency Medicine

## 2021-07-19 ENCOUNTER — Emergency Department (HOSPITAL_COMMUNITY): Payer: Medicare Other

## 2021-07-19 ENCOUNTER — Other Ambulatory Visit: Payer: Self-pay | Admitting: Nurse Practitioner

## 2021-07-19 DIAGNOSIS — Z7982 Long term (current) use of aspirin: Secondary | ICD-10-CM | POA: Diagnosis not present

## 2021-07-19 DIAGNOSIS — R519 Headache, unspecified: Secondary | ICD-10-CM

## 2021-07-19 DIAGNOSIS — Z950 Presence of cardiac pacemaker: Secondary | ICD-10-CM | POA: Diagnosis not present

## 2021-07-19 DIAGNOSIS — I158 Other secondary hypertension: Secondary | ICD-10-CM

## 2021-07-19 DIAGNOSIS — E119 Type 2 diabetes mellitus without complications: Secondary | ICD-10-CM | POA: Insufficient documentation

## 2021-07-19 DIAGNOSIS — I10 Essential (primary) hypertension: Secondary | ICD-10-CM | POA: Diagnosis not present

## 2021-07-19 DIAGNOSIS — Z982 Presence of cerebrospinal fluid drainage device: Secondary | ICD-10-CM | POA: Diagnosis not present

## 2021-07-19 DIAGNOSIS — Z7984 Long term (current) use of oral hypoglycemic drugs: Secondary | ICD-10-CM | POA: Insufficient documentation

## 2021-07-19 DIAGNOSIS — Z853 Personal history of malignant neoplasm of breast: Secondary | ICD-10-CM | POA: Insufficient documentation

## 2021-07-19 DIAGNOSIS — Z79899 Other long term (current) drug therapy: Secondary | ICD-10-CM | POA: Insufficient documentation

## 2021-07-19 DIAGNOSIS — G245 Blepharospasm: Secondary | ICD-10-CM

## 2021-07-19 DIAGNOSIS — R0602 Shortness of breath: Secondary | ICD-10-CM | POA: Insufficient documentation

## 2021-07-19 DIAGNOSIS — J449 Chronic obstructive pulmonary disease, unspecified: Secondary | ICD-10-CM | POA: Diagnosis not present

## 2021-07-19 DIAGNOSIS — I700 Atherosclerosis of aorta: Secondary | ICD-10-CM | POA: Diagnosis not present

## 2021-07-19 DIAGNOSIS — F17210 Nicotine dependence, cigarettes, uncomplicated: Secondary | ICD-10-CM | POA: Insufficient documentation

## 2021-07-19 DIAGNOSIS — Z9049 Acquired absence of other specified parts of digestive tract: Secondary | ICD-10-CM | POA: Diagnosis not present

## 2021-07-19 LAB — HEPATIC FUNCTION PANEL
ALT: 9 U/L (ref 0–44)
AST: 13 U/L — ABNORMAL LOW (ref 15–41)
Albumin: 4 g/dL (ref 3.5–5.0)
Alkaline Phosphatase: 54 U/L (ref 38–126)
Bilirubin, Direct: 0.1 mg/dL (ref 0.0–0.2)
Indirect Bilirubin: 0.9 mg/dL (ref 0.3–0.9)
Total Bilirubin: 1 mg/dL (ref 0.3–1.2)
Total Protein: 6.8 g/dL (ref 6.5–8.1)

## 2021-07-19 LAB — BASIC METABOLIC PANEL
Anion gap: 11 (ref 5–15)
BUN: 14 mg/dL (ref 8–23)
CO2: 24 mmol/L (ref 22–32)
Calcium: 9.3 mg/dL (ref 8.9–10.3)
Chloride: 102 mmol/L (ref 98–111)
Creatinine, Ser: 0.75 mg/dL (ref 0.44–1.00)
GFR, Estimated: >60 mL/min (ref >60)
Glucose, Bld: 139 mg/dL — ABNORMAL HIGH (ref 70–99)
Potassium: 3.2 mmol/L — ABNORMAL LOW (ref 3.5–5.1)
Sodium: 137 mmol/L (ref 135–145)

## 2021-07-19 LAB — CBC
HCT: 39.5 % (ref 36.0–46.0)
Hemoglobin: 12.9 g/dL (ref 12.0–15.0)
MCH: 29.6 pg (ref 26.0–34.0)
MCHC: 32.7 g/dL (ref 30.0–36.0)
MCV: 90.6 fL (ref 80.0–100.0)
Platelets: 295 10*3/uL (ref 150–400)
RBC: 4.36 MIL/uL (ref 3.87–5.11)
RDW: 15 % (ref 11.5–15.5)
WBC: 7.3 10*3/uL (ref 4.0–10.5)
nRBC: 0 % (ref 0.0–0.2)

## 2021-07-19 LAB — TROPONIN I (HIGH SENSITIVITY): Troponin I (High Sensitivity): 8 ng/L (ref <18)

## 2021-07-19 LAB — BRAIN NATRIURETIC PEPTIDE: B Natriuretic Peptide: 115.4 pg/mL — ABNORMAL HIGH (ref 0.0–100.0)

## 2021-07-19 MED ORDER — LOSARTAN POTASSIUM 50 MG PO TABS: 50.0000 mg | ORAL_TABLET | Freq: Every day | ORAL | 0 refills | Status: DC

## 2021-07-19 MED ORDER — LOSARTAN POTASSIUM 50 MG PO TABS
50.0000 mg | ORAL_TABLET | Freq: Once | ORAL | Status: AC
  Administered 2021-07-19: 50 mg via ORAL
  Filled 2021-07-19: qty 1

## 2021-07-19 NOTE — Discharge Instructions (Addendum)
Please follow-up closely with your primary care provider to determine whether you need to stay on the blood pressure medicine and determine the appropriate dosing.

## 2021-07-19 NOTE — ED Provider Notes (Signed)
[facility] EMERGENCY DEPARTMENT Provider Note  CSN: [id number] Arrival date & time: 07/19/21 2130  Chief Complaint(s) Hypertension and Headache  HPI Mary A Spanbauer is a 80 y.o. female with a past medical history listed below including prior cerebral aneurysm bleed requiring coiling and VP shunt and 2003 who presents to the emergency department with elevated blood pressures with systolics in the 865H.  Patient is accompanied by her daughter who is currently living with the patient.  The patient has been complaining of several weeks of intermittent headaches ranging from moderate to severe.  Alleviated by Tylenol. Currently headache No exacerbating factors. Patient also reports feeling unwell. At these times but patient's daughter checked her blood pressure and noted that they were elevated.  Over the past 2 weeks she is checked her blood pressure for 5 times and each time her blood pressures were elevated with systolics in the 846N. She had not been complaining of any chest pain.  She did report mild shortness of breath. Patient also has chronic bilateral lower extremity edema for the past year.  No prior history of heart failure.  Patient does have a history of sick sinus syndrome and has a pacemaker.  Her PCP or cardiologist has not been contacted regarding the patient's blood pressure. Patient is not currently on any blood pressure medication. Regardless she is noncompliant with her home medications stating that she takes them whenever she remembers to take them. Per family patient has had 1 to 2 years of declining memory issues   Headache  Past Medical History Past Medical History:  Diagnosis Date   Anxiety and depression    Hx of   Blepharospasm     Breast cancer ([place])    s/p lumpectomy   Carotid bruit    Bilateral. Mild to moderate disease on LEFT and mild on the RIGHT in 2012   COPD (chronic obstructive pulmonary disease) ([place])    Diabetes mellitus  without complication (HCC)    AODM   Fuchs' corneal dystrophy    GERD (gastroesophageal reflux disease)     Hyperlipidemia    Hypertension     Insomnia     Intracerebral bleed ([place]) 2003   Remote intracerebral blled and coiling by Dr. Estanislado Pandy for cerebral aneurysm ans a shunt placed by Dr. Vertell Limber remotely. No seizure disorder and this happened in 2003 with an anterior communicating aneurysm.   Neurocardiogenic syncope    a. s/p Biotronik dual chamber pacemaker   Personal history of radiation therapy    Sinus node dysfunction (HCC)     Tobacco abuse     Type II or unspecified type diabetes mellitus without mention of complication, not stated as uncontrolled     Patient Active Problem List   Diagnosis Date Noted   Hyponatremia 02/07/2017   Diarrhea 12/13/2016   Actinic keratosis 12/13/2016   Falls 12/13/2016   Cervicalgia 08/18/2015   Onychomycosis 01/20/2015   Hyperlipidemia 01/20/2015   Syncope and collapse 09/10/2014   Insomnia 03/18/2014   Depression due to head injury 03/18/2014   Flank pain 03/18/2014   Blepharospasm 03/18/2014   GERD (gastroesophageal reflux disease) 03/18/2014   Hypertension 03/18/2014   DM type 2 (diabetes mellitus, type 2) ([place]) 03/18/2014   Breast mass 03/18/2014   Abnormality of gait 03/18/2014   History of fall 03/18/2014   Other malaise and fatigue 03/18/2014   Major depressive disorder with single episode 03/18/2014   Sinus node dysfunction ([place]) 12/21/2013   Tobacco abuse 12/21/2013   Edema  12/21/2013   Neurocardiogenic syncope    Subarachnoid hemorrhage ([place]) 06/01/2002   Home Medication(s) Prior to Admission medications   Medication Sig Start Date End Date Taking? Authorizing Provider  Artificial Tear Ointment (DRY EYES OP) Place 1 drop into both eyes daily as needed (dry eyes).   Yes [provider]  aspirin EC 325 MG tablet Take 325 mg by mouth daily.   Yes [provider]  atorvastatin (LIPITOR) 40 MG tablet Take  one tablet by mouth daily for cholesterol Patient taking differently: Take 40 mg by mouth daily. 04/14/20  Yes [name], NP  buPROPion (WELLBUTRIN SR) 150 MG 12 hr tablet TAKE 1 TABLET(150 MG) BY MOUTH DAILY Patient taking differently: Take 150 mg by mouth daily. 12/14/20  Yes [name], NP  clonazePAM (KLONOPIN) 1 MG tablet TAKE 1 TABLET BY MOUTH THREE TIMES DAILY AS NEEDED Patient taking differently: Take 1 mg by mouth 3 (three) times daily as needed for anxiety. 06/16/21  Yes [name], NP  losartan (COZAAR) 100 MG tablet TAKE 1 TABLET(100 MG) BY MOUTH DAILY Patient taking differently: Take 100 mg by mouth daily. 05/27/20  Yes [name], NP  losartan (COZAAR) 50 MG tablet Take 1 tablet (50 mg total) by mouth daily for 14 days. Hold for blood pressures <120 07/19/21 08/02/21 Yes Shontell Prosser, Grayce Sessions, MD  metFORMIN (GLUCOPHAGE) 500 MG tablet TAKE 1 TABLET BY MOUTH WITH BREAKFAST AND WITH SUPPER FOR DIABETES Patient taking differently: Take 500 mg by mouth daily with supper. 05/25/21  Yes [name], NP  metoprolol tartrate (LOPRESSOR) 50 MG tablet TAKE 1 TABLET BY MOUTH TWICE DAILY Patient taking differently: Take 50 mg by mouth 2 (two) times daily. 04/23/17  Yes Allred, Jeneen Rinks, MD  pantoprazole (PROTONIX) 40 MG tablet TAKE 1 TABLET(40 MG) BY MOUTH DAILY Patient taking differently: Take 40 mg by mouth daily. 01/19/21  Yes [name], NP  sertraline (ZOLOFT) 50 MG tablet TAKE 1 TABLET(50 MG) BY MOUTH DAILY Patient taking differently: Take 50 mg by mouth daily. 12/14/20  Yes [name], NP  Alcohol Swabs (ALCOHOL WIPES) 70 % PADS 1 each by Does not apply route daily as needed. Patient not taking: Reported on 07/19/2021 12/08/20   [name], NP  Blood Glucose Monitoring Suppl (ONE TOUCH ULTRA 2) w/Device KIT USE UP TO FOUR TIMES DAILY AS DIRECTED Patient not taking: Reported on 07/19/2021 02/16/21   [name], NP  glucose blood test strip  Use as instructed Patient not taking: Reported on 07/19/2021 12/08/20   [name], NP  Lancets [facility] ULTRASOFT) lancets Use as instructed Patient not taking: Reported on 07/19/2021 12/08/20   [name], NP                                                                                                                                    Past Surgical History  Past Surgical History:  Procedure Laterality Date   ANEURYSM COILING  2003   Cerebral aneurysm ;Dr. Luanne Bras   BREAST LUMPECTOMY Right    For cancer, no recurrence   CHOLECYSTECTOMY     PACEMAKER GENERATOR CHANGE N/A 09/25/2014   BTK dual chamber pacemkaer implanted by Dr Rayann Heman   PACEMAKER INSERTION  07/12/2006   [employer] Victory XL DR  -  Dr. Tami Ribas   PARTIAL HYSTERECTOMY     VENTRICULOPERITONEAL SHUNT  2003   Dr. Erline Levine   Family History Family History  Problem Relation Age of Onset   Cancer Mother    Heart attack Father    Heart attack Son        Heart Pump placed    Diabetes Son    Hypertension Other     Social History Social History   Tobacco Use   Smoking status: Some Days    Years: 15.00    Types: Cigarettes    Start date: 01/12/2019   Smokeless tobacco: Never   Tobacco comments:    Smokes 1 cig daily   Vaping Use   Vaping Use: Never used  Substance Use Topics   Alcohol use: No    Alcohol/week: 0.0 standard drinks   Drug use: No   Allergies Oxaprozin  Review of Systems Review of Systems  Neurological:  Positive for headaches.  All other systems are reviewed and are negative for acute change except as noted in the HPI  Physical Exam Vital Signs  I have reviewed the triage vital signs BP (!) 178/88   Pulse 74   Temp 98.3 F (36.8 C) (Oral)   Resp 15   Ht $R'5\' 11"'NG$  (1.803 m)   Wt 70.3 kg   LMP  (LMP Unknown)   SpO2 98%   BMI 21.62 kg/m   Physical Exam Vitals reviewed.  Constitutional:      General: She is not in acute distress.    Appearance: She is  well-developed. She is not diaphoretic.  HENT:     Head: Normocephalic and atraumatic.     Nose: Nose normal.  Eyes:     General: No scleral icterus.       Right eye: No discharge.        Left eye: No discharge.     Conjunctiva/sclera: Conjunctivae normal.     Pupils: Pupils are equal, round, and reactive to light.  Cardiovascular:     Rate and Rhythm: Normal rate and regular rhythm.     Heart sounds: No murmur heard.   No friction rub. No gallop.  Pulmonary:     Effort: Pulmonary effort is normal. No respiratory distress.     Breath sounds: Normal breath sounds. No stridor. No rales.  Abdominal:     General: There is no distension.     Palpations: Abdomen is soft.     Tenderness: There is no abdominal tenderness.  Musculoskeletal:        General: No tenderness.     Cervical back: Normal range of motion and neck supple.  Skin:    General: Skin is warm and dry.     Findings: No erythema or rash.  Neurological:     Mental Status: She is alert.     Comments: Mental Status:  Alert and oriented to person, place Attention and concentration normal.  Speech clear.  Recent memory is intact  Cranial Nerves:  II Visual Fields: Intact to confrontation. Visual fields intact. III, IV, VI: Pupils equal and reactive  to light and near. Full eye movement without nystagmus  V Facial Sensation: Normal. No weakness of masticatory muscles  VII: No facial weakness or asymmetry  VIII Auditory Acuity: Grossly normal  IX/X: The uvula is midline; the palate elevates symmetrically  XI: Normal sternocleidomastoid and trapezius strength  XII: The tongue is midline. No atrophy or fasciculations.   Motor System: Muscle Strength: 5/5 and symmetric in the upper and lower extremities. No pronation or drift.  Muscle Tone: Tone and muscle bulk are normal in the upper and lower extremities.  Reflexes:  No Clonus Coordination: No tremor.  Sensation: Intact to light touch Gait: deferred     ED Results  and Treatments Labs (all labs ordered are listed, but only abnormal results are displayed) Labs Reviewed  BASIC METABOLIC PANEL - Abnormal; Notable for the following components:      Result Value   Potassium 3.2 (*)    Glucose, Bld 139 (*)    All other components within normal limits  BRAIN NATRIURETIC PEPTIDE - Abnormal; Notable for the following components:   B Natriuretic Peptide 115.4 (*)    All other components within normal limits  HEPATIC FUNCTION PANEL - Abnormal; Notable for the following components:   AST 13 (*)    All other components within normal limits  CBC  TROPONIN I (HIGH SENSITIVITY)  TROPONIN I (HIGH SENSITIVITY)                                                                                                                         EKG  EKG Interpretation  Date/Time:  Tuesday July 19 2021 08:08:48 EDT Ventricular Rate:  68 PR Interval:  177 QRS Duration: 89 QT Interval:  417 QTC Calculation: 444 R Axis:   44 Text Interpretation: Sinus rhythm Probable anteroseptal infarct, recent No acute changes Confirmed by Addison Lank [phone]) on 07/19/2021 8:11:40 AM       Radiology DG Skull 1-3 Views  Result Date: 07/19/2021 CLINICAL DATA:  80 year old female with headaches, VP shunt. EXAM: SKULL - 1-3 VIEW COMPARISON:  Skull series 05/27/2007 and earlier. Head CT earlier today. FINDINGS: Left vertex approach ventriculostomy with partially radiolucent shunt reservoir over the left convexity. Shunt tubing continues inferiorly and is now calcified in the neck. No discontinuity identified. The shunt setting is visible on image #1. No acute osseous abnormality identified. IMPRESSION: Partially calcified CSF shunt with no discontinuity identified. Electronically Signed   By: Genevie Ann M.D.   On: 07/19/2021 07:15   DG Chest 1 View  Result Date: 07/19/2021 CLINICAL DATA:  80 year old female with headaches, VP shunt. EXAM: CHEST  1 VIEW COMPARISON:  Skull and neck radiographs  today. Chest radiographs 06/01/2018 and earlier. FINDINGS: Portable AP supine view at 0704 hours. Stable appearance of the shunt tubing passing through the left chest from the neck and into the upper abdomen. Lung volumes and mediastinal contours remain normal. Allowing for portable technique the lungs are clear. Visualized tracheal air column is within  normal limits. Stable right chest cardiac pacemaker, bilateral axillary surgical clips. IMPRESSION: 1. Stable, satisfactory appearance of the shunt tubing from the left neck to the upper abdomen. 2.  No acute cardiopulmonary abnormality. Electronically Signed   By: Genevie Ann M.D.   On: 07/19/2021 07:16   DG Cervical Spine 1 View  Result Date: 07/19/2021 CLINICAL DATA:  80 year old female with headaches, VP shunt. EXAM: DG CERVICAL SPINE - 1 VIEW COMPARISON:  Head CT 0601 hours today. Skull series 05/27/2007 and earlier. FINDINGS: Left side partially calcified shunt tubing extends from the neck into the chest. No discontinuity identified. Superimposed bilateral cervical carotid calcified atherosclerosis. Right chest cardiac pacemaker partially visible. Negative visible upper lungs and mediastinal contour. IMPRESSION: 1. Left side shunt tubing is partially calcified but no discontinuity is identified as it extends into the chest. 2. Cervical carotid calcified atherosclerosis. Electronically Signed   By: Genevie Ann M.D.   On: 07/19/2021 07:14   DG Abd 1 View  Result Date: 07/19/2021 CLINICAL DATA:  80 year old female with headaches, VP shunt. EXAM: ABDOMEN - 1 VIEW COMPARISON:  Portable chest radiograph today. Abdominal radiographs 05/27/2007. FINDINGS: AP supine view at 0707 hours. Calcification of the shunt tubing from the lower chest into the upper abdomen since 2008. The tubing continues into the right hemipelvis as before. The tip is not definitely identified on this image in the pelvis, but no discontinuity is identified. Stable cholecystectomy clips. Bowel-gas  pattern is within normal limits. Negative lung bases. No acute osseous abnormality identified. Aortoiliac calcified atherosclerosis. IMPRESSION: 1. Shunt tubing has partially calcified since 2008, but has a stable configuration terminating in the pelvis. No discontinuity is identified. 2.  Aortic Atherosclerosis (ICD10-I70.0). Electronically Signed   By: Genevie Ann M.D.   On: 07/19/2021 07:19   CT HEAD WO CONTRAST (5MM)  Result Date: 07/19/2021 CLINICAL DATA:  Headache. EXAM: CT HEAD WITHOUT CONTRAST TECHNIQUE: Contiguous axial images were obtained from the base of the skull through the vertex without intravenous contrast. COMPARISON:  CT head dated May 20, 2020. FINDINGS: Brain: No evidence of acute infarction, hemorrhage, hydrocephalus, or extra-axial collection. Unchanged left frontal approach ventriculostomy catheter. Ventricle size is stable. Stable atrophy and bifrontal periventricular white matter gliosis. Old left inferior frontal lobe infarct again noted. Unchanged 12 mm calcified meningioma at the left parafalcine vertex. Vascular: Prior anterior communicating artery aneurysm coiling. No hyperdense vessel or unexpected calcification. Skull: Normal. Negative for fracture or focal lesion. Sinuses/Orbits: No acute finding. Other: None. IMPRESSION: 1. No acute intracranial abnormality. 2. Unchanged left frontal approach ventriculostomy catheter with stable ventricular size. 3. Unchanged 12 mm calcified meningioma at the left parafalcine vertex. Electronically Signed   By: Titus Dubin M.D.   On: 07/19/2021 06:34    Pertinent labs & imaging results that were available during my care of the patient were reviewed by me and considered in my medical decision making (see MDM for details).  Medications Ordered in ED Medications  losartan (COZAAR) tablet 50 mg (has no administration in time range)  Procedures Procedures  (including critical care time)  Medical Decision Making / ED Course I have reviewed the nursing notes for this encounter and the patient's prior records (if available in EHR or on provided paperwork).  Mary Higgins was evaluated in Emergency Department on 07/19/2021 for the symptoms described in the history of present illness. She was evaluated in the context of the global COVID-19 pandemic, which necessitated consideration that the patient might be at risk for infection with the SARS-CoV-2 virus that causes COVID-19. Institutional protocols and algorithms that pertain to the evaluation of patients at risk for COVID-19 are in a state of rapid change based on information released by regulatory bodies including the CDC and federal and state organizations. These policies and algorithms were followed during the patient's care in the ED.   Hypertension Complaining of headache and SOB.  Noted to have BLE edema. No focal deficit on exam  CT head, VP shunt series, screening labs ordered.  Pertinent labs & imaging results that were available during my care of the patient were reviewed by me and considered in my medical decision making:  No renal insufficiency. CT head w/o acute changes. VP shunt reassuring. Chest x-ray without evidence suggestive of pneumonia, pneumothorax, pneumomediastinum.  No abnormal contour of the mediastinum to suggest dissection. No evidence of acute injuries. EKG non-ischemic Cardiac markers reassuring  Will start patient back on her Losartan at 1/2 dose. Close PCP follow up and frequent BP checks at home  Final Clinical Impression(s) / ED Diagnoses Final diagnoses:  Headache  Other secondary hypertension   The patient appears reasonably screened and/or stabilized for discharge and I doubt any other medical condition or other [facility] requiring further screening, evaluation, or treatment in the ED at this time prior to  discharge. Safe for discharge with strict return precautions.  Disposition: Discharge  Condition: Good  I have discussed the results, Dx and Tx plan with the patient/family who expressed understanding and agree(s) with the plan. Discharge instructions discussed at length. The patient/family was given strict return precautions who verbalized understanding of the instructions. No further questions at time of discharge.    ED Discharge Orders          Ordered    losartan (COZAAR) 50 MG tablet  Daily        07/19/21 0815             Follow Up: [name], NP [place]. [place] [address] [phone]  Call  to schedule an appointment for close follow up    This chart was dictated using voice recognition software.  Despite best efforts to proofread,  errors can occur which can change the documentation meaning.    Fatima Blank, MD 07/19/21 [phone]

## 2021-07-19 NOTE — Telephone Encounter (Signed)
Patient has request refill on medication "Clonazepam '1mg'$ ". Patient last refill was 06/16/2021 with 90 tablets to be taken three times daily as needed. Medication pend and sent to PCP Dewaine Oats Carlos American, NP for approval. Please Advise.

## 2021-07-19 NOTE — ED Triage Notes (Signed)
Pt c/o high blood pressure and a headache. Pt states she checked it at home and it was 230.

## 2021-07-27 ENCOUNTER — Ambulatory Visit (INDEPENDENT_AMBULATORY_CARE_PROVIDER_SITE_OTHER): Payer: Medicare Other | Admitting: Family Medicine

## 2021-07-27 ENCOUNTER — Other Ambulatory Visit: Payer: Self-pay

## 2021-07-27 ENCOUNTER — Encounter: Payer: Self-pay | Admitting: Family Medicine

## 2021-07-27 VITALS — BP 146/98 | HR 82 | Temp 97.5°F | Wt 180.4 lb

## 2021-07-27 DIAGNOSIS — R269 Unspecified abnormalities of gait and mobility: Secondary | ICD-10-CM

## 2021-07-27 DIAGNOSIS — G4452 New daily persistent headache (NDPH): Secondary | ICD-10-CM | POA: Diagnosis not present

## 2021-07-27 DIAGNOSIS — E785 Hyperlipidemia, unspecified: Secondary | ICD-10-CM | POA: Diagnosis not present

## 2021-07-27 DIAGNOSIS — R519 Headache, unspecified: Secondary | ICD-10-CM | POA: Diagnosis not present

## 2021-07-27 DIAGNOSIS — R197 Diarrhea, unspecified: Secondary | ICD-10-CM | POA: Diagnosis not present

## 2021-07-27 LAB — SEDIMENTATION RATE: Sed Rate: 9 mm/h (ref 0–30)

## 2021-07-27 NOTE — Progress Notes (Signed)
Provider:  Alain Honey, MD  Careteam: Patient Care Team: [name], NP as PCP - General (Geriatric Medicine) Patsey Berthold, NP as Nurse Practitioner (Cardiology) Darleen Crocker, MD as Consulting Physician (Ophthalmology) Thompson Grayer, MD as Consulting Physician (Cardiology) Otelia Sergeant, OD as Referring Physician (Ophthalmology)  PLACE OF SERVICE:  [place] Directive information    Allergies  Allergen Reactions   Oxaprozin Rash    Chief Complaint  Patient presents with   Medical Management of Chronic Issues    Patient presents today for hypertension. She reports elevated blood pressure readings.   Quality Metric Gaps    Eye exam, A1C, Tdap, Covid and Flu vaccine     HPI: Patient is a 80 y.o. female .  Patient seen today after being in the emergency room recently for elevated blood pressure.  She had stopped her medicine which is hard to understand since she lives with her daughter and her daughter can help organize and take her medicine.  Blood pressure readings had been from the 950D to 326Z systolic and 12-4 30 cyst diastolic.  They suggest to the hospital to restart her losartan and today pressure is 146/98 and while not optimal it is certainly improved as long as she takes medicine.  Because of this difficulty with compliance I tried to streamline her medicines today and we reduced her medicines to 4: Bupropion, clonazepam, losartan, and metformin while discontinuing the sertraline and atorvastatin.  Hopefully this will make things easier and compliance will improve  Review of Systems:  Review of Systems  Psychiatric/Behavioral:  Positive for memory loss. The patient is nervous/anxious.   All other systems reviewed and are negative.  Past Medical History:  Diagnosis Date   Anxiety and depression    Hx of   Blepharospasm     Breast cancer ([place])    s/p lumpectomy   Carotid bruit    Bilateral. Mild to moderate disease on LEFT and mild  on the RIGHT in 2012   COPD (chronic obstructive pulmonary disease) ([place])    Diabetes mellitus without complication (HCC)    AODM   Fuchs' corneal dystrophy    GERD (gastroesophageal reflux disease)     Hyperlipidemia    Hypertension     Insomnia     Intracerebral bleed ([place]) 2003   Remote intracerebral blled and coiling by Dr. Estanislado Pandy for cerebral aneurysm ans a shunt placed by Dr. Vertell Limber remotely. No seizure disorder and this happened in 2003 with an anterior communicating aneurysm.   Neurocardiogenic syncope    a. s/p Biotronik dual chamber pacemaker   Personal history of radiation therapy    Sinus node dysfunction (HCC)     Tobacco abuse     Type II or unspecified type diabetes mellitus without mention of complication, not stated as uncontrolled     Past Surgical History:  Procedure Laterality Date   ANEURYSM COILING  2003   Cerebral aneurysm ;Dr. Luanne Bras   BREAST LUMPECTOMY Right    For cancer, no recurrence   CHOLECYSTECTOMY     PACEMAKER GENERATOR CHANGE N/A 09/25/2014   BTK dual chamber pacemkaer implanted by Dr Rayann Heman   PACEMAKER INSERTION  07/12/2006   [employer] Victory XL DR  -  Dr. Tami Ribas   PARTIAL HYSTERECTOMY     VENTRICULOPERITONEAL SHUNT  2003   Dr. Erline Levine   Social History:   reports that she has been smoking cigarettes. She started smoking about 2 years ago. She has  never used smokeless tobacco. She reports that she does not drink alcohol and does not use drugs.  Family History  Problem Relation Age of Onset   Cancer Mother    Heart attack Father    Heart attack Son        Heart Pump placed    Diabetes Son    Hypertension Other     Medications: Patient's Medications  New Prescriptions   No medications on file  Previous Medications   ALCOHOL SWABS (ALCOHOL WIPES) 70 % PADS    1 each by Does not apply route daily as needed.   ARTIFICIAL TEAR OINTMENT (DRY EYES OP)    Place 1 drop into both eyes daily as needed (dry eyes).   ASPIRIN  EC 325 MG TABLET    Take 325 mg by mouth daily.   ATORVASTATIN (LIPITOR) 40 MG TABLET    Take one tablet by mouth daily for cholesterol   BLOOD GLUCOSE MONITORING SUPPL (ONE TOUCH ULTRA 2) W/DEVICE KIT    USE UP TO FOUR TIMES DAILY AS DIRECTED   BUPROPION (WELLBUTRIN SR) 150 MG 12 HR TABLET    TAKE 1 TABLET(150 MG) BY MOUTH DAILY   CLONAZEPAM (KLONOPIN) 1 MG TABLET    Take 1 tablet (1 mg total) by mouth 3 (three) times daily as needed. NEEDS APPT BEFORE ANY ADDITIONAL REFILL.   GLUCOSE BLOOD TEST STRIP    Use as instructed   LANCETS (ONETOUCH ULTRASOFT) LANCETS    Use as instructed   LOSARTAN (COZAAR) 100 MG TABLET    TAKE 1 TABLET(100 MG) BY MOUTH DAILY   LOSARTAN (COZAAR) 50 MG TABLET    Take 1 tablet (50 mg total) by mouth daily for 14 days. Hold for blood pressures <120   METFORMIN (GLUCOPHAGE) 500 MG TABLET    TAKE 1 TABLET BY MOUTH WITH BREAKFAST AND WITH SUPPER FOR DIABETES   METOPROLOL TARTRATE (LOPRESSOR) 50 MG TABLET    TAKE 1 TABLET BY MOUTH TWICE DAILY   PANTOPRAZOLE (PROTONIX) 40 MG TABLET    TAKE 1 TABLET(40 MG) BY MOUTH DAILY   SERTRALINE (ZOLOFT) 50 MG TABLET    TAKE 1 TABLET(50 MG) BY MOUTH DAILY  Modified Medications   No medications on file  Discontinued Medications   No medications on file    Physical Exam:  There were no vitals filed for this visit. There is no height or weight on file to calculate BMI. Wt Readings from Last 3 Encounters:  07/19/21 155 lb (70.3 kg)  03/07/21 161 lb 3.2 oz (73.1 kg)  12/08/20 166 lb 6.4 oz (75.5 kg)    Physical Exam Vitals and nursing note reviewed.  Cardiovascular:     Rate and Rhythm: Normal rate.  Pulmonary:     Effort: Pulmonary effort is normal.     Breath sounds: Normal breath sounds.  Neurological:     General: No focal deficit present.     Mental Status: She is alert and oriented to person, place, and time.    Labs reviewed: Basic Metabolic Panel: Recent Labs    12/08/20 1225 07/19/21 0330  NA 136 137  K 4.5  3.2*  CL 101 102  CO2 29 24  GLUCOSE 91 139*  BUN 16 14  CREATININE 0.84 0.75  CALCIUM 9.3 9.3   Liver Function Tests: Recent Labs    12/08/20 1225 07/19/21 0625  AST 8* 13*  ALT 6 9  ALKPHOS  --  54  BILITOT 0.6 1.0  PROT 6.2 6.8  ALBUMIN  --  4.0   No results for input(s): LIPASE, AMYLASE in the last 8760 hours. No results for input(s): AMMONIA in the last 8760 hours. CBC: Recent Labs    12/08/20 1225 07/19/21 0330  WBC 5.9 7.3  NEUTROABS 3,452  --   HGB 12.0 12.9  HCT 36.6 39.5  MCV 86.1 90.6  PLT 290 295   Lipid Panel: No results for input(s): CHOL, HDL, LDLCALC, TRIG, CHOLHDL, LDLDIRECT in the last 8760 hours. TSH: No results for input(s): TSH in the last 8760 hours. A1C: Lab Results  Component Value Date   HGBA1C 6.0 (H) 12/08/2020     Assessment/Plan   1. New daily persistent headache Probably related to her blood pressure but she does have some tenderness in the temporal artery area.  Will check sed rate to rule out temporal arteritis    3. Abnormality of gait Had a fall in the bathtub yesterday no broken bones  4. Diarrhea, unspecified type This is a chronic problem.  Have stopped sertraline (if she actually developed her own) and also reduce metformin since her A1c's have been good we will check labs when she returns on once a day metformin  5. Hyperlipidemia, unspecified hyperlipidemia type LDL was 30 at last visit on atorvastatin.  I think we can safely discontinue without seeing really abnormal readings  Alain Honey, MD [place] [phone]

## 2021-07-27 NOTE — Patient Instructions (Signed)
Your meds as of today(9-14): AM: Metformin(with breakfast)                                                        Clonazepam                                                  PM Losartan                                                         Clonazepam                                                          Bupropropion

## 2021-07-28 ENCOUNTER — Telehealth: Payer: Self-pay | Admitting: *Deleted

## 2021-07-28 DIAGNOSIS — G245 Blepharospasm: Secondary | ICD-10-CM

## 2021-07-28 DIAGNOSIS — I10 Essential (primary) hypertension: Secondary | ICD-10-CM

## 2021-07-28 MED ORDER — BUPROPION HCL ER (SR) 150 MG PO TB12: 150.0000 mg | ORAL_TABLET | Freq: Every day | ORAL | 1 refills | Status: DC

## 2021-07-28 MED ORDER — CLONAZEPAM 1 MG PO TABS: 1.0000 mg | ORAL_TABLET | Freq: Three times a day (TID) | ORAL | 1 refills | Status: DC | PRN

## 2021-07-28 NOTE — Telephone Encounter (Signed)
Will refill clonazepam and bupropion but please verify with Dr.Miller about dose adjustment for losartan.I do not see on the note saying to increase losartan to 100 mg tablet.

## 2021-07-28 NOTE — Telephone Encounter (Signed)
Mary Higgins, daughter called and stated that Patient was seen yesterday by Dr. Sabra Heck and he was going to call in her medications but the pharmacy has not received them yet.   Stated that they needed: Bupropion, Losartan '100mg'$  and Clonazepam. Stated Dr. Sabra Heck was going to send Rx's yesterday  Stated that Dr. Sabra Heck was going to Increase the Losartan to '100mg'$  because blood pressure staying high.   Pended Rx's and sent to [facility] for approval (due to Dr. Sabra Heck out of office)      OV NOTE DATED 07/27/2021: Because of this difficulty with compliance I tried to streamline her medicines today and we reduced her medicines to 4: Bupropion, clonazepam, losartan, and metformin while discontinuing the sertraline and atorvastatin.  Hopefully this will make things easier and compliance will improve

## 2021-07-28 NOTE — Telephone Encounter (Signed)
Forwarded to Dr. Sabra Heck.

## 2021-08-01 MED ORDER — LOSARTAN POTASSIUM 100 MG PO TABS: ORAL_TABLET | ORAL | 1 refills | Status: DC

## 2021-08-01 NOTE — Telephone Encounter (Signed)
Mary Boxer, MD  You 1 hour ago (7:52 AM)   Keep Losartan same but be sure to break pill in hale and tame half in AM and other half at bedtime

## 2021-08-01 NOTE — Telephone Encounter (Signed)
LMOM for Kim to return call.

## 2021-08-03 ENCOUNTER — Other Ambulatory Visit: Payer: Self-pay

## 2021-08-03 ENCOUNTER — Emergency Department (HOSPITAL_COMMUNITY): Payer: Medicare Other

## 2021-08-03 ENCOUNTER — Inpatient Hospital Stay (HOSPITAL_COMMUNITY)
Admission: EM | Admit: 2021-08-03 | Discharge: 2021-08-10 | DRG: 025 | Disposition: A | Discharge status: Home Health | Payer: Medicare Other | Attending: Family Medicine | Admitting: Family Medicine

## 2021-08-03 ENCOUNTER — Encounter (HOSPITAL_COMMUNITY): Payer: Self-pay | Admitting: Internal Medicine

## 2021-08-03 ENCOUNTER — Other Ambulatory Visit (HOSPITAL_COMMUNITY): Payer: Self-pay

## 2021-08-03 ENCOUNTER — Ambulatory Visit: Payer: Medicare Other | Admitting: Nurse Practitioner

## 2021-08-03 ENCOUNTER — Observation Stay (HOSPITAL_COMMUNITY): Payer: Medicare Other

## 2021-08-03 DIAGNOSIS — Z720 Tobacco use: Secondary | ICD-10-CM | POA: Diagnosis present

## 2021-08-03 DIAGNOSIS — I671 Cerebral aneurysm, nonruptured: Secondary | ICD-10-CM | POA: Diagnosis not present

## 2021-08-03 DIAGNOSIS — R55 Syncope and collapse: Secondary | ICD-10-CM | POA: Diagnosis not present

## 2021-08-03 DIAGNOSIS — Y92009 Unspecified place in unspecified non-institutional (private) residence as the place of occurrence of the external cause: Secondary | ICD-10-CM | POA: Diagnosis not present

## 2021-08-03 DIAGNOSIS — I609 Nontraumatic subarachnoid hemorrhage, unspecified: Secondary | ICD-10-CM | POA: Diagnosis not present

## 2021-08-03 DIAGNOSIS — E1165 Type 2 diabetes mellitus with hyperglycemia: Secondary | ICD-10-CM | POA: Diagnosis not present

## 2021-08-03 DIAGNOSIS — Z79899 Other long term (current) drug therapy: Secondary | ICD-10-CM

## 2021-08-03 DIAGNOSIS — R0902 Hypoxemia: Secondary | ICD-10-CM | POA: Diagnosis not present

## 2021-08-03 DIAGNOSIS — Z8679 Personal history of other diseases of the circulatory system: Secondary | ICD-10-CM

## 2021-08-03 DIAGNOSIS — Z8249 Family history of ischemic heart disease and other diseases of the circulatory system: Secondary | ICD-10-CM

## 2021-08-03 DIAGNOSIS — S50311A Abrasion of right elbow, initial encounter: Secondary | ICD-10-CM | POA: Diagnosis not present

## 2021-08-03 DIAGNOSIS — S065XAA Traumatic subdural hemorrhage with loss of consciousness status unknown, initial encounter: Secondary | ICD-10-CM | POA: Diagnosis present

## 2021-08-03 DIAGNOSIS — G9389 Other specified disorders of brain: Secondary | ICD-10-CM | POA: Diagnosis not present

## 2021-08-03 DIAGNOSIS — M542 Cervicalgia: Secondary | ICD-10-CM | POA: Diagnosis not present

## 2021-08-03 DIAGNOSIS — F419 Anxiety disorder, unspecified: Secondary | ICD-10-CM | POA: Diagnosis present

## 2021-08-03 DIAGNOSIS — Z978 Presence of other specified devices: Secondary | ICD-10-CM

## 2021-08-03 DIAGNOSIS — Y92012 Bathroom of single-family (private) house as the place of occurrence of the external cause: Secondary | ICD-10-CM

## 2021-08-03 DIAGNOSIS — Z7982 Long term (current) use of aspirin: Secondary | ICD-10-CM | POA: Diagnosis not present

## 2021-08-03 DIAGNOSIS — Z809 Family history of malignant neoplasm, unspecified: Secondary | ICD-10-CM

## 2021-08-03 DIAGNOSIS — W1830XA Fall on same level, unspecified, initial encounter: Secondary | ICD-10-CM | POA: Diagnosis present

## 2021-08-03 DIAGNOSIS — W19XXXA Unspecified fall, initial encounter: Secondary | ICD-10-CM

## 2021-08-03 DIAGNOSIS — S0990XA Unspecified injury of head, initial encounter: Secondary | ICD-10-CM | POA: Diagnosis not present

## 2021-08-03 DIAGNOSIS — R059 Cough, unspecified: Secondary | ICD-10-CM | POA: Diagnosis not present

## 2021-08-03 DIAGNOSIS — Z923 Personal history of irradiation: Secondary | ICD-10-CM | POA: Diagnosis not present

## 2021-08-03 DIAGNOSIS — Z853 Personal history of malignant neoplasm of breast: Secondary | ICD-10-CM | POA: Diagnosis not present

## 2021-08-03 DIAGNOSIS — J9601 Acute respiratory failure with hypoxia: Secondary | ICD-10-CM | POA: Diagnosis not present

## 2021-08-03 DIAGNOSIS — Z7984 Long term (current) use of oral hypoglycemic drugs: Secondary | ICD-10-CM | POA: Diagnosis not present

## 2021-08-03 DIAGNOSIS — K219 Gastro-esophageal reflux disease without esophagitis: Secondary | ICD-10-CM | POA: Diagnosis present

## 2021-08-03 DIAGNOSIS — R402252 Coma scale, best verbal response, oriented, at arrival to emergency department: Secondary | ICD-10-CM | POA: Diagnosis present

## 2021-08-03 DIAGNOSIS — R402142 Coma scale, eyes open, spontaneous, at arrival to emergency department: Secondary | ICD-10-CM | POA: Diagnosis present

## 2021-08-03 DIAGNOSIS — Z833 Family history of diabetes mellitus: Secondary | ICD-10-CM

## 2021-08-03 DIAGNOSIS — S065X9A Traumatic subdural hemorrhage with loss of consciousness of unspecified duration, initial encounter: Principal | ICD-10-CM | POA: Diagnosis present

## 2021-08-03 DIAGNOSIS — I10 Essential (primary) hypertension: Secondary | ICD-10-CM | POA: Diagnosis not present

## 2021-08-03 DIAGNOSIS — G936 Cerebral edema: Secondary | ICD-10-CM | POA: Diagnosis not present

## 2021-08-03 DIAGNOSIS — Z950 Presence of cardiac pacemaker: Secondary | ICD-10-CM

## 2021-08-03 DIAGNOSIS — R911 Solitary pulmonary nodule: Secondary | ICD-10-CM | POA: Diagnosis present

## 2021-08-03 DIAGNOSIS — R471 Dysarthria and anarthria: Secondary | ICD-10-CM | POA: Diagnosis not present

## 2021-08-03 DIAGNOSIS — R296 Repeated falls: Secondary | ICD-10-CM | POA: Diagnosis not present

## 2021-08-03 DIAGNOSIS — R9089 Other abnormal findings on diagnostic imaging of central nervous system: Secondary | ICD-10-CM | POA: Diagnosis not present

## 2021-08-03 DIAGNOSIS — J449 Chronic obstructive pulmonary disease, unspecified: Secondary | ICD-10-CM | POA: Diagnosis present

## 2021-08-03 DIAGNOSIS — E876 Hypokalemia: Secondary | ICD-10-CM | POA: Diagnosis not present

## 2021-08-03 DIAGNOSIS — I6200 Nontraumatic subdural hemorrhage, unspecified: Secondary | ICD-10-CM | POA: Diagnosis present

## 2021-08-03 DIAGNOSIS — Z982 Presence of cerebrospinal fluid drainage device: Secondary | ICD-10-CM

## 2021-08-03 DIAGNOSIS — D649 Anemia, unspecified: Secondary | ICD-10-CM | POA: Diagnosis present

## 2021-08-03 DIAGNOSIS — S065X0A Traumatic subdural hemorrhage without loss of consciousness, initial encounter: Secondary | ICD-10-CM | POA: Diagnosis not present

## 2021-08-03 DIAGNOSIS — G9340 Encephalopathy, unspecified: Secondary | ICD-10-CM | POA: Diagnosis not present

## 2021-08-03 DIAGNOSIS — I495 Sick sinus syndrome: Secondary | ICD-10-CM | POA: Diagnosis present

## 2021-08-03 DIAGNOSIS — Z886 Allergy status to analgesic agent status: Secondary | ICD-10-CM

## 2021-08-03 DIAGNOSIS — Z86011 Personal history of benign neoplasm of the brain: Secondary | ICD-10-CM | POA: Diagnosis not present

## 2021-08-03 DIAGNOSIS — F17210 Nicotine dependence, cigarettes, uncomplicated: Secondary | ICD-10-CM | POA: Diagnosis not present

## 2021-08-03 DIAGNOSIS — Z20822 Contact with and (suspected) exposure to covid-19: Secondary | ICD-10-CM | POA: Diagnosis present

## 2021-08-03 DIAGNOSIS — R531 Weakness: Secondary | ICD-10-CM | POA: Diagnosis not present

## 2021-08-03 DIAGNOSIS — Z4682 Encounter for fitting and adjustment of non-vascular catheter: Secondary | ICD-10-CM | POA: Diagnosis not present

## 2021-08-03 DIAGNOSIS — R402362 Coma scale, best motor response, obeys commands, at arrival to emergency department: Secondary | ICD-10-CM | POA: Diagnosis present

## 2021-08-03 DIAGNOSIS — E785 Hyperlipidemia, unspecified: Secondary | ICD-10-CM | POA: Diagnosis present

## 2021-08-03 DIAGNOSIS — D329 Benign neoplasm of meninges, unspecified: Secondary | ICD-10-CM | POA: Diagnosis present

## 2021-08-03 HISTORY — DX: Presence of cardiac pacemaker: Z95.0

## 2021-08-03 HISTORY — DX: Benign neoplasm of meninges, unspecified: D32.9

## 2021-08-03 LAB — CBC WITH DIFFERENTIAL/PLATELET
Abs Immature Granulocytes: 0.06 10*3/uL (ref 0.00–0.07)
Basophils Absolute: 0.1 10*3/uL (ref 0.0–0.1)
Basophils Relative: 0 %
Eosinophils Absolute: 0 10*3/uL (ref 0.0–0.5)
Eosinophils Relative: 0 %
HCT: 35.3 % — ABNORMAL LOW (ref 36.0–46.0)
Hemoglobin: 11.7 g/dL — ABNORMAL LOW (ref 12.0–15.0)
Immature Granulocytes: 1 %
Lymphocytes Relative: 10 %
Lymphs Abs: 1.3 10*3/uL (ref 0.7–4.0)
MCH: 30.4 pg (ref 26.0–34.0)
MCHC: 33.1 g/dL (ref 30.0–36.0)
MCV: 91.7 fL (ref 80.0–100.0)
Monocytes Absolute: 0.6 10*3/uL (ref 0.1–1.0)
Monocytes Relative: 5 %
Neutro Abs: 10.3 10*3/uL — ABNORMAL HIGH (ref 1.7–7.7)
Neutrophils Relative %: 84 %
Platelets: 293 10*3/uL (ref 150–400)
RBC: 3.85 MIL/uL — ABNORMAL LOW (ref 3.87–5.11)
RDW: 14.5 % (ref 11.5–15.5)
WBC: 12.3 10*3/uL — ABNORMAL HIGH (ref 4.0–10.5)
nRBC: 0 % (ref 0.0–0.2)

## 2021-08-03 LAB — URINALYSIS, ROUTINE W REFLEX MICROSCOPIC
Bilirubin Urine: NEGATIVE
Glucose, UA: NEGATIVE mg/dL
Hgb urine dipstick: NEGATIVE
Ketones, ur: NEGATIVE mg/dL
Leukocytes,Ua: NEGATIVE
Nitrite: NEGATIVE
Protein, ur: NEGATIVE mg/dL
Specific Gravity, Urine: 1.025 (ref 1.005–1.030)
pH: 6 (ref 5.0–8.0)

## 2021-08-03 LAB — COMPREHENSIVE METABOLIC PANEL
ALT: 9 U/L (ref 0–44)
AST: 14 U/L — ABNORMAL LOW (ref 15–41)
Albumin: 3.4 g/dL — ABNORMAL LOW (ref 3.5–5.0)
Alkaline Phosphatase: 61 U/L (ref 38–126)
Anion gap: 9 (ref 5–15)
BUN: 12 mg/dL (ref 8–23)
CO2: 25 mmol/L (ref 22–32)
Calcium: 9.1 mg/dL (ref 8.9–10.3)
Chloride: 104 mmol/L (ref 98–111)
Creatinine, Ser: 0.76 mg/dL (ref 0.44–1.00)
GFR, Estimated: >60 mL/min (ref >60)
Glucose, Bld: 126 mg/dL — ABNORMAL HIGH (ref 70–99)
Potassium: 3.9 mmol/L (ref 3.5–5.1)
Sodium: 138 mmol/L (ref 135–145)
Total Bilirubin: 0.7 mg/dL (ref 0.3–1.2)
Total Protein: 6 g/dL — ABNORMAL LOW (ref 6.5–8.1)

## 2021-08-03 LAB — LIPID PANEL
Cholesterol: 125 mg/dL (ref 0–200)
HDL: 34 mg/dL — ABNORMAL LOW (ref >40)
LDL Cholesterol: 71 mg/dL (ref 0–99)
Total CHOL/HDL Ratio: 3.7 RATIO
Triglycerides: 98 mg/dL (ref <150)
VLDL: 20 mg/dL (ref 0–40)

## 2021-08-03 LAB — PROTIME-INR
INR: 1 (ref 0.8–1.2)
Prothrombin Time: 12.8 seconds (ref 11.4–15.2)

## 2021-08-03 LAB — RESP PANEL BY RT-PCR (FLU A&B, COVID) ARPGX2
Influenza A by PCR: NEGATIVE
Influenza B by PCR: NEGATIVE
SARS Coronavirus 2 by RT PCR: NEGATIVE

## 2021-08-03 LAB — TROPONIN I (HIGH SENSITIVITY)
Troponin I (High Sensitivity): 16 ng/L (ref <18)
Troponin I (High Sensitivity): 17 ng/L (ref <18)

## 2021-08-03 LAB — APTT: aPTT: 28 seconds (ref 24–36)

## 2021-08-03 LAB — CBG MONITORING, ED: Glucose-Capillary: 119 mg/dL — ABNORMAL HIGH (ref 70–99)

## 2021-08-03 MED ORDER — ONDANSETRON HCL 4 MG PO TABS: 4.0000 mg | ORAL_TABLET | Freq: Four times a day (QID) | ORAL | Status: DC | PRN

## 2021-08-03 MED ORDER — MORPHINE SULFATE (PF) 2 MG/ML IV SOLN
2.0000 mg | INTRAVENOUS | Status: DC | PRN
Start: 2021-08-03 — End: 2021-08-04

## 2021-08-03 MED ORDER — SENNOSIDES-DOCUSATE SODIUM 8.6-50 MG PO TABS: 1.0000 | ORAL_TABLET | Freq: Every evening | ORAL | Status: DC | PRN

## 2021-08-03 MED ORDER — STROKE: EARLY STAGES OF RECOVERY BOOK
Freq: Once | Status: AC
  Filled 2021-08-03: qty 1

## 2021-08-03 MED ORDER — LABETALOL HCL 5 MG/ML IV SOLN
10.0000 mg | INTRAVENOUS | Status: DC | PRN
  Administered 2021-08-04: 10 mg via INTRAVENOUS
  Filled 2021-08-03: qty 4

## 2021-08-03 MED ORDER — ONDANSETRON HCL 4 MG/2ML IJ SOLN
4.0000 mg | Freq: Four times a day (QID) | INTRAMUSCULAR | Status: DC | PRN
Start: 2021-08-03 — End: 2021-08-04

## 2021-08-03 MED ORDER — ACETAMINOPHEN 650 MG RE SUPP: 650.0000 mg | Freq: Four times a day (QID) | RECTAL | Status: DC | PRN

## 2021-08-03 MED ORDER — LOSARTAN POTASSIUM 50 MG PO TABS
50.0000 mg | ORAL_TABLET | Freq: Two times a day (BID) | ORAL | Status: DC
  Administered 2021-08-04: 50 mg via ORAL
  Filled 2021-08-03: qty 1

## 2021-08-03 MED ORDER — PANTOPRAZOLE SODIUM 40 MG IV SOLR
40.0000 mg | Freq: Every day | INTRAVENOUS | Status: DC
  Administered 2021-08-04: 40 mg via INTRAVENOUS
  Filled 2021-08-03: qty 40

## 2021-08-03 MED ORDER — ACETAMINOPHEN 325 MG PO TABS
650.0000 mg | ORAL_TABLET | Freq: Four times a day (QID) | ORAL | Status: DC | PRN
Start: 2021-08-03 — End: 2021-08-04

## 2021-08-03 MED ORDER — HYDROCODONE-ACETAMINOPHEN 5-325 MG PO TABS: 1.0000 | ORAL_TABLET | ORAL | Status: DC | PRN

## 2021-08-03 MED ORDER — BISACODYL 5 MG PO TBEC: 5.0000 mg | DELAYED_RELEASE_TABLET | Freq: Every day | ORAL | Status: DC | PRN

## 2021-08-03 NOTE — ED Provider Notes (Addendum)
[facility] EMERGENCY DEPARTMENT Provider Note   CSN: [id number] Arrival date & time: 08/03/21  1814     History Chief Complaint  Patient presents with   Loss of Consciousness    Mary Higgins is a 80 y.o. female.  Patient history is obtained from chart review, the patient herself, triage and EMS notes.  Patient presents for syncopal event.  Patient had a witnessed fall this morning where she did not hit her head but scraped her right elbow.  She states that she went to use the bathroom and she had a mechanical fall and hit the back of her head.  She denies that she lost any consciousness at all.  Patient son reports that he went to check on her and found her on the toilet completely "passed out".  Patient states she has having a headache that is unlike headache she had before, she says that she always has blurry vision.  Denies any nausea or vomiting.  Endorses pain in the back of her head, denies any neck pain.  States that she did not have any chest pain or shortness of breath, she does have a pacemaker.  Past Medical History:  Diagnosis Date   Anxiety and depression    Hx of   Blepharospasm     Breast cancer ([place])    s/p lumpectomy   Carotid bruit    Bilateral. Mild to moderate disease on LEFT and mild on the RIGHT in 2012   COPD (chronic obstructive pulmonary disease) ([place])    Diabetes mellitus without complication (HCC)    AODM   Fuchs' corneal dystrophy    GERD (gastroesophageal reflux disease)     Hyperlipidemia    Hypertension     Insomnia     Intracerebral bleed ([place]) 2003   Remote intracerebral blled and coiling by Dr. Estanislado Pandy for cerebral aneurysm ans a shunt placed by Dr. Vertell Limber remotely. No seizure disorder and this happened in 2003 with an anterior communicating aneurysm.   Neurocardiogenic syncope    a. s/p Biotronik dual chamber pacemaker   Personal history of radiation therapy    Sinus node dysfunction (HCC)     Tobacco abuse      Type II or unspecified type diabetes mellitus without mention of complication, not stated as uncontrolled      Patient Active Problem List   Diagnosis Date Noted   Headache 07/27/2021   Hyponatremia 02/07/2017   Diarrhea 12/13/2016   Actinic keratosis 12/13/2016   Falls 12/13/2016   Cervicalgia 08/18/2015   Onychomycosis 01/20/2015   Hyperlipidemia 01/20/2015   Syncope and collapse 09/10/2014   Insomnia 03/18/2014   Depression due to head injury 03/18/2014   Flank pain 03/18/2014   Blepharospasm 03/18/2014   GERD (gastroesophageal reflux disease) 03/18/2014   Hypertension 03/18/2014   DM type 2 (diabetes mellitus, type 2) ([place]) 03/18/2014   Breast mass 03/18/2014   Abnormality of gait 03/18/2014   History of fall 03/18/2014   Other malaise and fatigue 03/18/2014   Major depressive disorder with single episode 03/18/2014   Sinus node dysfunction ([place]) 12/21/2013   Tobacco abuse 12/21/2013   Edema 12/21/2013   Neurocardiogenic syncope    Subarachnoid hemorrhage ([place]) 06/01/2002    Past Surgical History:  Procedure Laterality Date   ANEURYSM COILING  2003   Cerebral aneurysm ;Dr. Luanne Bras   BREAST LUMPECTOMY Right    For cancer, no recurrence   CHOLECYSTECTOMY     PACEMAKER GENERATOR CHANGE N/A 09/25/2014  BTK dual chamber pacemkaer implanted by Dr Rayann Heman   PACEMAKER INSERTION  07/12/2006   [employer] Jude Victory XL DR  -  Dr. Tami Ribas   PARTIAL HYSTERECTOMY     VENTRICULOPERITONEAL SHUNT  2003   Dr. Erline Levine     OB History   No obstetric history on file.     Family History  Problem Relation Age of Onset   Cancer Mother    Heart attack Father    Heart attack Son        Heart Pump placed    Diabetes Son    Hypertension Other     Social History   Tobacco Use   Smoking status: Some Days    Years: 15.00    Types: Cigarettes    Start date: 01/12/2019   Smokeless tobacco: Never   Tobacco comments:    Smokes 1 cig daily   Vaping Use   Vaping Use:  Never used  Substance Use Topics   Alcohol use: No    Alcohol/week: 0.0 standard drinks   Drug use: No    Home Medications Prior to Admission medications   Medication Sig Start Date End Date Taking? Authorizing Provider  Artificial Tear Ointment (DRY EYES OP) Place 1 drop into both eyes daily as needed (dry eyes).    [provider]  aspirin EC 325 MG tablet Take 325 mg by mouth daily.    [provider]  Blood Glucose Monitoring Suppl (ONE TOUCH ULTRA 2) w/Device KIT USE UP TO FOUR TIMES DAILY AS DIRECTED 02/16/21   [name], NP  buPROPion (WELLBUTRIN SR) 150 MG 12 hr tablet Take 1 tablet (150 mg total) by mouth daily. 07/28/21   Ngetich, Dinah C, NP  clonazePAM (KLONOPIN) 1 MG tablet Take 1 tablet (1 mg total) by mouth 3 (three) times daily as needed. 07/28/21   Ngetich, Dinah C, NP  losartan (COZAAR) 100 MG tablet Take 1/2 tablet by mouth in the morning and Take 1/2 tablet by mouth in the evening. 08/01/21   Wardell Honour, MD  metFORMIN (GLUCOPHAGE) 500 MG tablet TAKE 1 TABLET BY MOUTH WITH BREAKFAST AND WITH SUPPER FOR DIABETES Patient taking differently: Take 500 mg by mouth daily with supper. 05/25/21   [name], NP  pantoprazole (PROTONIX) 40 MG tablet TAKE 1 TABLET(40 MG) BY MOUTH DAILY Patient taking differently: Take 40 mg by mouth daily. 01/19/21   [name], NP    Allergies    Oxaprozin  Review of Systems   Review of Systems  Constitutional:  Negative for chills, fatigue and fever.  HENT:  Negative for ear pain and sore throat.   Eyes:  Negative for photophobia, pain and visual disturbance.  Respiratory:  Negative for cough and shortness of breath.   Cardiovascular:  Positive for syncope. Negative for chest pain and palpitations.  Gastrointestinal:  Negative for abdominal pain, nausea and vomiting.  Genitourinary:  Negative for dysuria and hematuria.  Musculoskeletal:  Negative for arthralgias, back pain, myalgias and neck pain.   Skin:  Negative for color change and rash.  Neurological:  Positive for syncope and headaches. Negative for seizures and weakness.  All other systems reviewed and are negative.  Physical Exam Updated Vital Signs BP (!) 142/76 (BP Location: Right Arm)   Pulse 90   Temp 97.9 F (36.6 C) (Oral)   Resp 19   LMP  (LMP Unknown)   SpO2 96%   Physical Exam Vitals and nursing note reviewed. Exam conducted  with a chaperone present.  Constitutional:      General: She is not in acute distress.    Appearance: Normal appearance.  HENT:     Head: Normocephalic and atraumatic.  Eyes:     General: No scleral icterus.       Right eye: No discharge.        Left eye: No discharge.     Extraocular Movements: Extraocular movements intact.     Pupils: Pupils are equal, round, and reactive to light.     Comments: No nystagmus  Cardiovascular:     Rate and Rhythm: Normal rate and regular rhythm.     Pulses: Normal pulses.     Heart sounds: Normal heart sounds. No murmur heard.   No friction rub. No gallop.  Pulmonary:     Effort: Pulmonary effort is normal. No respiratory distress.     Breath sounds: Normal breath sounds.  Abdominal:     General: Abdomen is flat. Bowel sounds are normal. There is no distension.     Palpations: Abdomen is soft.     Tenderness: There is no abdominal tenderness.  Musculoskeletal:     Cervical back: Normal range of motion.     Comments: Abrasion noted to the right elbow.  Full range of motion.  No tenderness palpation of the hips bilaterally.  No tenderness to palpation of the lower extremities bilaterally.  No midline tenderness to the neck.  No midline back tenderness, paraspinal tenderness present.  Skin:    General: Skin is warm and dry.     Coloration: Skin is not jaundiced.  Neurological:     Mental Status: She is alert and oriented to person, place, and time. Mental status is at baseline.     Coordination: Coordination normal.     Comments: Grip strength  equal bilaterally, patient is able to raise both legs.  Cranial nerves III through XII are grossly intact.   ED Results / Procedures / Treatments   Labs (all labs ordered are listed, but only abnormal results are displayed) Labs Reviewed  CBC WITH DIFFERENTIAL/PLATELET  URINALYSIS, ROUTINE W REFLEX MICROSCOPIC  COMPREHENSIVE METABOLIC PANEL  CBG MONITORING, ED  TROPONIN I (HIGH SENSITIVITY)    EKG None  Radiology No results found.  Procedures .Critical Care Performed by: Sherrill Raring, PA-C Authorized by: Sherrill Raring, PA-C   Critical care provider statement:    Critical care time (minutes):  45   Critical care start time:  08/03/2021 8:54 PM   Critical care end time:  08/03/2021 10:54 PM   Critical care was necessary to treat or prevent imminent or life-threatening deterioration of the following conditions:  CNS failure or compromise   Critical care was time spent personally by me on the following activities:  Discussions with consultants, evaluation of patient's response to treatment, examination of patient, ordering and performing treatments and interventions, ordering and review of laboratory studies, ordering and review of radiographic studies, pulse oximetry, re-evaluation of patient's condition, obtaining history from patient or surrogate and review of old charts   Care discussed with: admitting provider     Medications Ordered in ED Medications - No data to display  ED Course  I have reviewed the triage vital signs and the nursing notes.  Pertinent labs & imaging results that were available during my care of the patient were reviewed by me and considered in my medical decision making (see chart for details).  Clinical Course as of 08/03/21 2255  Wed Aug 03, 2021  1846 Attempted calling patient's son at [phone] [HS]  1945 Second attempt, no answer. VM not set up [HS]  [place](!) [HS]    Clinical Course User Index [HS] Sherrill Raring, PA-C   MDM  Rules/Calculators/A&P                           Patient vitals are stable, nontoxic-appearing.  Does have a history of vasovagal syncopal events as well as sick sinus syndrome that has been corrected with a pacemaker.  Patient denies any chest pain but due to age and syncopal event will check troponin.  Will also order CT head given she is having a headache and likable syncope due to mechanical fall.  Unclear if she had a vasovagal response on the toilet or if she had a mechanical fall the second event based on that she conflicting histories.  She is neurologically intact.   Patient has leukocytosis compared to 2 weeks ago.  This could be reactionary from the mechanical fall, but no obvious source for infection yet noted.  She is not febrile or tachycardic, doubt this is sepsis.  Radiograph is negative for pneumonia. Initial troponin 16, will need a second to compare delta Trop.  No gross electrolyte derangement on CMP.  No AKI, no elevated LFTs concerning for biliary process  Arousable, neurologically in tact. No code on file. Has 2.3 cm subdural with 9 mm shift per radiology call.  Complains of mild headache, traumatic subdural.  Takes aspirin daily but is not anticoagulated. Left Craniotomy with VP shunt placed 18 years ago, unknown reason. Will consult neurosurgery.   Spoke with Dr. Zada Finders with neurosurgery.  He told me on the phone that he had independently reviewed the CT scan.  I verbally told him that she had a 2.3 cm subdural with a 9 mm shift.  We discussed the patient's case, he states that he feels she is stable because she has a mild headache, is neurologically intact, and is only complaining of a headache, is not on any blood thinners.  He will see the patient in the morning, advises admit her to be admitted to medicine.  Spoke with the hospitalist, I appreciate her consult and she is willing to admit the patient with consultation from neurosurgery on management.  Discussed HPI,  physical exam and plan of care for this patient with attending Dene Gentry. The attending physician evaluated this patient as part of a shared visit and agrees with plan of care.   Final Clinical Impression(s) / ED Diagnoses Final diagnoses:  None    Rx / DC Orders ED Discharge Orders     None        Sherrill Raring, PA-C 08/03/21 2255    Sherrill Raring, PA-C 08/03/21 2256    Valarie Merino, MD 08/04/21 1336

## 2021-08-03 NOTE — ED Notes (Signed)
Able to reach family for provider. Provider spoke with daughter. Delanna Notice daughter [phone] call with updates or changes

## 2021-08-03 NOTE — ED Notes (Signed)
Returned from radiology at this time 

## 2021-08-03 NOTE — ED Notes (Signed)
Returned from radiology. 

## 2021-08-03 NOTE — ED Notes (Signed)
Attempted to interrogate pacemaker unsuccessful. Spoke with rep and was advised that they would be here in 30-45 min

## 2021-08-03 NOTE — ED Notes (Signed)
Placed pt on 2lpm [place] due to O2 sats showing mid 70 and 80 at this time

## 2021-08-03 NOTE — ED Notes (Signed)
Admit provider at bedside. D/c [place] to determine if it was the pulse ox cord or if the pt sats was actually decreasing

## 2021-08-03 NOTE — ED Triage Notes (Signed)
Pt BIB EMS - had one witnessed fall this AM 0830, did not hit head, no LOC, pt not on thinners. This afternoon pt's son went to check on her in [place] and found her "passed out" on toilet. Lives with son at home.

## 2021-08-03 NOTE — ED Notes (Signed)
Received verbal report from Gloria G RN at this time 

## 2021-08-03 NOTE — H&P (Signed)
History and Physical    [place] OIN:[id number] DOB: Jun 26, 1941 DOA: 08/03/2021  PCP: [name], NP and  Alain Honey, MD [place] [phone]   Cardiologist: Does not follow with a specific cardiologist at this time  Patient coming from: Home; lives with family, both daughter and son  I have personally briefly reviewed patient's old medical records in [facility].  Chief Complaint: Fall and loss of consciousness at home  HPI: Mary Higgins is a 80 y.o. female with medical history significant for intracerebral hemorrhage 2003 with coiling for anterior communicating cerebral aneurysm and VP shunt placement, pacemaker, hypertension, diabetes, remote history of breast cancer treated with lumpectomy and radiation, history of mild COPD does not use inhalers, who presents to the emergency department on 08/03/2021 with a fall at home and episode of passing out.  On 08/03/2021 at approximately 4:30 AM, the patient had a witnessed fall at home; she did not pass out at the time but did hit the back of her head on the bathtub. She did scrape her right elbow. After the fall she was more sleepy than usual and spent most of the day sleeping. Patient's son checked on the patient in her bedroom later that day around 4:30 PM in the afternoon and found patient unconscious sitting on the toilet.  Patient does report she has a headache; she has had a daily headache lately and has seen her primary care for this.  Current headache is worse than her usual headache. She was checked on at several times during the day but she was sleeping. Onset of patient's symptoms was 08/03/21 around 4:30 AM or earlier and duration is intermittent. Regarding her headache: Pain was located in the back of her head, does not radiate, was up to 10/10 at times and was characterized as squeezing. Her daily headache is usually 7/10, is located in the left side and left frontal areas.  Symptoms are alleviated by nothing and exacerbated by nothing. Associated symptoms: Headache that is left sided and daily x 1-2 weeks; new posterior headache started after the fall.  Chronic blurry vision that is unchanged. She had a crazy dream about doctors asking her for a urine sample on the day of admission. She currently has some slow speech that may have started at home but family is unsure when it started. Increased somnolence that is new since she hit her head. No shortness of breath, cough, fever, chills. No difficulty swallowing, new focal weakness, numbness, or facial droop.  ED Course: Labs include high-sensitivity troponin 16, glucose 126, WBCs 12.3, hemoglobin 11.7 (baseline 11.5-11.9). Head CT revealed " 1. Large right holo hemispheric mixed density subdural collection measures up to 2.3 cm, with acute hemorrhage as well as low-density components. There is mass effect on the subjacent brain parenchyma with 9 mm right to left midline shift. Findings are new from CT earlier this month. 2. Mass effect on the lateral ventricles with decreased ventricular size from prior exam, particularly the right lateral ventricle. 3. Stable positioning of ventriculoperitoneal shunt catheter from a left frontal approach. 4. Stable left parafalcine calcified meningioma."  CT of the C-spine showed no acute process but did show degenerative changes in the C-spine and a 3 mm right upper lobe lung nodule.  Chest x-ray showed no acute process.  ED staff attempted to interrogate the pacemaker but they were unsuccessful.  Patient was placed on oxygen by nasal cannula because oxygen saturations decreased; they had decreased to  90% but then peripheral saturation showing 78%. The lower O2 sat 78% was thought to be an error of pulse oximetry; O2 saturations improved without further episodes. ED provider discussed case with neurosurgeon, who reviewed the images and recommended admission to medicine service and did not feel  patient needed emergent surgery but would need monitoring.    Recent History:  Patient was in the emergency department 07/19/21 due to elevated blood pressures 180s-200s/90s-130s because she stopped taking her blood pressure medication.  She had follow-up with primary care on 07/27/2021; she had restarted her losartan and blood pressure was 146/98.  Medications were decreased to 4: Losartan (same dose but take half tablet bid), metformin, bupropion, and clonazepam.  During that visit, patient noted to have a daily persistent headache and did have some tenderness in the temporal artery area.  Patient was also noted to have had a fall in the bathtub on 07/26/2021.  She apparently has chronic diarrhea, and therefore metformin was decreased.  LDL on previous visit was only 30, so atorvastatin was stopped at this visit.   Review of Systems: As per HPI otherwise all other systems reviewed and are unremarable.  CARDIOVASCULAR: No chest pain or palpitations.    Past Medical History:  Diagnosis Date   Anxiety and depression    Hx of   Blepharospasm     Breast cancer ([place])    s/p lumpectomy   Carotid bruit    Bilateral. Mild to moderate disease on LEFT and mild on the RIGHT in 2012   COPD (chronic obstructive pulmonary disease) ([place])    Diabetes mellitus without complication (HCC)    AODM   Fuchs' corneal dystrophy    GERD (gastroesophageal reflux disease)     Hyperlipidemia    Hypertension     Insomnia     Intracerebral bleed ([place]) 2003   Remote intracerebral blled and coiling by Dr. Estanislado Pandy for cerebral aneurysm ans a shunt placed by Dr. Vertell Limber remotely. No seizure disorder and this happened in 2003 with an anterior communicating aneurysm.   Meningioma ([place])    left parafalcine calcified meningioma   Neurocardiogenic syncope    a. s/p Biotronik dual chamber pacemaker   Pacemaker    Personal history of radiation therapy    S/P VP shunt 2003   Sinus node dysfunction (HCC)     Tobacco abuse      Type II or unspecified type diabetes mellitus without mention of complication, not stated as uncontrolled      Past Surgical History:  Procedure Laterality Date   ANEURYSM COILING  2003   Cerebral aneurysm ;Dr. Luanne Bras   BREAST LUMPECTOMY Right    For cancer, no recurrence   CHOLECYSTECTOMY     PACEMAKER GENERATOR CHANGE N/A 09/25/2014   BTK dual chamber pacemkaer implanted by Dr Rayann Heman   PACEMAKER INSERTION  07/12/2006   [employer] Victory XL DR  -  Dr. Tami Ribas   PARTIAL HYSTERECTOMY     VENTRICULOPERITONEAL SHUNT  2003   Dr. Erline Levine    Social History   Social History   Tobacco Use   Smoking status: Some Days    Years: 15.00    Types: Cigarettes    Start date: 01/12/2019   Smokeless tobacco: Never   Tobacco comments:    Smokes 1-3 cig daily as of 07/2021. Started as a teenager, smoked 1 ppd, but then quit cold [place] x years. Restarted 2019.  Vaping Use   Vaping Use: Never used  Substance Use Topics  Alcohol use: No    Alcohol/week: 0.0 standard drinks   Drug use: Never     Allergies  Allergen Reactions   Oxaprozin Rash    Family History  Problem Relation Age of Onset   Cancer Mother    Heart attack Father    Heart attack Son        Heart Pump placed    Diabetes Son    Hypertension Other      Home Medications  Prior to Admission medications   Medication Sig Start Date End Date Taking? Authorizing Provider  Artificial Tear Ointment (DRY EYES OP) Place 1 drop into both eyes daily as needed (dry eyes).    [provider]  aspirin EC 325 MG tablet Take 325 mg by mouth daily.    [provider]  Blood Glucose Monitoring Suppl (ONE TOUCH ULTRA 2) w/Device KIT USE UP TO FOUR TIMES DAILY AS DIRECTED 02/16/21   [name], NP  buPROPion (WELLBUTRIN SR) 150 MG 12 hr tablet Take 1 tablet (150 mg total) by mouth daily. 07/28/21   Ngetich, Dinah C, NP  clonazePAM (KLONOPIN) 1 MG tablet Take 1 tablet (1 mg total) by mouth 3  (three) times daily as needed. 07/28/21   Ngetich, Dinah C, NP  losartan (COZAAR) 100 MG tablet Take 1/2 tablet by mouth in the morning and Take 1/2 tablet by mouth in the evening. 08/01/21   Wardell Honour, MD  metFORMIN (GLUCOPHAGE) 500 MG tablet TAKE 1 TABLET BY MOUTH WITH BREAKFAST AND WITH SUPPER FOR DIABETES Patient taking differently: Take 500 mg by mouth daily with supper. 05/25/21   [name], NP  pantoprazole (PROTONIX) 40 MG tablet TAKE 1 TABLET(40 MG) BY MOUTH DAILY Patient taking differently: Take 40 mg by mouth daily. 01/19/21   [name], NP    Physical Exam:  Constitutional: NAD, calm, comfortable, ill-appearing. Vitals:   08/03/21 1955 08/03/21 2000 08/03/21 2100 08/03/21 2130  BP: (!) 144/94 (!) 148/77 (!) 156/100 (!) 145/124  Pulse: 94 92  (!) 46  Resp: 20 (!) 21 (!) 22 20  Temp:      TempSrc:      SpO2: 91% 90%  (!) 78% (Note: thought to be due to dysfunctioning Pulse ox)    Date/ Time Temp Pulse ECG Heart Rate Resp BP Patient Position (if appropriate) SpO2 O2 Device O2 Flow Rate (L/min) FiO2 (%) Weight Who  08/03/21 2300 -- 91 92 16 174/100 Abnormal  -- 99 % -- -- -- -- LE  08/03/21 2200 -- 86 87 15 165/102 Abnormal  -- 100 % -- -- -- -- LE  08/03/21 2130 -- 46 Abnormal  94 20 145/124 Abnormal  -- 78 % Abnormal  (Note: thought to be due to dysfunctioning Pulse ox) -- -- -- -- LE  08/03/21 2100 -- -- 92 22 Abnormal  156/100 Abnormal  -- -- -- -- -- -- LE  08/03/21 2000 -- 92 93 21 Abnormal  148/77 Abnormal  -- 90 % -- -- -- -- LE  08/03/21 19:55:47 -- -- -- -- -- -- -- Room Air -- -- -- LE  08/03/21 1955 -- 94 94 20 144/94 Abnormal  -- 91 % -- -- -- -- LE  08/03/21 1819 97.9 F (36.6 C) 90 -- 19 142/76 Abnormal  Lying 96 % Room Air -- -- -- GG  08/03/21 1815 -- -- -- -- -- -- 96 % -- -- -- -- GG    Eyes: Pupils equal and  round, lids and conjunctivae without icterus or erythema. ENMT: Mucous membranes are dry. Posterior pharynx clear of any  exudate or lesions. Nares patent without discharge or bleeding.  Normocephalic, atraumatic.  Normal dentition.  Neck: normal, supple, no masses, trachea midline.  Thyroid nontender, no masses appreciated, no thyromegaly. Respiratory: clear to auscultation bilaterally. Chest wall movements are symmetric. No wheezing, no crackles.  No rhonchi.  Normal respiratory effort. No accessory muscle use.  Cardiovascular: Regular rate and rhythm, no murmurs / rubs / gallops. Pulses: Radial pulses 2+ bilaterally. DP and PT pulses 1+ bilaterally. No carotid bruits.  Capillary refill less than 3 seconds. Edema: None bilaterally. GI: soft, non-distended, normal active bowel sounds. No hepatosplenomegaly. No rigidity, rebound, or guarding. Non-tender. No masses palpated. No CVA tenderness bilaterally. Musculoskeletal: no clubbing / cyanosis. No joint deformity upper and lower extremities. Good ROM, no contractures. Normal muscle tone.  No tenderness or deformity in the back bilaterally. Integument: no rashes, lesions, ulcers. No induration. Clean, dry, intact. Neurologic: CN 2-12 grossly intact. Sensation grossly intact to light touch. DTR 2+ bilaterally.  Babinski: Toes non-reactive bilaterally.  Strength 4/5 in all 4.  Intact rapid alternating movements bilaterally.  No pronator drift. Intact finger to nose bilaterally. Intact heel to shin bilaterally. No visual field deficit appreciated. Speech somewhat slow but not actually slurred. Psychiatric: Normal judgment and insight. Alert and oriented x 2. Knew the month but said the year was 2023. Normal mood.  Frequently somnolent. GCS: Eye: 4   Verbal: 5    Motor: 6    Total: 15 Lymphatic: No cervical lymphadenopathy. No supraclavicular lymphadenopathy.    NIH Stroke Scale   1a. Level of Consciousness (0-3)   1b. LOC Questions (0-2)               1c. LOC Commands (0-2)   1 (somnolent) 0 0  2. Best gaze (0-2) 0  3. Visual (0-3) 0  4. Facial palsy (0-3) 0  5a.  Motor arm - Left arm (0-4) 5b. Motor arm - Right arm (0-4) 0 0   6a. Motor leg - Left leg (0-4) 6b. Motor leg - Right leg (0-4) 0 0  7. Limb ataxia (0-2) 0  8. Sensory (0-2) 0  9. Best language (0-3) 0  10. Dysarthria (0-2) 1 (slow speech)  11. Extinction and inattention (0-2) 0  TOTAL NIH STROKE SCALE 2        Labs on Admission: I have personally reviewed the following labs and imaging studies.  CBC: Recent Labs  Lab 08/03/21 1821  WBC 12.3*  NEUTROABS 10.3*  HGB 11.7*  HCT 35.3*  MCV 91.7  PLT 063    Basic Metabolic Panel: Recent Labs  Lab 08/03/21 1821  NA 138  K 3.9  CL 104  CO2 25  GLUCOSE 126*  BUN 12  CREATININE 0.76  CALCIUM 9.1    GFR: Estimated Creatinine Clearance: 62.7 mL/min (by C-G formula based on SCr of 0.76 mg/dL).  Liver Function Tests: Recent Labs  Lab 08/03/21 1821  AST 14*  ALT 9  ALKPHOS 61  BILITOT 0.7  PROT 6.0*  ALBUMIN 3.4*    Urine analysis: Awaiting sample  Radiological Exams on Admission: DG Chest 2 View  Result Date: 08/03/2021 CLINICAL DATA:  Unwitnessed fall this morning due to syncopal episode. EXAM: CHEST - 2 VIEW COMPARISON:  07/19/2021 FINDINGS: Cardiac pacemaker. Surgical clips in the axilla bilaterally. Catheter placed over the left mediastinum possibly a ventricular peritoneal shunt. Heart size and pulmonary  vascularity are normal. Lungs are clear. No pleural effusions. No pneumothorax. Mediastinal contours appear intact. Calcification of the aorta. Old left rib fractures. IMPRESSION: No active cardiopulmonary disease. Electronically Signed   By: Lucienne Capers M.D.   On: 08/03/2021 19:36   CT Head Wo Contrast  Result Date: 08/03/2021 CLINICAL DATA:  Headache, intracranial hemorrhage suspected Unwitnessed fall. EXAM: CT HEAD WITHOUT CONTRAST TECHNIQUE: Contiguous axial images were obtained from the base of the skull through the vertex without intravenous contrast. COMPARISON:  Most recent head CT  07/19/2021 FINDINGS: Brain: Large right holo hemispheric subdural collection. This has both acute hemorrhage components as well as low-density components and measures up to 2.3 cm, measured on series 5, image 35. There is mass effect on the subjacent brain parenchyma with 9 mm right to left midline shift. Decreased ventricular size from prior exam, particularly the right lateral ventricle. No subarachnoid, intraventricular, or parenchymal hemorrhagic component. Stable left parafalcine 13 mm calcified meningioma posteriorly. Stable positioning of ventriculoperitoneal shunt catheter from a left frontal approach. Vascular: Coiling in the region of the anterior communicating artery. Skull base atherosclerosis. No hyperdense vessel. Skull: No skull fracture. Sinuses/Orbits: No acute findings. Mild paranasal sinus mucosal thickening. No mastoid effusion. Bilateral cataract resection. Other: None. IMPRESSION: 1. Large right holo hemispheric mixed density subdural collection measures up to 2.3 cm, with acute hemorrhage as well as low-density components. There is mass effect on the subjacent brain parenchyma with 9 mm right to left midline shift. Findings are new from CT earlier this month. 2. Mass effect on the lateral ventricles with decreased ventricular size from prior exam, particularly the right lateral ventricle. 3. Stable positioning of ventriculoperitoneal shunt catheter from a left frontal approach. 4. Stable left parafalcine calcified meningioma. Critical Value/emergent results were called by telephone at the time of interpretation on 08/03/2021 at 8:31 pm to provider HALEY SAGE , who verbally acknowledged these results. Electronically Signed   By: Keith Rake M.D.   On: 08/03/2021 20:35   CT Cervical Spine Wo Contrast  Result Date: 08/03/2021 CLINICAL DATA:  Recent fall with neck pain EXAM: CT CERVICAL SPINE WITHOUT CONTRAST TECHNIQUE: Multidetector CT imaging of the cervical spine was performed without  intravenous contrast. Multiplanar CT image reconstructions were also generated. COMPARISON:  None. FINDINGS: Alignment: Within normal limits. Skull base and vertebrae: Cervical segments are well visualized. Vertebral body height is well maintained. No acute fracture or acute facet abnormality is noted. Multilevel facet hypertrophic changes are seen particularly on the right. The odontoid is within normal limits. Soft tissues and spinal canal: Surrounding soft tissue structures demonstrate significant vascular calcifications within the carotid arteries. Shunt catheter is noted in the left neck. Upper chest: Visualized lung apices show evidence of a tiny nodule within the right upper lobe measuring 3 mm. No other focal abnormality is noted. Other: None IMPRESSION: Multilevel degenerative change without acute bony abnormality. 3 mm right solid pulmonary nodule within the upper lobe. If patient is low risk for malignancy, no routine follow-up imaging is recommended; if patient is high risk for malignancy, a non-contrast Chest CT at 12 months is optional. If performed and the nodule is stable at 12 months, no further follow-up is recommended. These guidelines do not apply to immunocompromised patients and patients with cancer. Follow up in patients with significant comorbidities as clinically warranted. For lung cancer screening, adhere to Lung-RADS guidelines. Reference: Radiology. 2017; 284(1):228-43. Electronically Signed   By: Inez Catalina M.D.   On: 08/03/2021 20:40    EKGs:  Independently reviewed.   EKG #1: 94 bpm.  Normal sinus rhythm.  Slight T wave inversion in lead III.  Minimal J-point elevation in V2.  EKG #2: 90 bpm.  Normal sinus rhythm.  T wave inversion in lead III is resolved.  J-point elevation in V2 is decreased.    Echocardiogram, 03/31/2013: Study Conclusions  - Left ventricle: The cavity size was normal. Wall thickness was increased in a pattern of mild LVH. There was mild concentric  hypertrophy. Systolic function was normal. The estimated ejection fraction was in the range of 55% to 60%. Wall motion was normal; there were no regional wall motion abnormalities. Doppler parameters are consistent with abnormal left ventricular relaxation (grade 1 diastolic dysfunction).  - Mitral valve: Calcified annulus. Moderately calcified leaflets posterior. Mild regurgitation. Valve area by pressure half-time: 2.22cm^2.  - Atrial septum: No defect or patent foramen ovale was identified.  Impressions:  - Overriding PTVP with sinus rhythm. No intraventricular dyssyndhrony.     Assessment/Plan  Principal Problem:  Subdural hematoma (HCC) On CT 2.3 cm subdural hematoma with 9 mm midline shift. Symptoms include headache, somnolence, and slowed speech. Has chronic blurry vision. ED discussed case with neurosurgeon, who will consult. Plan: Consult neurosurgeon. Admit to intermediate unit.  No antiplatelet or anticoagulant medications. NPO until patient passes swallow evaluation. Frequent neuro checks. Will use Intracranial hemorrhage order set to include neurologic checks and orders, although patient does not have a hemorrhagic stroke. Blood pressure parameters and IV labetalol if elevated. Hold home Klonopin due to somnolence and need for accurate neuro checks; consider restart depending on neurosurgery recommendations. PT/OT/ST after neurosurgeon evaluates patient. UPDATE: Discussed case with neurosurgeon. Plan is for NPO after midnight and surgery on 08/04/21.   Active Problems:    Midline shift of brain CT head notes 9 mm midline shift of brain. Plan: Neurosurgeon aware and has reviewed the imaging. Frequent neuro checks. Plan for surgery on 08/04/21.    Syncope and collapse May be related to head trauma from earlier on day of presentation. Pt also somnolent most of the day of presentation. Plan: Telemetry. Neuro checks.     Type 2 diabetes mellitus with hyperglycemia,  without long-term current use of insulin ([place]) Plan: Hold oral diabetes medications.  Check fingerstick blood sugars q ac and hs.  Sliding scale insulin.      Fall at home, initial encounter [place] and hit her head 08/03/21 at 4:30 am. May be the cause of the subdural. Plan: Neuro checks. PT/OT after surgery.      Meningioma ([place]) Found on imaging. Plan: Family informed. Outpatient follow up.    History of cerebral aneurysm repair with subarachnoid hemorrhage, History of VP shunt In 2003. No events since then and no known follow up for VP shunt. Plan: Neurosurgeon to address.    Anemia Chronic anemia; admission hemoglobin within patient's usual range. Plan: CBC in the am.    Primary hypertension Plan: Continue home medication. PRN IV medication.    Tobacco abuse Plan: Counseled to quit.    Anxiety Plan: Hold bupropion due to risk of lowering seizure threshold. Hold clonazepam so that neurologic evaluation is accurate.    Pacemaker Noted. Plan: Outpatient follow up.    Solitary pulmonary nodule Incidentally found on imaging was a 3 mm right solid pulmonary nodule within the upper lobe. Plan: Patient's daughter informed and told to follow up with primary care to schedule a CT lungs in 12 months for recheck.     DVT prophylaxis: SCDs. No  anticoagulants due to subdural hematoma.  Code Status:   Full Code Discussed code options with patient's daughter, who wishes the patient to remain full code.  Family Communication:     Delanna Notice daughter [phone], was at bedside; call with updates or changes Eugene Gavia, son, [phone] is second contact Either child may make medical decisions for the patient   Disposition Plan:   Patient is from:  Home  Anticipated DC to:  Home  Anticipated DC date:  08/04/21  Anticipated DC barriers: neurologic condition   Consults called:  Dr. Zada Finders, neurosurgeon   Admission status:  Observation   Severity of Illness: The  appropriate patient status for this patient is OBSERVATION. Observation status is judged to be reasonable and necessary in order to provide the required intensity of service to ensure the patient's safety. The patient's presenting symptoms, physical exam findings, and initial radiographic and laboratory data in the context of their medical condition is felt to place them at decreased risk for further clinical deterioration. Furthermore, it is anticipated that the patient will be medically stable for discharge from the hospital within 2 midnights of admission. The following factors support the patient status of observation.   " The patient's presenting symptoms include somnolence, headache, slow speech. " The physical exam findings include somnolence, slow speech. " The initial radiographic and laboratory data are concerning for subdural hematoma with midline shift.  Neurosurgeon recommended observation.    Tacey Ruiz MD [facility]  How to contact the [facility] Attending or Consulting provider [place] or covering provider during after hours [place], for this patient?   Check the care team in [facility] and look for a) attending/consulting [facility] provider listed and b) the [facility] team listed Log into [email or web address] and use [employer]'s universal password to access. If you do not have the password, please contact the hospital operator. Locate the [facility] provider you are looking for under [facility] and page to a number that you can be directly reached. If you still have difficulty reaching the provider, please page the [facility] (Director on Call) for the Hospitalists listed on amion for assistance.  08/03/2021, 9:37 PM

## 2021-08-03 NOTE — ED Notes (Signed)
Interrogater rep at bedside. States its not their pacemaker they are unable to interrogate her pacemaker

## 2021-08-04 ENCOUNTER — Encounter (HOSPITAL_COMMUNITY)
Admission: EM | Disposition: A | Discharge status: Home Health | Payer: Self-pay | Source: Home / Self Care | Attending: Internal Medicine

## 2021-08-04 ENCOUNTER — Observation Stay (HOSPITAL_COMMUNITY): Payer: Medicare Other

## 2021-08-04 ENCOUNTER — Inpatient Hospital Stay (HOSPITAL_COMMUNITY): Payer: Medicare Other | Admitting: Anesthesiology

## 2021-08-04 ENCOUNTER — Encounter (HOSPITAL_COMMUNITY): Payer: Self-pay | Admitting: Internal Medicine

## 2021-08-04 ENCOUNTER — Inpatient Hospital Stay (HOSPITAL_COMMUNITY): Payer: Medicare Other

## 2021-08-04 DIAGNOSIS — W1830XA Fall on same level, unspecified, initial encounter: Secondary | ICD-10-CM | POA: Diagnosis present

## 2021-08-04 DIAGNOSIS — Z8679 Personal history of other diseases of the circulatory system: Secondary | ICD-10-CM | POA: Diagnosis not present

## 2021-08-04 DIAGNOSIS — I10 Essential (primary) hypertension: Secondary | ICD-10-CM

## 2021-08-04 DIAGNOSIS — D329 Benign neoplasm of meninges, unspecified: Secondary | ICD-10-CM | POA: Diagnosis present

## 2021-08-04 DIAGNOSIS — R9089 Other abnormal findings on diagnostic imaging of central nervous system: Secondary | ICD-10-CM

## 2021-08-04 DIAGNOSIS — I6200 Nontraumatic subdural hemorrhage, unspecified: Secondary | ICD-10-CM | POA: Diagnosis present

## 2021-08-04 DIAGNOSIS — Z7982 Long term (current) use of aspirin: Secondary | ICD-10-CM | POA: Diagnosis not present

## 2021-08-04 DIAGNOSIS — Y92009 Unspecified place in unspecified non-institutional (private) residence as the place of occurrence of the external cause: Secondary | ICD-10-CM | POA: Diagnosis not present

## 2021-08-04 DIAGNOSIS — Z809 Family history of malignant neoplasm, unspecified: Secondary | ICD-10-CM | POA: Diagnosis not present

## 2021-08-04 DIAGNOSIS — E1165 Type 2 diabetes mellitus with hyperglycemia: Secondary | ICD-10-CM | POA: Diagnosis not present

## 2021-08-04 DIAGNOSIS — R059 Cough, unspecified: Secondary | ICD-10-CM | POA: Diagnosis not present

## 2021-08-04 DIAGNOSIS — K219 Gastro-esophageal reflux disease without esophagitis: Secondary | ICD-10-CM | POA: Diagnosis present

## 2021-08-04 DIAGNOSIS — R296 Repeated falls: Secondary | ICD-10-CM | POA: Diagnosis present

## 2021-08-04 DIAGNOSIS — F17210 Nicotine dependence, cigarettes, uncomplicated: Secondary | ICD-10-CM | POA: Diagnosis present

## 2021-08-04 DIAGNOSIS — Z20822 Contact with and (suspected) exposure to covid-19: Secondary | ICD-10-CM | POA: Diagnosis present

## 2021-08-04 DIAGNOSIS — S065X0A Traumatic subdural hemorrhage without loss of consciousness, initial encounter: Secondary | ICD-10-CM | POA: Diagnosis not present

## 2021-08-04 DIAGNOSIS — Z923 Personal history of irradiation: Secondary | ICD-10-CM | POA: Diagnosis not present

## 2021-08-04 DIAGNOSIS — I495 Sick sinus syndrome: Secondary | ICD-10-CM | POA: Diagnosis present

## 2021-08-04 DIAGNOSIS — J449 Chronic obstructive pulmonary disease, unspecified: Secondary | ICD-10-CM | POA: Diagnosis present

## 2021-08-04 DIAGNOSIS — R55 Syncope and collapse: Secondary | ICD-10-CM | POA: Diagnosis not present

## 2021-08-04 DIAGNOSIS — S065X9A Traumatic subdural hemorrhage with loss of consciousness of unspecified duration, initial encounter: Secondary | ICD-10-CM | POA: Diagnosis present

## 2021-08-04 DIAGNOSIS — J9601 Acute respiratory failure with hypoxia: Secondary | ICD-10-CM | POA: Diagnosis present

## 2021-08-04 DIAGNOSIS — Z833 Family history of diabetes mellitus: Secondary | ICD-10-CM | POA: Diagnosis not present

## 2021-08-04 DIAGNOSIS — E785 Hyperlipidemia, unspecified: Secondary | ICD-10-CM | POA: Diagnosis present

## 2021-08-04 DIAGNOSIS — Y92012 Bathroom of single-family (private) house as the place of occurrence of the external cause: Secondary | ICD-10-CM | POA: Diagnosis not present

## 2021-08-04 DIAGNOSIS — Z7984 Long term (current) use of oral hypoglycemic drugs: Secondary | ICD-10-CM | POA: Diagnosis not present

## 2021-08-04 DIAGNOSIS — S50311A Abrasion of right elbow, initial encounter: Secondary | ICD-10-CM | POA: Diagnosis present

## 2021-08-04 DIAGNOSIS — Z853 Personal history of malignant neoplasm of breast: Secondary | ICD-10-CM | POA: Diagnosis not present

## 2021-08-04 DIAGNOSIS — G936 Cerebral edema: Secondary | ICD-10-CM | POA: Diagnosis present

## 2021-08-04 DIAGNOSIS — W19XXXA Unspecified fall, initial encounter: Secondary | ICD-10-CM

## 2021-08-04 DIAGNOSIS — E876 Hypokalemia: Secondary | ICD-10-CM | POA: Diagnosis not present

## 2021-08-04 DIAGNOSIS — D649 Anemia, unspecified: Secondary | ICD-10-CM | POA: Diagnosis present

## 2021-08-04 DIAGNOSIS — G9340 Encephalopathy, unspecified: Secondary | ICD-10-CM | POA: Diagnosis present

## 2021-08-04 DIAGNOSIS — Z86011 Personal history of benign neoplasm of the brain: Secondary | ICD-10-CM | POA: Diagnosis not present

## 2021-08-04 DIAGNOSIS — Z8249 Family history of ischemic heart disease and other diseases of the circulatory system: Secondary | ICD-10-CM | POA: Diagnosis not present

## 2021-08-04 HISTORY — PX: CRANIOTOMY: SHX93

## 2021-08-04 LAB — ABO/RH: ABO/RH(D): AB POS

## 2021-08-04 LAB — POCT I-STAT 7, (LYTES, BLD GAS, ICA,H+H)
Acid-Base Excess: 1 mmol/L (ref 0.0–2.0)
Bicarbonate: 23.8 mmol/L (ref 20.0–28.0)
Calcium, Ion: 1.17 mmol/L (ref 1.15–1.40)
HCT: 33 % — ABNORMAL LOW (ref 36.0–46.0)
Hemoglobin: 11.2 g/dL — ABNORMAL LOW (ref 12.0–15.0)
O2 Saturation: 99 %
Potassium: 3.7 mmol/L (ref 3.5–5.1)
Sodium: 139 mmol/L (ref 135–145)
TCO2: 25 mmol/L (ref 22–32)
pCO2 arterial: 32.9 mmHg (ref 32.0–48.0)
pH, Arterial: 7.468 — ABNORMAL HIGH (ref 7.350–7.450)
pO2, Arterial: 125 mmHg — ABNORMAL HIGH (ref 83.0–108.0)

## 2021-08-04 LAB — HEMOGLOBIN A1C
Hgb A1c MFr Bld: 6.2 % — ABNORMAL HIGH (ref 4.8–5.6)
Mean Plasma Glucose: 131 mg/dL

## 2021-08-04 LAB — CBC
HCT: 36.1 % (ref 36.0–46.0)
Hemoglobin: 11.8 g/dL — ABNORMAL LOW (ref 12.0–15.0)
MCH: 29.8 pg (ref 26.0–34.0)
MCHC: 32.7 g/dL (ref 30.0–36.0)
MCV: 91.2 fL (ref 80.0–100.0)
Platelets: 290 10*3/uL (ref 150–400)
RBC: 3.96 MIL/uL (ref 3.87–5.11)
RDW: 14.6 % (ref 11.5–15.5)
WBC: 8.8 10*3/uL (ref 4.0–10.5)
nRBC: 0 % (ref 0.0–0.2)

## 2021-08-04 LAB — BASIC METABOLIC PANEL
Anion gap: 10 (ref 5–15)
BUN: 8 mg/dL (ref 8–23)
CO2: 25 mmol/L (ref 22–32)
Calcium: 8.8 mg/dL — ABNORMAL LOW (ref 8.9–10.3)
Chloride: 103 mmol/L (ref 98–111)
Creatinine, Ser: 0.64 mg/dL (ref 0.44–1.00)
GFR, Estimated: >60 mL/min (ref >60)
Glucose, Bld: 129 mg/dL — ABNORMAL HIGH (ref 70–99)
Potassium: 3.4 mmol/L — ABNORMAL LOW (ref 3.5–5.1)
Sodium: 138 mmol/L (ref 135–145)

## 2021-08-04 LAB — CBG MONITORING, ED
Glucose-Capillary: 110 mg/dL — ABNORMAL HIGH (ref 70–99)
Glucose-Capillary: 125 mg/dL — ABNORMAL HIGH (ref 70–99)
Glucose-Capillary: 135 mg/dL — ABNORMAL HIGH (ref 70–99)
Glucose-Capillary: 66 mg/dL — ABNORMAL LOW (ref 70–99)

## 2021-08-04 LAB — GLUCOSE, CAPILLARY: Glucose-Capillary: 136 mg/dL — ABNORMAL HIGH (ref 70–99)

## 2021-08-04 LAB — TYPE AND SCREEN
ABO/RH(D): AB POS
Antibody Screen: NEGATIVE

## 2021-08-04 LAB — MRSA NEXT GEN BY PCR, NASAL: MRSA by PCR Next Gen: NOT DETECTED

## 2021-08-04 SURGERY — CRANIOTOMY HEMATOMA EVACUATION SUBDURAL
Anesthesia: General | Laterality: Right

## 2021-08-04 MED ORDER — HEPARIN SODIUM (PORCINE) 5000 UNIT/ML IJ SOLN
5000.0000 [IU] | Freq: Three times a day (TID) | INTRAMUSCULAR | Status: DC
  Administered 2021-08-06 – 2021-08-10 (×13): 5000 [IU] via SUBCUTANEOUS
  Filled 2021-08-04 (×13): qty 1

## 2021-08-04 MED ORDER — PHENYLEPHRINE 40 MCG/ML (10ML) SYRINGE FOR IV PUSH (FOR BLOOD PRESSURE SUPPORT)
PREFILLED_SYRINGE | INTRAVENOUS | Status: DC | PRN
  Administered 2021-08-04: 80 ug via INTRAVENOUS

## 2021-08-04 MED ORDER — ACETAMINOPHEN 325 MG PO TABS
650.0000 mg | ORAL_TABLET | ORAL | Status: DC | PRN
  Filled 2021-08-04: qty 2

## 2021-08-04 MED ORDER — ACETAMINOPHEN 325 MG PO TABS: 650.0000 mg | ORAL_TABLET | ORAL | Status: DC | PRN

## 2021-08-04 MED ORDER — BACITRACIN ZINC 500 UNIT/GM EX OINT
TOPICAL_OINTMENT | CUTANEOUS | Status: DC | PRN
  Administered 2021-08-04: 1 via TOPICAL

## 2021-08-04 MED ORDER — LABETALOL HCL 5 MG/ML IV SOLN
10.0000 mg | INTRAVENOUS | Status: DC | PRN
Start: 2021-08-04 — End: 2021-08-06

## 2021-08-04 MED ORDER — CEFAZOLIN SODIUM-DEXTROSE 2-4 GM/100ML-% IV SOLN
2.0000 g | Freq: Three times a day (TID) | INTRAVENOUS | Status: AC
  Administered 2021-08-04 – 2021-08-05 (×2): 2 g via INTRAVENOUS
  Filled 2021-08-04 (×3): qty 100

## 2021-08-04 MED ORDER — ALBUTEROL SULFATE (2.5 MG/3ML) 0.083% IN NEBU: 2.5000 mg | INHALATION_SOLUTION | RESPIRATORY_TRACT | Status: DC | PRN

## 2021-08-04 MED ORDER — POTASSIUM CHLORIDE IN NACL 20-0.9 MEQ/L-% IV SOLN
INTRAVENOUS | Status: DC
  Filled 2021-08-04: qty 1000

## 2021-08-04 MED ORDER — ORAL CARE MOUTH RINSE: 15.0000 mL | Freq: Once | OROMUCOSAL | Status: DC

## 2021-08-04 MED ORDER — DOCUSATE SODIUM 50 MG/5ML PO LIQD: 100.0000 mg | Freq: Two times a day (BID) | ORAL | Status: DC

## 2021-08-04 MED ORDER — POLYETHYLENE GLYCOL 3350 17 G PO PACK
17.0000 g | PACK | Freq: Every day | ORAL | Status: DC | PRN
  Administered 2021-08-05: 17 g via ORAL
  Filled 2021-08-04: qty 1

## 2021-08-04 MED ORDER — ONDANSETRON HCL 4 MG PO TABS: 4.0000 mg | ORAL_TABLET | ORAL | Status: DC | PRN

## 2021-08-04 MED ORDER — PROPOFOL 1000 MG/100ML IV EMUL
0.0000 ug/kg/min | INTRAVENOUS | Status: DC
  Administered 2021-08-04: 40 ug/kg/min via INTRAVENOUS

## 2021-08-04 MED ORDER — DEXAMETHASONE SODIUM PHOSPHATE 10 MG/ML IJ SOLN
INTRAMUSCULAR | Status: AC
  Filled 2021-08-04: qty 1

## 2021-08-04 MED ORDER — PANTOPRAZOLE SODIUM 40 MG PO TBEC: 40.0000 mg | DELAYED_RELEASE_TABLET | Freq: Every day | ORAL | Status: DC | PRN

## 2021-08-04 MED ORDER — FENTANYL CITRATE (PF) 250 MCG/5ML IJ SOLN
INTRAMUSCULAR | Status: AC
  Filled 2021-08-04: qty 5

## 2021-08-04 MED ORDER — THROMBIN 5000 UNITS EX SOLR
CUTANEOUS | Status: AC
  Filled 2021-08-04: qty 5000

## 2021-08-04 MED ORDER — FENTANYL BOLUS VIA INFUSION
25.0000 ug | INTRAVENOUS | Status: DC | PRN
  Administered 2021-08-04: 50 ug via INTRAVENOUS
  Administered 2021-08-04: 25 ug via INTRAVENOUS
  Filled 2021-08-04: qty 100

## 2021-08-04 MED ORDER — CLONAZEPAM 1 MG PO TABS
1.0000 mg | ORAL_TABLET | Freq: Three times a day (TID) | ORAL | Status: DC | PRN
  Administered 2021-08-06 – 2021-08-10 (×11): 1 mg via ORAL
  Filled 2021-08-04 (×12): qty 1

## 2021-08-04 MED ORDER — CHLORHEXIDINE GLUCONATE CLOTH 2 % EX PADS
6.0000 | MEDICATED_PAD | Freq: Every day | CUTANEOUS | Status: DC
  Administered 2021-08-05 – 2021-08-10 (×6): 6 via TOPICAL

## 2021-08-04 MED ORDER — ORAL CARE MOUTH RINSE
15.0000 mL | OROMUCOSAL | Status: DC
  Administered 2021-08-04 – 2021-08-05 (×8): 15 mL via OROMUCOSAL

## 2021-08-04 MED ORDER — HYDROMORPHONE HCL 1 MG/ML IJ SOLN: 0.5000 mg | INTRAMUSCULAR | Status: DC | PRN

## 2021-08-04 MED ORDER — PROMETHAZINE HCL 25 MG PO TABS
12.5000 mg | ORAL_TABLET | ORAL | Status: DC | PRN
  Filled 2021-08-04: qty 1

## 2021-08-04 MED ORDER — DEXMEDETOMIDINE HCL IN NACL 400 MCG/100ML IV SOLN
0.4000 ug/kg/h | INTRAVENOUS | Status: DC
  Administered 2021-08-04: 0.4 ug/kg/h via INTRAVENOUS
  Administered 2021-08-04: 0.8 ug/kg/h via INTRAVENOUS
  Administered 2021-08-04 – 2021-08-05 (×4): 1.2 ug/kg/h via INTRAVENOUS
  Filled 2021-08-04 (×5): qty 100

## 2021-08-04 MED ORDER — HYDROCODONE-ACETAMINOPHEN 5-325 MG PO TABS
1.0000 | ORAL_TABLET | ORAL | Status: DC | PRN
Start: 2021-08-04 — End: 2021-08-04

## 2021-08-04 MED ORDER — HYDRALAZINE HCL 20 MG/ML IJ SOLN
10.0000 mg | INTRAMUSCULAR | Status: DC | PRN
  Administered 2021-08-04 – 2021-08-09 (×6): 10 mg via INTRAVENOUS
  Filled 2021-08-04 (×6): qty 1

## 2021-08-04 MED ORDER — SODIUM CHLORIDE 0.9 % IV SOLN
250.0000 mL | INTRAVENOUS | Status: DC
  Administered 2021-08-04: 250 mL via INTRAVENOUS

## 2021-08-04 MED ORDER — SODIUM CHLORIDE 0.9 % IV SOLN: INTRAVENOUS | Status: DC | PRN

## 2021-08-04 MED ORDER — LIDOCAINE-EPINEPHRINE 1 %-1:100000 IJ SOLN
INTRAMUSCULAR | Status: AC
  Filled 2021-08-04: qty 1

## 2021-08-04 MED ORDER — CHLORHEXIDINE GLUCONATE 0.12 % MT SOLN: 15.0000 mL | Freq: Once | OROMUCOSAL | Status: DC

## 2021-08-04 MED ORDER — METFORMIN HCL 500 MG PO TABS: 500.0000 mg | ORAL_TABLET | Freq: Every day | ORAL | Status: DC

## 2021-08-04 MED ORDER — CEFAZOLIN SODIUM-DEXTROSE 2-4 GM/100ML-% IV SOLN
INTRAVENOUS | Status: AC
  Filled 2021-08-04: qty 100

## 2021-08-04 MED ORDER — FENTANYL CITRATE (PF) 250 MCG/5ML IJ SOLN
INTRAMUSCULAR | Status: DC | PRN
  Administered 2021-08-04: 100 ug via INTRAVENOUS

## 2021-08-04 MED ORDER — FENTANYL CITRATE PF 50 MCG/ML IJ SOSY
25.0000 ug | PREFILLED_SYRINGE | Freq: Once | INTRAMUSCULAR | Status: DC
  Filled 2021-08-04: qty 1

## 2021-08-04 MED ORDER — DEXAMETHASONE SODIUM PHOSPHATE 10 MG/ML IJ SOLN
INTRAMUSCULAR | Status: DC | PRN
  Administered 2021-08-04: 5 mg via INTRAVENOUS

## 2021-08-04 MED ORDER — POTASSIUM CHLORIDE 20 MEQ PO PACK
20.0000 meq | PACK | Freq: Once | ORAL | Status: AC
  Administered 2021-08-04: 20 meq
  Filled 2021-08-04: qty 1

## 2021-08-04 MED ORDER — CHLORHEXIDINE GLUCONATE 0.12% ORAL RINSE (MEDLINE KIT)
15.0000 mL | Freq: Two times a day (BID) | OROMUCOSAL | Status: DC
  Administered 2021-08-04 – 2021-08-05 (×3): 15 mL via OROMUCOSAL

## 2021-08-04 MED ORDER — CEFAZOLIN SODIUM-DEXTROSE 2-3 GM-%(50ML) IV SOLR
INTRAVENOUS | Status: DC | PRN
  Administered 2021-08-04: 2 g via INTRAVENOUS

## 2021-08-04 MED ORDER — LIDOCAINE 2% (20 MG/ML) 5 ML SYRINGE
INTRAMUSCULAR | Status: DC | PRN
  Administered 2021-08-04: 100 mg via INTRAVENOUS

## 2021-08-04 MED ORDER — ONDANSETRON HCL 4 MG/2ML IJ SOLN: 4.0000 mg | INTRAMUSCULAR | Status: DC | PRN

## 2021-08-04 MED ORDER — THROMBIN 5000 UNITS EX SOLR
OROMUCOSAL | Status: DC | PRN
  Administered 2021-08-04: 5 mL via TOPICAL

## 2021-08-04 MED ORDER — LACTATED RINGERS IV SOLN: INTRAVENOUS | Status: DC

## 2021-08-04 MED ORDER — ROCURONIUM BROMIDE 10 MG/ML (PF) SYRINGE
PREFILLED_SYRINGE | INTRAVENOUS | Status: AC
  Filled 2021-08-04: qty 10

## 2021-08-04 MED ORDER — DEXTROSE 50 % IV SOLN
INTRAVENOUS | Status: AC
  Filled 2021-08-04: qty 50

## 2021-08-04 MED ORDER — BACITRACIN ZINC 500 UNIT/GM EX OINT
TOPICAL_OINTMENT | CUTANEOUS | Status: AC
  Filled 2021-08-04: qty 28.35

## 2021-08-04 MED ORDER — HYDROCODONE-ACETAMINOPHEN 5-325 MG PO TABS: 1.0000 | ORAL_TABLET | ORAL | Status: DC | PRN

## 2021-08-04 MED ORDER — LOSARTAN POTASSIUM 50 MG PO TABS: 50.0000 mg | ORAL_TABLET | Freq: Every day | ORAL | Status: DC

## 2021-08-04 MED ORDER — ONDANSETRON HCL 4 MG/2ML IJ SOLN
INTRAMUSCULAR | Status: AC
  Filled 2021-08-04: qty 2

## 2021-08-04 MED ORDER — SODIUM CHLORIDE 0.9 % IV SOLN: INTRAVENOUS | Status: DC

## 2021-08-04 MED ORDER — 0.9 % SODIUM CHLORIDE (POUR BTL) OPTIME
TOPICAL | Status: DC | PRN
  Administered 2021-08-04: 3000 mL

## 2021-08-04 MED ORDER — LABETALOL HCL 5 MG/ML IV SOLN
10.0000 mg | Freq: Once | INTRAVENOUS | Status: AC
  Administered 2021-08-04: 10 mg via INTRAVENOUS
  Filled 2021-08-04: qty 4

## 2021-08-04 MED ORDER — DOCUSATE SODIUM 100 MG PO CAPS: 100.0000 mg | ORAL_CAPSULE | Freq: Two times a day (BID) | ORAL | Status: DC

## 2021-08-04 MED ORDER — PROPOFOL 10 MG/ML IV BOLUS
INTRAVENOUS | Status: DC | PRN
  Administered 2021-08-04: 80 mg via INTRAVENOUS
  Administered 2021-08-04 (×2): 50 mg via INTRAVENOUS

## 2021-08-04 MED ORDER — PHENYLEPHRINE HCL-NACL 20-0.9 MG/250ML-% IV SOLN
INTRAVENOUS | Status: DC | PRN
Start: 2021-08-04 — End: 2021-08-04
  Administered 2021-08-04: 40 ug/min via INTRAVENOUS

## 2021-08-04 MED ORDER — LABETALOL HCL 5 MG/ML IV SOLN: 20.0000 mg | INTRAVENOUS | Status: DC | PRN

## 2021-08-04 MED ORDER — PANTOPRAZOLE SODIUM 40 MG IV SOLR
40.0000 mg | Freq: Every day | INTRAVENOUS | Status: DC
Start: 2021-08-04 — End: 2021-08-06
  Administered 2021-08-04 – 2021-08-05 (×2): 40 mg via INTRAVENOUS
  Filled 2021-08-04 (×2): qty 40

## 2021-08-04 MED ORDER — METFORMIN HCL 500 MG PO TABS
500.0000 mg | ORAL_TABLET | Freq: Every day | ORAL | Status: DC
  Administered 2021-08-04: 500 mg
  Filled 2021-08-04: qty 1

## 2021-08-04 MED ORDER — FENTANYL 2500MCG IN NS 250ML (10MCG/ML) PREMIX INFUSION
25.0000 ug/h | INTRAVENOUS | Status: DC
  Administered 2021-08-04 (×2): 25 ug/h via INTRAVENOUS
  Filled 2021-08-04: qty 250

## 2021-08-04 MED ORDER — LIDOCAINE-EPINEPHRINE 1 %-1:100000 IJ SOLN
INTRAMUSCULAR | Status: DC | PRN
  Administered 2021-08-04: 7 mL

## 2021-08-04 MED ORDER — BUPROPION HCL ER (SR) 150 MG PO TB12
150.0000 mg | ORAL_TABLET | Freq: Every day | ORAL | Status: DC
  Administered 2021-08-05 – 2021-08-10 (×6): 150 mg via ORAL
  Filled 2021-08-04 (×7): qty 1

## 2021-08-04 MED ORDER — PROPOFOL 500 MG/50ML IV EMUL
INTRAVENOUS | Status: DC | PRN
  Administered 2021-08-04: 40 ug/kg/min via INTRAVENOUS

## 2021-08-04 MED ORDER — LEVETIRACETAM IN NACL 500 MG/100ML IV SOLN
500.0000 mg | Freq: Two times a day (BID) | INTRAVENOUS | Status: DC
  Administered 2021-08-04 – 2021-08-06 (×4): 500 mg via INTRAVENOUS
  Filled 2021-08-04 (×4): qty 100

## 2021-08-04 MED ORDER — ROCURONIUM BROMIDE 10 MG/ML (PF) SYRINGE
PREFILLED_SYRINGE | INTRAVENOUS | Status: DC | PRN
  Administered 2021-08-04: 70 mg via INTRAVENOUS

## 2021-08-04 MED ORDER — ACETAMINOPHEN 650 MG RE SUPP: 650.0000 mg | RECTAL | Status: DC | PRN

## 2021-08-04 MED ORDER — POLYETHYLENE GLYCOL 3350 17 G PO PACK: 17.0000 g | PACK | Freq: Every day | ORAL | Status: DC

## 2021-08-04 MED ORDER — INSULIN ASPART 100 UNIT/ML IJ SOLN
0.0000 [IU] | INTRAMUSCULAR | Status: DC
  Administered 2021-08-04: 1 [IU] via SUBCUTANEOUS

## 2021-08-04 MED ORDER — NOREPINEPHRINE 4 MG/250ML-% IV SOLN
2.0000 ug/min | INTRAVENOUS | Status: DC
  Administered 2021-08-04: 2 ug/min via INTRAVENOUS
  Filled 2021-08-04: qty 250

## 2021-08-04 MED ORDER — CHLORHEXIDINE GLUCONATE 0.12 % MT SOLN
OROMUCOSAL | Status: AC
  Filled 2021-08-04: qty 15

## 2021-08-04 SURGICAL SUPPLY — 60 items
BAG COUNTER SPONGE SURGICOUNT (BAG) ×3 IMPLANT
BLADE CLIPPER SURG (BLADE) ×2 IMPLANT
BNDG GAUZE ELAST 4 BULKY (GAUZE/BANDAGES/DRESSINGS) IMPLANT
BUR ACORN 9.0 PRECISION (BURR) ×2 IMPLANT
BUR MATCHSTICK NEURO 3.0 LAGG (BURR) IMPLANT
BUR SPIRAL ROUTER 2.3 (BUR) IMPLANT
CANISTER SUCT 3000ML PPV (MISCELLANEOUS) ×2 IMPLANT
CLIP VESOCCLUDE MED 6/CT (CLIP) IMPLANT
DRAPE NEUROLOGICAL W/INCISE (DRAPES) ×2 IMPLANT
DRAPE SHEET LG 3/4 BI-LAMINATE (DRAPES) ×2 IMPLANT
DRAPE SURG 17X23 STRL (DRAPES) IMPLANT
DRAPE WARM FLUID 44X44 (DRAPES) ×2 IMPLANT
DURAPREP 6ML APPLICATOR 50/CS (WOUND CARE) ×2 IMPLANT
ELECT REM PT RETURN 9FT ADLT (ELECTROSURGICAL) ×2
ELECTRODE REM PT RTRN 9FT ADLT (ELECTROSURGICAL) ×1 IMPLANT
EVACUATOR 1/8 PVC DRAIN (DRAIN) IMPLANT
EVACUATOR SILICONE 100CC (DRAIN) IMPLANT
GAUZE 4X4 16PLY ~~LOC~~+RFID DBL (SPONGE) ×2 IMPLANT
GAUZE SPONGE 4X4 12PLY STRL (GAUZE/BANDAGES/DRESSINGS) ×2 IMPLANT
GLOVE EXAM NITRILE LRG STRL (GLOVE) IMPLANT
GLOVE EXAM NITRILE XL STR (GLOVE) IMPLANT
GLOVE EXAM NITRILE XS STR PU (GLOVE) IMPLANT
GLOVE SURG LTX SZ7.5 (GLOVE) ×4 IMPLANT
GLOVE SURG UNDER POLY LF SZ7.5 (GLOVE) ×4 IMPLANT
GOWN STRL REUS W/ TWL LRG LVL3 (GOWN DISPOSABLE) ×2 IMPLANT
GOWN STRL REUS W/ TWL XL LVL3 (GOWN DISPOSABLE) IMPLANT
GOWN STRL REUS W/TWL 2XL LVL3 (GOWN DISPOSABLE) IMPLANT
GOWN STRL REUS W/TWL LRG LVL3 (GOWN DISPOSABLE) ×4
GOWN STRL REUS W/TWL XL LVL3 (GOWN DISPOSABLE)
HEMOSTAT POWDER KIT SURGIFOAM (HEMOSTASIS) ×2 IMPLANT
HEMOSTAT SURGICEL 2X14 (HEMOSTASIS) IMPLANT
HOOK DURA 1/2IN (MISCELLANEOUS) ×1 IMPLANT
KIT BASIN OR (CUSTOM PROCEDURE TRAY) ×2 IMPLANT
KIT TURNOVER KIT B (KITS) ×2 IMPLANT
NEEDLE HYPO 22GX1.5 SAFETY (NEEDLE) ×2 IMPLANT
NS IRRIG 1000ML POUR BTL (IV SOLUTION) ×4 IMPLANT
PACK CRANIOTOMY CUSTOM (CUSTOM PROCEDURE TRAY) ×2 IMPLANT
PATTIES SURGICAL .5 X.5 (GAUZE/BANDAGES/DRESSINGS) IMPLANT
PATTIES SURGICAL .5 X3 (DISPOSABLE) IMPLANT
PATTIES SURGICAL 1X1 (DISPOSABLE) IMPLANT
SPONGE NEURO XRAY DETECT 1X3 (DISPOSABLE) IMPLANT
SPONGE SURGIFOAM ABS GEL SZ50 (HEMOSTASIS) ×1 IMPLANT
STAPLER VISISTAT 35W (STAPLE) ×2 IMPLANT
STOCKINETTE 6  STRL (DRAPES)
STOCKINETTE 6 STRL (DRAPES) IMPLANT
SUT ETHILON 3 0 FSL (SUTURE) IMPLANT
SUT ETHILON 3 0 PS 1 (SUTURE) IMPLANT
SUT MNCRL AB 3-0 PS2 18 (SUTURE) ×2 IMPLANT
SUT NURALON 4 0 TR CR/8 (SUTURE) ×2 IMPLANT
SUT STEEL 0 (SUTURE)
SUT STEEL 0 18XMFL TIE 17 (SUTURE) IMPLANT
SUT VIC AB 0 CT1 18XCR BRD8 (SUTURE) ×1 IMPLANT
SUT VIC AB 0 CT1 8-18 (SUTURE) ×2
SUT VIC AB 2-0 CP2 18 (SUTURE) ×1 IMPLANT
TOWEL GREEN STERILE (TOWEL DISPOSABLE) ×2 IMPLANT
TOWEL GREEN STERILE FF (TOWEL DISPOSABLE) ×2 IMPLANT
TRAY FOLEY MTR SLVR 16FR STAT (SET/KITS/TRAYS/PACK) ×2 IMPLANT
TUBE CONNECTING 12X1/4 (SUCTIONS) ×2 IMPLANT
UNDERPAD 30X36 HEAVY ABSORB (UNDERPADS AND DIAPERS) ×2 IMPLANT
WATER STERILE IRR 1000ML POUR (IV SOLUTION) ×2 IMPLANT

## 2021-08-04 NOTE — ED Notes (Signed)
Patient transported to CT 

## 2021-08-04 NOTE — ED Notes (Signed)
Pt has developed a wet cough and is having a difficult time getting it up. Having to suction mouth. Provider sent a message and made aware

## 2021-08-04 NOTE — Transfer of Care (Signed)
Immediate Anesthesia Transfer of Care Note  Patient: [place]  Procedure(s) Performed: Trudee Kuster HOLES FOR SUBDURAL HEMATOMA (Right)  Patient Location: PACU  Anesthesia Type:General  Level of Consciousness: Patient remains intubated per anesthesia plan  Airway & Oxygen Therapy: Patient remains intubated per anesthesia plan and Patient placed on Ventilator (see vital sign flow sheet for setting)  Post-op Assessment: Report given to RN and Post -op Vital signs reviewed and stable  Post vital signs: Reviewed and stable  Last Vitals:  Vitals Value Taken Time  BP 172/101 08/04/21 1300  Temp    Pulse 74 08/04/21 1302  Resp 16 08/04/21 1303  SpO2 100 % 08/04/21 1302  Vitals shown include unvalidated device data.  Last Pain:  Vitals:   08/04/21 0948  TempSrc: Axillary  PainSc:          Complications: No notable events documented.

## 2021-08-04 NOTE — Op Note (Signed)
PATIENT: Mary Higgins  DAY OF SURGERY: 08/04/21   PRE-OPERATIVE DIAGNOSIS:  Subdural hematoma   POST-OPERATIVE DIAGNOSIS:  Same   PROCEDURE:  Right burr hole evacuation of subdural hematoma, ligation of left VP shunt   SURGEON:  Surgeon(s) and Role:    Judith Part, MD - Primary   ANESTHESIA: ETGA   BRIEF HISTORY: This is an 80 year old woman who presented with headaches and then, while in the ED, had progressive obtundation. The patient was found to have a large right sided SDH. This was discussed with the patient's daughter as well as risks, benefits, and alternatives and wished to proceed with surgery.   OPERATIVE DETAIL: The patient was taken to the operating room and placed on the OR table in the supine position. A formal time out was performed with two patient identifiers and confirmed the operative site. Anesthesia was induced by the anesthesia team. The operative site was marked on the right side and over her shunt tubing on the left side, hair was clipped with surgical clippers, the area was then prepped and draped in a sterile fashion. I then tapped her shunt valve in a sterile fashion to make sure it was functioning, which it surprisingly was functioning.   A linear incision was placed in the right side of the scalp. A burr hole was created, dura coagulated and opened, and a large volume of subdural blood was evacuated then irrigated until clear. The brain was still fairly sunken, given that the shunt was working on shunt tap, I decided to proceed with shunt ligation.   The left sided incision was opened, shunt tubing was identified and ligated with suture. Both incisions were copiously irrigated and hemostasis was confirmed.  All instrument and sponge counts were correct, the incision was then closed in layers. The patient was then returned to anesthesia for emergence. No apparent complications at the completion of the procedure.   EBL:  62mL   DRAINS: none    SPECIMENS: none   Judith Part, MD 08/04/21 10:48 AM

## 2021-08-04 NOTE — Anesthesia Preprocedure Evaluation (Addendum)
Anesthesia Evaluation  Patient identified by MRN, date of birth, ID band Patient unresponsive    Reviewed: Allergy & Precautions, NPO status , Patient's Chart, lab work & pertinent test results  History of Anesthesia Complications Negative for: history of anesthetic complications  Airway Mallampati: II       Dental  (+) Upper Dentures, Dental Advisory Given   Pulmonary COPD, Current Smoker,    Pulmonary exam normal        Cardiovascular hypertension,  Rhythm:Regular Rate:Normal + Systolic murmurs    Neuro/Psych PSYCHIATRIC DISORDERS Anxiety Depression    GI/Hepatic negative GI ROS, Neg liver ROS,   Endo/Other  diabetes  Renal/GU negative Renal ROS     Musculoskeletal negative musculoskeletal ROS (+)   Abdominal   Peds  Hematology negative hematology ROS (+)   Anesthesia Other Findings   Reproductive/Obstetrics                            Anesthesia Physical Anesthesia Plan  ASA: 4 and emergent  Anesthesia Plan: General   Post-op Pain Management:    Induction: Intravenous  PONV Risk Score and Plan:   Airway Management Planned: Oral ETT  Additional Equipment: Arterial line  Intra-op Plan:   Post-operative Plan: Post-operative intubation/ventilation  Informed Consent:     Consent reviewed with POA and Dental advisory given  Plan Discussed with: Anesthesiologist and CRNA  Anesthesia Plan Comments:         Anesthesia Quick Evaluation

## 2021-08-04 NOTE — Anesthesia Procedure Notes (Signed)
Arterial Line Insertion Start/End9/22/2022 10:20 PM, 08/04/2021 10:30 PM Performed by: Albertha Ghee, MD, Griffin Dakin, CRNA, CRNA  Patient location: Pre-op. Preanesthetic checklist: patient identified, IV checked, site marked, risks and benefits discussed, surgical consent, monitors and equipment checked, pre-op evaluation, timeout performed and anesthesia consent Lidocaine 1% used for infiltration Left, radial was placed Catheter size: 20 G Hand hygiene performed  and maximum sterile barriers used   Attempts: 1 Procedure performed without using ultrasound guided technique. Following insertion, dressing applied and Biopatch. Post procedure assessment: normal and unchanged

## 2021-08-04 NOTE — ED Notes (Signed)
CBG from right hand 66, CBG from left hand 135. CBG of 135 is consistent with result from recent metabolic panel drawn at 9458.

## 2021-08-04 NOTE — Progress Notes (Addendum)
[facility] PROGRESS NOTE    Progress Note  [place]  BWL:[id number] DOB: Sep 09, 1941 DOA: 08/03/2021 PCP: [name], NP     Brief Narrative:   Mary Higgins is an 80 y.o. female past medical history significant for intracerebral hemorrhage in 2003 with coiling of the anterior communicating artery, VP shunt placement, pacemaker, essential hypertension and a remote history of breast cancer treated with lumpectomy and radiation, mild COPD not on oxygen who developed a fall loss of consciousness on 08/04/2019 2 in the afternoon, there is no loss of consciousness, but after the fall she was more sleepy, then her son found her unconscious came into the ED a CT scan of the head showed an acute hemorrhagic subdural hematoma with mass-effect about 9 mm there is significant lateral ventricles decreased size..   Significant studies: 08/03/2021: CT scan of the head with midline shift 9 mm Antibiotics: None  Microbiology data: Blood culture:  Procedures: None  Assessment/Plan:   Subdural hematoma (HCC) with midline shift: With a Glasco, score of 12, repeated CT scan showed worsening mass-effect to the right cerebral hemisphere about 1.1 cm Neurosurgery was consulted, who recommended surgical intervention on 08/04/2021 she is on no antiplatelet therapy. N.p.o. monitor blood pressure closely. Surgery to assume care as primary we will continue to follow and assist with diabetes mellitus management which seems to be well controlled.    Syncope and collapse: She never lost consciousness and she was then found by her son unconscious in the bed. Is likely due to subdural hematoma.  Diabetes mellitus type 2 with hyperglycemia with insulin use: Hold oral hypoglycemic agents. She is currently n.p.o. continue sliding scale insulin CBGs every 4.  H/o meningioma: Outpatient follow-up.  History of cerebral aneurysm repair/VP shunt, Per neurosurgery.  Normocytic  anemia: Hemoglobin relatively stable follow CBC.  Hypokalemia: We will replete IV recheck tomorrow morning.  Tobacco abuse: Counseling.  Anxiety: Hold oral hypoglycemic agents he is currently NPO.  Solitary pulmonary nodule there is a 3 mm right pulmonary nodule: she will need a follow-up CT scan as an outpatient 3 to 6 months as she is a smoker. Tobacco abuse  Essential hypertension: Goal blood pressure should be less than 220 and no less than 150. Further management per neurosurgery.  New cough: She has remained afebrile and leukocytosis, chest x-ray showed no acute cardiopulmonary disease, she satting greater 90% on room air she is not tachypneic.  DVT prophylaxis: scd Family Communication:none Status is: Observation  The patient will require care spanning > 2 midnights and should be moved to inpatient because: Hemodynamically unstable  Dispo: The patient is from: Home              Anticipated d/c is to: Home              Patient currently is not medically stable to d/c.   Difficult to place patient No        Code Status:     Code Status Orders  (From admission, onward)           Start     Ordered   08/03/21 2323  Full code  Continuous        08/03/21 2326           Code Status History     This patient has a current code status but no historical code status.         IV Access:   Peripheral IV   Procedures and diagnostic  studies:   DG Chest 2 View  Result Date: 08/03/2021 CLINICAL DATA:  Unwitnessed fall this morning due to syncopal episode. EXAM: CHEST - 2 VIEW COMPARISON:  07/19/2021 FINDINGS: Cardiac pacemaker. Surgical clips in the axilla bilaterally. Catheter placed over the left mediastinum possibly a ventricular peritoneal shunt. Heart size and pulmonary vascularity are normal. Lungs are clear. No pleural effusions. No pneumothorax. Mediastinal contours appear intact. Calcification of the aorta. Old left rib fractures. IMPRESSION:  No active cardiopulmonary disease. Electronically Signed   By: Lucienne Capers M.D.   On: 08/03/2021 19:36   CT Head Wo Contrast  Result Date: 08/03/2021 CLINICAL DATA:  Headache, intracranial hemorrhage suspected Unwitnessed fall. EXAM: CT HEAD WITHOUT CONTRAST TECHNIQUE: Contiguous axial images were obtained from the base of the skull through the vertex without intravenous contrast. COMPARISON:  Most recent head CT 07/19/2021 FINDINGS: Brain: Large right holo hemispheric subdural collection. This has both acute hemorrhage components as well as low-density components and measures up to 2.3 cm, measured on series 5, image 35. There is mass effect on the subjacent brain parenchyma with 9 mm right to left midline shift. Decreased ventricular size from prior exam, particularly the right lateral ventricle. No subarachnoid, intraventricular, or parenchymal hemorrhagic component. Stable left parafalcine 13 mm calcified meningioma posteriorly. Stable positioning of ventriculoperitoneal shunt catheter from a left frontal approach. Vascular: Coiling in the region of the anterior communicating artery. Skull base atherosclerosis. No hyperdense vessel. Skull: No skull fracture. Sinuses/Orbits: No acute findings. Mild paranasal sinus mucosal thickening. No mastoid effusion. Bilateral cataract resection. Other: None. IMPRESSION: 1. Large right holo hemispheric mixed density subdural collection measures up to 2.3 cm, with acute hemorrhage as well as low-density components. There is mass effect on the subjacent brain parenchyma with 9 mm right to left midline shift. Findings are new from CT earlier this month. 2. Mass effect on the lateral ventricles with decreased ventricular size from prior exam, particularly the right lateral ventricle. 3. Stable positioning of ventriculoperitoneal shunt catheter from a left frontal approach. 4. Stable left parafalcine calcified meningioma. Critical Value/emergent results were called by  telephone at the time of interpretation on 08/03/2021 at 8:31 pm to provider HALEY SAGE , who verbally acknowledged these results. Electronically Signed   By: Keith Rake M.D.   On: 08/03/2021 20:35   CT Cervical Spine Wo Contrast  Result Date: 08/03/2021 CLINICAL DATA:  Recent fall with neck pain EXAM: CT CERVICAL SPINE WITHOUT CONTRAST TECHNIQUE: Multidetector CT imaging of the cervical spine was performed without intravenous contrast. Multiplanar CT image reconstructions were also generated. COMPARISON:  None. FINDINGS: Alignment: Within normal limits. Skull base and vertebrae: Cervical segments are well visualized. Vertebral body height is well maintained. No acute fracture or acute facet abnormality is noted. Multilevel facet hypertrophic changes are seen particularly on the right. The odontoid is within normal limits. Soft tissues and spinal canal: Surrounding soft tissue structures demonstrate significant vascular calcifications within the carotid arteries. Shunt catheter is noted in the left neck. Upper chest: Visualized lung apices show evidence of a tiny nodule within the right upper lobe measuring 3 mm. No other focal abnormality is noted. Other: None IMPRESSION: Multilevel degenerative change without acute bony abnormality. 3 mm right solid pulmonary nodule within the upper lobe. If patient is low risk for malignancy, no routine follow-up imaging is recommended; if patient is high risk for malignancy, a non-contrast Chest CT at 12 months is optional. If performed and the nodule is stable at 12 months, no further follow-up  is recommended. These guidelines do not apply to immunocompromised patients and patients with cancer. Follow up in patients with significant comorbidities as clinically warranted. For lung cancer screening, adhere to Lung-RADS guidelines. Reference: Radiology. 2017; 284(1):228-43. Electronically Signed   By: Inez Catalina M.D.   On: 08/03/2021 20:40     Medical Consultants:    None.   Subjective:    Mary Higgins nonverbal  Objective:    Vitals:   08/04/21 0400 08/04/21 0515 08/04/21 0615 08/04/21 0630  BP: (!) 149/96 (!) 180/97 (!) 176/101 (!) 175/99  Pulse: 92 97 85 90  Resp: 20 19 (!) 21 19  Temp:      TempSrc:      SpO2: 96% 97% 97% 97%   SpO2: 97 %   Intake/Output Summary (Last 24 hours) at 08/04/2021 5465 Last data filed at 08/04/2021 0028 Gross per 24 hour  Intake --  Output 400 ml  Net -400 ml   There were no vitals filed for this visit.  Exam: General exam: In no acute distress. Respiratory system: Good air movement and clear to auscultation. Cardiovascular system: S1 & S2 heard, RRR. No JVD Gastrointestinal system: Abdomen is nondistended, soft and nontender.  Central nervous system: She opens her eyes to verbal command but not spontaneous no verbal response and localizes pain Glasgow Coma Scale is 9. Extremities: No pedal edema. Skin: No rashes, lesions or ulcers   Data Reviewed:    Labs: Basic Metabolic Panel: Recent Labs  Lab 08/03/21 1821 08/04/21 0521  NA 138 138  K 3.9 3.4*  CL 104 103  CO2 25 25  GLUCOSE 126* 129*  BUN 12 8  CREATININE 0.76 0.64  CALCIUM 9.1 8.8*   GFR Estimated Creatinine Clearance: 62.7 mL/min (by C-G formula based on SCr of 0.64 mg/dL). Liver Function Tests: Recent Labs  Lab 08/03/21 1821  AST 14*  ALT 9  ALKPHOS 61  BILITOT 0.7  PROT 6.0*  ALBUMIN 3.4*   No results for input(s): LIPASE, AMYLASE in the last 168 hours. No results for input(s): AMMONIA in the last 168 hours. Coagulation profile Recent Labs  Lab 08/03/21 2233  INR 1.0   COVID-19 Labs  No results for input(s): DDIMER, FERRITIN, LDH, CRP in the last 72 hours.  Lab Results  Component Value Date   [place] NEGATIVE 08/03/2021    CBC: Recent Labs  Lab 08/03/21 1821 08/04/21 0521  WBC 12.3* 8.8  NEUTROABS 10.3*  --   HGB 11.7* 11.8*  HCT 35.3* 36.1  MCV 91.7 91.2  PLT 293 290    Cardiac Enzymes: No results for input(s): CKTOTAL, CKMB, CKMBINDEX, TROPONINI in the last 168 hours. BNP (last 3 results) No results for input(s): PROBNP in the last 8760 hours. CBG: Recent Labs  Lab 08/03/21 1840 08/04/21 0414  GLUCAP 119* 110*   D-Dimer: No results for input(s): DDIMER in the last 72 hours. Hgb A1c: No results for input(s): HGBA1C in the last 72 hours. Lipid Profile: Recent Labs    08/03/21 2022  CHOL 125  HDL 34*  LDLCALC 71  TRIG 98  CHOLHDL 3.7   Thyroid function studies: No results for input(s): TSH, T4TOTAL, T3FREE, THYROIDAB in the last 72 hours.  Invalid input(s): FREET3 Anemia work up: No results for input(s): VITAMINB12, FOLATE, FERRITIN, TIBC, IRON, RETICCTPCT in the last 72 hours. Sepsis Labs: Recent Labs  Lab 08/03/21 1821 08/04/21 0521  WBC 12.3* 8.8   Microbiology Recent Results (from the past 240 hour(s))  Resp Panel  by RT-PCR (Flu A&B, Covid) Nasopharyngeal Swab     Status: None   Collection Time: 08/03/21  9:32 PM   Specimen: Nasopharyngeal Swab; Nasopharyngeal(NP) swabs in vial transport medium  Result Value Ref Range Status   SARS Coronavirus 2 by RT PCR NEGATIVE NEGATIVE Final    Comment: (NOTE) SARS-CoV-2 target nucleic acids are NOT DETECTED.  The SARS-CoV-2 RNA is generally detectable in upper respiratory specimens during the acute phase of infection. The lowest concentration of SARS-CoV-2 viral copies this assay can detect is 138 copies/mL. A negative result does not preclude SARS-Cov-2 infection and should not be used as the sole basis for treatment or other patient management decisions. A negative result may occur with  improper specimen collection/handling, submission of specimen other than nasopharyngeal swab, presence of viral mutation(s) within the areas targeted by this assay, and inadequate number of viral copies(<138 copies/mL). A negative result must be combined with clinical observations, patient  history, and epidemiological information. The expected result is Negative.  Fact Sheet for Patients:  EntrepreneurPulse.com.au  Fact Sheet for Healthcare Providers:  IncredibleEmployment.be  This test is no t yet approved or cleared by the [place] FDA and  has been authorized for detection and/or diagnosis of SARS-CoV-2 by FDA under an Emergency Use Authorization (EUA). This EUA will remain  in effect (meaning this test can be used) for the duration of the COVID-19 declaration under Section 564(b)(1) of the Act, 21 U.S.C.section 360bbb-3(b)(1), unless the authorization is terminated  or revoked sooner.       Influenza A by PCR NEGATIVE NEGATIVE Final   Influenza B by PCR NEGATIVE NEGATIVE Final    Comment: (NOTE) The Xpert Xpress SARS-CoV-2/FLU/RSV plus assay is intended as an aid in the diagnosis of influenza from Nasopharyngeal swab specimens and should not be used as a sole basis for treatment. Nasal washings and aspirates are unacceptable for Xpert Xpress SARS-CoV-2/FLU/RSV testing.  Fact Sheet for Patients: EntrepreneurPulse.com.au  Fact Sheet for Healthcare Providers: IncredibleEmployment.be  This test is not yet approved or cleared by the [place] FDA and has been authorized for detection and/or diagnosis of SARS-CoV-2 by FDA under an Emergency Use Authorization (EUA). This EUA will remain in effect (meaning this test can be used) for the duration of the COVID-19 declaration under Section 564(b)(1) of the Act, 21 U.S.C. section 360bbb-3(b)(1), unless the authorization is terminated or revoked.  Performed at [facility], [place] [address] [place], [place] [address]      Medications:    insulin aspart  0-9 Units Subcutaneous Q4H   labetalol  10 mg Intravenous Once   losartan  50 mg Oral BID   pantoprazole (PROTONIX) IV  40 mg Intravenous QHS   Continuous Infusions:   lactated ringers 50 mL/hr at 08/04/21 0315      LOS: 0 days   Charlynne Cousins  [facility]  08/04/2021, 7:13 AM

## 2021-08-04 NOTE — Progress Notes (Signed)
   08/04/21 1530  Provider Notification  Provider Name/Title S. Chand MD  Date Provider Notified 08/04/21  Time Provider Notified 1535  Notification Type Call  Notification Reason Other (Comment) (MAP in 50s after SBP in 200s)  Provider response See new orders (precedex and levophed)  Date of Provider Response 08/04/21  Time of Provider Response 1535   Patient hypotensive, sedation paused; pt waking up and coughing SBP in 200s, propofol resumed and fentanyl started. Patient extremely restless, grabbing ETT; restraints applied.  Patient BP dropping, propofol titrated down, SBP/MAP dropping; propofol and fentanyl paused and MD Chand called. Orders for precedex and levophed placed.

## 2021-08-04 NOTE — ED Notes (Addendum)
Neurosurgeon at bedside °

## 2021-08-04 NOTE — ED Notes (Signed)
Provider page and spoke with provider. Advised to give a one time dose of labetalol 10mg  and prn order change also repeat chest x-ray

## 2021-08-04 NOTE — H&P (Signed)
Neurosurgery H&P  CC: Fall   HPI: This is a 80 y.o. woman that presents to the ED after a mechanical fall. Of note, she has a h/o prior ruptured Acomm aneurysm that was coiled (TD), required an EVD then VPS. From the notes, it looks like a left sided approach was used to go contralateral to her R F EVD. She also has a h/o cardiac issues including SSS s/p ICD. Medications notable for ASA81 but no other anticoagulants / antiplatelet agents. PMHx also notable for Fuchs' COPD, DM.    ROS: A 14 point ROS was performed and is negative except as noted in the HPI.    PMHx:      Past Medical History:  Diagnosis Date   Anxiety and depression      Hx of   Blepharospasm     Breast cancer ([place])      s/p lumpectomy   Carotid bruit      Bilateral. Mild to moderate disease on LEFT and mild on the RIGHT in 2012   COPD (chronic obstructive pulmonary disease) ([place])     Diabetes mellitus without complication (HCC)      AODM   Fuchs' corneal dystrophy     GERD (gastroesophageal reflux disease)     Hyperlipidemia     Hypertension     Insomnia     Intracerebral bleed ([place]) 2003    Remote intracerebral blled and coiling by Dr. Estanislado Pandy for cerebral aneurysm ans a shunt placed by Dr. Vertell Limber remotely. No seizure disorder and this happened in 2003 with an anterior communicating aneurysm.   Neurocardiogenic syncope      a. s/p Biotronik dual chamber pacemaker   Personal history of radiation therapy     Sinus node dysfunction (HCC)     Tobacco abuse     Type II or unspecified type diabetes mellitus without mention of complication, not stated as uncontrolled      FamHx:       Family History  Problem Relation Age of Onset   Cancer Mother     Heart attack Father     Heart attack Son          Heart Pump placed    Diabetes Son     Hypertension Other      SocHx:  reports that she has been smoking cigarettes. She started smoking about 2 years ago. She has never used smokeless tobacco. She reports that she  does not drink alcohol and does not use drugs.   Exam: Vital signs in last 24 hours: Temp:  [97.9 F (36.6 C)] 97.9 F (36.6 C) (09/21 1819) Pulse Rate:  [90-94] 94 (09/21 1955) Resp:  [19-20] 20 (09/21 1955) BP: (142-144)/(76-94) 144/94 (09/21 1955) SpO2:  [91 %-96 %] 91 % (09/21 1955) General: Lying in ED bed, appears acutely ill Head: Normocephalic HEENT: Neck supple Pulmonary: breathing room air comfortably, some mild accessory use, good increased work of breathing Cardiac: RRR Abdomen: S NT ND Extremities: Warm and well perfused x4 Neuro: Eyes closed but opens them to stimulation, PERRL, gaze conjugate, Oxzero with incomprehensible speech, will FC and show thumbs on both sides but slower on the left and appears grossly weaker on the left, w/d BLE to painful stim     Assessment and Plan: 80 y.o. woman s/p fall with headaches, has a L F VPS p ruptured Acomm without any known h/o revisions in 20y, likely non-functioning. [place] personally reviewed, which shows large R mixed density SDH with 59mm of MLS  with a few small areas of acute hemorrhage.    -OR this morning for evacuation of hematoma, will have to watch her airway preop to see if she requires intubation, current GCS [id number] = 11 but decreasing slowly -admit to my service 4N ICU post-op

## 2021-08-04 NOTE — ED Notes (Signed)
Returned from ct at this time ?

## 2021-08-04 NOTE — Progress Notes (Signed)
PT Cancellation Note  Patient Details Name: Mary Higgins MRN: [id number] DOB: 01/04/1941   Cancelled Treatment:    Reason Eval/Treat Not Completed: Patient at procedure or test/unavailable.  Having surgery today and PT to follow up at another time.   Ramond Dial 08/04/2021, 10:05 AM  Mee Hives, PT MS Acute Rehab Dept. Number: [place] and [place]

## 2021-08-04 NOTE — Progress Notes (Signed)
SLP Cancellation Note  Patient Details Name: Mary Higgins MRN: [id number] DOB: 26-Dec-1940   Cancelled treatment:       Reason Eval/Treat Not Completed: Patient at procedure or test/unavailable. Will have surgery today. Plan to f/u for eval   Aricela Bertagnolli, Katherene Ponto 08/04/2021, 8:43 AM

## 2021-08-04 NOTE — Anesthesia Procedure Notes (Signed)
Procedure Name: Intubation Date/Time: 08/04/2021 11:36 AM Performed by: Leonor Liv, CRNA Pre-anesthesia Checklist: Patient identified, Emergency Drugs available, Suction available and Patient being monitored Patient Re-evaluated:Patient Re-evaluated prior to induction Oxygen Delivery Method: Circle System Utilized Preoxygenation: Pre-oxygenation with 100% oxygen Induction Type: IV induction Ventilation: Mask ventilation without difficulty Laryngoscope Size: Mac and 3 Grade View: Grade I Tube type: Oral Tube size: 7.0 mm Number of attempts: 1 Airway Equipment and Method: Stylet and Oral airway Placement Confirmation: ETT inserted through vocal cords under direct vision, positive ETCO2 and breath sounds checked- equal and bilateral Secured at: 21 cm Tube secured with: Tape Dental Injury: Teeth and Oropharynx as per pre-operative assessment

## 2021-08-04 NOTE — Progress Notes (Signed)
[place] Progress Note Patient Name: CEIRA HOESCHEN DOB: November 28, 1940 MRN: [id number]   Date of Service  08/04/2021  HPI/Events of Note  Hypertension - BP - 174/86. Labetalol IV ordered, however, HR in 60's and nursing requests Hydralazine IV PRN.  eICU Interventions  Plan: Hydralazine 10 mg IV Q 4 hours PRN SBP > 160 or DBP > 105.     Intervention Category Major Interventions: Hypertension - evaluation and management  Markea Ruzich Cornelia Copa 08/04/2021, 9:35 PM

## 2021-08-04 NOTE — Consult Note (Signed)
NAME:  Mary Higgins, MRN:  [id number], DOB:  12/25/1940, LOS: 0 ADMISSION DATE:  08/03/2021, CONSULTATION DATE:  08/04/21 REFERRING MD:  Maurice Small - NSGY, CHIEF COMPLAINT:  respiratory failure    History of Present Illness:  80 yo F PMH SSS s/p ICD, ruptured Acomm aneurysm s/p coiling EVD and VPS (2003), DM, COPD presented to ED 9/21 following a mechanical fall. Found to have large R SDH, mixed density with 63mm midline shift.   Admitted to NSGY service. Taken to OR with NSGY 9/22 for evacuation of hematoma, as well as ligation of VPS. Following case, remains intubated.  CCM consulted in this setting    Pertinent  Medical History  SSS  s/p ICD  Ruptured Acomm aneurysm S/p VPS  COPD DM Fuch's corneal dystrophy GERD HTN HLD Breast cancer s/p lumpectomy  Significant Hospital Events: Including procedures, antibiotic start and stop dates in addition to other pertinent events   9/21 to ED following mechanical fall  9/22 CTH with large R SDH. OR with NSGY for evac, ligation of old VPS. Stayed intubated, PCCM consulted   Interim History / Subjective:  Remains intubated following case  Hypertensive when stimulated   Objective   Blood pressure (!) 142/74, pulse 83, temperature (!) 97.5 F (36.4 C), resp. rate 16, height 5\' 11"  (1.803 m), weight 81.8 kg, SpO2 100 %.        Intake/Output Summary (Last 24 hours) at 08/04/2021 1412 Last data filed at 08/04/2021 1400 Gross per 24 hour  Intake 1000 ml  Output 1070 ml  Net -70 ml   Filed Weights   08/04/21 0948  Weight: 81.8 kg    Examination: General: Critically ill appearing elderly F intubated sedated NAD  HENT: Post burr hole changes. Surgical site c/d. pink mmm moderated clear oral secretions. Anicteric sclera ETT secyre  Lungs: CTAb,  symmetrical chest expansion, mechanically ventilated  Cardiovascular: rrr s1s2 no rgm cap refill brisk 2+ radial pulses  Abdomen: soft round ndnt + bowel sounds  Extremities: No acute  joint deformity no cyanosis or clubbing L rad art line  Neuro: Sedated, moving spontaneously. Pupils 56mm. Not following commands  GU: Foley, yellow urine   Resolved Hospital Problem list     Assessment & Plan:   Acute encephalopathy, multifactorial -SDH, anesthetic  P -goal RASS 0  -- adding fent gtt to prop  -frequent neuro checks   Acute respiratory failure -- post operative  -remained intubated after case 2/2 somnolence  Hx Tobacco use, COPD  P -CXR, ABG  in ICU -WUA/SBT after CT H  -VAP, PAD, pulm hygiene   Large R SDH,  POD 0 evacuation Prior VPS due to ruptured Acomm requiring coil EVD then VPS (2003), POD 0 ligation  -(op date 9/22) P -Repeat CT H per NSGY  -SBP < 160  -will add keppra for seizure ppx   HTN P -PRN labetalol for SBP < 160  -home cozaar when enteral access established   DM2 P -SSI  Hx Anxiety, Depression P -resume home meds   Best Practice (right click and "Reselect all SmartList Selections" daily)   Diet/type: NPO DVT prophylaxis: SCD GI prophylaxis: PPI Lines: arterial line Foley:  Yes, and it is still needed Code Status:  full code Last date of multidisciplinary goals of care discussion [pending]  Labs   CBC: Recent Labs  Lab 08/03/21 1821 08/04/21 0521  WBC 12.3* 8.8  NEUTROABS 10.3*  --   HGB 11.7* 11.8*  HCT 35.3* 36.1  MCV  91.7 91.2  PLT 293 875    Basic Metabolic Panel: Recent Labs  Lab 08/03/21 1821 08/04/21 0521  NA 138 138  K 3.9 3.4*  CL 104 103  CO2 25 25  GLUCOSE 126* 129*  BUN 12 8  CREATININE 0.76 0.64  CALCIUM 9.1 8.8*   GFR: Estimated Creatinine Clearance: 62.7 mL/min (by C-G formula based on SCr of 0.64 mg/dL). Recent Labs  Lab 08/03/21 1821 08/04/21 0521  WBC 12.3* 8.8    Liver Function Tests: Recent Labs  Lab 08/03/21 1821  AST 14*  ALT 9  ALKPHOS 61  BILITOT 0.7  PROT 6.0*  ALBUMIN 3.4*   No results for input(s): LIPASE, AMYLASE in the last 168 hours. No results for  input(s): AMMONIA in the last 168 hours.  ABG No results found for: PHART, PCO2ART, PO2ART, HCO3, TCO2, ACIDBASEDEF, O2SAT   Coagulation Profile: Recent Labs  Lab 08/03/21 2233  INR 1.0    Cardiac Enzymes: No results for input(s): CKTOTAL, CKMB, CKMBINDEX, TROPONINI in the last 168 hours.  HbA1C: Hgb A1c MFr Bld  Date/Time Value Ref Range Status  12/08/2020 12:25 PM 6.0 (H) <5.7 % of total Hgb Final    Comment:    For someone without known diabetes, a hemoglobin  A1c value between 5.7% and 6.4% is consistent with prediabetes and should be confirmed with a  follow-up test. . For someone with known diabetes, a value <7% indicates that their diabetes is well controlled. A1c targets should be individualized based on duration of diabetes, age, comorbid conditions, and other considerations. . This assay result is consistent with an increased risk of diabetes. . Currently, no consensus exists regarding use of hemoglobin A1c for diagnosis of diabetes for children. Marland Kitchen   07/07/2020 02:55 PM 6.2 (H) <5.7 % of total Hgb Final    Comment:    For someone without known diabetes, a hemoglobin  A1c value between 5.7% and 6.4% is consistent with prediabetes and should be confirmed with a  follow-up test. . For someone with known diabetes, a value <7% indicates that their diabetes is well controlled. A1c targets should be individualized based on duration of diabetes, age, comorbid conditions, and other considerations. . This assay result is consistent with an increased risk of diabetes. . Currently, no consensus exists regarding use of hemoglobin A1c for diagnosis of diabetes for children. .     CBG: Recent Labs  Lab 08/04/21 0414 08/04/21 0830 08/04/21 0834 08/04/21 0852 08/04/21 1256  GLUCAP 110* 66* 135* 125* 136*    Review of Systems:   Unable to obtain, intubated sedated   Past Medical History:  She,  has a past medical history of Anxiety and depression,  Blepharospasm ( ), Breast cancer ([place]) (1987), Carotid bruit, COPD (chronic obstructive pulmonary disease) ([place]), Diabetes mellitus without complication ([place]), Fuchs' corneal dystrophy, GERD (gastroesophageal reflux disease) ( ), Hyperlipidemia, Hypertension ( ), Insomnia ( ), Intracerebral bleed ([place]) (2003), Meningioma ([place]), Neurocardiogenic syncope, Pacemaker, Personal history of radiation therapy, S/P VP shunt (2003), Sinus node dysfunction ([facility]) ( ), Tobacco abuse ( ), and Type II or unspecified type diabetes mellitus without mention of complication, not stated as uncontrolled ( ).   Surgical History:   Past Surgical History:  Procedure Laterality Date   ANEURYSM COILING  2003   Cerebral aneurysm ;Dr. Luanne Bras   BREAST LUMPECTOMY Right    For cancer, no recurrence   CHOLECYSTECTOMY     PACEMAKER GENERATOR CHANGE N/A 09/25/2014   BTK dual chamber  pacemkaer implanted by Dr Rayann Heman   PACEMAKER INSERTION  07/12/2006   [employer] Victory XL DR  -  Dr. Tami Ribas   PARTIAL HYSTERECTOMY     VENTRICULOPERITONEAL SHUNT  2003   Dr. Erline Levine     Social History:   reports that she has been smoking cigarettes. She started smoking about 2 years ago. She has never used smokeless tobacco. She reports that she does not drink alcohol and does not use drugs.   Family History:  Her family history includes Cancer in her mother; Diabetes in her son; Heart attack in her father and son; Hypertension in an other family member.   Allergies Allergies  Allergen Reactions   Oxaprozin Rash     Home Medications  Prior to Admission medications   Medication Sig Start Date End Date Taking? Authorizing Provider  acetaminophen (TYLENOL) 500 MG tablet Take 1,000 mg by mouth in the morning and at bedtime.   Yes [provider]  Artificial Tear Ointment (DRY EYES OP) Place 1 drop into both eyes daily as needed (dry eyes).   Yes [provider]  aspirin EC 325 MG tablet Take 325 mg by  mouth daily.   Yes [provider]  buPROPion (WELLBUTRIN SR) 150 MG 12 hr tablet Take 1 tablet (150 mg total) by mouth daily. 07/28/21  Yes Ngetich, Dinah C, NP  clonazePAM (KLONOPIN) 1 MG tablet Take 1 tablet (1 mg total) by mouth 3 (three) times daily as needed. Patient taking differently: Take 1 mg by mouth 3 (three) times daily as needed for anxiety. 07/28/21  Yes Ngetich, Dinah C, NP  losartan (COZAAR) 100 MG tablet Take 1/2 tablet by mouth in the morning and Take 1/2 tablet by mouth in the evening. Patient taking differently: No sig reported 08/01/21  Yes Wardell Honour, MD  metFORMIN (GLUCOPHAGE) 500 MG tablet TAKE 1 TABLET BY MOUTH WITH BREAKFAST AND WITH SUPPER FOR DIABETES Patient taking differently: Take 500 mg by mouth daily with supper. 05/25/21  Yes [name], NP  pantoprazole (PROTONIX) 40 MG tablet TAKE 1 TABLET(40 MG) BY MOUTH DAILY Patient taking differently: Take 40 mg by mouth daily as needed (for acid reflux). 01/19/21  Yes [name], NP  Blood Glucose Monitoring Suppl (ONE TOUCH ULTRA 2) w/Device KIT USE UP TO FOUR TIMES DAILY AS DIRECTED 02/16/21   [name], NP     Critical care time: 46 minutes     CRITICAL CARE Performed by: Cristal Generous   Total critical care time: 46 minutes  Critical care time was exclusive of separately billable procedures and treating other patients.  Critical care was necessary to treat or prevent imminent or life-threatening deterioration.  Critical care was time spent personally by me on the following activities: development of treatment plan with patient and/or surrogate as well as nursing, discussions with consultants, evaluation of patient's response to treatment, examination of patient, obtaining history from patient or surrogate, ordering and performing treatments and interventions, ordering and review of laboratory studies, ordering and review of radiographic studies, pulse oximetry and re-evaluation  of patient's condition.  Eliseo Gum MSN, AGACNP-BC [place] for pager  08/04/2021, 2:12 PM

## 2021-08-04 NOTE — ED Notes (Signed)
Used mouth swabs to moisten pt mouth at this time.

## 2021-08-05 ENCOUNTER — Encounter (HOSPITAL_COMMUNITY): Payer: Self-pay | Admitting: Neurological Surgery

## 2021-08-05 ENCOUNTER — Inpatient Hospital Stay (HOSPITAL_COMMUNITY): Payer: Medicare Other

## 2021-08-05 DIAGNOSIS — W19XXXA Unspecified fall, initial encounter: Secondary | ICD-10-CM | POA: Diagnosis not present

## 2021-08-05 DIAGNOSIS — R9089 Other abnormal findings on diagnostic imaging of central nervous system: Secondary | ICD-10-CM | POA: Diagnosis not present

## 2021-08-05 DIAGNOSIS — I10 Essential (primary) hypertension: Secondary | ICD-10-CM | POA: Diagnosis not present

## 2021-08-05 DIAGNOSIS — S065X9A Traumatic subdural hemorrhage with loss of consciousness of unspecified duration, initial encounter: Secondary | ICD-10-CM | POA: Diagnosis not present

## 2021-08-05 LAB — BASIC METABOLIC PANEL
Anion gap: 10 (ref 5–15)
BUN: 13 mg/dL (ref 8–23)
CO2: 21 mmol/L — ABNORMAL LOW (ref 22–32)
Calcium: 8.7 mg/dL — ABNORMAL LOW (ref 8.9–10.3)
Chloride: 105 mmol/L (ref 98–111)
Creatinine, Ser: 0.65 mg/dL (ref 0.44–1.00)
GFR, Estimated: >60 mL/min (ref >60)
Glucose, Bld: 166 mg/dL — ABNORMAL HIGH (ref 70–99)
Potassium: 3.8 mmol/L (ref 3.5–5.1)
Sodium: 136 mmol/L (ref 135–145)

## 2021-08-05 LAB — TRIGLYCERIDES: Triglycerides: 114 mg/dL (ref <150)

## 2021-08-05 LAB — CBC
HCT: 33.4 % — ABNORMAL LOW (ref 36.0–46.0)
Hemoglobin: 11.1 g/dL — ABNORMAL LOW (ref 12.0–15.0)
MCH: 29.7 pg (ref 26.0–34.0)
MCHC: 33.2 g/dL (ref 30.0–36.0)
MCV: 89.3 fL (ref 80.0–100.0)
Platelets: 253 10*3/uL (ref 150–400)
RBC: 3.74 MIL/uL — ABNORMAL LOW (ref 3.87–5.11)
RDW: 14.6 % (ref 11.5–15.5)
WBC: 8 10*3/uL (ref 4.0–10.5)
nRBC: 0 % (ref 0.0–0.2)

## 2021-08-05 MED ORDER — ACETAMINOPHEN 325 MG PO TABS
650.0000 mg | ORAL_TABLET | ORAL | Status: DC | PRN
  Administered 2021-08-05 – 2021-08-06 (×2): 650 mg via ORAL
  Administered 2021-08-07: 325 mg via ORAL
  Administered 2021-08-07: 650 mg via ORAL
  Administered 2021-08-07 (×2): 325 mg via ORAL
  Administered 2021-08-08 – 2021-08-10 (×7): 650 mg via ORAL
  Filled 2021-08-05 (×13): qty 2

## 2021-08-05 MED ORDER — SODIUM CHLORIDE 0.9 % IV BOLUS
1000.0000 mL | Freq: Once | INTRAVENOUS | Status: AC
  Administered 2021-08-05: 1000 mL via INTRAVENOUS

## 2021-08-05 MED ORDER — ACETAMINOPHEN 650 MG RE SUPP: 650.0000 mg | RECTAL | Status: DC | PRN

## 2021-08-05 MED ORDER — HYDROCODONE-ACETAMINOPHEN 5-325 MG PO TABS
1.0000 | ORAL_TABLET | ORAL | Status: DC | PRN
Start: 2021-08-05 — End: 2021-08-06
  Administered 2021-08-05 – 2021-08-06 (×4): 1 via ORAL
  Filled 2021-08-05 (×4): qty 1

## 2021-08-05 MED ORDER — DOCUSATE SODIUM 100 MG PO CAPS
100.0000 mg | ORAL_CAPSULE | Freq: Two times a day (BID) | ORAL | Status: DC
  Administered 2021-08-05 – 2021-08-10 (×8): 100 mg via ORAL
  Filled 2021-08-05 (×9): qty 1

## 2021-08-05 NOTE — Progress Notes (Signed)
NAME:  Mary Higgins, MRN:  [id number], DOB:  1941/09/22, LOS: 1 ADMISSION DATE:  08/03/2021, CONSULTATION DATE:  08/04/21 REFERRING MD:  Zada Finders - NSGY, CHIEF COMPLAINT:  respiratory failure    History of Present Illness:  80 yo F PMH SSS s/p ICD, ruptured Acomm aneurysm s/p coiling EVD and VPS (2003), DM, COPD presented to ED 9/21 following a mechanical fall. Found to have large R SDH, mixed density with 72mm midline shift.   Admitted to [facility] service. Taken to OR with NSGY 9/22 for evacuation of hematoma, as well as ligation of VPS. Following case, remains intubated.  CCM consulted for help with medical management  Significant Hospital Events: Including procedures, antibiotic start and stop dates in addition to other pertinent events   9/21 to ED following mechanical fall  9/22 CTH with large R SDH. OR with NSGY for evac, ligation of old VPS. Stayed intubated, PCCM consulted  Tolerated pressure support trial, successfully extubated  Interim History / Subjective:  No overnight issues, repeat CT scan showed improvement in subdural hematoma postevacuation She tolerated spontaneous breathing trial, was successfully extubated  Objective   Blood pressure 99/60, pulse 62, temperature (!) 96.3 F (35.7 C), temperature source Axillary, resp. rate 17, height 5\' 11"  (1.803 m), weight 81.8 kg, SpO2 99 %.    Vent Mode: PRVC FiO2 (%):  [40 %-100 %] 40 % Set Rate:  [16 bmp] 16 bmp Vt Set:  [560 mL] 560 mL PEEP:  [5 cmH20] 5 cmH20 Pressure Support:  [8 cmH20] 8 cmH20 Plateau Pressure:  [14 cmH20-15 cmH20] 15 cmH20   Intake/Output Summary (Last 24 hours) at 08/05/2021 1053 Last data filed at 08/05/2021 0600 Gross per 24 hour  Intake 2174.53 ml  Output 1070 ml  Net 1104.53 ml   Filed Weights   08/04/21 0948  Weight: 81.8 kg    Examination: General: Critically ill appearing elderly Caucasian female, lying on the bed HENT: Post burr hole changes. Surgical site c/d. pink mmm moderated  clear oral secretions. Anicteric sclera ETT secyre  Lungs: CTAb,  symmetrical chest expansion, mechanically ventilated  Cardiovascular: rrr s1s2 no rgm cap refill brisk 2+ radial pulses  Abdomen: soft round ndnt + bowel sounds  Extremities: No acute joint deformity no cyanosis or clubbing  Neuro: Alert, awake, following commands, moving all 4 extremities Skin: No rash  Resolved Hospital Problem list     Assessment & Plan:  Acute on chronic right-sided subdural hemorrhage with midline shift and cerebral edema s/p bur hole evacuation S/p VP shunt ligation Repeat CT scan showed improvement in midline shift and cerebral edema and subdural hematoma Continue neuro watch every hour Maintain blood pressure <160 Continue Keppra for seizure prophylaxis  Acute encephalopathy, multifactorial Patient is still lethargic Avoid sedation  Acute respiratory failure -- post operative  Patient tolerated spontaneous breathing trial, was successfully extubated this morning Watch for signs of respiratory distress  Frequent falls PT/OT evaluation  HTN, controlled Continue as needed hydralazine and labetalol Resume home blood pressure medications once speech and swallow evaluation is done  DM2 Blood sugars are well controlled Continue sliding scale insulin Holding of metformin  Best Practice (right click and "Reselect all SmartList Selections" daily)   Diet/type: NPO awaiting swallow evaluation DVT prophylaxis: SCD GI prophylaxis: PPI Foley: Discontinue Code Status:  full code Last date of multidisciplinary goals of care discussion [Per primary team]  Labs   CBC: Recent Labs  Lab 08/03/21 1821 08/04/21 0521 08/04/21 1528 08/05/21 0529  WBC 12.3* 8.8  --  8.0  NEUTROABS 10.3*  --   --   --   HGB 11.7* 11.8* 11.2* 11.1*  HCT 35.3* 36.1 33.0* 33.4*  MCV 91.7 91.2  --  89.3  PLT 293 290  --  758    Basic Metabolic Panel: Recent Labs  Lab 08/03/21 1821 08/04/21 0521  08/04/21 1528 08/05/21 0529  NA 138 138 139 136  K 3.9 3.4* 3.7 3.8  CL 104 103  --  105  CO2 25 25  --  21*  GLUCOSE 126* 129*  --  166*  BUN 12 8  --  13  CREATININE 0.76 0.64  --  0.65  CALCIUM 9.1 8.8*  --  8.7*   GFR: Estimated Creatinine Clearance: 62.7 mL/min (by C-G formula based on SCr of 0.65 mg/dL). Recent Labs  Lab 08/03/21 1821 08/04/21 0521 08/05/21 0529  WBC 12.3* 8.8 8.0    Liver Function Tests: Recent Labs  Lab 08/03/21 1821  AST 14*  ALT 9  ALKPHOS 61  BILITOT 0.7  PROT 6.0*  ALBUMIN 3.4*   No results for input(s): LIPASE, AMYLASE in the last 168 hours. No results for input(s): AMMONIA in the last 168 hours.  ABG    Component Value Date/Time   PHART 7.468 (H) 08/04/2021 1528   PCO2ART 32.9 08/04/2021 1528   PO2ART 125 (H) 08/04/2021 1528   HCO3 23.8 08/04/2021 1528   TCO2 25 08/04/2021 1528   O2SAT 99.0 08/04/2021 1528     Coagulation Profile: Recent Labs  Lab 08/03/21 2233  INR 1.0    Cardiac Enzymes: No results for input(s): CKTOTAL, CKMB, CKMBINDEX, TROPONINI in the last 168 hours.  HbA1C: Hgb A1c MFr Bld  Date/Time Value Ref Range Status  08/04/2021 05:21 AM 6.2 (H) 4.8 - 5.6 % Final    Comment:    (NOTE)         Prediabetes: 5.7 - 6.4         Diabetes: >6.4         Glycemic control for adults with diabetes: <7.0   12/08/2020 12:25 PM 6.0 (H) <5.7 % of total Hgb Final    Comment:    For someone without known diabetes, a hemoglobin  A1c value between 5.7% and 6.4% is consistent with prediabetes and should be confirmed with a  follow-up test. . For someone with known diabetes, a value <7% indicates that their diabetes is well controlled. A1c targets should be individualized based on duration of diabetes, age, comorbid conditions, and other considerations. . This assay result is consistent with an increased risk of diabetes. . Currently, no consensus exists regarding use of hemoglobin A1c for diagnosis of diabetes  for children. .     CBG: Recent Labs  Lab 08/04/21 0414 08/04/21 0830 08/04/21 0834 08/04/21 0852 08/04/21 1256  GLUCAP 110* 66* 135* 125* 136*   Critical care time: 40 minutes     CRITICAL CARE Performed by: Jacky Kindle   Total critical care time: 46 minutes  Critical care time was exclusive of separately billable procedures and treating other patients.  Critical care was necessary to treat or prevent imminent or life-threatening deterioration.  Critical care was time spent personally by me on the following activities: development of treatment plan with patient and/or surrogate as well as nursing, discussions with consultants, evaluation of patient's response to treatment, examination of patient, obtaining history from patient or surrogate, ordering and performing treatments and interventions, ordering and review of laboratory studies, ordering and review of radiographic studies,  pulse oximetry and re-evaluation of patient's condition.  Eliseo Gum MSN, AGACNP-BC [place] for pager  08/05/2021, 10:53 AM

## 2021-08-05 NOTE — Procedures (Signed)
Extubation Procedure Note  Patient Details:   Name: Mary Higgins DOB: 08-Aug-1941 MRN: [id number]   Airway Documentation:    Vent end date: 08/05/21 Vent end time: 0905   Evaluation  O2 sats: stable throughout Complications: No apparent complications Patient did tolerate procedure well. Bilateral Breath Sounds: Clear, Diminished   Yes  Pt was extubated per Md order and is stable on RA. Cuff leak was noted prior to extubation and no stridor post. Pt vitals stable at this time.RT will monitor.   Ronaldo Miyamoto 08/05/2021, 9:06 AM

## 2021-08-05 NOTE — Anesthesia Postprocedure Evaluation (Signed)
Anesthesia Post Note  Patient: Mary Higgins  Procedure(s) Performed: Haskell Flirt FOR SUBDURAL HEMATOMA (Right)     Patient location during evaluation: SICU Anesthesia Type: General Level of consciousness: sedated Pain management: pain level controlled Vital Signs Assessment: post-procedure vital signs reviewed and stable Respiratory status: patient remains intubated per anesthesia plan Cardiovascular status: stable Postop Assessment: no apparent nausea or vomiting Anesthetic complications: no   No notable events documented.  Last Vitals:  Vitals:   08/05/21 0615 08/05/21 0753  BP: 137/71 101/79  Pulse: 62   Resp: 17   Temp:    SpO2: 99% 100%    Last Pain:  Vitals:   08/05/21 0400  TempSrc: Axillary  PainSc:                  [place]

## 2021-08-05 NOTE — Progress Notes (Signed)
Neurosurgery Service Progress Note  Subjective: No acute events overnight, FC post-op   Objective: Vitals:   08/05/21 0545 08/05/21 0600 08/05/21 0615 08/05/21 0753  BP: 117/65 126/69 137/71 101/79  Pulse: 61 61 62   Resp: 18 16 17    Temp:      TempSrc:      SpO2: 99% 99% 99% 100%  Weight:      Height:        Physical Exam: Intubated, eyes open to voice, Fcx4 reliably without preference, incisions c/d/I   Assessment & Plan: 80 y.o. woman w/ VPS for prior aSAH-related HCP and ruptured Acomm s/p coiling, now w/ large R SDH s/p R burr hole evacuation and shunt ligation, recovering well. CTH w/ vents still smaller than baseline due to residual, good evac, no e/o HCP.  -WTE per CCM -no change in neurosurgical plan of care  Judith Part  08/05/21 8:13 AM

## 2021-08-05 NOTE — TOC CAGE-AID Note (Signed)
Transition of Care [facility]) - CAGE-AID Screening   Patient Details  Name: Mary Higgins MRN: [id number] Date of Birth: Apr 05, 1941  [facility] of [facility] CM/SW Contact:    Chadwick Reiswig C Tarpley-Carter, [place] Phone Number: 08/05/2021, 9:14 AM   Clinical Narrative: Pt is unable to participate in Cage Aid.  Nariyah Osias Tarpley-Carter, MSW, LCSW-A Pronouns:  She/Her/Hers [facility]itions of Care Clinical Social Worker Direct Number:  [phone] [email or web address]  CAGE-AID Screening: Substance Abuse Screening unable to be completed due to: : Patient unable to participate             Substance Abuse Education Offered: No

## 2021-08-06 DIAGNOSIS — R9089 Other abnormal findings on diagnostic imaging of central nervous system: Secondary | ICD-10-CM | POA: Diagnosis not present

## 2021-08-06 DIAGNOSIS — S065X9A Traumatic subdural hemorrhage with loss of consciousness of unspecified duration, initial encounter: Secondary | ICD-10-CM | POA: Diagnosis not present

## 2021-08-06 MED ORDER — METFORMIN HCL 500 MG PO TABS
500.0000 mg | ORAL_TABLET | Freq: Every day | ORAL | Status: DC
  Administered 2021-08-07 – 2021-08-10 (×4): 500 mg via ORAL
  Filled 2021-08-06 (×4): qty 1

## 2021-08-06 MED ORDER — POLYETHYLENE GLYCOL 3350 17 G PO PACK: 17.0000 g | PACK | Freq: Every day | ORAL | Status: DC

## 2021-08-06 MED ORDER — LEVETIRACETAM 500 MG PO TABS
500.0000 mg | ORAL_TABLET | Freq: Two times a day (BID) | ORAL | Status: DC
  Administered 2021-08-06 – 2021-08-10 (×8): 500 mg via ORAL
  Filled 2021-08-06 (×9): qty 1

## 2021-08-06 MED ORDER — PANTOPRAZOLE SODIUM 40 MG PO TBEC
40.0000 mg | DELAYED_RELEASE_TABLET | Freq: Every day | ORAL | Status: DC
  Administered 2021-08-06 – 2021-08-10 (×5): 40 mg via ORAL
  Filled 2021-08-06 (×5): qty 1

## 2021-08-06 MED ORDER — HYDROCODONE-ACETAMINOPHEN 5-325 MG PO TABS
1.0000 | ORAL_TABLET | ORAL | Status: DC | PRN
  Administered 2021-08-06: 1 via ORAL
  Administered 2021-08-06: 2 via ORAL
  Administered 2021-08-07 – 2021-08-08 (×6): 1 via ORAL
  Administered 2021-08-08: 2 via ORAL
  Administered 2021-08-09 – 2021-08-10 (×2): 1 via ORAL
  Filled 2021-08-06 (×2): qty 1
  Filled 2021-08-06: qty 2
  Filled 2021-08-06: qty 1
  Filled 2021-08-06: qty 2
  Filled 2021-08-06 (×6): qty 1

## 2021-08-06 MED ORDER — LOSARTAN POTASSIUM 50 MG PO TABS
50.0000 mg | ORAL_TABLET | Freq: Every day | ORAL | Status: DC
  Administered 2021-08-06 – 2021-08-10 (×5): 50 mg via ORAL
  Filled 2021-08-06 (×5): qty 1

## 2021-08-06 NOTE — Evaluation (Signed)
Physical Therapy Evaluation Patient Details Name: Mary Higgins MRN: [id number] DOB: June 08, 1941 Today's Date: 08/06/2021  History of Present Illness  80 yo female presenting to ED on 9/22 after a mechnical fall. CT head showing large acute right subdural hematoma. S/p R burr hole evacuation on 9/22.   Intubated 9/22-9/23. PMH including anxiety, depression, Blepharospasm, breast cancer s/p lumpectomy, bilateral carotis bruit, DM, fuchs' corneal dystrophy, HTN, intracranial bleed s/p coiling by Dr Estanislado Pandy and shunt placement by Dr Vertell Limber (2003), and neuracardiogenic syncope.  Clinical Impression  PTA, patient lives with son and ambulates with RW. Patient requires assist for ADLs and has had ~3 falls in past 3 months. Patient presents with generalized weakness, impaired balance, decreased activity tolerance, poor safety awareness, and impaired cognition. Patient requires minA for in room mobility with RW, requiring cues for close proximity throughout with intermittent follow through. Patient with feeling of "knees giving out" while standing at sink for ~5 minutes. Patient will benefit from skilled PT services during acute stay to address listed deficits. Recommend CIR following discharge to maximize functional independence and safety.      Recommendations for follow up therapy are one component of a multi-disciplinary discharge planning process, led by the attending physician.  Recommendations may be updated based on patient status, additional functional criteria and insurance authorization.  Follow Up Recommendations CIR    Equipment Recommendations  Other (comment) (TBD)    Recommendations for Other Services Rehab consult     Precautions / Restrictions Precautions Precautions: Fall Restrictions Weight Bearing Restrictions: No      Mobility  Bed Mobility Overal bed mobility: Needs Assistance Bed Mobility: Supine to Sit     Supine to sit: Min guard;HOB elevated     General bed  mobility comments: HOB elevated and use of bed rails.    Transfers Overall transfer level: Needs assistance Equipment used: Rolling Dorie Ohms (2 wheeled) Transfers: Sit to/from Stand Sit to Stand: Min assist         General transfer comment: minA to rise and steady. Cues for hand placement for pushing from bed rather than RW.  Ambulation/Gait Ambulation/Gait assistance: Min assist Gait Distance (Feet): 20 Feet Assistive device: Rolling Araseli Sherry (2 wheeled) Gait Pattern/deviations: Step-to pattern;Decreased stride length;Shuffle;Trunk flexed Gait velocity: decreased   General Gait Details: minA for balance throughout. Tactile and verbal cues to maintain close proximity with RW, intermittent follow through. Patient with poor safety awareness.  Stairs            Wheelchair Mobility    Modified Rankin (Stroke Patients Only)       Balance Overall balance assessment: Needs assistance Sitting-balance support: No upper extremity supported;Feet supported Sitting balance-Leahy Scale: Fair     Standing balance support: Bilateral upper extremity supported;During functional activity Standing balance-Leahy Scale: Poor Standing balance comment: reliant on UE support and external assist                             Pertinent Vitals/Pain Pain Assessment: Faces Faces Pain Scale: Hurts little more Pain Location: head Pain Descriptors / Indicators: Headache Pain Intervention(s): Monitored during session    Home Living Family/patient expects to be discharged to:: Private residence Living Arrangements: Children Available Help at Discharge: Family;Available 24 hours/day Type of Home: House Home Access: Stairs to enter Entrance Stairs-Rails: Right Entrance Stairs-Number of Steps: 5 Home Layout: Two level;Other (Comment);Able to live on main level with bedroom/bathroom (has basement) Home Equipment: Shower seat;Cane - single point;Christiann Hagerty -  2 wheels      Prior Function  Level of Independence: Needs assistance   Gait / Transfers Assistance Needed: ambulates with RW. 3 falls in 3 past months  ADL's / Homemaking Assistance Needed: son assists as needed with dressing and getting in/out of tub        Hand Dominance        Extremity/Trunk Assessment   Upper Extremity Assessment Upper Extremity Assessment: Defer to OT evaluation    Lower Extremity Assessment Lower Extremity Assessment: Generalized weakness    Cervical / Trunk Assessment Cervical / Trunk Assessment: Kyphotic  Communication   Communication: No difficulties  Cognition Arousal/Alertness: Awake/alert Behavior During Therapy: WFL for tasks assessed/performed Overall Cognitive Status: Impaired/Different from baseline Area of Impairment: Attention;Memory;Following commands;Safety/judgement;Awareness;Problem solving                   Current Attention Level: Sustained Memory: Decreased short-term memory Following Commands: Follows one step commands with increased time Safety/Judgement: Decreased awareness of safety;Decreased awareness of deficits Awareness: Emergent Problem Solving: Slow processing;Requires verbal cues;Requires tactile cues General Comments: Poor safety awareness during use of RW with ambulation, required cues with intermittent follow through      General Comments      Exercises     Assessment/Plan    PT Assessment Patient needs continued PT services  PT Problem List Decreased strength;Decreased activity tolerance;Decreased balance;Decreased mobility;Decreased safety awareness;Decreased knowledge of use of DME       PT Treatment Interventions DME instruction;Gait training;Stair training;Functional mobility training;Balance training;Therapeutic exercise;Therapeutic activities;Neuromuscular re-education;Patient/family education    PT Goals (Current goals can be found in the Care Plan section)  Acute Rehab PT Goals Patient Stated Goal: to get  better PT Goal Formulation: With patient/family Time For Goal Achievement: 08/20/21 Potential to Achieve Goals: Good    Frequency Min 4X/week   Barriers to discharge        Co-evaluation               AM-PAC PT "6 Clicks" Mobility  Outcome Measure Help needed turning from your back to your side while in a flat bed without using bedrails?: A Little Help needed moving from lying on your back to sitting on the side of a flat bed without using bedrails?: A Little Help needed moving to and from a bed to a chair (including a wheelchair)?: A Little Help needed standing up from a chair using your arms (e.g., wheelchair or bedside chair)?: A Little Help needed to walk in hospital room?: A Little Help needed climbing 3-5 steps with a railing? : A Lot 6 Click Score: 17    End of Session Equipment Utilized During Treatment: Gait belt Activity Tolerance: Patient tolerated treatment well Patient left: in chair;with call bell/phone within reach;with chair alarm set;with family/visitor present Nurse Communication: Mobility status PT Visit Diagnosis: Unsteadiness on feet (R26.81);Muscle weakness (generalized) (M62.81);History of falling (Z91.81);Difficulty in walking, not elsewhere classified (R26.2)    Time: 4580-9983 PT Time Calculation (min) (ACUTE ONLY): 27 min   Charges:   PT Evaluation $PT Eval Moderate Complexity: 1 Mod          Nioma Mccubbins A. Gilford Rile PT, DPT Acute Rehabilitation Services Pager [phone] Office [phone]   Linna Hoff 08/06/2021, 12:49 PM

## 2021-08-06 NOTE — Evaluation (Signed)
Occupational Therapy Evaluation Patient Details Name: Mary Higgins MRN: [id number] DOB: 1941-08-22 Today's Date: 08/06/2021   History of Present Illness 80 yo female presenting to ED on 9/22 after a mechnical fall. CT head showing large acute right subdural hematoma. S/p R burr hole evacuation on 9/22.   Intubated 9/22-9/23. PMH including anxiety, depression, Blepharospasm, breast cancer s/p lumpectomy, bilateral carotis bruit, DM, fuchs' corneal dystrophy, HTN, intracranial bleed s/p coiling by Dr Estanislado Pandy and shunt placement by Dr Vertell Limber (2003), and neuracardiogenic syncope.   Clinical Impression   PTA, pt was living with her son who assisted with ADLs as needed; pt using RW and cane for functional mobility. Pt currently requiring Min A for UB ADLs, Mod-Max A for LB ADLs, and Min A +2 for functional mobility with RW. Pt presenting with decreased safety awareness, balance, coordination,  strength, and cognition. Pt very motivated to participate in therapy and son is very supportive. Pt would benefit from further acute OT to facilitate safe dc. Recommend dc to CIR for intensive OT to optimize safety, independence with ADLs, and return to PLOF as well as decrease caregiver burden.      Recommendations for follow up therapy are one component of a multi-disciplinary discharge planning process, led by the attending physician.  Recommendations may be updated based on patient status, additional functional criteria and insurance authorization.   Follow Up Recommendations  CIR    Equipment Recommendations  Other (comment) (Defer to next venue)    Recommendations for Other Services Rehab consult;PT consult;Speech consult     Precautions / Restrictions Precautions Precautions: Fall Restrictions Weight Bearing Restrictions: No      Mobility Bed Mobility Overal bed mobility: Needs Assistance Bed Mobility: Supine to Sit     Supine to sit: Min guard;HOB elevated     General bed  mobility comments: HOB elevated and use of bed rails.    Transfers Overall transfer level: Needs assistance Equipment used: Rolling walker (2 wheeled) Transfers: Sit to/from Stand Sit to Stand: Min assist         General transfer comment: minA to rise and steady. Cues for hand placement for pushing from bed rather than RW.    Balance Overall balance assessment: Needs assistance Sitting-balance support: No upper extremity supported;Feet supported Sitting balance-Leahy Scale: Fair     Standing balance support: Bilateral upper extremity supported;During functional activity Standing balance-Leahy Scale: Poor Standing balance comment: reliant on UE support and external assist                           ADL either performed or assessed with clinical judgement   ADL Overall ADL's : Needs assistance/impaired Eating/Feeding: Set up;Sitting   Grooming: Oral care;Minimal assistance;Standing;Cueing for sequencing Grooming Details (indicate cue type and reason): MinA for balance balance. Pt with one LOB at sink (R knee giving) and requiring Min A for correction. Noting decreased FM skills, pinch strength, and problem sovlign while performing oral care Upper Body Bathing: Minimal assistance;Sitting   Lower Body Bathing: Moderate assistance;Sit to/from stand   Upper Body Dressing : Minimal assistance;Sitting   Lower Body Dressing: Maximal assistance;Sit to/from stand   Toilet Transfer: Minimal assistance;Ambulation;RW (simulated to recliner) Toilet Transfer Details (indicate cue type and reason): Min A for power up and gaining balance. Toileting- Clothing Manipulation and Hygiene: Minimal assistance;Sitting/lateral lean Toileting - Clothing Manipulation Details (indicate cue type and reason): Pt performing peri care while at sink; Min A for balance  Functional mobility during ADLs: Minimal assistance;+2 for safety/equipment;Rolling walker General ADL Comments: Pt  presenting with decreased balance, cognition, and activity tolerance. Despite fatigue, pt very motviated to particiaptei in therapy     Vision Baseline Vision/History: 1 Wears glasses Additional Comments: Will continue to assess. no noted deficits during ADLs     Perception     Praxis      Pertinent Vitals/Pain Pain Assessment: Faces Faces Pain Scale: Hurts little more Pain Location: headache Pain Descriptors / Indicators: Headache Pain Intervention(s): Monitored during session;Limited activity within patient's tolerance;Repositioned     Hand Dominance Right   Extremity/Trunk Assessment Upper Extremity Assessment Upper Extremity Assessment: RUE deficits/detail;LUE deficits/detail RUE Deficits / Details: Decreased Fm coorindation. Gross strength WFL RUE Coordination: decreased fine motor LUE Deficits / Details: Gross strength WFL LUE Coordination: decreased fine motor   Lower Extremity Assessment Lower Extremity Assessment: Defer to PT evaluation   Cervical / Trunk Assessment Cervical / Trunk Assessment: Kyphotic   Communication Communication Communication: No difficulties   Cognition Arousal/Alertness: Awake/alert Behavior During Therapy: WFL for tasks assessed/performed Overall Cognitive Status: Impaired/Different from baseline Area of Impairment: Attention;Memory;Following commands;Safety/judgement;Awareness;Problem solving                   Current Attention Level: Sustained Memory: Decreased short-term memory Following Commands: Follows one step commands with increased time Safety/Judgement: Decreased awareness of safety;Decreased awareness of deficits Awareness: Emergent Problem Solving: Slow processing;Requires verbal cues;Requires tactile cues General Comments: Poor safety awareness during use of RW with ambulation, required cues with intermittent follow through. During oral care, pt with decreased insight into errors such as attempting to place  toothpaste cap on upside down; dropping cap, and then placing it on rightside up   General Comments  HR elevating to 120s with activity. SpO2 90s on RA    Exercises     Shoulder Instructions      Home Living Family/patient expects to be discharged to:: Private residence Living Arrangements: Children Available Help at Discharge: Family;Available 24 hours/day Type of Home: House Home Access: Stairs to enter [employer] of Steps: 5 Entrance Stairs-Rails: Right Home Layout: Two level;Other (Comment);Able to live on main level with bedroom/bathroom (has basement)     Bathroom Shower/Tub: Teacher, early years/pre: Standard     Home Equipment: Shower seat;Cane - single point;Walker - 2 wheels          Prior Functioning/Environment Level of Independence: Needs assistance  Gait / Transfers Assistance Needed: ambulates with RW. 3 falls in 3 past months ADL's / Homemaking Assistance Needed: son assists as needed with dressing and getting in/out of tub            OT Problem List: Decreased strength;Decreased range of motion;Decreased activity tolerance;Impaired balance (sitting and/or standing);Decreased knowledge of use of DME or AE;Decreased knowledge of precautions;Decreased safety awareness;Decreased cognition;Pain      OT Treatment/Interventions: Self-care/ADL training;Therapeutic exercise;Energy conservation;DME and/or AE instruction;Therapeutic activities;Patient/family education    OT Goals(Current goals can be found in the care plan section) Acute Rehab OT Goals Patient Stated Goal: to get better OT Goal Formulation: With patient Time For Goal Achievement: 08/20/21 Potential to Achieve Goals: Good  OT Frequency: Min 2X/week   Barriers to D/C:            Co-evaluation PT/OT/SLP Co-Evaluation/Treatment: Yes Reason for Co-Treatment: For patient/therapist safety;To address functional/ADL transfers   OT goals addressed during session: ADL's and  self-care      AM-PAC OT "6 Clicks" Daily Activity  Outcome Measure Help from another person eating meals?: None Help from another person taking care of personal grooming?: A Little Help from another person toileting, which includes using toliet, bedpan, or urinal?: A Lot Help from another person bathing (including washing, rinsing, drying)?: A Lot Help from another person to put on and taking off regular upper body clothing?: A Little Help from another person to put on and taking off regular lower body clothing?: A Lot 6 Click Score: 16   End of Session Equipment Utilized During Treatment: Gait belt;Rolling walker Nurse Communication: Mobility status  Activity Tolerance: Patient tolerated treatment well Patient left: in chair;with call bell/phone within reach;with chair alarm set;with family/visitor present  OT Visit Diagnosis: Unsteadiness on feet (R26.81);Other abnormalities of gait and mobility (R26.89);Muscle weakness (generalized) (M62.81);History of falling (Z91.81)                Time: 1010-1039 OT Time Calculation (min): 29 min Charges:  OT General Charges $OT Visit: 1 Visit OT Evaluation $OT Eval Moderate Complexity: [place], OTR/L Acute Rehab Pager: [phone] Office: [place] 08/06/2021, 1:44 PM

## 2021-08-06 NOTE — Progress Notes (Signed)
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 Found to have large R SDH, mixed density with 22mm midline shift.   Admitted to [facility] service. Taken to OR with NSGY 9/22 for evacuation of hematoma, as well as ligation of VPS. Following case, remains intubated.  CCM consulted for help with medical management  Significant Hospital Events: Including procedures, antibiotic start and stop dates in addition to other pertinent events   9/21 to ED following mechanical fall  9/22 CTH with large R SDH. OR with NSGY for evac, ligation of old VPS. Stayed intubated, PCCM consulted  Tolerated pressure support trial, successfully extubated  Interim History / Subjective:  No overnight issues, repeat CT scan showed improvement in subdural hematoma postevacuation She tolerated spontaneous breathing trial, was successfully extubated  Objective   Blood pressure (!) 134/57, pulse 87, temperature (!) 97.3 F (36.3 C), temperature source Oral, resp. rate 16, height 5\' 11"  (1.803 m), weight 81.8 kg, SpO2 95 %.        Intake/Output Summary (Last 24 hours) at 08/06/2021 0924 Last data filed at 08/06/2021 0600 Gross per 24 hour  Intake 1000.96 ml  Output 1075 ml  Net -74.04 ml   Filed Weights   08/04/21 0948  Weight: 81.8 kg    Examination: General: ill appearing wdwn elderly F reclined in bed NAD  HENT: Surgical site c/d. Anicteric sclera. Pink mmm  Lungs: CTAb symmetrical chest expansion even unlabored on RA  Cardiovascular: rrr s1s2 no rgm  Abdomen: soft round ndnt + bowel sounds  Extremities: no acute joint deformtiy no cyanosis or clubbing  Neuro:AAOx4 following commands  PERRL Skin: pale c/d/wi  Resolved Hospital Problem list    Acute hypoixic respiratory failure Acute encephalopathy   Assessment & Plan:   Acute on chronic R sided SDH with midline shift, cerebral edema s/p burr hole evac S/p VPS ligation  Repeat CT scan showed improvement in midline shift and cerebral edema and subdural hematoma P SBP < 160 Cont keppra for sz ppx  Frequent falls PT/OT evaluation  HTN, controlled Cozaar PRN hydral, labetalol   DM2 Restart home metformin  Stable from PCCM for transfer out of ICU. IF NSGY agrees, will ask TRH to take over medical care with PCCM off    Best Practice (right click and "Reselect all SmartList Selections" daily)   Diet/type: NPO awaiting swallow evaluation DVT prophylaxis: SCD GI prophylaxis: PPI Foley: Discontinue Code Status:  full code Last date of multidisciplinary goals of care discussion [Per primary team]  Labs   CBC: Recent Labs  Lab 08/03/21 1821 08/04/21 0521 08/04/21 1528 08/05/21 0529  WBC 12.3* 8.8  --  8.0  NEUTROABS 10.3*  --   --   --   HGB 11.7* 11.8* 11.2* 11.1*  HCT 35.3* 36.1 33.0* 33.4*  MCV 91.7 91.2  --  89.3  PLT 293 290  --  413    Basic Metabolic Panel: Recent Labs  Lab 08/03/21 1821 08/04/21 0521 08/04/21 1528 08/05/21 0529  NA 138 138 139 136  K 3.9 3.4* 3.7 3.8  CL 104 103  --  105  CO2 25 25  --  21*  GLUCOSE  126* 129*  --  166*  BUN 12 8  --  13  CREATININE 0.76 0.64  --  0.65  CALCIUM 9.1 8.8*  --  8.7*   GFR: Estimated Creatinine Clearance: 62.7 mL/min (by C-G formula based on SCr of 0.65 mg/dL). Recent Labs  Lab 08/03/21 1821 08/04/21 0521 08/05/21 0529  WBC 12.3* 8.8 8.0    Liver Function Tests: Recent Labs  Lab 08/03/21 1821  AST 14*  ALT 9  ALKPHOS 61  BILITOT 0.7  PROT 6.0*  ALBUMIN 3.4*   No results for input(s): LIPASE, AMYLASE in the last 168 hours. No results for input(s): AMMONIA in the last 168 hours.  ABG    Component Value Date/Time    PHART 7.468 (H) 08/04/2021 1528   PCO2ART 32.9 08/04/2021 1528   PO2ART 125 (H) 08/04/2021 1528   HCO3 23.8 08/04/2021 1528   TCO2 25 08/04/2021 1528   O2SAT 99.0 08/04/2021 1528     Coagulation Profile: Recent Labs  Lab 08/03/21 2233  INR 1.0    Cardiac Enzymes: No results for input(s): CKTOTAL, CKMB, CKMBINDEX, TROPONINI in the last 168 hours.  HbA1C: Hgb A1c MFr Bld  Date/Time Value Ref Range Status  08/04/2021 05:21 AM 6.2 (H) 4.8 - 5.6 % Final    Comment:    (NOTE)         Prediabetes: 5.7 - 6.4         Diabetes: >6.4         Glycemic control for adults with diabetes: <7.0   12/08/2020 12:25 PM 6.0 (H) <5.7 % of total Hgb Final    Comment:    For someone without known diabetes, a hemoglobin  A1c value between 5.7% and 6.4% is consistent with prediabetes and should be confirmed with a  follow-up test. . For someone with known diabetes, a value <7% indicates that their diabetes is well controlled. A1c targets should be individualized based on duration of diabetes, age, comorbid conditions, and other considerations. . This assay result is consistent with an increased risk of diabetes. . Currently, no consensus exists regarding use of hemoglobin A1c for diagnosis of diabetes for children. .     CBG: Recent Labs  Lab 08/04/21 0414 08/04/21 0830 08/04/21 0834 08/04/21 0852 08/04/21 1256  GLUCAP 110* 66* 135* 125* 136*   CCT- na  Eliseo Gum MSN, AGACNP-BC [place] for pager 08/06/2021, 9:24 AM

## 2021-08-06 NOTE — Progress Notes (Signed)
Patient ID: Mary Higgins, female   DOB: 1941/07/22, 80 y.o.   MRN: [id number] BP (!) 134/57 (BP Location: Left Arm)   Pulse 87   Temp (!) 97.3 F (36.3 C) (Oral)   Resp 16   Ht 5\' 11"  (1.803 m)   Wt 81.8 kg   LMP  (LMP Unknown)   SpO2 95%   BMI 25.15 kg/m  Alert and oriented x 4 Speech is clear, fluent Moving all extremities well Pupils are equal round and reactive to light Will transfer to floor

## 2021-08-06 NOTE — Progress Notes (Signed)
Inpatient Rehab Admissions Coordinator:   Per therapy recommendations  patient was screened for CIR candidacy by Clemens Catholic, MS, CCC-SLP. At this time, Pt. Appears to demonstrate medical necessity, functional decline, and ability to tolerate intensity of CIR. Pt. is a potential candidate for CIR. I will place  order for rehab consult per protocol for full assessment. Note that Pt. Is already min A and CIR has limited beds this week and pt. Is already ambulating with min A. If pt. Continues to progress, she may be able to go home instead of CIR. Please contact me any with questions.Clemens Catholic, [place], [facility] Admissions Coordinator  [phone] ([place]) [phone] (office)

## 2021-08-07 DIAGNOSIS — S065X9A Traumatic subdural hemorrhage with loss of consciousness of unspecified duration, initial encounter: Secondary | ICD-10-CM | POA: Diagnosis not present

## 2021-08-07 LAB — GLUCOSE, CAPILLARY
Glucose-Capillary: 141 mg/dL — ABNORMAL HIGH (ref 70–99)
Glucose-Capillary: 118 mg/dL — ABNORMAL HIGH (ref 70–99)

## 2021-08-07 MED ORDER — INSULIN ASPART 100 UNIT/ML IJ SOLN: 0.0000 [IU] | Freq: Three times a day (TID) | INTRAMUSCULAR | Status: DC

## 2021-08-07 NOTE — Progress Notes (Signed)
Patient ID: Mary Higgins, female   DOB: [birth year], 80 y.o.   MRN: [id number] BP (!) 136/94 (BP Location: Right Arm)   Pulse (!) 104   Temp 98 F (36.7 C) (Oral)   Resp 18   Ht 5\' 11"  (1.803 m)   Wt 81.8 kg   LMP  (LMP Unknown)   SpO2 98%   BMI 25.15 kg/m  Alert and oriented x 4 Speech is clear, fluent Perrl, full eom Moving all extremities Wound is clean, dry, no signs of infection Awaiting transfer to floor

## 2021-08-07 NOTE — Progress Notes (Signed)
PROGRESS NOTE  Mary Higgins:[id number] DOB: 1940-11-29 DOA: 08/03/2021 PCP: [name], NP  Brief History   Patient is an 80 yr old woman who presented to ED with complaints of a fall at home on 08/03/2021 and an episode of syncope. After she came around she was found to have a headache that was occipital amd wprse than her usual headache. It was left sided and daily for 1-2 weeks. Speech was slow on presentation. She did not have lateralizing signs. Head CT revealed 1. Large right holo hemispheric mixed density subdural collection measures up to 2.3 cm, with acute hemorrhage as well as low-density components. There is mass effect on the subjacent brain parenchyma with 9 mm right to left midline shift. Findings are new from CT earlier this month. 2. Mass effect on the lateral ventricles with decreased ventricular size from prior exam, particularly the right lateral ventricle. 3. Stable positioning of ventriculoperitoneal shunt catheter from a left frontal approach. 4. Stable left parafalcine calcified meningioma.  Neurosurgery was consulted and the patient undewent craniotomy with evacuation of hematoma and ligation of VPS on 08/04/2021. She was left intubated following the procedure. PCCM was consulted.  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  The patient was transferred to the care of [facility] on 08/07/2021.  Consultants  Neurosurgery PCCM  Procedures  craniotomy with evacuation of hematoma and ligation of VPS on 08/04/2021  Antibiotics   Anti-infectives (From admission, onward)    Start     Dose/Rate Route Frequency Ordered Stop   08/04/21 2000  ceFAZolin (ANCEF) IVPB 2g/100 mL premix        2 g 200 mL/hr over 30 Minutes Intravenous Every 8 hours 08/04/21 1434 08/05/21 0625   08/04/21 1119  ceFAZolin (ANCEF) 2-4 GM/100ML-% IVPB       Note to Pharmacy: Renda Rolls   : cabinet  override      08/04/21 1119 08/04/21 1130      Subjective  The patient is resting comfortably. No new complaints. She is in good humor.  Objective   Vitals:  Vitals:   08/07/21 0800 08/07/21 1135  BP: (!) 136/94 (!) 115/58  Pulse: (!) 104   Resp:  12  Temp:  98.4 F (36.9 C)  SpO2: 98% 97%    Exam:  Constitutional:  The patient is awake, alert, and oriented x 3. Respiratory:  CTA bilaterally, no w/r/r.  Respiratory effort normal. No retractions or accessory muscle use Cardiovascular:  RRR, no m/r/g No LE extremity edema   Normal pedal pulses Abdomen:  Abdomen appears normal; no tenderness or masses No hernias No HSM Musculoskeletal:  Digits/nails BUE: no clubbing, cyanosis, petechiae, infection exam of joints, bones, muscles of at least one of following: head/neck, RUE, LUE, RLE, LLE   Skin:  No rashes, lesions, ulcers palpation of skin: no induration or nodules Neurologic:  CN 2-12 intact Sensation all 4 extremities intact Psychiatric:  Mental status Mood, affect appropriate Orientation to person, place, time  judgment and insight appear intact    I have personally reviewed the following:   Today's Data  Vitals  Lab Data    Micro Data  MRSA by PCR negative  Imaging  CT head  Cardiology Data  EKG  Scheduled Meds:  buPROPion  150 mg Oral Daily   Chlorhexidine Gluconate Cloth  6 each Topical Daily   docusate sodium  100 mg Oral  BID   heparin injection (subcutaneous)  5,000 Units Subcutaneous Q8H   levETIRAcetam  500 mg Oral BID   losartan  50 mg Oral Daily   metFORMIN  500 mg Oral Q breakfast   pantoprazole  40 mg Oral Q1200   Continuous Infusions:  Principal Problem:   Subdural hematoma (HCC) Active Problems:   Tobacco abuse   Primary hypertension   Type 2 diabetes mellitus with hyperglycemia, without long-term current use of insulin (HCC)   Syncope and collapse   Fall at home, initial encounter   Midline shift of brain    Pacemaker   S/P VP shunt   Meningioma (HCC)   History of cerebral aneurysm   Solitary pulmonary nodule   Subdural hemorrhage (HCC)   LOS: 3 days   A & P  Acute on chronic R sided SDH with midline shift, cerebral edema s/p burr hole evac S/p VPS ligation  Repeat CT scan showed improvement in midline shift and cerebral edema and subdural hematoma  Maintain SBP < 160 Cont keppra for sz ppx   Frequent falls PT/OT evaluation Fall precautions   HTN, controlled Cozaar PRN hydralazine, labetalol    DM2 FSBS and SSI. Continue metformin as at home.  I have seen and examined this patient myself. I have spent 34 minutes in her evaluation and care.  DVT prophylaxis: Heparin Code Status: Full Code Family Communication: None available Disposition Plan: tbd    Ellwyn Ergle, DO [facility] Direct contact: see [email or web address]  7PM-7AM contact night coverage as above 08/07/2021, 3:29 PM  LOS: 3 days

## 2021-08-08 DIAGNOSIS — S065X9A Traumatic subdural hemorrhage with loss of consciousness of unspecified duration, initial encounter: Secondary | ICD-10-CM | POA: Diagnosis not present

## 2021-08-08 LAB — GLUCOSE, CAPILLARY
Glucose-Capillary: 116 mg/dL — ABNORMAL HIGH (ref 70–99)
Glucose-Capillary: 83 mg/dL (ref 70–99)
Glucose-Capillary: 116 mg/dL — ABNORMAL HIGH (ref 70–99)
Glucose-Capillary: 108 mg/dL — ABNORMAL HIGH (ref 70–99)

## 2021-08-08 LAB — BASIC METABOLIC PANEL
Anion gap: 7 (ref 5–15)
BUN: 7 mg/dL — ABNORMAL LOW (ref 8–23)
CO2: 26 mmol/L (ref 22–32)
Calcium: 8.5 mg/dL — ABNORMAL LOW (ref 8.9–10.3)
Chloride: 104 mmol/L (ref 98–111)
Creatinine, Ser: 0.56 mg/dL (ref 0.44–1.00)
GFR, Estimated: >60 mL/min (ref >60)
Glucose, Bld: 109 mg/dL — ABNORMAL HIGH (ref 70–99)
Potassium: 3.1 mmol/L — ABNORMAL LOW (ref 3.5–5.1)
Sodium: 137 mmol/L (ref 135–145)

## 2021-08-08 LAB — CBC WITH DIFFERENTIAL/PLATELET
Abs Immature Granulocytes: 0.01 10*3/uL (ref 0.00–0.07)
Basophils Absolute: 0.1 10*3/uL (ref 0.0–0.1)
Basophils Relative: 1 %
Eosinophils Absolute: 0.1 10*3/uL (ref 0.0–0.5)
Eosinophils Relative: 2 %
HCT: 29.1 % — ABNORMAL LOW (ref 36.0–46.0)
Hemoglobin: 9.6 g/dL — ABNORMAL LOW (ref 12.0–15.0)
Immature Granulocytes: 0 %
Lymphocytes Relative: 38 %
Lymphs Abs: 2.3 10*3/uL (ref 0.7–4.0)
MCH: 29.8 pg (ref 26.0–34.0)
MCHC: 33 g/dL (ref 30.0–36.0)
MCV: 90.4 fL (ref 80.0–100.0)
Monocytes Absolute: 0.5 10*3/uL (ref 0.1–1.0)
Monocytes Relative: 8 %
Neutro Abs: 3.1 10*3/uL (ref 1.7–7.7)
Neutrophils Relative %: 51 %
Platelets: 286 10*3/uL (ref 150–400)
RBC: 3.22 MIL/uL — ABNORMAL LOW (ref 3.87–5.11)
RDW: 14.6 % (ref 11.5–15.5)
WBC: 6 10*3/uL (ref 4.0–10.5)
nRBC: 0 % (ref 0.0–0.2)

## 2021-08-08 MED ORDER — POTASSIUM CHLORIDE 10 MEQ/100ML IV SOLN
10.0000 meq | INTRAVENOUS | Status: AC
  Administered 2021-08-08 (×4): 10 meq via INTRAVENOUS
  Filled 2021-08-08: qty 100

## 2021-08-08 NOTE — Progress Notes (Signed)
PROGRESS NOTE  SIDNI FUSCO ASN:[id number] DOB: 1941/05/26 DOA: 08/03/2021 PCP: [name], NP  Brief History   Patient is an 80 yr old woman who presented to ED with complaints of a fall at home on 08/03/2021 and an episode of syncope. After she came around she was found to have a headache that was occipital amd wprse than her usual headache. It was left sided and daily for 1-2 weeks. Speech was slow on presentation. She did not have lateralizing signs. Head CT revealed 1. Large right holo hemispheric mixed density subdural collection measures up to 2.3 cm, with acute hemorrhage as well as low-density components. There is mass effect on the subjacent brain parenchyma with 9 mm right to left midline shift. Findings are new from CT earlier this month. 2. Mass effect on the lateral ventricles with decreased ventricular size from prior exam, particularly the right lateral ventricle. 3. Stable positioning of ventriculoperitoneal shunt catheter from a left frontal approach. 4. Stable left parafalcine calcified meningioma.  Neurosurgery was consulted and the patient undewent craniotomy with evacuation of hematoma and ligation of VPS on 08/04/2021. She was left intubated following the procedure. PCCM was consulted.  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  The patient was transferred to the care of [facility] on 08/07/2021.  Will transfer to floor.  Plan is for discharge to CIR vs home.  Consultants  Neurosurgery PCCM  Procedures  craniotomy with evacuation of hematoma and ligation of VPS on 08/04/2021  Antibiotics   Anti-infectives (From admission, onward)    Start     Dose/Rate Route Frequency Ordered Stop   08/04/21 2000  ceFAZolin (ANCEF) IVPB 2g/100 mL premix        2 g 200 mL/hr over 30 Minutes Intravenous Every 8 hours 08/04/21 1434 08/05/21 0625   08/04/21 1119  ceFAZolin (ANCEF) 2-4  GM/100ML-% IVPB       Note to Pharmacy: Renda Rolls   : cabinet override      08/04/21 1119 08/04/21 1130      Subjective  The patient is resting comfortably. No new complaints. She is complaining of a headache today. Objective   Vitals:  Vitals:   08/08/21 1100 08/08/21 1200  BP: (!) 115/92 131/85  Pulse: 75   Resp: 17 15  Temp:  98 F (36.7 C)  SpO2: 95%     Exam:  Constitutional:  The patient is awake, alert, and oriented x 3. Respiratory:  CTA bilaterally, no w/r/r.  Respiratory effort normal. No retractions or accessory muscle use Cardiovascular:  RRR, no m/r/g No LE extremity edema   Normal pedal pulses Abdomen:  Abdomen appears normal; no tenderness or masses No hernias No HSM Musculoskeletal:  Digits/nails BUE: no clubbing, cyanosis, petechiae, infection exam of joints, bones, muscles of at least one of following: head/neck, RUE, LUE, RLE, LLE   Skin:  No rashes, lesions, ulcers palpation of skin: no induration or nodules Neurologic:  CN 2-12 intact Sensation all 4 extremities intact Psychiatric:  Mental status Mood, affect appropriate Orientation to person, place, time  judgment and insight appear intact    I have personally reviewed the following:   Today's Data  Vitals  Lab Data  BMP, CBC  Micro Data  MRSA by PCR negative  Imaging  CT head  Cardiology Data  EKG  Scheduled Meds:  buPROPion  150 mg Oral Daily   Chlorhexidine Gluconate  Cloth  6 each Topical Daily   docusate sodium  100 mg Oral BID   heparin injection (subcutaneous)  5,000 Units Subcutaneous Q8H   insulin aspart  0-6 Units Subcutaneous TID WC   levETIRAcetam  500 mg Oral BID   losartan  50 mg Oral Daily   metFORMIN  500 mg Oral Q breakfast   pantoprazole  40 mg Oral Q1200   Continuous Infusions:  potassium chloride 10 mEq (08/08/21 1409)    Principal Problem:   Subdural hematoma (HCC) Active Problems:   Tobacco abuse   Primary hypertension   Type 2  diabetes mellitus with hyperglycemia, without long-term current use of insulin (HCC)   Syncope and collapse   Fall at home, initial encounter   Midline shift of brain   Pacemaker   S/P VP shunt   Meningioma ([place])   History of cerebral aneurysm   Solitary pulmonary nodule   Subdural hemorrhage (HCC)   LOS: 4 days   A & P  Acute on chronic R sided SDH with midline shift, cerebral edema s/p burr hole evac S/p VPS ligation  Repeat CT scan showed improvement in midline shift and cerebral edema and subdural hematoma  Maintain SBP < 160 Cont keppra for sz ppx  Hypokalemia: Supplement. Continue to monitor.  Frequent falls PT/OT evaluation Fall precautions   HTN, controlled Cozaar PRN hydralazine, labetalol    DM2 FSBS and SSI. Continue metformin as at home.  I have seen and examined this patient myself. I have spent 32 minutes in her evaluation and care.  DVT prophylaxis: Heparin Code Status: Full Code Family Communication: None available Disposition Plan: tbd    Azavion Bouillon, DO [facility] Direct contact: see [email or web address]  7PM-7AM contact night coverage as above 08/08/2021, 2:20 PM  LOS: 3 days

## 2021-08-08 NOTE — Progress Notes (Signed)
Physical Therapy Treatment Patient Details Name: Mary Higgins MRN: [id number] DOB: 12/14/1940 Today's Date: 08/08/2021   History of Present Illness 80 yo female presenting to ED on 9/22 after a mechnical fall. CT head showing large acute right subdural hematoma. S/p R burr hole evacuation on 9/22.   Intubated 9/22-9/23. PMH including anxiety, depression, Blepharospasm, breast cancer s/p lumpectomy, bilateral carotis bruit, DM, fuchs' corneal dystrophy, HTN, intracranial bleed s/p coiling by Dr Estanislado Pandy and shunt placement by Dr Vertell Limber (2003), and neuracardiogenic syncope.    PT Comments    Pt with improved ambulation tolerance however continues to present with decreased insight to safety and deficits, impaired balance, impulsivity, increased falls risk as pt with poor walker management and is unsafe without AD. Pt with 3 falls in the last month. Continue to recommend CIR upon d/c to address mentioned deficits to improve safety and independence prior to returning home with family.    Recommendations for follow up therapy are one component of a multi-disciplinary discharge planning process, led by the attending physician.  Recommendations may be updated based on patient status, additional functional criteria and insurance authorization.  Follow Up Recommendations  CIR (pending progression)     Equipment Recommendations  Rolling walker with 5" wheels    Recommendations for Other Services Rehab consult     Precautions / Restrictions Precautions Precautions: Fall Restrictions Weight Bearing Restrictions: No     Mobility  Bed Mobility Overal bed mobility: Needs Assistance Bed Mobility: Supine to Sit     Supine to sit: Min guard;HOB elevated     General bed mobility comments: HOB elevated and use of bed rails.    Transfers Overall transfer level: Needs assistance Equipment used: Rolling walker (2 wheeled) Transfers: Sit to/from Stand Sit to Stand: Min assist          General transfer comment: minA to steady upon standing, freq verbal cues for safe hand placement  Ambulation/Gait Ambulation/Gait assistance: Min assist Gait Distance (Feet): 120 Feet Assistive device: Rolling walker (2 wheeled) Gait Pattern/deviations: Step-through pattern;Decreased stride length Gait velocity: dec   General Gait Details: directional verbal cues, pt with hard time following directional verbal cues but was able to find her room with verbal cues to look at the room numbers, minA for walker management around turns and obstacles and when backing up as pt kept stratling the walker   Stairs             Wheelchair Mobility    Modified Rankin (Stroke Patients Only) Modified Rankin (Stroke Patients Only) Pre-Morbid Rankin Score: Slight disability Modified Rankin: Moderate disability     Balance Overall balance assessment: Needs assistance Sitting-balance support: No upper extremity supported;Feet supported Sitting balance-Leahy Scale: Fair     Standing balance support: Bilateral upper extremity supported;During functional activity Standing balance-Leahy Scale: Poor Standing balance comment: reliant on UE support and external assist                            Cognition Arousal/Alertness: Awake/alert Behavior During Therapy: Impulsive Overall Cognitive Status: Impaired/Different from baseline Area of Impairment: Attention;Memory;Following commands;Safety/judgement;Awareness;Problem solving                   Current Attention Level: Sustained Memory: Decreased short-term memory Following Commands: Follows one step commands consistently;Follows multi-step commands inconsistently Safety/Judgement: Decreased awareness of deficits;Decreased awareness of safety Awareness: Emergent Problem Solving: Slow processing;Difficulty sequencing;Requires verbal cues;Requires tactile cues General Comments: when washing hands pt used  soap and never used  the water to rinse, just used the towel to "dry", pt mildly impulsive      Exercises      General Comments General comments (skin integrity, edema, etc.): VSS, assisted to the bathroom, min guard for safety as pt almost missed the toliet, supervision for hygiene, verbal cues to use water to rinse of soap when washing hands      Pertinent Vitals/Pain Pain Assessment: No/denies pain    Home Living                      Prior Function            PT Goals (current goals can now be found in the care plan section) Acute Rehab PT Goals Patient Stated Goal: home today Progress towards PT goals: Progressing toward goals    Frequency    Min 4X/week      PT Plan Current plan remains appropriate    Co-evaluation              AM-PAC PT "6 Clicks" Mobility   Outcome Measure  Help needed turning from your back to your side while in a flat bed without using bedrails?: A Little Help needed moving from lying on your back to sitting on the side of a flat bed without using bedrails?: A Little Help needed moving to and from a bed to a chair (including a wheelchair)?: A Little Help needed standing up from a chair using your arms (e.g., wheelchair or bedside chair)?: A Little Help needed to walk in hospital room?: A Little Help needed climbing 3-5 steps with a railing? : A Lot 6 Click Score: 17    End of Session Equipment Utilized During Treatment: Gait belt Activity Tolerance: Patient tolerated treatment well Patient left: in chair;with call bell/phone within reach;with chair alarm set;with family/visitor present Nurse Communication: Mobility status PT Visit Diagnosis: Unsteadiness on feet (R26.81);Muscle weakness (generalized) (M62.81);History of falling (Z91.81);Difficulty in walking, not elsewhere classified (R26.2)     Time: 3086-5784 PT Time Calculation (min) (ACUTE ONLY): 21 min  Charges:  $Gait Training: 8-22 mins                     Kittie Plater, PT,  DPT Acute Rehabilitation Services Pager #: [phone] Office #: [phone]    Berline Lopes 08/08/2021, 2:35 PM

## 2021-08-08 NOTE — Progress Notes (Signed)
Neurosurgery Service Progress Note  Subjective: No acute events overnight, up and walking around with PT during my rounds this morning.   Objective: Vitals:   08/08/21 0400 08/08/21 0500 08/08/21 0600 08/08/21 0700  BP:  (!) 160/79 (!) 147/75 (!) 151/63  Pulse:      Resp: 15 20 (!) 21 14  Temp: 97.9 F (36.6 C)     TempSrc: Oral     SpO2:      Weight:      Height:        Physical Exam: Walking with wheeled walker, AO, incisions c/d/I x2, Fcx4 without preference   Assessment & Plan: 80 y.o. woman s/p burr hole evacuation of SDH and shunt ligation, recovering well.  -no change in neurosurgical plan of care -can be floor status from my standpoint, CIR dispo pending  Judith Part  08/08/21 8:00 AM

## 2021-08-09 ENCOUNTER — Inpatient Hospital Stay (HOSPITAL_COMMUNITY): Payer: Medicare Other

## 2021-08-09 DIAGNOSIS — S065X9A Traumatic subdural hemorrhage with loss of consciousness of unspecified duration, initial encounter: Secondary | ICD-10-CM | POA: Diagnosis not present

## 2021-08-09 LAB — GLUCOSE, CAPILLARY
Glucose-Capillary: 107 mg/dL — ABNORMAL HIGH (ref 70–99)
Glucose-Capillary: 81 mg/dL (ref 70–99)
Glucose-Capillary: 94 mg/dL (ref 70–99)
Glucose-Capillary: 96 mg/dL (ref 70–99)

## 2021-08-09 NOTE — Progress Notes (Signed)
Physical Therapy Treatment Patient Details Name: Mary Higgins MRN: [id number] DOB: Mar 26, 1941 Today's Date: 08/09/2021   History of Present Illness 80 yo female presenting to ED on 9/22 after a mechnical fall. CT head showing large acute right subdural hematoma. S/p R burr hole evacuation on 9/22.   Intubated 9/22-9/23. PMH including anxiety, depression, Blepharospasm, breast cancer s/p lumpectomy, bilateral carotis bruit, DM, fuchs' corneal dystrophy, HTN, intracranial bleed s/p coiling by Dr Estanislado Pandy and shunt placement by Dr Vertell Limber (2003), and neuracardiogenic syncope.    PT Comments    Pt continues to progress functionally. Pt requires RW for safe ambulation as pt with significant falls risk without AD as pt was relying on PT to maintain balance. Pt safely completed stair negotiation with R HR and minA to enter home. Pt remains to have some mild confusion ie. What button to press to get RN, she knew she was at [facility] but didn't know it was in [place]. Spoke with dtr who couldn't speak to her mothers previous level of cognition but suspect pt with some mild cognitive deficits PTA. Spoke with daughter who reports between her and pt's son they can provide 24/7 assist and are comfortable taking pt home with HHPT and use of RW. Dtr aware she has to walk with patient. Acute PT to cont to follow.   Recommendations for follow up therapy are one component of a multi-disciplinary discharge planning process, led by the attending physician.  Recommendations may be updated based on patient status, additional functional criteria and insurance authorization.  Follow Up Recommendations  Home health PT;Supervision/Assistance - 24 hour     Equipment Recommendations  Rolling walker with 5" wheels    Recommendations for Other Services       Precautions / Restrictions Precautions Precautions: Fall Restrictions Weight Bearing Restrictions: No     Mobility  Bed Mobility               General bed  mobility comments: pt up in chair upon PT arrival    Transfers Overall transfer level: Needs assistance Equipment used: Rolling walker (2 wheeled) Transfers: Sit to/from Stand Sit to Stand: Min guard         General transfer comment: increased time but steady, verbal cues for safe  hand placement  Ambulation/Gait Ambulation/Gait assistance: Min guard;Min assist Gait Distance (Feet): 150 Feet (x1, 60x1) Assistive device: None;1 person hand held assist;Rolling walker (2 wheeled) Gait Pattern/deviations: Step-to pattern;Decreased stride length;Narrow base of support Gait velocity: dec   General Gait Details: attempted to amb without AD, pt with short steps, very narrow base of support and leaning into therapist on L side for balance, pt then given HHA on L side in which pt was significantly ustilizing. Pt then given RW, pt min guard, increased step height and length and significantly more steady, pt agreed. verbal cues for safe walker management when turning and backing up to chair   Stairs Stairs: Yes Stairs assistance: Min assist Stair Management: One rail Right;Alternating pattern Number of Stairs: 12 General stair comments: encouraged step to however pt complete reciprocally, pt used bilat hands on R hand rail, educated on sideways however pt continued moving forward   Wheelchair Mobility    Modified Rankin (Stroke Patients Only) Modified Rankin (Stroke Patients Only) Pre-Morbid Rankin Score: Slight disability Modified Rankin: Moderate disability     Balance Overall balance assessment: Needs assistance Sitting-balance support: No upper extremity supported Sitting balance-Leahy Scale: Good Sitting balance - Comments: able to don socks without LOB  Standing balance support: Single extremity supported Standing balance-Leahy Scale: Poor Standing balance comment: reliant on UE support                            Cognition Arousal/Alertness:  Awake/alert Behavior During Therapy: Impulsive Overall Cognitive Status: Impaired/Different from baseline Area of Impairment: Orientation;Attention;Memory;Following commands;Awareness;Safety/judgement                 Orientation Level: Disoriented to;Place (stated [place] but didn't know it was in [place]) Current Attention Level: Focused Memory: Decreased short-term memory Following Commands: Follows one step commands consistently;Follows multi-step commands inconsistently Safety/Judgement: Decreased awareness of deficits;Decreased awareness of safety (impulsive, need chair alarm) Awareness: Emergent Problem Solving: Slow processing;Requires verbal cues;Requires tactile cues General Comments: pt with difficulty figuring out what button to press to call RN, pt re-educated with good recall      Exercises      General Comments General comments (skin integrity, edema, etc.): HR inc to 121bpm on stairs, BP 178/94 s/p amb, RN aware      Pertinent Vitals/Pain Pain Assessment: No/denies pain    Home Living                      Prior Function            PT Goals (current goals can now be found in the care plan section) Acute Rehab PT Goals Patient Stated Goal: home today Progress towards PT goals: Progressing toward goals    Frequency    Min 4X/week      PT Plan Discharge plan needs to be updated    Co-evaluation              AM-PAC PT "6 Clicks" Mobility   Outcome Measure  Help needed turning from your back to your side while in a flat bed without using bedrails?: A Little Help needed moving from lying on your back to sitting on the side of a flat bed without using bedrails?: A Little Help needed moving to and from a bed to a chair (including a wheelchair)?: A Little Help needed standing up from a chair using your arms (e.g., wheelchair or bedside chair)?: A Little Help needed to walk in hospital room?: A Little Help needed climbing 3-5 steps  with a railing? : A Little 6 Click Score: 18    End of Session Equipment Utilized During Treatment: Gait belt Activity Tolerance: Patient tolerated treatment well Patient left: in chair;with call bell/phone within reach;with chair alarm set Nurse Communication: Mobility status PT Visit Diagnosis: Unsteadiness on feet (R26.81);Muscle weakness (generalized) (M62.81);History of falling (Z91.81);Difficulty in walking, not elsewhere classified (R26.2)     Time: 2751-7001 PT Time Calculation (min) (ACUTE ONLY): 26 min  Charges:  $Gait Training: 23-37 mins                     Kittie Plater, PT, DPT Acute Rehabilitation Services Pager #: [phone] Office #: [phone]    Berline Lopes 08/09/2021, 8:26 AM

## 2021-08-09 NOTE — Progress Notes (Signed)
Inpatient Rehabilitation Admissions Coordinator   I met at bedside with patient and discussed her progress with Caryl Pina with PT. Current recommendation is for [facility] and we await further assessment of OT. I will follow up.  Danne Baxter, RN, MSN Rehab Admissions Coordinator [phone] 08/09/2021 10:49 AM

## 2021-08-09 NOTE — Progress Notes (Signed)
PROGRESS NOTE  Mary Higgins GYI:[id number] DOB: [birth year] DOA: 08/03/2021 PCP: [name], NP  Brief History   Patient is an 80 yr old woman who presented to ED with complaints of a fall at home on 08/03/2021 and an episode of syncope. After she came around she was found to have a headache that was occipital amd wprse than her usual headache. It was left sided and daily for 1-2 weeks. Speech was slow on presentation. She did not have lateralizing signs. Head CT revealed 1. Large right holo hemispheric mixed density subdural collection measures up to 2.3 cm, with acute hemorrhage as well as low-density components. There is mass effect on the subjacent brain parenchyma with 9 mm right to left midline shift. Findings are new from CT earlier this month. 2. Mass effect on the lateral ventricles with decreased ventricular size from prior exam, particularly the right lateral ventricle. 3. Stable positioning of ventriculoperitoneal shunt catheter from a left frontal approach. 4. Stable left parafalcine calcified meningioma.  Neurosurgery was consulted and the patient undewent craniotomy with evacuation of hematoma and ligation of VPS on 08/04/2021. She was left intubated following the procedure. PCCM was consulted.  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  The patient was transferred to the care of [facility] on 08/07/2021.  Consultants  Neurosurgery PCCM  Procedures  craniotomy with evacuation of hematoma and ligation of VPS on 08/04/2021  Antibiotics   Anti-infectives (From admission, onward)    Start     Dose/Rate Route Frequency Ordered Stop   08/04/21 2000  ceFAZolin (ANCEF) IVPB 2g/100 mL premix        2 g 200 mL/hr over 30 Minutes Intravenous Every 8 hours 08/04/21 1434 08/05/21 0625   08/04/21 1119  ceFAZolin (ANCEF) 2-4 GM/100ML-% IVPB       Note to Pharmacy: Renda Rolls   : cabinet  override      08/04/21 1119 08/04/21 1130      Subjective  The patient is resting comfortably. She complains that her speech is more difficult today.  Objective   Vitals:  Vitals:   08/09/21 1500 08/09/21 1600  BP: 140/67   Pulse:    Resp: (!) 21 18  Temp:  97.7 F (36.5 C)  SpO2:      Exam:  Constitutional:  The patient is awake, alert, and oriented x 3. No acute distress Respiratory:  CTA bilaterally, no w/r/r.  Respiratory effort normal. No retractions or accessory muscle use Cardiovascular:  RRR, no m/r/g No LE extremity edema   Normal pedal pulses Abdomen:  Abdomen appears normal; no tenderness or masses No hernias No HSM Musculoskeletal:  Digits/nails BUE: no clubbing, cyanosis, petechiae, infection exam of joints, bones, muscles of at least one of following: head/neck, RUE, LUE, RLE, LLE   Skin:  No rashes, lesions, ulcers palpation of skin: no induration or nodules Neurologic:  Speech is markedly more difficult to understand. More thick. The patient also remarks that her speech is more difficult today. CN 2-12 intact Sensation all 4 extremities intact Psychiatric:  Mental status Mood, affect appropriate Orientation to person, place, time  judgment and insight appear intact    I have personally reviewed the following:   Today's Data  Vitals  Lab Data  BMP, CBC  Micro Data  MRSA by PCR negative  Imaging  CT head 08/05/2021 CT head 08/09/2021 - Today's CT has demonstrated a  slight increase in size of hematoma as well a change in density of blood products that suggests a more acute bleed than previous.  Cardiology Data  EKG  Scheduled Meds:  buPROPion  150 mg Oral Daily   Chlorhexidine Gluconate Cloth  6 each Topical Daily   docusate sodium  100 mg Oral BID   heparin injection (subcutaneous)  5,000 Units Subcutaneous Q8H   insulin aspart  0-6 Units Subcutaneous TID WC   levETIRAcetam  500 mg Oral BID   losartan  50 mg Oral Daily    metFORMIN  500 mg Oral Q breakfast   pantoprazole  40 mg Oral Q1200    Principal Problem:   Subdural hematoma (HCC) Active Problems:   Tobacco abuse   Primary hypertension   Type 2 diabetes mellitus with hyperglycemia, without long-term current use of insulin (HCC)   Syncope and collapse   Fall at home, initial encounter   Midline shift of brain   Pacemaker   S/P VP shunt   Meningioma ([place])   History of cerebral aneurysm   Solitary pulmonary nodule   Subdural hemorrhage (HCC)   LOS: 5 days   A & P  Acute on chronic R sided SDH with midline shift, cerebral edema s/p burr hole evac S/p VPS ligation  Repeat CT scan was performed today due to changes in the patient's speech. Today's CT has demonstrated a slight increase in size of hematoma as well a change in density of blood products that suggests a more acute bleed than previous. I have discussed these changes with Dr. Zada Finders. We will defer patient's discharge to allow more time to observe her in light of these changes. I have discussed this with the patient's daughter over the phone.   Maintain SBP < 160 Cont keppra for sz ppx  Hypokalemia: Supplement. Continue to monitor.  Frequent falls PT/OT evaluation Fall precautions   HTN, controlled Cozaar PRN hydralazine, labetalol    DM2 FSBS and SSI. Continue metformin as at home.  I have seen and examined this patient myself. I have spent 42 minutes in her evaluation and care.  DVT prophylaxis: Heparin Code Status: Full Code Family Communication: None available Disposition Plan: tbd    Desmen Schoffstall, DO [facility] Direct contact: see [email or web address]  7PM-7AM contact night coverage as above 08/09/2021, 5:16 PM  LOS: 3 days

## 2021-08-09 NOTE — Progress Notes (Signed)
Neurosurgery Service Progress Note  Subjective: No acute events overnight, no complaints this morning  Objective: Vitals:   08/09/21 0600 08/09/21 0700 08/09/21 0800 08/09/21 1157  BP: (!) 160/69 (!) 140/98    Pulse:  77    Resp:  15    Temp:   97.9 F (36.6 C) 97.6 F (36.4 C)  TempSrc:   Oral Oral  SpO2:  93%    Weight:      Height:        Physical Exam: Aox3, Fcx4 without preference  Assessment & Plan: 80 y.o. woman s/p burr hole evacuation of SDH and shunt ligation, recovering well.  -no change in neurosurgical plan of care -upgraded by PT to HHPT, pt would like to go home, I'm okay with her being discharged home today w/ HHPT  Judith Part  08/09/21 1:58 PM

## 2021-08-10 ENCOUNTER — Inpatient Hospital Stay (HOSPITAL_COMMUNITY): Payer: Medicare Other

## 2021-08-10 DIAGNOSIS — S065X9A Traumatic subdural hemorrhage with loss of consciousness of unspecified duration, initial encounter: Secondary | ICD-10-CM | POA: Diagnosis not present

## 2021-08-10 LAB — GLUCOSE, CAPILLARY
Glucose-Capillary: 103 mg/dL — ABNORMAL HIGH (ref 70–99)
Glucose-Capillary: 99 mg/dL (ref 70–99)
Glucose-Capillary: 155 mg/dL — ABNORMAL HIGH (ref 70–99)

## 2021-08-10 MED ORDER — POLYETHYLENE GLYCOL 3350 17 G PO PACK
17.0000 g | PACK | Freq: Every day | ORAL | 0 refills | Status: DC | PRN
Start: 2021-08-10 — End: 2022-03-03

## 2021-08-10 NOTE — Progress Notes (Signed)
OT NOTE    08/10/21 1100  OT Visit Information  Last OT Received On 08/10/21  Assistance Needed +1  History of Present Illness 80 yo female presenting to ED on 9/22 after a mechnical fall. CT head showing large acute right subdural hematoma. S/p R burr hole evacuation on 9/22.   Intubated 9/22-9/23. PMH including anxiety, depression, Blepharospasm, breast cancer s/p lumpectomy, bilateral carotis bruit, DM, fuchs' corneal dystrophy, HTN, intracranial bleed s/p coiling by Dr Estanislado Pandy and shunt placement by Dr Vertell Limber (2003), and neuracardiogenic syncope.  Precautions  Precautions Fall  Pain Assessment  Pain Assessment No/denies pain  Cognition  Arousal/Alertness Awake/alert  Behavior During Therapy Impulsive  Overall Cognitive Status Impaired/Different from baseline  General Comments pt states "I dont feel like i know what is going on half the time" Pt confirms feeling confused and poor recall of information  Upper Extremity Assessment  Upper Extremity Assessment Overall WFL for tasks assessed  ADL  Overall ADL's  Needs assistance/impaired  Grooming Wash/dry hands;Standing;Min guard  Lower Body Dressing Min guard;Bed level  Lower Body Dressing Details (indicate cue type and reason) figure 4 cross supine  Toilet Transfer Minimal assistance;Ambulation;RW;Regular Toilet  Toileting- Water quality scientist and Hygiene Min guard;Sitting/lateral lean  Toileting - Clothing Manipulation Details (indicate cue type and reason) pt reaching back to front  Functional mobility during ADLs Minimal assistance;Rolling walker  Bed Mobility  Overal bed mobility Modified Independent  General bed mobility comments exiting on the Left side with hOB <20 degrees  Balance  Overall balance assessment Needs assistance  Sitting-balance support Bilateral upper extremity supported;Feet supported  Sitting balance-Leahy Scale Good  Standing balance support Bilateral upper extremity supported;During functional activity   Standing balance-Leahy Scale Fair  Transfers  Overall transfer level Needs assistance  Equipment used Rolling walker (2 wheeled)  Transfers Sit to/from Stand  Sit to Stand Min assist  General Comments  General comments (skin integrity, edema, etc.) vss  OT - End of Session  Equipment Utilized During Treatment Rolling walker;Gait belt  Activity Tolerance Patient tolerated treatment well  Patient left in bed;with call bell/phone within reach;with bed alarm set  Nurse Communication Mobility status;Precautions  OT Assessment/Plan  OT Plan Discharge plan needs to be updated  OT Visit Diagnosis Unsteadiness on feet (R26.81);Other abnormalities of gait and mobility (R26.89);Muscle weakness (generalized) (M62.81);History of falling (Z91.81)  OT Frequency (ACUTE ONLY) Min 2X/week  Follow Up Recommendations Home health OT  OT Equipment 3 in 1 bedside commode  AM-PAC OT "6 Clicks" Daily Activity Outcome Measure (Version 2)  Help from another person eating meals? 4  Help from another person taking care of personal grooming? 3  Help from another person toileting, which includes using toliet, bedpan, or urinal? 3  Help from another person bathing (including washing, rinsing, drying)? 3  Help from another person to put on and taking off regular upper body clothing? 3  Help from another person to put on and taking off regular lower body clothing? 3  6 Click Score 19  Progressive Mobility  What is the highest level of mobility based on the progressive mobility assessment? Level 5 (Walks with assist in room/hall) - Balance while stepping forward/back and can walk in room with assist - Complete  Mobility Ambulated with assistance in room  OT Goal Progression  Progress towards OT goals Progressing toward goals  Acute Rehab OT Goals  Patient Stated Goal home soon  OT Goal Formulation With patient  Time For Goal Achievement 08/20/21  Potential to Achieve Goals  Good  ADL Goals  Pt Will Perform  Grooming with supervision;standing  Pt Will Perform Lower Body Dressing with supervision;sit to/from stand  Pt Will Transfer to Toilet with supervision;ambulating;regular height toilet  Pt Will Perform Toileting - Clothing Manipulation and hygiene with supervision;sit to/from stand;sitting/lateral leans  Additional ADL Goal #1 Pt will demonstrate increased activity tolerance to perform three standing grooming tasks at sink with Supervision  OT Time Calculation  OT Start Time (ACUTE ONLY) 1145 (1145)  OT Stop Time (ACUTE ONLY) 1200  OT Time Calculation (min) 15 min   Brynn, OTR/L  Acute Rehabilitation Services Pager: [phone] Office: [phone] .

## 2021-08-10 NOTE — Progress Notes (Signed)
Physical Therapy Treatment Patient Details Name: Mary Higgins MRN: [id number] DOB: 11/26/1940 Today's Date: 08/10/2021   History of Present Illness 80 yo female presenting to ED on 9/22 after a mechnical fall. CT head showing large acute right subdural hematoma. S/p R burr hole evacuation on 9/22.   Intubated 9/22-9/23. PMH including anxiety, depression, Blepharospasm, breast cancer s/p lumpectomy, bilateral carotis bruit, DM, fuchs' corneal dystrophy, HTN, intracranial bleed s/p coiling by Dr Estanislado Pandy and shunt placement by Dr Vertell Limber (2003), and neuracardiogenic syncope.    PT Comments    Patient progressing towards physical therapy goals. Patient continues to be limited by impaired cognition and poor safety awareness. Reinforced use of RW at home for safety, patient verbalized understanding. Patient ambulated 200' with min guard and RW. Patient with STM deficits and difficulty remembering directions during mobility. D/c plan remains appropriate.     Recommendations for follow up therapy are one component of a multi-disciplinary discharge planning process, led by the attending physician.  Recommendations may be updated based on patient status, additional functional criteria and insurance authorization.  Follow Up Recommendations  Home health PT;Supervision/Assistance - 24 hour     Equipment Recommendations  Rolling Nevelyn Mellott with 5" wheels    Recommendations for Other Services       Precautions / Restrictions Precautions Precautions: Fall Restrictions Weight Bearing Restrictions: No     Mobility  Bed Mobility Overal bed mobility: Modified Independent             General bed mobility comments: exiting on the Left side with HOB <20 degrees    Transfers Overall transfer level: Needs assistance Equipment used: Rolling Ladarrion Telfair (2 wheeled) Transfers: Sit to/from Stand Sit to Stand: Min guard         General transfer comment: min guard for  safety  Ambulation/Gait Ambulation/Gait assistance: Min guard Gait Distance (Feet): 200 Feet Assistive device: Rolling Doren Kaspar (2 wheeled) Gait Pattern/deviations: Step-through pattern;Decreased stride length;Narrow base of support Gait velocity: decreased   General Gait Details: min guard for safety. Patient swinging door of room open, hitting RW but no LOB noted. Reinforced use of RW for safety at home   Stairs             Wheelchair Mobility    Modified Rankin (Stroke Patients Only) Modified Rankin (Stroke Patients Only) Pre-Morbid Rankin Score: Slight disability Modified Rankin: Moderate disability     Balance Overall balance assessment: Needs assistance Sitting-balance support: Bilateral upper extremity supported;Feet supported Sitting balance-Leahy Scale: Good     Standing balance support: Bilateral upper extremity supported;During functional activity Standing balance-Leahy Scale: Poor Standing balance comment: reliant on UE support                            Cognition Arousal/Alertness: Awake/alert Behavior During Therapy: Impulsive Overall Cognitive Status: Impaired/Different from baseline Area of Impairment: Orientation;Attention;Memory;Following commands;Awareness;Safety/judgement                 Orientation Level: Disoriented to;Place;Time Current Attention Level: Focused Memory: Decreased short-term memory Following Commands: Follows one step commands consistently;Follows multi-step commands inconsistently Safety/Judgement: Decreased awareness of deficits;Decreased awareness of safety Awareness: Emergent Problem Solving: Slow processing;Requires verbal cues;Requires tactile cues General Comments: STM deficits noted during session with patient given directions back to room and patient unable to recall      Exercises      General Comments General comments (skin integrity, edema, etc.): VSS      Pertinent Vitals/Pain Pain  Assessment: No/denies pain    Home Living                      Prior Function            PT Goals (current goals can now be found in the care plan section) Acute Rehab PT Goals Patient Stated Goal: home today PT Goal Formulation: With patient/family Time For Goal Achievement: 08/20/21 Potential to Achieve Goals: Good Progress towards PT goals: Progressing toward goals    Frequency    Min 4X/week      PT Plan Current plan remains appropriate    Co-evaluation              AM-PAC PT "6 Clicks" Mobility   Outcome Measure  Help needed turning from your back to your side while in a flat bed without using bedrails?: A Little Help needed moving from lying on your back to sitting on the side of a flat bed without using bedrails?: A Little Help needed moving to and from a bed to a chair (including a wheelchair)?: A Little Help needed standing up from a chair using your arms (e.g., wheelchair or bedside chair)?: A Little Help needed to walk in hospital room?: A Little Help needed climbing 3-5 steps with a railing? : A Little 6 Click Score: 18    End of Session Equipment Utilized During Treatment: Gait belt Activity Tolerance: Patient tolerated treatment well Patient left: in chair;with call bell/phone within reach;with chair alarm set Nurse Communication: Mobility status PT Visit Diagnosis: Unsteadiness on feet (R26.81);Muscle weakness (generalized) (M62.81);History of falling (Z91.81);Difficulty in walking, not elsewhere classified (R26.2)     Time: 7793-9030 PT Time Calculation (min) (ACUTE ONLY): 23 min  Charges:  $Gait Training: 23-37 mins                     Mary Higgins PT, DPT Acute Rehabilitation Services Pager [phone] Office [phone]    Mary Higgins 08/10/2021, 4:40 PM

## 2021-08-10 NOTE — Discharge Instructions (Signed)
Discharge Instructions  No restriction in activities, slowly increase your activity back to normal.   Your incision is closed with absorbable sutures. These will naturally fall off over the next 4-6 weeks. If they become bothersome or cause discomfort, apply some antibiotic ointment like bacitracin or neosporin on the sutures. This will soften them up and usually makes them more comfortable while they dissolve.  Okay to shower on the day of discharge. Be gentle when cleaning your incision. Use regular soap and water. If that is uncomfortable, try using baby shampoo. Do not submerge the wound under water for 2 weeks after surgery.  Follow up with Dr. Kely Dohn in 2 weeks after discharge. If you do not already have a discharge appointment, please call his office at [phone] to schedule a follow up appointment. If you have any concerns or questions, please call the office and let us know. 

## 2021-08-10 NOTE — Progress Notes (Addendum)
Neurosurgery Service Progress Note  Subjective: No acute events overnight, no complaints this morning, she doesn't recall the dysarthria but family says no further similar events overnight  Objective: Vitals:   08/10/21 0300 08/10/21 0400 08/10/21 0500 08/10/21 0754  BP: 115/79 128/73 113/66   Pulse: (!) 105 87 84   Resp: (!) 21 14 17    Temp:  98 F (36.7 C)  97.7 F (36.5 C)  TempSrc:  Oral  Oral  SpO2: 96% 92% 92%   Weight:      Height:        Physical Exam: Aox3, Fcx4 without preference  Assessment & Plan: 80 y.o. woman s/p burr hole evacuation of SDH and shunt ligation, recovering well.  -will repeat CTH today, if stable findings then okay to discharge home today -can d/c keppra  Mary Higgins  08/10/21 8:49 AM

## 2021-08-10 NOTE — Discharge Summary (Signed)
Physician Discharge Summary  Mary Higgins KDX:[id number] DOB: 07-Aug-1941 DOA: 08/03/2021  PCP: Mary Chandler, NP  Admit date: 08/03/2021 Discharge date: 08/10/2021  Time spent: 27 minutes  Recommendations for Outpatient Follow-up:  Needs outpatient follow-up with Dr. Venetia Higgins neurosurgery for bur hole reassessment Needs Chem-12, CBC in about 1 week  Discharge Diagnoses:  MAIN problem for hospitalization   Subdural hematoma status post bur hole  Please see below for itemized issues addressed in [place]- refer to other progress notes for clarity if needed  Discharge Condition: Improved  Diet recommendation: Heart healthy  Filed Weights   08/04/21 0948  Weight: 81.8 kg    History of present illness:  80 year old community dwelling white female, prior intracerebral hemorrhage 2000 treated with coiling, VP shunt placement, PPM, HTN, DM TY 2, remote breast cancer status postlumpectomy X RT had an episode of syncope headache slow speech and came to ED Found to have 2.3 cm subdural with 9 mm midline shift Neurosurgery saw the patient-patient was transiently intubated and was transferred on the ICU on 9/25  Hospital Course:  Acute superimposed on chronic right-sided subdural -Patient underwent successful bur hole placement with improvement but had mental status changes prompting repeat CT scan -CT head had been performed several times-patient was reassessed by neurosurgery and felt to be improved -Neurosurgery cleared patient for discharge subsequent to seeing the scan blood pressure should be well controlled -Patient has been recommended for home health PT OT and has all the equipment at home  Hypertension Patient was discharged on Cozaar prior dosing  DM TY 2 metformin was continued Blood sugars relatively well controlled during hospital stay   Discharge Exam: Vitals:   08/10/21 1144 08/10/21 1200  BP:  131/75  Pulse:  98  Resp:  16  Temp: 97.7 F (36.5 C)    SpO2:  97%    Subj on day of d/c   Awake coherent no distress no headache fever chills nausea vomiting  General Exam on discharge  EOMI NCAT no focal deficit moving 4 limbs equally CTA B no evidence of no rales no rhonchi S1-S2 no murmur Left side parietal region of head has bur hole wound power 5/5 sensory grossly intact  Discharge Instructions   Discharge Instructions     Diet - low sodium heart healthy   Complete by: As directed    Increase activity slowly   Complete by: As directed    No wound care   Complete by: As directed       Allergies as of 08/10/2021       Reactions   Oxaprozin Rash        Medication List     STOP taking these medications    aspirin EC 325 MG tablet       TAKE these medications    acetaminophen 500 MG tablet Commonly known as: TYLENOL Take 1,000 mg by mouth in the morning and at bedtime.   buPROPion 150 MG 12 hr tablet Commonly known as: WELLBUTRIN SR Take 1 tablet (150 mg total) by mouth daily.   clonazePAM 1 MG tablet Commonly known as: KLONOPIN Take 1 tablet (1 mg total) by mouth 3 (three) times daily as needed. What changed: reasons to take this   DRY EYES OP Place 1 drop into both eyes daily as needed (dry eyes).   losartan 100 MG tablet Commonly known as: COZAAR Take 1/2 tablet by mouth in the morning and Take 1/2 tablet by mouth in the evening. What changed:  how much to take how to take this when to take this   metFORMIN 500 MG tablet Commonly known as: GLUCOPHAGE TAKE 1 TABLET BY MOUTH WITH BREAKFAST AND WITH SUPPER FOR DIABETES What changed: See the new instructions.   ONE TOUCH ULTRA 2 w/Device Kit USE UP TO FOUR TIMES DAILY AS DIRECTED   pantoprazole 40 MG tablet Commonly known as: PROTONIX TAKE 1 TABLET(40 MG) BY MOUTH DAILY What changed: See the new instructions.   polyethylene glycol 17 g packet Commonly known as: MIRALAX / GLYCOLAX Take 17 g by mouth daily as needed for mild  constipation.       Allergies  Allergen Reactions   Oxaprozin Rash      The results of significant diagnostics from this hospitalization (including imaging, microbiology, ancillary and laboratory) are listed below for reference.    Significant Diagnostic Studies: DG Skull 1-3 Views  Result Date: 07/19/2021 CLINICAL DATA:  80 year old female with headaches, VP shunt. EXAM: SKULL - 1-3 VIEW COMPARISON:  Skull series 05/27/2007 and earlier. Head CT earlier today. FINDINGS: Left vertex approach ventriculostomy with partially radiolucent shunt reservoir over the left convexity. Shunt tubing continues inferiorly and is now calcified in the neck. No discontinuity identified. The shunt setting is visible on image #1. No acute osseous abnormality identified. IMPRESSION: Partially calcified CSF shunt with no discontinuity identified. Electronically Signed   By: Mary Higgins M.D.   On: 07/19/2021 07:15   DG Chest 1 View  Result Date: 08/04/2021 CLINICAL DATA:  80 year old female with cough and loss of consciousness. EXAM: CHEST  1 VIEW COMPARISON:  Chest radiographs 08/03/2021 and earlier. FINDINGS: Portable AP semi upright view at 0717 hours. More rotated to the left. Improved lung volumes. Stable cardiac size and mediastinal contours. Visualized tracheal air column is within normal limits. Allowing for portable technique the lungs are clear. No pneumothorax or pleural effusion. Stable right chest cardiac pacemaker, left chest shunt catheter. Negative visible bowel gas pattern. Chronic postoperative changes to the bilateral axilla, chest wall. IMPRESSION: No acute cardiopulmonary abnormality. Electronically Signed   By: Mary Higgins M.D.   On: 08/04/2021 07:33   DG Chest 1 View  Result Date: 07/19/2021 CLINICAL DATA:  80 year old female with headaches, VP shunt. EXAM: CHEST  1 VIEW COMPARISON:  Skull and neck radiographs today. Chest radiographs 06/01/2018 and earlier. FINDINGS: Portable AP supine view at 0704  hours. Stable appearance of the shunt tubing passing through the left chest from the neck and into the upper abdomen. Lung volumes and mediastinal contours remain normal. Allowing for portable technique the lungs are clear. Visualized tracheal air column is within normal limits. Stable right chest cardiac pacemaker, bilateral axillary surgical clips. IMPRESSION: 1. Stable, satisfactory appearance of the shunt tubing from the left neck to the upper abdomen. 2.  No acute cardiopulmonary abnormality. Electronically Signed   By: Mary Higgins M.D.   On: 07/19/2021 07:16   DG Chest 2 View  Result Date: 08/03/2021 CLINICAL DATA:  Unwitnessed fall this morning due to syncopal episode. EXAM: CHEST - 2 VIEW COMPARISON:  07/19/2021 FINDINGS: Cardiac pacemaker. Surgical clips in the axilla bilaterally. Catheter placed over the left mediastinum possibly a ventricular peritoneal shunt. Heart size and pulmonary vascularity are normal. Lungs are clear. No pleural effusions. No pneumothorax. Mediastinal contours appear intact. Calcification of the aorta. Old left rib fractures. IMPRESSION: No active cardiopulmonary disease. Electronically Signed   By: Lucienne Capers M.D.   On: 08/03/2021 19:36   DG Cervical Spine 1 View  Result Date: 07/19/2021 CLINICAL DATA:  80 year old female with headaches, VP shunt. EXAM: DG CERVICAL SPINE - 1 VIEW COMPARISON:  Head CT 0601 hours today. Skull series 05/27/2007 and earlier. FINDINGS: Left side partially calcified shunt tubing extends from the neck into the chest. No discontinuity identified. Superimposed bilateral cervical carotid calcified atherosclerosis. Right chest cardiac pacemaker partially visible. Negative visible upper lungs and mediastinal contour. IMPRESSION: 1. Left side shunt tubing is partially calcified but no discontinuity is identified as it extends into the chest. 2. Cervical carotid calcified atherosclerosis. Electronically Signed   By: Mary Higgins M.D.   On: 07/19/2021 07:14    DG Abd 1 View  Result Date: 07/19/2021 CLINICAL DATA:  80 year old female with headaches, VP shunt. EXAM: ABDOMEN - 1 VIEW COMPARISON:  Portable chest radiograph today. Abdominal radiographs 05/27/2007. FINDINGS: AP supine view at 0707 hours. Calcification of the shunt tubing from the lower chest into the upper abdomen since 2008. The tubing continues into the right hemipelvis as before. The tip is not definitely identified on this image in the pelvis, but no discontinuity is identified. Stable cholecystectomy clips. Bowel-gas pattern is within normal limits. Negative lung bases. No acute osseous abnormality identified. Aortoiliac calcified atherosclerosis. IMPRESSION: 1. Shunt tubing has partially calcified since 2008, but has a stable configuration terminating in the pelvis. No discontinuity is identified. 2.  Aortic Atherosclerosis (ICD10-I70.0). Electronically Signed   By: Mary Higgins M.D.   On: 07/19/2021 07:19   CT HEAD WO CONTRAST (5MM)  Result Date: 08/09/2021 CLINICAL DATA:  Follow-up dysarthria, subarachnoid hemorrhage EXAM: CT HEAD WITHOUT CONTRAST TECHNIQUE: Contiguous axial images were obtained from the base of the skull through the vertex without intravenous contrast. COMPARISON:  CT head 08/05/2021 FINDINGS: Brain: A left frontal ventricular catheter is in stable position terminating in the body of the left lateral ventricle near the foramen of [place]. Again seen are bilateral mixed density subdural hematomas. The right hematoma measures up to 1.4 cm in the coronal plane. The hematoma measured up to 1.4 cm on the prior study, though overall the hematoma appears slightly increased in size compared to the prior study. There are areas of hyperdensity within this collection suggesting more acute blood products. The left cerebral convexity subdural hematoma measures up to 0.8 cm in the coronal plane, previously measured up to 0.3 cm. The left collection is primarily mildly hypodense. Pneumocephalus has  decreased since the prior study. The above-described hematomas exert significant mass effect on the underlying brain parenchyma with partial effacement of the sulci, partial effacement of the lateral ventricles (right more than left), and 4 mm leftward midline shift measured at the level of the foramina [place]. The degree of midline shift appears improved compared to the prior study, previously measured 6 mm at a similar level. The basal cisterns are patent. There is suspected trace subdural blood layering along the falx posteriorly and left tentorial leaflet No subarachnoid or intraventricular blood is seen. The calcified left parafalcine meningioma is unchanged. Vascular: There is calcification of the bilateral cavernous ICAs. Skull: A right parietal burr hole is again seen. Sinuses/Orbits: The imaged paranasal sinuses are clear. Bilateral lens implants are in place. The globes and orbits are otherwise unremarkable. Other: None. IMPRESSION: 1. Interval increase in size of bilateral mixed density subdural hematomas measuring up to 1.4 cm on the right and 0.8 cm on the left. The hematomas exert mass effect on the underlying brain parenchyma and result in 4 mm leftward midline shift (previously measured 6 mm at a  similar level). 2. Decreased postoperative pneumocephalus. Electronically Signed   By: Valetta Mole M.D.   On: 08/09/2021 13:36   CT HEAD WO CONTRAST  Result Date: 08/05/2021 CLINICAL DATA:  Subdural hemorrhage s/p evacuation of SDH EXAM: CT HEAD WITHOUT CONTRAST TECHNIQUE: Contiguous axial images were obtained from the base of the skull through the vertex without intravenous contrast. COMPARISON:  08/04/2021. FINDINGS: Brain: Interval evacuation the right subdural hemorrhage via right frontal burr hole. Decreased size of the mixed density subdural hemorrhage, now measuring up to 1.4 cm in thickness (previously 2.5 cm). New moderate bifrontal pneumocephalus. Improved 6 mm of leftward midline shift  (previously 11 mm). Decreased effacement of the lateral ventricles without definite hydrocephalus. Similar positioning of a left frontal approach ventriculostomy catheter. No evidence of acute large vascular territory infarct. Basal cisterns are patent. Similar putative 12 mm calcified meningioma along the posterior left falx. Vascular: Aneurysm coil in the region of the anterior communicating artery, similar. Calcific atherosclerosis. Skull: Right frontal burr hole with overlying postsurgical swelling/gas the scalp. Sinuses/Orbits: Clear sinuses.  No acute orbital findings. Other: No mastoid effusions. IMPRESSION: 1. Interval evacuation the right subdural hemorrhage, now measuring 0.4 cm in thickness (previously 2.5 cm). 2. Improved 6 mm of leftward midline shift (previously 11 mm). Electronically Signed   By: Margaretha Sheffield M.D.   On: 08/05/2021 07:41   CT HEAD WO CONTRAST  Result Date: 08/04/2021 CLINICAL DATA:  Follow-up subdural hematoma. EXAM: CT HEAD WITHOUT CONTRAST TECHNIQUE: Contiguous axial images were obtained from the base of the skull through the vertex without intravenous contrast. COMPARISON:  08/03/2021 FINDINGS: Brain: Again seen is a large right subdural hematoma with acute on chronic components. This measures approximately 2.5 cm in thickness, image 37/4. Not significantly changed from previous exam. Mass effect upon the underlying subjacent right cerebral hemisphere is identified. Right to left midline shift measures 1.1 cm. Not significantly changed from previous exam. Mass effect upon the right lateral ventricle, which is decreased in volume appears similar to the previous study. There is a ventriculostomy catheter with tip in the anterior horn of the left lateral ventricle. Unchanged appearance of calcified posterior parafalcine meningioma. Vascular: Aneurysm coil is identified in the region of the anterior communicating artery. Skull base atherosclerosis. Skull: The left sided  ventriculostomy catheter enters from a left frontal burr hole defect. No acute fracture or focal lesion. Sinuses/Orbits: No acute finding. Other: None. IMPRESSION: 1. Similar appearance of large acute on chronic right subdural hematoma. Persistent mass effect upon the subjacent right cerebral hemisphere with right to left midline shift of 1.1 cm. 2. Stable calcified posterior parafalcine meningioma. 3. Left sided ventriculostomy catheter with tip in the anterior horn of the left lateral ventricle. 4. Aortic Atherosclerosis (ICD10-I70.0). Electronically Signed   By: Kerby Moors M.D.   On: 08/04/2021 07:17   CT Head Wo Contrast  Result Date: 08/03/2021 CLINICAL DATA:  Headache, intracranial hemorrhage suspected Unwitnessed fall. EXAM: CT HEAD WITHOUT CONTRAST TECHNIQUE: Contiguous axial images were obtained from the base of the skull through the vertex without intravenous contrast. COMPARISON:  Most recent head CT 07/19/2021 FINDINGS: Brain: Large right holo hemispheric subdural collection. This has both acute hemorrhage components as well as low-density components and measures up to 2.3 cm, measured on series 5, image 35. There is mass effect on the subjacent brain parenchyma with 9 mm right to left midline shift. Decreased ventricular size from prior exam, particularly the right lateral ventricle. No subarachnoid, intraventricular, or parenchymal hemorrhagic component. Stable left  parafalcine 13 mm calcified meningioma posteriorly. Stable positioning of ventriculoperitoneal shunt catheter from a left frontal approach. Vascular: Coiling in the region of the anterior communicating artery. Skull base atherosclerosis. No hyperdense vessel. Skull: No skull fracture. Sinuses/Orbits: No acute findings. Mild paranasal sinus mucosal thickening. No mastoid effusion. Bilateral cataract resection. Other: None. IMPRESSION: 1. Large right holo hemispheric mixed density subdural collection measures up to 2.3 cm, with acute  hemorrhage as well as low-density components. There is mass effect on the subjacent brain parenchyma with 9 mm right to left midline shift. Findings are new from CT earlier this month. 2. Mass effect on the lateral ventricles with decreased ventricular size from prior exam, particularly the right lateral ventricle. 3. Stable positioning of ventriculoperitoneal shunt catheter from a left frontal approach. 4. Stable left parafalcine calcified meningioma. Critical Value/emergent results were called by telephone at the time of interpretation on 08/03/2021 at 8:31 pm to provider HALEY SAGE , who verbally acknowledged these results. Electronically Signed   By: Keith Rake M.D.   On: 08/03/2021 20:35   CT HEAD WO CONTRAST (5MM)  Result Date: 07/19/2021 CLINICAL DATA:  Headache. EXAM: CT HEAD WITHOUT CONTRAST TECHNIQUE: Contiguous axial images were obtained from the base of the skull through the vertex without intravenous contrast. COMPARISON:  CT head dated May 20, 2020. FINDINGS: Brain: No evidence of acute infarction, hemorrhage, hydrocephalus, or extra-axial collection. Unchanged left frontal approach ventriculostomy catheter. Ventricle size is stable. Stable atrophy and bifrontal periventricular white matter gliosis. Old left inferior frontal lobe infarct again noted. Unchanged 12 mm calcified meningioma at the left parafalcine vertex. Vascular: Prior anterior communicating artery aneurysm coiling. No hyperdense vessel or unexpected calcification. Skull: Normal. Negative for fracture or focal lesion. Sinuses/Orbits: No acute finding. Other: None. IMPRESSION: 1. No acute intracranial abnormality. 2. Unchanged left frontal approach ventriculostomy catheter with stable ventricular size. 3. Unchanged 12 mm calcified meningioma at the left parafalcine vertex. Electronically Signed   By: Titus Dubin M.D.   On: 07/19/2021 06:34   CT Cervical Spine Wo Contrast  Result Date: 08/03/2021 CLINICAL DATA:  Recent fall  with neck pain EXAM: CT CERVICAL SPINE WITHOUT CONTRAST TECHNIQUE: Multidetector CT imaging of the cervical spine was performed without intravenous contrast. Multiplanar CT image reconstructions were also generated. COMPARISON:  None. FINDINGS: Alignment: Within normal limits. Skull base and vertebrae: Cervical segments are well visualized. Vertebral body height is well maintained. No acute fracture or acute facet abnormality is noted. Multilevel facet hypertrophic changes are seen particularly on the right. The odontoid is within normal limits. Soft tissues and spinal canal: Surrounding soft tissue structures demonstrate significant vascular calcifications within the carotid arteries. Shunt catheter is noted in the left neck. Upper chest: Visualized lung apices show evidence of a tiny nodule within the right upper lobe measuring 3 mm. No other focal abnormality is noted. Other: None IMPRESSION: Multilevel degenerative change without acute bony abnormality. 3 mm right solid pulmonary nodule within the upper lobe. If patient is low risk for malignancy, no routine follow-up imaging is recommended; if patient is high risk for malignancy, a non-contrast Chest CT at 12 months is optional. If performed and the nodule is stable at 12 months, no further follow-up is recommended. These guidelines do not apply to immunocompromised patients and patients with cancer. Follow up in patients with significant comorbidities as clinically warranted. For lung cancer screening, adhere to Lung-RADS guidelines. Reference: Radiology. 2017; 284(1):228-43. Electronically Signed   By: Inez Catalina M.D.   On: 08/03/2021 20:40  Portable Chest x-ray  Result Date: 08/04/2021 CLINICAL DATA:  Check endotracheal tube placement EXAM: PORTABLE CHEST 1 VIEW COMPARISON:  Film from earlier in the same day. FINDINGS: Cardiac shadow is stable. Aortic calcifications are again seen. Pacing device is stable. Endotracheal tube and gastric catheter have  been placed in satisfactory position. The lungs are again clear bilaterally. Left-sided ventriculoperitoneal shunt is noted. No acute bony abnormality is seen. IMPRESSION: No acute abnormality noted. Tubes and lines as described. Electronically Signed   By: Inez Catalina M.D.   On: 08/04/2021 16:48    Microbiology: Recent Results (from the past 240 hour(s))  Resp Panel by RT-PCR (Flu A&B, Covid) Nasopharyngeal Swab     Status: None   Collection Time: 08/03/21  9:32 PM   Specimen: Nasopharyngeal Swab; Nasopharyngeal(NP) swabs in vial transport medium  Result Value Ref Range Status   SARS Coronavirus 2 by RT PCR NEGATIVE NEGATIVE Final    Comment: (NOTE) SARS-CoV-2 target nucleic acids are NOT DETECTED.  The SARS-CoV-2 RNA is generally detectable in upper respiratory specimens during the acute phase of infection. The lowest concentration of SARS-CoV-2 viral copies this assay can detect is 138 copies/mL. A negative result does not preclude SARS-Cov-2 infection and should not be used as the sole basis for treatment or other patient management decisions. A negative result may occur with  improper specimen collection/handling, submission of specimen other than nasopharyngeal swab, presence of viral mutation(s) within the areas targeted by this assay, and inadequate number of viral copies(<138 copies/mL). A negative result must be combined with clinical observations, patient history, and epidemiological information. The expected result is Negative.  Fact Sheet for Patients:  EntrepreneurPulse.com.au  Fact Sheet for Healthcare Providers:  IncredibleEmployment.be  This test is no t yet approved or cleared by the [place] FDA and  has been authorized for detection and/or diagnosis of SARS-CoV-2 by FDA under an Emergency Use Authorization (EUA). This EUA will remain  in effect (meaning this test can be used) for the duration of the COVID-19 declaration  under Section 564(b)(1) of the Act, 21 U.S.C.section 360bbb-3(b)(1), unless the authorization is terminated  or revoked sooner.       Influenza A by PCR NEGATIVE NEGATIVE Final   Influenza B by PCR NEGATIVE NEGATIVE Final    Comment: (NOTE) The Xpert Xpress SARS-CoV-2/FLU/RSV plus assay is intended as an aid in the diagnosis of influenza from Nasopharyngeal swab specimens and should not be used as a sole basis for treatment. Nasal washings and aspirates are unacceptable for Xpert Xpress SARS-CoV-2/FLU/RSV testing.  Fact Sheet for Patients: EntrepreneurPulse.com.au  Fact Sheet for Healthcare Providers: IncredibleEmployment.be  This test is not yet approved or cleared by the [place] FDA and has been authorized for detection and/or diagnosis of SARS-CoV-2 by FDA under an Emergency Use Authorization (EUA). This EUA will remain in effect (meaning this test can be used) for the duration of the COVID-19 declaration under Section 564(b)(1) of the Act, 21 U.S.C. section 360bbb-3(b)(1), unless the authorization is terminated or revoked.  Performed at [facility], [place] [address], [place], [place] [address]   MRSA Next Gen by PCR, Nasal     Status: None   Collection Time: 08/04/21  5:45 PM   Specimen: Nasal Mucosa; Nasal Swab  Result Value Ref Range Status   MRSA by PCR Next Gen NOT DETECTED NOT DETECTED Final    Comment: (NOTE) The GeneXpert MRSA Assay (FDA approved for NASAL specimens only), is one component of a comprehensive MRSA  colonization surveillance program. It is not intended to diagnose MRSA infection nor to guide or monitor treatment for MRSA infections. Test performance is not FDA approved in patients less than 25 years old. Performed at [facility], [place] [address] [place], [place] [address]      Labs: Basic Metabolic Panel: Recent Labs  Lab 08/03/21 1821 08/04/21 0521 08/04/21 1528 08/05/21 0529  08/08/21 0557  NA 138 138 139 136 137  K 3.9 3.4* 3.7 3.8 3.1*  CL 104 103  --  105 104  CO2 25 25  --  21* 26  GLUCOSE 126* 129*  --  166* 109*  BUN 12 8  --  13 7*  CREATININE 0.76 0.64  --  0.65 0.56  CALCIUM 9.1 8.8*  --  8.7* 8.5*   Liver Function Tests: Recent Labs  Lab 08/03/21 1821  AST 14*  ALT 9  ALKPHOS 61  BILITOT 0.7  PROT 6.0*  ALBUMIN 3.4*   No results for input(s): LIPASE, AMYLASE in the last 168 hours. No results for input(s): AMMONIA in the last 168 hours. CBC: Recent Labs  Lab 08/03/21 1821 08/04/21 0521 08/04/21 1528 08/05/21 0529 08/08/21 0557  WBC 12.3* 8.8  --  8.0 6.0  NEUTROABS 10.3*  --   --   --  3.1  HGB 11.7* 11.8* 11.2* 11.1* 9.6*  HCT 35.3* 36.1 33.0* 33.4* 29.1*  MCV 91.7 91.2  --  89.3 90.4  PLT 293 290  --  253 286   Cardiac Enzymes: No results for input(s): CKTOTAL, CKMB, CKMBINDEX, TROPONINI in the last 168 hours. BNP: BNP (last 3 results) Recent Labs    07/19/21 0625  BNP 115.4*    ProBNP (last 3 results) No results for input(s): PROBNP in the last 8760 hours.  CBG: Recent Labs  Lab 08/09/21 1123 08/09/21 1558 08/09/21 2150 08/10/21 0753 08/10/21 1143  GLUCAP 81 94 96 103* 99       Signed:  Nita Sells MD   [facility] 08/10/2021, 2:30 PM

## 2021-08-10 NOTE — Progress Notes (Signed)
Patient and daughter educated on AVS including follow up instructions and when to follow up with neuro and PCP.  Educated on medication administration and where to pick up Rx.  Patient PIV was removed and assisted to get dressed.  Was taken down in wheelchair to daughters personal car.  All belongings sent with patient at this time.

## 2021-08-10 NOTE — TOC Initial Note (Signed)
Transition of Care [facility]) - Initial/Assessment Note    Patient Details  Name: Mary Higgins MRN: [id number] Date of Birth: 06/18/1941  Transition of Care [facility]) CM/SW Contact:    Ella Bodo, RN Phone Number: 08/10/2021, 2:08 PM  Clinical Narrative:                 80 yo female presenting to ED on 9/22 after a mechnical fall. CT head showing large acute right subdural hematoma. S/p R burr hole evacuation on 9/22.  PT/OT now recommending [place] follow up; met with patient to discuss home arrangements, and she is agreeable to [facility] follow up.  She asks that I call her daughter, Shanna Cisco. Spoke with daughter; she is also agreeable to PT/OT follow up at home.  Referral to [facility] for continued therapies.  Daughter states pt has all needed equipment, and she and her brother can provide needed 24h assistance.  Possible dc later today pending CT results.      Expected Discharge Plan: [place] Barriers to Discharge: Barriers Resolved   Patient Goals and CMS Choice Patient states their goals for this hospitalization and ongoing recovery are:: to go home CMS Medicare.gov Compare Post Acute Care list provided to:: Patient Represenative (must comment) (daughter) Choice offered to / list presented to : Adult Children  Expected Discharge Plan and Services Expected Discharge Plan: [place]   Discharge Planning Services: CM Consult Post Acute Care Choice: [place] arrangements for the past 2 months: Single Family Home                           HH Arranged: PT, OT HH Agency: [place] Date [facility] Agency Contacted: 08/10/21 Time [place]: 1212 Representative spoke with at [place]: Adela Lank  Prior Living Arrangements/Services Living arrangements for the past 2 months: [place] Lives with:: Adult Children Patient language and need for interpreter reviewed:: Yes Do you feel safe going back to  the place where you live?: Yes      Need for Family Participation in Patient Care: Yes (Comment) Care giver support system in place?: Yes (comment) Current home services: DME Criminal Activity/Legal Involvement Pertinent to Current Situation/Hospitalization: No - Comment as needed  Activities of Daily Living      Permission Sought/Granted   Permission granted to share information with : Yes, Verbal Permission Granted  Share Information with NAME: Shanna Cisco  Permission granted to share info w AGENCY: [place] granted to share info w Relationship: daughter  Permission granted to share info w Contact Information: [phone]  Emotional Assessment Appearance:: Appears stated age Attitude/Demeanor/Rapport: Engaged Affect (typically observed): Accepting Orientation: : Oriented to Self, Oriented to Place, Oriented to  Time, Oriented to Situation      Admission diagnosis:  Subdural hemorrhage ([place]) [I62.00] Cough [R05.9] Subdural hematoma [S06.5X9A] Patient Active Problem List   Diagnosis Date Noted   Subdural hemorrhage ([place]) 08/04/2021   Meningioma ([place])    Subdural hematoma ([place]) 08/03/2021   Midline shift of brain 08/03/2021   Pacemaker 08/03/2021   Solitary pulmonary nodule 08/03/2021   Headache 07/27/2021   Hyponatremia 02/07/2017   Diarrhea 12/13/2016   Actinic keratosis 12/13/2016   Fall at home, initial encounter 12/13/2016   Cervicalgia 08/18/2015   Onychomycosis 01/20/2015   Hyperlipidemia 01/20/2015   Syncope and collapse 09/10/2014   Insomnia 03/18/2014   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 03/18/2014   Flank pain 03/18/2014   Blepharospasm 03/18/2014   GERD (gastroesophageal reflux disease) 03/18/2014   Primary hypertension 03/18/2014   Type 2 diabetes mellitus with hyperglycemia, without long-term current use of insulin ([place]) 03/18/2014   Breast mass 03/18/2014   Abnormality of gait 03/18/2014   History of fall 03/18/2014   Other malaise and  fatigue 03/18/2014   Major depressive disorder with single episode 03/18/2014   Sinus node dysfunction ([place]) 12/21/2013   Tobacco abuse 12/21/2013   Edema 12/21/2013   Neurocardiogenic syncope    Subarachnoid hemorrhage ([place]) 06/01/2002   History of cerebral aneurysm 06/01/2002   S/P VP shunt 2003   PCP:  [name], NP Pharmacy:   [facility] [place], [place] - [address] [place] [address] Phone: [phone] Fax: [phone]     Social Determinants of Health (SDOH) Interventions    Readmission Risk Interventions No flowsheet data found.  Reinaldo Raddle, RN, BSN  Trauma/Neuro ICU Case Manager [phone]

## 2021-08-10 NOTE — Plan of Care (Signed)
  Problem: Safety: Goal: Non-violent Restraint(s) Outcome: Adequate for Discharge   Problem: Education: Goal: Knowledge of General Education information will improve Description: Including pain rating scale, medication(s)/side effects and non-pharmacologic comfort measures Outcome: Adequate for Discharge   Problem: Health Behavior/Discharge Planning: Goal: Ability to manage health-related needs will improve Outcome: Adequate for Discharge   Problem: Clinical Measurements: Goal: Ability to maintain clinical measurements within normal limits will improve Outcome: Adequate for Discharge Goal: Will remain free from infection Outcome: Adequate for Discharge Goal: Diagnostic test results will improve Outcome: Adequate for Discharge Goal: Respiratory complications will improve Outcome: Adequate for Discharge Goal: Cardiovascular complication will be avoided Outcome: Adequate for Discharge   Problem: Activity: Goal: Risk for activity intolerance will decrease Outcome: Adequate for Discharge   Problem: Nutrition: Goal: Adequate nutrition will be maintained Outcome: Adequate for Discharge   Problem: Coping: Goal: Level of anxiety will decrease Outcome: Adequate for Discharge   Problem: Elimination: Goal: Will not experience complications related to bowel motility Outcome: Adequate for Discharge Goal: Will not experience complications related to urinary retention Outcome: Adequate for Discharge   Problem: Pain Managment: Goal: General experience of comfort will improve Outcome: Adequate for Discharge   Problem: Safety: Goal: Ability to remain free from injury will improve Outcome: Adequate for Discharge   Problem: Skin Integrity: Goal: Risk for impaired skin integrity will decrease Outcome: Adequate for Discharge   Problem: Acute Rehab PT Goals(only PT should resolve) Goal: Pt Will Go Supine/Side To Sit Outcome: Adequate for Discharge Goal: Patient Will Transfer Sit  To/From Stand Outcome: Adequate for Discharge Goal: Pt Will Ambulate Outcome: Adequate for Discharge Goal: Pt Will Go Up/Down Stairs Outcome: Adequate for Discharge Goal: Pt/caregiver will Perform Home Exercise Program Outcome: Adequate for Discharge   Problem: Acute Rehab OT Goals (only OT should resolve) Goal: Pt. Will Perform Grooming Outcome: Adequate for Discharge Goal: Pt. Will Perform Lower Body Dressing Outcome: Adequate for Discharge Goal: Pt. Will Transfer To Toilet Outcome: Adequate for Discharge Goal: Pt. Will Perform Toileting-Clothing Manipulation Outcome: Adequate for Discharge Goal: OT Additional ADL Goal #1 Outcome: Adequate for Discharge

## 2021-08-11 ENCOUNTER — Telehealth: Payer: Self-pay | Admitting: *Deleted

## 2021-08-11 DIAGNOSIS — F32A Depression, unspecified: Secondary | ICD-10-CM | POA: Diagnosis not present

## 2021-08-11 DIAGNOSIS — R519 Headache, unspecified: Secondary | ICD-10-CM | POA: Diagnosis not present

## 2021-08-11 DIAGNOSIS — G245 Blepharospasm: Secondary | ICD-10-CM | POA: Diagnosis not present

## 2021-08-11 DIAGNOSIS — H18519 Endothelial corneal dystrophy, unspecified eye: Secondary | ICD-10-CM | POA: Diagnosis not present

## 2021-08-11 DIAGNOSIS — H538 Other visual disturbances: Secondary | ICD-10-CM | POA: Diagnosis not present

## 2021-08-11 DIAGNOSIS — E119 Type 2 diabetes mellitus without complications: Secondary | ICD-10-CM | POA: Diagnosis not present

## 2021-08-11 DIAGNOSIS — K529 Noninfective gastroenteritis and colitis, unspecified: Secondary | ICD-10-CM | POA: Diagnosis not present

## 2021-08-11 DIAGNOSIS — J449 Chronic obstructive pulmonary disease, unspecified: Secondary | ICD-10-CM | POA: Diagnosis not present

## 2021-08-11 DIAGNOSIS — R0989 Other specified symptoms and signs involving the circulatory and respiratory systems: Secondary | ICD-10-CM | POA: Diagnosis not present

## 2021-08-11 DIAGNOSIS — I10 Essential (primary) hypertension: Secondary | ICD-10-CM | POA: Diagnosis not present

## 2021-08-11 DIAGNOSIS — R911 Solitary pulmonary nodule: Secondary | ICD-10-CM | POA: Diagnosis not present

## 2021-08-11 DIAGNOSIS — F419 Anxiety disorder, unspecified: Secondary | ICD-10-CM | POA: Diagnosis not present

## 2021-08-11 DIAGNOSIS — D320 Benign neoplasm of cerebral meninges: Secondary | ICD-10-CM | POA: Diagnosis not present

## 2021-08-11 DIAGNOSIS — I671 Cerebral aneurysm, nonruptured: Secondary | ICD-10-CM | POA: Diagnosis not present

## 2021-08-11 DIAGNOSIS — S065X0D Traumatic subdural hemorrhage without loss of consciousness, subsequent encounter: Secondary | ICD-10-CM | POA: Diagnosis not present

## 2021-08-11 DIAGNOSIS — R55 Syncope and collapse: Secondary | ICD-10-CM | POA: Diagnosis not present

## 2021-08-11 NOTE — Telephone Encounter (Signed)
Transition Care Management Unsuccessful Follow-up Telephone Call  Date of discharge and from where:  08/10/2021 [place]  Attempts:  1st Attempt  Reason for unsuccessful TCM follow-up call:  Unable to leave message Voicemail not set up, cannot leave message.

## 2021-08-11 NOTE — Telephone Encounter (Signed)
Transition Care Management Follow-up Telephone Call Date of discharge and from where: 08/10/2021 [place] How have you been since you were released from the hospital? Still weak Any questions or concerns? Yes Patient thinks she has a UTI. Having burning.   Items Reviewed: Did the pt receive and understand the discharge instructions provided? Yes  Medications obtained and verified? Yes  Other? No  Any new allergies since your discharge? Yes  Dietary orders reviewed? Yes Do you have support at home? Yes   Home Care and Equipment/Supplies: Were home health services ordered? yes If so, what is the name of the agency? Not sure of name  Has the agency set up a time to come to the patient's home? yes Were any new equipment or medical supplies ordered?  No What is the name of the medical supply agency? na Were you able to get the supplies/equipment? not applicable Do you have any questions related to the use of the equipment or supplies? No  Functional Questionnaire: (I = Independent and D = Dependent) ADLs: I with assistance  Bathing/Dressing- I with assistance  Meal Prep- D  Eating- I  Maintaining continence- I  Transferring/Ambulation- I with assistance   Managing Meds- I  Follow up appointments reviewed:  PCP Hospital f/u appt confirmed?  Daughter stated that she will talk with her mother and call back to schedule an appointment. I offered appointment for tomorrow for [facility] and [facility] but she refused and stated they will call back.  [facility] f/u appt confirmed? No  Are transportation arrangements needed? No  If their condition worsens, is the pt aware to call PCP or go to the Emergency Dept.? Yes Was the patient provided with contact information for the PCP's office or ED? Yes Was to pt encouraged to call back with questions or concerns? Yes

## 2021-08-18 ENCOUNTER — Other Ambulatory Visit (HOSPITAL_BASED_OUTPATIENT_CLINIC_OR_DEPARTMENT_OTHER): Payer: Self-pay | Admitting: Neurological Surgery

## 2021-08-18 DIAGNOSIS — S065XAA Traumatic subdural hemorrhage with loss of consciousness status unknown, initial encounter: Secondary | ICD-10-CM

## 2021-08-19 ENCOUNTER — Other Ambulatory Visit: Payer: Self-pay | Admitting: Nurse Practitioner

## 2021-08-19 ENCOUNTER — Telehealth: Payer: Self-pay | Admitting: *Deleted

## 2021-08-19 NOTE — Telephone Encounter (Signed)
Mary Higgins with [facility] called and stated that he saw patient last Wednesday and she was complaining about having a Headache due to her Hematoma/Concussion.  He stated that he was going to see her today and patient refused visit stating that she still has the headache.   FYI

## 2021-08-19 NOTE — Telephone Encounter (Signed)
Noted, she needs to make sure she has appt to follow up with neurosurgery as well.

## 2021-08-19 NOTE — Telephone Encounter (Signed)
LMOM for Mary Higgins to return call.

## 2021-08-22 NOTE — Telephone Encounter (Signed)
Sean notified and agreed.

## 2021-08-23 ENCOUNTER — Encounter: Payer: Medicare Other | Admitting: Family Medicine

## 2021-08-23 DIAGNOSIS — R55 Syncope and collapse: Secondary | ICD-10-CM

## 2021-08-23 DIAGNOSIS — I10 Essential (primary) hypertension: Secondary | ICD-10-CM | POA: Diagnosis not present

## 2021-08-23 DIAGNOSIS — D320 Benign neoplasm of cerebral meninges: Secondary | ICD-10-CM

## 2021-08-23 DIAGNOSIS — R519 Headache, unspecified: Secondary | ICD-10-CM

## 2021-08-23 DIAGNOSIS — H538 Other visual disturbances: Secondary | ICD-10-CM

## 2021-08-23 DIAGNOSIS — K529 Noninfective gastroenteritis and colitis, unspecified: Secondary | ICD-10-CM

## 2021-08-23 DIAGNOSIS — I671 Cerebral aneurysm, nonruptured: Secondary | ICD-10-CM | POA: Diagnosis not present

## 2021-08-23 DIAGNOSIS — H18519 Endothelial corneal dystrophy, unspecified eye: Secondary | ICD-10-CM

## 2021-08-23 DIAGNOSIS — S065X0D Traumatic subdural hemorrhage without loss of consciousness, subsequent encounter: Secondary | ICD-10-CM | POA: Diagnosis not present

## 2021-08-23 DIAGNOSIS — R911 Solitary pulmonary nodule: Secondary | ICD-10-CM

## 2021-08-23 DIAGNOSIS — J449 Chronic obstructive pulmonary disease, unspecified: Secondary | ICD-10-CM

## 2021-08-23 DIAGNOSIS — E119 Type 2 diabetes mellitus without complications: Secondary | ICD-10-CM | POA: Diagnosis not present

## 2021-08-23 NOTE — Progress Notes (Deleted)
Provider:  Alain Honey, MD  Careteam: Patient Care Team: [name], NP as PCP - General (Geriatric Medicine) Patsey Berthold, NP as Nurse Practitioner (Cardiology) Darleen Crocker, MD as Consulting Physician (Ophthalmology) Thompson Grayer, MD as Consulting Physician (Cardiology) Otelia Sergeant, OD as Referring Physician (Ophthalmology)  PLACE OF SERVICE:  [place]  Advanced Directive information    Allergies  Allergen Reactions  . Oxaprozin Rash    Chief Complaint  Patient presents with  . Error     HPI: Patient is a 80 y.o. female   Review of Systems:  ROS  Past Medical History:  Diagnosis Date  . Anxiety and depression    Hx of  . Blepharospasm    . Breast cancer ([place]) 1987   s/p lumpectomy.  . Carotid bruit    Bilateral. Mild to moderate disease on LEFT and mild on the RIGHT in 2012  . COPD (chronic obstructive pulmonary disease) (HCC)    mild. Not on inhalers.  . Diabetes mellitus without complication ([place])    AODM  . Fuchs' corneal dystrophy   . GERD (gastroesophageal reflux disease)    . Hyperlipidemia   . Hypertension    . Insomnia    . Intracerebral bleed ([place]) 2003   Remote intracerebral blled and coiling by Dr. Estanislado Pandy for cerebral aneurysm and a shunt placed by Dr. Vertell Limber remotely. No seizure disorder and this happened in 2003 with an anterior communicating aneurysm.  . Meningioma ([place])    left parafalcine calcified meningioma  . Neurocardiogenic syncope    a. s/p Biotronik dual chamber pacemaker  . Pacemaker   . Personal history of radiation therapy   . S/P VP shunt 2003  . Sinus node dysfunction (HCC)    . Tobacco abuse    . Type II or unspecified type diabetes mellitus without mention of complication, not stated as uncontrolled     Past Surgical History:  Procedure Laterality Date  . ANEURYSM COILING  2003   Cerebral aneurysm ;Dr. Luanne Bras  . BREAST LUMPECTOMY Right    For cancer, no recurrence  .  CHOLECYSTECTOMY    . CRANIOTOMY Right 08/04/2021   Procedure: BURR HOLES FOR SUBDURAL HEMATOMA;  Surgeon: Judith Part, MD;  Location: [place];  Service: Neurosurgery;  Laterality: Right;  . PACEMAKER GENERATOR CHANGE N/A 09/25/2014   BTK dual chamber pacemkaer implanted by Dr Rayann Heman  . PACEMAKER INSERTION  07/12/2006   St[employer] Jude Victory XL DR  -  Dr. Tami Ribas  . PARTIAL HYSTERECTOMY    . VENTRICULOPERITONEAL SHUNT  2003   Dr. Erline Levine   Social History:   reports that she has been smoking cigarettes. She started smoking about 2 years ago. She has never used smokeless tobacco. She reports that she does not drink alcohol and does not use drugs.  Family History  Problem Relation Age of Onset  . Cancer Mother   . Heart attack Father   . Heart attack Son        Heart Pump placed   . Diabetes Son   . Hypertension Other     Medications: Patient's Medications  New Prescriptions   No medications on file  Previous Medications   ACETAMINOPHEN (TYLENOL) 500 MG TABLET    Take 1,000 mg by mouth in the morning and at bedtime.   ARTIFICIAL TEAR OINTMENT (DRY EYES OP)    Place 1 drop into both eyes daily as needed (dry eyes).   BLOOD GLUCOSE MONITORING SUPPL (ONE  TOUCH ULTRA 2) W/DEVICE KIT    USE UP TO FOUR TIMES DAILY AS DIRECTED   BUPROPION (WELLBUTRIN SR) 150 MG 12 HR TABLET    Take 1 tablet (150 mg total) by mouth daily.   CLONAZEPAM (KLONOPIN) 1 MG TABLET    Take 1 tablet (1 mg total) by mouth 3 (three) times daily as needed.   LOSARTAN (COZAAR) 100 MG TABLET    Take 1/2 tablet by mouth in the morning and Take 1/2 tablet by mouth in the evening.   METFORMIN (GLUCOPHAGE) 500 MG TABLET    TAKE 1 TABLET BY MOUTH WITH BREAKFAST AND WITH SUPPER FOR DIABETES   PANTOPRAZOLE (PROTONIX) 40 MG TABLET    TAKE 1 TABLET(40 MG) BY MOUTH DAILY   POLYETHYLENE GLYCOL (MIRALAX / GLYCOLAX) 17 G PACKET    Take 17 g by mouth daily as needed for mild constipation.  Modified Medications   No medications on  file  Discontinued Medications   No medications on file    Physical Exam:  There were no vitals filed for this visit. There is no height or weight on file to calculate BMI. Wt Readings from Last 3 Encounters:  08/04/21 180 lb 5.4 oz (81.8 kg)  07/27/21 180 lb 6.4 oz (81.8 kg)  07/19/21 155 lb (70.3 kg)    Physical Exam  Labs reviewed: Basic Metabolic Panel: Recent Labs    08/04/21 0521 08/04/21 1528 08/05/21 0529 08/08/21 0557  NA 138 139 136 137  K 3.4* 3.7 3.8 3.1*  CL 103  --  105 104  CO2 25  --  21* 26  GLUCOSE 129*  --  166* 109*  BUN 8  --  13 7*  CREATININE 0.64  --  0.65 0.56  CALCIUM 8.8*  --  8.7* 8.5*    Liver Function Tests: Recent Labs    12/08/20 1225 07/19/21 0625 08/03/21 1821  AST 8* 13* 14*  ALT $Re'6 9 9  'QmO$ ALKPHOS  --  54 61  BILITOT 0.6 1.0 0.7  PROT 6.2 6.8 6.0*  ALBUMIN  --  4.0 3.4*    No results for input(s): LIPASE, AMYLASE in the last 8760 hours. No results for input(s): AMMONIA in the last 8760 hours. CBC: Recent Labs    12/08/20 1225 07/19/21 0330 08/03/21 1821 08/04/21 0521 08/04/21 1528 08/05/21 0529 08/08/21 0557  WBC 5.9   < > 12.3* 8.8  --  8.0 6.0  NEUTROABS 3,452  --  10.3*  --   --   --  3.1  HGB 12.0   < > 11.7* 11.8* 11.2* 11.1* 9.6*  HCT 36.6   < > 35.3* 36.1 33.0* 33.4* 29.1*  MCV 86.1   < > 91.7 91.2  --  89.3 90.4  PLT 290   < > 293 290  --  253 286   < > = values in this interval not displayed.    Lipid Panel: Recent Labs    08/03/21 2022 08/05/21 0529  CHOL 125  --   HDL 34*  --   LDLCALC 71  --   TRIG 98 114  CHOLHDL 3.7  --     TSH: No results for input(s): TSH in the last 8760 hours. A1C: Lab Results  Component Value Date   HGBA1C 6.2 (H) 08/04/2021     Assessment/Plan    Alain Honey, MD [place] [phone]   This encounter was created in error - please disregard.

## 2021-08-24 NOTE — Progress Notes (Signed)
This encounter was created in error - please disregard.

## 2021-08-26 ENCOUNTER — Ambulatory Visit (HOSPITAL_COMMUNITY)
Admission: RE | Admit: 2021-08-26 | Discharge: 2021-08-26 | Disposition: A | Discharge status: Home / Self Care | Payer: Medicare Other | Source: Ambulatory Visit | Attending: Neurological Surgery | Admitting: Neurological Surgery

## 2021-08-26 ENCOUNTER — Other Ambulatory Visit: Payer: Self-pay

## 2021-08-26 DIAGNOSIS — S065XAA Traumatic subdural hemorrhage with loss of consciousness status unknown, initial encounter: Secondary | ICD-10-CM | POA: Insufficient documentation

## 2021-08-31 LAB — CUP PACEART REMOTE DEVICE CHECK
Battery Remaining Percentage: 55 %
Brady Statistic RA Percent Paced: 33 %
Brady Statistic RV Percent Paced: 0 %
Date Time Interrogation Session: 20221019092235
Implantable Lead Implant Date: 20070830
Implantable Lead Implant Date: 20070830
Implantable Lead Location: 753859
Implantable Lead Location: 753860
Implantable Pulse Generator Implant Date: 20151113
Lead Channel Impedance Value: 273 Ohm
Lead Channel Impedance Value: 507 Ohm
Lead Channel Pacing Threshold Amplitude: 0.6 V
Lead Channel Pacing Threshold Pulse Width: 0.4 ms
Lead Channel Sensing Intrinsic Amplitude: 1.8 mV
Lead Channel Sensing Intrinsic Amplitude: 6.5 mV
Lead Channel Setting Pacing Amplitude: 1.5 V
Lead Channel Setting Pacing Amplitude: 3.6 V
Lead Channel Setting Pacing Pulse Width: 0.4 ms
Pulse Gen Model: 394931
Pulse Gen Serial Number: 68408118

## 2021-09-01 ENCOUNTER — Ambulatory Visit (INDEPENDENT_AMBULATORY_CARE_PROVIDER_SITE_OTHER): Payer: Medicare Other

## 2021-09-01 DIAGNOSIS — I495 Sick sinus syndrome: Secondary | ICD-10-CM

## 2021-09-06 DIAGNOSIS — H26493 Other secondary cataract, bilateral: Secondary | ICD-10-CM | POA: Diagnosis not present

## 2021-09-06 DIAGNOSIS — E119 Type 2 diabetes mellitus without complications: Secondary | ICD-10-CM | POA: Diagnosis not present

## 2021-09-06 DIAGNOSIS — Z7984 Long term (current) use of oral hypoglycemic drugs: Secondary | ICD-10-CM | POA: Diagnosis not present

## 2021-09-06 DIAGNOSIS — H18513 Endothelial corneal dystrophy, bilateral: Secondary | ICD-10-CM | POA: Diagnosis not present

## 2021-09-06 LAB — HM DIABETES EYE EXAM

## 2021-09-08 NOTE — Progress Notes (Signed)
Remote pacemaker transmission.   

## 2021-09-16 DIAGNOSIS — D320 Benign neoplasm of cerebral meninges: Secondary | ICD-10-CM | POA: Diagnosis not present

## 2021-09-16 DIAGNOSIS — I10 Essential (primary) hypertension: Secondary | ICD-10-CM | POA: Diagnosis not present

## 2021-09-16 DIAGNOSIS — H18519 Endothelial corneal dystrophy, unspecified eye: Secondary | ICD-10-CM | POA: Diagnosis not present

## 2021-09-16 DIAGNOSIS — R0989 Other specified symptoms and signs involving the circulatory and respiratory systems: Secondary | ICD-10-CM | POA: Diagnosis not present

## 2021-09-16 DIAGNOSIS — H538 Other visual disturbances: Secondary | ICD-10-CM | POA: Diagnosis not present

## 2021-09-16 DIAGNOSIS — I671 Cerebral aneurysm, nonruptured: Secondary | ICD-10-CM | POA: Diagnosis not present

## 2021-09-16 DIAGNOSIS — S065X0D Traumatic subdural hemorrhage without loss of consciousness, subsequent encounter: Secondary | ICD-10-CM | POA: Diagnosis not present

## 2021-09-16 DIAGNOSIS — R55 Syncope and collapse: Secondary | ICD-10-CM | POA: Diagnosis not present

## 2021-09-16 DIAGNOSIS — J449 Chronic obstructive pulmonary disease, unspecified: Secondary | ICD-10-CM | POA: Diagnosis not present

## 2021-09-16 DIAGNOSIS — R519 Headache, unspecified: Secondary | ICD-10-CM | POA: Diagnosis not present

## 2021-09-16 DIAGNOSIS — F419 Anxiety disorder, unspecified: Secondary | ICD-10-CM | POA: Diagnosis not present

## 2021-09-16 DIAGNOSIS — G245 Blepharospasm: Secondary | ICD-10-CM | POA: Diagnosis not present

## 2021-09-16 DIAGNOSIS — E119 Type 2 diabetes mellitus without complications: Secondary | ICD-10-CM | POA: Diagnosis not present

## 2021-09-16 DIAGNOSIS — F32A Depression, unspecified: Secondary | ICD-10-CM | POA: Diagnosis not present

## 2021-09-16 DIAGNOSIS — R911 Solitary pulmonary nodule: Secondary | ICD-10-CM | POA: Diagnosis not present

## 2021-09-16 DIAGNOSIS — K529 Noninfective gastroenteritis and colitis, unspecified: Secondary | ICD-10-CM | POA: Diagnosis not present

## 2021-09-26 DIAGNOSIS — I6203 Nontraumatic chronic subdural hemorrhage: Secondary | ICD-10-CM | POA: Diagnosis not present

## 2021-09-26 DIAGNOSIS — R519 Headache, unspecified: Secondary | ICD-10-CM | POA: Diagnosis not present

## 2021-09-26 DIAGNOSIS — I10 Essential (primary) hypertension: Secondary | ICD-10-CM | POA: Diagnosis not present

## 2021-09-28 ENCOUNTER — Other Ambulatory Visit (HOSPITAL_COMMUNITY): Payer: Self-pay | Admitting: Neurosurgery

## 2021-09-28 DIAGNOSIS — S065XAA Traumatic subdural hemorrhage with loss of consciousness status unknown, initial encounter: Secondary | ICD-10-CM

## 2021-09-29 ENCOUNTER — Encounter (HOSPITAL_COMMUNITY): Payer: Self-pay | Admitting: Neurosurgery

## 2021-09-29 ENCOUNTER — Other Ambulatory Visit: Payer: Self-pay | Admitting: Neurosurgery

## 2021-09-29 ENCOUNTER — Other Ambulatory Visit: Payer: Self-pay

## 2021-09-29 NOTE — Progress Notes (Signed)
Anesthesia Chart Review: SAME DAY WORK-UP  Case: [id number] Date/Time: 09/30/21 1400   Procedure: Arteriogram, Onyx embolization of right middle meningeal artery   Anesthesia type: General   Pre-op diagnosis: S06.5X9A Subdural hematoma   Location: [facility] OR ROOM 21 / [facility]   Surgeons: Consuella Lose, MD       DISCUSSION: Patient is an 80 year old female scheduled for the above procedure. She is s/p coil embolization of rupture ACA aneurysm in 2003 and had a VP shunt placed as well. More recently she had a traumatic SDH, s/p right burr hold evacuation of SDH, ligation of left VP shunt 08/04/21. Per Dr. Cleotilde Neer 09/26/21 note, "While she has tolerated the shunt ligation, she has continued to complain of headaches and follow-up CT scan has demonstrated progressive reaccumulation of the right-sided subdural hematoma. She was therefore referred to me for possible middle meningeal artery embolization."  History includes smoking, COPD, HTN, HLD, DM2, sinus node dysfunction/syncope (s/p initial PPM 07/12/06; generator replacement Biotronik PPM 09/25/14), meningioma (unchanged 12 mm calcified left parafalcine meningioma 07/19/21 CT), ICH/Grade IV SAH (s/p coiling ruptured ACA aneurysm 06/01/02, s/p VP shunt 06/24/02), traumatic SDH (s/p right burr hold evacuation of SDH, ligation of left VP shunt 08/04/21), carotid artery disease (moderate left, mild right 2014), breast cancer (s/p left lumpectomy, radiation 1987).   - [facility] admission 08/03/21-08/10/21 for mechanical fall in bathroom and apparently hit the back of her head on the but. She denied LOC, but later became more sleeping and son found her "passed out" on the toilet. She reported having an headache and blurry vision. CT showed 2.3 cm SDH with a 9 mm shift. Patient admitted, and neurosurgeon consulted. S/p right burr hole evacuation of SDH, ligation of left VP shunt 08/04/21. She was intubated in the OR, and transferred to SICU post-operatively. Extubated  08/05/21. Repeat head CT for mental status changes was stable. Discharged home with [facility] PT/OT after BP trends felt stable.    - [facility] ED visit 07/19/21 for HTN (home SBP > 200) and headache. Daughter living with patient. Patient with reportedly declining memory and did not always remember to take medications. Headaches improved with Tylenol. Head CT showed no acute findings. EKG non-ischemic. She was treated with losartan, and started back on 1/2 dose home losartan with recommendation for home BP checks with PCP follow-up. At 07/27/21 primary care follow-up BP 146/98, losartan changed to 100 mg 1/2 tablet BID.   Last EP office visit seen is from 08/21/17 with Chanetta Marshall, NP. Biotronik 8 DR-T PPM device remote transmission 08/31/21: "Normal device function. Next remote 91 days." Batty status "OK/55%".   Patient's daughter reported that her brother had a high fever ("107") after anesthesia/surgery in 10/2019. She was unsure about any diagnosis of malignant hyperthermia. Patient without known anesthesia complication. She received isoflurane 08/04/21. Patient's daughter is attempting to get more specific information from her brother regarding his anesthesia history.   She had mild-moderate carotid disease per Duplex in 2014. I don't see that it has been evaluated since. Given nature of procedure, I notified Jessica at Dr. Cleotilde Neer office who will relay message to him.  Patient with headaches and some reaccumulation of SDH since 08/04/21 surgery per neurosurgery notes, and embolization of right middle meningeal artery recommended. She is overdue for EP office visit, but appears to be compliant with remote interrogation and normal device function with 55% battery life as of 08/31/21. Awaiting perioperative cardiac device prescription for [facility]. Daughter will hopefully have more details regarding her  brother's (patient's son) fever after surgery in 2020, although patient had triggering agent for  08/04/21 procedure. Dr. Kathyrn Sheriff to be notified of known carotid artery disease. Above reviewed with anesthesiologist Myrtie Soman, MD. Anesthesia team to evaluate on the day of procedure.  She is for labs on arrival.    VS:  BP Readings from Last 3 Encounters:  08/10/21 115/64  07/27/21 (!) 146/98  07/19/21 (!) 171/80   Pulse Readings from Last 3 Encounters:  08/10/21 90  07/27/21 82  07/19/21 69     PROVIDERS: [name], NP is PCP ([place]) Thompson Grayer, MD is EP cardiologist. Last visit Chanetta Marshall, NP 08/21/17, as it appears her 03/10/19 visit with Dr. Rayann Heman had to be rescheduled but then never happened. She has been doing remote transmission of her PPM.   LABS: She is for updated labs on arrival. Most recent results include: Lab Results  Component Value Date   WBC 6.0 08/08/2021   HGB 9.6 (L) 08/08/2021   HCT 29.1 (L) 08/08/2021   PLT 286 08/08/2021   GLUCOSE 109 (H) 08/08/2021   CHOL 125 08/03/2021   TRIG 114 08/05/2021   HDL 34 (L) 08/03/2021   LDLCALC 71 08/03/2021   ALT 9 08/03/2021   AST 14 (L) 08/03/2021   NA 137 08/08/2021   K 3.1 (L) 08/08/2021   CL 104 08/08/2021   CREATININE 0.56 08/08/2021   BUN 7 (L) 08/08/2021   CO2 26 08/08/2021   TSH 1.69 03/15/2020   INR 1.0 08/03/2021   HGBA1C 6.2 (H) 08/04/2021      IMAGES: CT Head 10/41/22: IMPRESSION: 1. Unchanged right and decreased chronic left subdural hematomas. 2. Unchanged position of left frontal approach shunt catheter.   EKG: 08/03/21 21:32:18: Sinus rhythm Abnormal R-wave progression, early transition Baseline wander Abnormal ECG Confirmed by Carmin Muskrat [phone]) on 08/04/2021 11:10:09 PM   CV: Nuclear stress test 05/07/14: Overall Impression:  Normal stress nuclear study. LV Wall Motion:  NL LV Function, EF 77%; NL Wall Motion    Echo 03/31/13: Study Conclusions  - Left ventricle: The cavity size was normal. Wall thickness    was increased in a pattern of  mild LVH. There was mild    concentric hypertrophy. Systolic function was normal. The    estimated ejection fraction was in the range of 55% to    60%. Wall motion was normal; there were no regional wall    motion abnormalities. Doppler parameters are consistent    with abnormal left ventricular relaxation (grade 1    diastolic dysfunction).  - Mitral valve: Calcified annulus. Moderately calcified    leaflets posterior. Mild regurgitation. Valve area by    pressure half-time: 2.22cm^2.  - Atrial septum: No defect or patent foramen ovale was    identified.  Impressions:  - Overriding PTVP with sinus rhythm. No intraventricular    dyssyndhrony.     US Carotid 03/31/13: Summary:  Right Bulb and Proximal ICA: 0-49% diameter reduction. Velocities suggest low to mid range. Left Bulb and Proximal ICA: 50-69% diameter reduction. Velocities suggest low to mid range.    Tilt table test 07/12/06: - The upright tilt table test is positive for vasodepressor response and profound hypotension with presyncope.  There was no loss of consciousness or syncope during this test. - Further workup is warranted as it appears that this patient has a component of both bradycardia and vasodepression that may account for or at least be seriously contributing to her recurrent syncopal  episodes.  After her MRI, CT, and shunt studies are done and no significant abnormality, we would recommend permanent pacemaker implantation in this setting.  - s/p PPM 07/12/06   Past Medical History:  Diagnosis Date   Anxiety and depression    Hx of   Blepharospasm     Breast cancer ([place]) 1987   s/p lumpectomy.   Carotid bruit    Bilateral. Mild to moderate disease on LEFT and mild on the RIGHT in 2012   COPD (chronic obstructive pulmonary disease) (HCC)    mild. Not on inhalers.   Diabetes mellitus without complication ([place])    AODM   Fuchs' corneal dystrophy    GERD (gastroesophageal reflux disease)     Headache     Hyperlipidemia    Hypertension     Insomnia     Intracerebral bleed ([place]) 2003   Remote intracerebral blled and coiling by Dr. Estanislado Pandy for cerebral aneurysm and a shunt placed by Dr. Vertell Limber remotely. No seizure disorder and this happened in 2003 with an anterior communicating aneurysm.   Meningioma ([place])    left parafalcine calcified meningioma   Neurocardiogenic syncope    a. s/p Biotronik dual chamber pacemaker   Pacemaker    Personal history of radiation therapy    S/P VP shunt 2003   Sinus node dysfunction (HCC)     Tobacco abuse     Type II or unspecified type diabetes mellitus without mention of complication, not stated as uncontrolled      Past Surgical History:  Procedure Laterality Date   ANEURYSM COILING  2003   Cerebral aneurysm ;Dr. Luanne Bras   BREAST LUMPECTOMY Right    For cancer, no recurrence   CHOLECYSTECTOMY     CRANIOTOMY Right 08/04/2021   Procedure: BURR HOLES FOR SUBDURAL HEMATOMA;  Surgeon: Judith Part, MD;  Location: [facility];  Service: Neurosurgery;  Laterality: Right;   PACEMAKER GENERATOR CHANGE N/A 09/25/2014   BTK dual chamber pacemkaer implanted by Dr Rayann Heman   PACEMAKER INSERTION  07/12/2006   [employer] Jude Victory XL DR  -  Dr. Tami Ribas   PARTIAL HYSTERECTOMY     VENTRICULOPERITONEAL SHUNT  2003   Dr. Erline Levine    MEDICATIONS: No current facility-administered medications for this encounter.    Artificial Tear Ointment (DRY EYES OP)   buPROPion (WELLBUTRIN SR) 150 MG 12 hr tablet   clonazePAM (KLONOPIN) 1 MG tablet   losartan (COZAAR) 100 MG tablet   metFORMIN (GLUCOPHAGE) 500 MG tablet   pantoprazole (PROTONIX) 40 MG tablet   traMADol (ULTRAM) 50 MG tablet   Blood Glucose Monitoring Suppl (ONE TOUCH ULTRA 2) w/Device KIT   polyethylene glycol (MIRALAX / GLYCOLAX) 17 g packet    Myra Gianotti, PA-C Surgical Short Stay/Anesthesiology [facility] Phone [phone] [facility] Phone [phone] 09/29/2021 4:46 PM

## 2021-09-29 NOTE — Progress Notes (Signed)
Spoke with pt's daughter, Maudie Mercury for pre-op call. DPR on file. Pt has hx of Sinus node dysfunction and has a pacemaker. Sent message to the Device clinic for instructions. Maudie Mercury states pt has not had any recent chest pain or shortness of breath. Pt is diabetic, last A1C was 6.2 on 08/04/21. Instructed Kim to have pt not take her Metformin in the AM. Instructed her to have pt check her blood sugar when she wakes up in the AM and every 2 hours until she leaves for the hospital. If blood sugar is 70 or below, treat with 1/2 cup of clear juice (apple or cranberry) and recheck blood sugar 15 minutes after drinking juice. If blood sugar continues to be 70 or below, call the Short Stay department and ask to speak to a nurse. Kim voiced understanding.   I asked Maudie Mercury if there was any family hx of anesthesia complications, she stated that her brother "had something happen when he had surgery in December 2020, he had a fever of 107". When I asked her if malignant hyperthermia sounded familiar, she says she thinks so, but is not sure. I asked her to get her brothers information and bring it in the AM. She voiced understanding.   Chart sent to Anesthesia PA.

## 2021-09-29 NOTE — Progress Notes (Signed)
Pt has a Hydrographic surveyor. The Device Clinic instructions state they cannot give clearance due to the fact that the patient has not seen the EP physician since 2020. I call Ruby Cola, our [employer] rep and he is planning to be here around 12 noon to interrogate the pacemaker prior to pt's procedure.

## 2021-09-29 NOTE — Anesthesia Preprocedure Evaluation (Addendum)
Anesthesia Evaluation  Patient identified by MRN, date of birth, ID band Patient awake    Reviewed: Allergy & Precautions, NPO status , Patient's Chart, lab work & pertinent test results  Airway Mallampati: II  TM Distance: >3 FB Neck ROM: Full    Dental  (+) Dental Advisory Given, Edentulous Upper, Missing   Pulmonary COPD, Current Smoker,    Pulmonary exam normal breath sounds clear to auscultation       Cardiovascular hypertension, Pt. on medications Normal cardiovascular exam+ pacemaker  Rhythm:Regular Rate:Normal  Pacer interrogation 09/30/2021 Normal device function. AS-VS 67%, AP -VS 33%, VS 100%   Neuro/Psych  Headaches, PSYCHIATRIC DISORDERS Anxiety Depression  Neuromuscular disease    GI/Hepatic Neg liver ROS, GERD  ,  Endo/Other  negative endocrine ROSdiabetes  Renal/GU negative Renal ROS     Musculoskeletal negative musculoskeletal ROS (+)   Abdominal   Peds  Hematology negative hematology ROS (+)   Anesthesia Other Findings   Reproductive/Obstetrics                                                            Anesthesia Evaluation  Patient identified by MRN, date of birth, ID band Patient unresponsive    Reviewed: Allergy & Precautions, NPO status , Patient's Chart, lab work & pertinent test results  History of Anesthesia Complications Negative for: history of anesthetic complications  Airway Mallampati: II       Dental  (+) Upper Dentures, Dental Advisory Given   Pulmonary COPD, Current Smoker,    Pulmonary exam normal        Cardiovascular hypertension,  Rhythm:Regular Rate:Normal + Systolic murmurs    Neuro/Psych PSYCHIATRIC DISORDERS Anxiety Depression    GI/Hepatic negative GI ROS, Neg liver ROS,   Endo/Other  diabetes  Renal/GU negative Renal ROS     Musculoskeletal negative musculoskeletal ROS (+)   Abdominal   Peds   Hematology negative hematology ROS (+)   Anesthesia Other Findings   Reproductive/Obstetrics                            Anesthesia Physical Anesthesia Plan  ASA: 4 and emergent  Anesthesia Plan: General   Post-op Pain Management:    Induction: Intravenous  PONV Risk Score and Plan:   Airway Management Planned: Oral ETT  Additional Equipment: Arterial line  Intra-op Plan:   Post-operative Plan: Post-operative intubation/ventilation  Informed Consent:     Consent reviewed with POA and Dental advisory given  Plan Discussed with: Anesthesiologist and CRNA  Anesthesia Plan Comments:         Anesthesia Quick Evaluation  Anesthesia Physical Anesthesia Plan  ASA: 3  Anesthesia Plan: General   Post-op Pain Management:    Induction: Intravenous  PONV Risk Score and Plan: 2 and Ondansetron, Dexamethasone and Treatment may vary due to age or medical condition  Airway Management Planned: Oral ETT  Additional Equipment: Arterial line  Intra-op Plan:   Post-operative Plan: Extubation in OR  Informed Consent: I have reviewed the patients History and Physical, chart, labs and discussed the procedure including the risks, benefits and alternatives for the proposed anesthesia with the patient or authorized representative who has indicated his/her understanding and acceptance.     Dental advisory given  Plan Discussed with:  CRNA  Anesthesia Plan Comments: (Magnet available   See PAT note written 09/29/2021 by Myra Gianotti, PA-C. Same day work-up.      Patient with headaches and some reaccumulation of SDH since 08/04/21 surgery per neurosurgery notes, and embolization of right middle meningeal artery recommended. She is overdue for EP office visit, but appears to be compliant with remote interrogation and normal device function with 55% battery life of her Biotronik PPM as of 08/31/21. Awaiting perioperative cardiac device  prescription from [facility]. Daughter will hopefully have more details regarding her brother's (patient's son) high fever with surgery in 2020, although patient had triggering agent for 08/04/21 procedure. Dr. Kathyrn Sheriff to be notified of known carotid artery disease that does not appear to have been evaluated since 2014.   )      Anesthesia Quick Evaluation

## 2021-09-30 ENCOUNTER — Encounter (HOSPITAL_COMMUNITY)
Admission: RE | Disposition: A | Discharge status: Home / Self Care | Payer: Self-pay | Source: Home / Self Care | Attending: Neurosurgery

## 2021-09-30 ENCOUNTER — Inpatient Hospital Stay (HOSPITAL_COMMUNITY)
Admission: RE | Admit: 2021-09-30 | Discharge: 2021-10-01 | DRG: 022 | Disposition: A | Discharge status: Home / Self Care | Payer: Medicare Other | Attending: Neurosurgery | Admitting: Neurosurgery

## 2021-09-30 ENCOUNTER — Inpatient Hospital Stay (HOSPITAL_COMMUNITY): Payer: Medicare Other | Admitting: Vascular Surgery

## 2021-09-30 ENCOUNTER — Inpatient Hospital Stay (HOSPITAL_COMMUNITY)
Admission: RE | Admit: 2021-09-30 | Discharge: 2021-09-30 | Disposition: A | Discharge status: Home / Self Care | Payer: Medicare Other | Source: Ambulatory Visit | Attending: Neurosurgery | Admitting: Neurosurgery

## 2021-09-30 ENCOUNTER — Encounter (HOSPITAL_COMMUNITY): Payer: Self-pay | Admitting: Neurosurgery

## 2021-09-30 DIAGNOSIS — S065XAA Traumatic subdural hemorrhage with loss of consciousness status unknown, initial encounter: Secondary | ICD-10-CM

## 2021-09-30 DIAGNOSIS — Z853 Personal history of malignant neoplasm of breast: Secondary | ICD-10-CM

## 2021-09-30 DIAGNOSIS — S065X9A Traumatic subdural hemorrhage with loss of consciousness of unspecified duration, initial encounter: Secondary | ICD-10-CM | POA: Diagnosis not present

## 2021-09-30 DIAGNOSIS — Z888 Allergy status to other drugs, medicaments and biological substances status: Secondary | ICD-10-CM

## 2021-09-30 DIAGNOSIS — K219 Gastro-esophageal reflux disease without esophagitis: Secondary | ICD-10-CM | POA: Diagnosis not present

## 2021-09-30 DIAGNOSIS — F32A Depression, unspecified: Secondary | ICD-10-CM | POA: Diagnosis present

## 2021-09-30 DIAGNOSIS — E785 Hyperlipidemia, unspecified: Secondary | ICD-10-CM | POA: Diagnosis present

## 2021-09-30 DIAGNOSIS — Z982 Presence of cerebrospinal fluid drainage device: Secondary | ICD-10-CM | POA: Diagnosis not present

## 2021-09-30 DIAGNOSIS — F17210 Nicotine dependence, cigarettes, uncomplicated: Secondary | ICD-10-CM | POA: Diagnosis not present

## 2021-09-30 DIAGNOSIS — I495 Sick sinus syndrome: Secondary | ICD-10-CM | POA: Diagnosis not present

## 2021-09-30 DIAGNOSIS — E119 Type 2 diabetes mellitus without complications: Secondary | ICD-10-CM | POA: Diagnosis present

## 2021-09-30 DIAGNOSIS — Z923 Personal history of irradiation: Secondary | ICD-10-CM | POA: Diagnosis not present

## 2021-09-30 DIAGNOSIS — I10 Essential (primary) hypertension: Secondary | ICD-10-CM | POA: Diagnosis present

## 2021-09-30 DIAGNOSIS — Z7984 Long term (current) use of oral hypoglycemic drugs: Secondary | ICD-10-CM | POA: Diagnosis not present

## 2021-09-30 DIAGNOSIS — I6201 Nontraumatic acute subdural hemorrhage: Principal | ICD-10-CM | POA: Diagnosis present

## 2021-09-30 DIAGNOSIS — Z9889 Other specified postprocedural states: Secondary | ICD-10-CM

## 2021-09-30 DIAGNOSIS — Z20822 Contact with and (suspected) exposure to covid-19: Secondary | ICD-10-CM | POA: Diagnosis present

## 2021-09-30 DIAGNOSIS — Z79899 Other long term (current) drug therapy: Secondary | ICD-10-CM

## 2021-09-30 DIAGNOSIS — Z950 Presence of cardiac pacemaker: Secondary | ICD-10-CM

## 2021-09-30 DIAGNOSIS — J449 Chronic obstructive pulmonary disease, unspecified: Secondary | ICD-10-CM | POA: Diagnosis present

## 2021-09-30 DIAGNOSIS — F419 Anxiety disorder, unspecified: Secondary | ICD-10-CM | POA: Diagnosis not present

## 2021-09-30 DIAGNOSIS — I619 Nontraumatic intracerebral hemorrhage, unspecified: Principal | ICD-10-CM | POA: Diagnosis present

## 2021-09-30 DIAGNOSIS — G4700 Insomnia, unspecified: Secondary | ICD-10-CM | POA: Diagnosis present

## 2021-09-30 DIAGNOSIS — I6200 Nontraumatic subdural hemorrhage, unspecified: Secondary | ICD-10-CM | POA: Diagnosis not present

## 2021-09-30 HISTORY — DX: Headache, unspecified: R51.9

## 2021-09-30 HISTORY — PX: IR TRANSCATH/EMBOLIZ: IMG695

## 2021-09-30 HISTORY — PX: RADIOLOGY WITH ANESTHESIA: SHX6223

## 2021-09-30 HISTORY — PX: IR ANGIO INTRA EXTRACRAN SEL INTERNAL CAROTID UNI R MOD SED: IMG5362

## 2021-09-30 HISTORY — PX: IR ANGIO VERTEBRAL SEL VERTEBRAL UNI R MOD SED: IMG5368

## 2021-09-30 LAB — BASIC METABOLIC PANEL
Anion gap: 10 (ref 5–15)
BUN: 11 mg/dL (ref 8–23)
CO2: 24 mmol/L (ref 22–32)
Calcium: 8.9 mg/dL (ref 8.9–10.3)
Chloride: 102 mmol/L (ref 98–111)
Creatinine, Ser: 0.83 mg/dL (ref 0.44–1.00)
GFR, Estimated: >60 mL/min (ref >60)
Glucose, Bld: 99 mg/dL (ref 70–99)
Potassium: 3.9 mmol/L (ref 3.5–5.1)
Sodium: 136 mmol/L (ref 135–145)

## 2021-09-30 LAB — URINALYSIS, ROUTINE W REFLEX MICROSCOPIC
Bilirubin Urine: NEGATIVE
Glucose, UA: NEGATIVE mg/dL
Hgb urine dipstick: NEGATIVE
Ketones, ur: 5 mg/dL — AB
Leukocytes,Ua: NEGATIVE
Nitrite: NEGATIVE
Protein, ur: NEGATIVE mg/dL
Specific Gravity, Urine: 1.02 (ref 1.005–1.030)
pH: 5 (ref 5.0–8.0)

## 2021-09-30 LAB — GLUCOSE, CAPILLARY
Glucose-Capillary: 107 mg/dL — ABNORMAL HIGH (ref 70–99)
Glucose-Capillary: 94 mg/dL (ref 70–99)
Glucose-Capillary: 89 mg/dL (ref 70–99)
Glucose-Capillary: 94 mg/dL (ref 70–99)
Glucose-Capillary: 93 mg/dL (ref 70–99)

## 2021-09-30 LAB — CBC WITH DIFFERENTIAL/PLATELET
Abs Immature Granulocytes: 0.04 10*3/uL (ref 0.00–0.07)
Basophils Absolute: 0.1 10*3/uL (ref 0.0–0.1)
Basophils Relative: 1 %
Eosinophils Absolute: 0 10*3/uL (ref 0.0–0.5)
Eosinophils Relative: 0 %
HCT: 33.8 % — ABNORMAL LOW (ref 36.0–46.0)
Hemoglobin: 11.1 g/dL — ABNORMAL LOW (ref 12.0–15.0)
Immature Granulocytes: 1 %
Lymphocytes Relative: 20 %
Lymphs Abs: 1.7 10*3/uL (ref 0.7–4.0)
MCH: 30.1 pg (ref 26.0–34.0)
MCHC: 32.8 g/dL (ref 30.0–36.0)
MCV: 91.6 fL (ref 80.0–100.0)
Monocytes Absolute: 0.6 10*3/uL (ref 0.1–1.0)
Monocytes Relative: 7 %
Neutro Abs: 6.2 10*3/uL (ref 1.7–7.7)
Neutrophils Relative %: 71 %
Platelets: 263 10*3/uL (ref 150–400)
RBC: 3.69 MIL/uL — ABNORMAL LOW (ref 3.87–5.11)
RDW: 13.2 % (ref 11.5–15.5)
WBC: 8.6 10*3/uL (ref 4.0–10.5)
nRBC: 0 % (ref 0.0–0.2)

## 2021-09-30 LAB — PROTIME-INR
INR: 1.1 (ref 0.8–1.2)
Prothrombin Time: 13.9 seconds (ref 11.4–15.2)

## 2021-09-30 LAB — MRSA NEXT GEN BY PCR, NASAL: MRSA by PCR Next Gen: NOT DETECTED

## 2021-09-30 LAB — APTT: aPTT: 32 seconds (ref 24–36)

## 2021-09-30 LAB — SARS CORONAVIRUS 2 BY RT PCR (HOSPITAL ORDER, PERFORMED IN ~~LOC~~ HOSPITAL LAB): SARS Coronavirus 2: NEGATIVE

## 2021-09-30 SURGERY — IR WITH ANESTHESIA
Anesthesia: General

## 2021-09-30 MED ORDER — ROCURONIUM BROMIDE 10 MG/ML (PF) SYRINGE
PREFILLED_SYRINGE | INTRAVENOUS | Status: DC | PRN
  Administered 2021-09-30: 30 mg via INTRAVENOUS
  Administered 2021-09-30: 50 mg via INTRAVENOUS

## 2021-09-30 MED ORDER — FENTANYL CITRATE (PF) 100 MCG/2ML IJ SOLN
INTRAMUSCULAR | Status: AC
  Filled 2021-09-30: qty 2

## 2021-09-30 MED ORDER — ONDANSETRON HCL 4 MG/2ML IJ SOLN: 4.0000 mg | INTRAMUSCULAR | Status: DC | PRN

## 2021-09-30 MED ORDER — IOHEXOL 300 MG/ML  SOLN
100.0000 mL | Freq: Once | INTRAMUSCULAR | Status: AC | PRN
  Administered 2021-09-30: 35 mL via INTRA_ARTERIAL

## 2021-09-30 MED ORDER — CHLORHEXIDINE GLUCONATE 0.12 % MT SOLN: 15.0000 mL | Freq: Once | OROMUCOSAL | Status: AC

## 2021-09-30 MED ORDER — ORAL CARE MOUTH RINSE: 15.0000 mL | Freq: Once | OROMUCOSAL | Status: AC

## 2021-09-30 MED ORDER — LOSARTAN POTASSIUM 50 MG PO TABS
50.0000 mg | ORAL_TABLET | Freq: Two times a day (BID) | ORAL | Status: DC
  Administered 2021-09-30 – 2021-10-01 (×2): 50 mg via ORAL
  Filled 2021-09-30 (×2): qty 1

## 2021-09-30 MED ORDER — CHLORHEXIDINE GLUCONATE CLOTH 2 % EX PADS
6.0000 | MEDICATED_PAD | Freq: Every day | CUTANEOUS | Status: DC
  Administered 2021-09-30 – 2021-10-01 (×2): 6 via TOPICAL

## 2021-09-30 MED ORDER — HEPARIN SODIUM (PORCINE) 1000 UNIT/ML IJ SOLN
INTRAMUSCULAR | Status: DC | PRN
  Administered 2021-09-30: 2000 [IU] via INTRAVENOUS

## 2021-09-30 MED ORDER — CEFAZOLIN SODIUM-DEXTROSE 2-4 GM/100ML-% IV SOLN
INTRAVENOUS | Status: AC
  Filled 2021-09-30: qty 100

## 2021-09-30 MED ORDER — NOREPINEPHRINE 4 MG/250ML-% IV SOLN
0.0000 ug/min | INTRAVENOUS | Status: DC
Start: 2021-09-30 — End: 2021-09-30

## 2021-09-30 MED ORDER — LACTATED RINGERS IV SOLN: INTRAVENOUS | Status: DC

## 2021-09-30 MED ORDER — VASOPRESSIN 20 UNITS/100 ML INFUSION FOR SHOCK
0.0000 [IU]/min | INTRAVENOUS | Status: DC
Start: 2021-09-30 — End: 2021-09-30

## 2021-09-30 MED ORDER — SUGAMMADEX SODIUM 200 MG/2ML IV SOLN
INTRAVENOUS | Status: DC | PRN
  Administered 2021-09-30: 300 mg via INTRAVENOUS

## 2021-09-30 MED ORDER — HEPARIN SODIUM (PORCINE) 5000 UNIT/ML IJ SOLN: 5000.0000 [IU] | Freq: Three times a day (TID) | INTRAMUSCULAR | Status: DC

## 2021-09-30 MED ORDER — ESMOLOL HCL 100 MG/10ML IV SOLN
INTRAVENOUS | Status: DC | PRN
  Administered 2021-09-30: 10 mg via INTRAVENOUS
  Administered 2021-09-30: 20 mg via INTRAVENOUS

## 2021-09-30 MED ORDER — INSULIN ASPART 100 UNIT/ML IJ SOLN
0.0000 [IU] | Freq: Three times a day (TID) | INTRAMUSCULAR | Status: DC
  Administered 2021-10-01: 1 [IU] via SUBCUTANEOUS
  Administered 2021-10-01: 2 [IU] via SUBCUTANEOUS

## 2021-09-30 MED ORDER — CEFAZOLIN SODIUM-DEXTROSE 2-4 GM/100ML-% IV SOLN
2.0000 g | INTRAVENOUS | Status: AC
  Administered 2021-09-30: 2 g via INTRAVENOUS

## 2021-09-30 MED ORDER — PANTOPRAZOLE SODIUM 40 MG PO TBEC
40.0000 mg | DELAYED_RELEASE_TABLET | Freq: Every day | ORAL | Status: DC
  Administered 2021-10-01: 40 mg via ORAL
  Filled 2021-09-30: qty 1

## 2021-09-30 MED ORDER — POLYETHYLENE GLYCOL 3350 17 G PO PACK: 17.0000 g | PACK | Freq: Every day | ORAL | Status: DC | PRN

## 2021-09-30 MED ORDER — PHENYLEPHRINE HCL-NACL 20-0.9 MG/250ML-% IV SOLN
INTRAVENOUS | Status: DC | PRN
  Administered 2021-09-30: 20 ug/min via INTRAVENOUS

## 2021-09-30 MED ORDER — ORAL CARE MOUTH RINSE
15.0000 mL | Freq: Two times a day (BID) | OROMUCOSAL | Status: DC
  Administered 2021-09-30 – 2021-10-01 (×2): 15 mL via OROMUCOSAL

## 2021-09-30 MED ORDER — METFORMIN HCL 500 MG PO TABS
500.0000 mg | ORAL_TABLET | Freq: Two times a day (BID) | ORAL | Status: DC
  Administered 2021-10-01 (×2): 500 mg via ORAL
  Filled 2021-09-30 (×2): qty 1

## 2021-09-30 MED ORDER — ONDANSETRON HCL 4 MG/2ML IJ SOLN
INTRAMUSCULAR | Status: DC | PRN
  Administered 2021-09-30: 4 mg via INTRAVENOUS

## 2021-09-30 MED ORDER — TRAMADOL HCL 50 MG PO TABS
50.0000 mg | ORAL_TABLET | Freq: Four times a day (QID) | ORAL | Status: DC | PRN
  Administered 2021-10-01 (×3): 50 mg via ORAL
  Filled 2021-09-30 (×3): qty 1

## 2021-09-30 MED ORDER — CHLORHEXIDINE GLUCONATE 0.12 % MT SOLN
OROMUCOSAL | Status: AC
  Administered 2021-09-30: 15 mL
  Filled 2021-09-30: qty 15

## 2021-09-30 MED ORDER — BUPROPION HCL ER (SR) 150 MG PO TB12
150.0000 mg | ORAL_TABLET | Freq: Every day | ORAL | Status: DC
  Administered 2021-10-01: 150 mg via ORAL
  Filled 2021-09-30: qty 1

## 2021-09-30 MED ORDER — DEXAMETHASONE SODIUM PHOSPHATE 10 MG/ML IJ SOLN
INTRAMUSCULAR | Status: DC | PRN
  Administered 2021-09-30: 10 mg via INTRAVENOUS

## 2021-09-30 MED ORDER — ONDANSETRON HCL 4 MG PO TABS: 4.0000 mg | ORAL_TABLET | ORAL | Status: DC | PRN

## 2021-09-30 MED ORDER — DROPERIDOL 2.5 MG/ML IJ SOLN: 0.6250 mg | Freq: Once | INTRAMUSCULAR | Status: DC | PRN

## 2021-09-30 MED ORDER — FENTANYL CITRATE (PF) 100 MCG/2ML IJ SOLN: 25.0000 ug | INTRAMUSCULAR | Status: DC | PRN

## 2021-09-30 MED ORDER — CHLORHEXIDINE GLUCONATE CLOTH 2 % EX PADS: 6.0000 | MEDICATED_PAD | Freq: Once | CUTANEOUS | Status: DC

## 2021-09-30 MED ORDER — PROPOFOL 10 MG/ML IV BOLUS
INTRAVENOUS | Status: DC | PRN
  Administered 2021-09-30 (×2): 50 mg via INTRAVENOUS

## 2021-09-30 MED ORDER — FENTANYL CITRATE (PF) 100 MCG/2ML IJ SOLN
INTRAMUSCULAR | Status: DC | PRN
  Administered 2021-09-30 (×2): 50 ug via INTRAVENOUS

## 2021-09-30 MED ORDER — LIDOCAINE 2% (20 MG/ML) 5 ML SYRINGE
INTRAMUSCULAR | Status: DC | PRN
  Administered 2021-09-30: 20 mg via INTRAVENOUS

## 2021-09-30 MED ORDER — LABETALOL HCL 5 MG/ML IV SOLN: 10.0000 mg | INTRAVENOUS | Status: DC | PRN

## 2021-09-30 NOTE — Sedation Documentation (Addendum)
Right femoral sheath removed. Manual pressure applied being held by [employer].

## 2021-09-30 NOTE — Transfer of Care (Signed)
Immediate Anesthesia Transfer of Care Note  Patient: Mary Higgins  Procedure(s) Performed: Arteriogram, Onyx embolization of right middle meningeal artery  Patient Location: PACU  Anesthesia Type:General  Level of Consciousness: awake, alert  and oriented  Airway & Oxygen Therapy: Patient Spontanous Breathing and Patient connected to nasal cannula oxygen  Post-op Assessment: Report given to RN, Post -op Vital signs reviewed and stable and Patient moving all extremities  Post vital signs: Reviewed and stable  Last Vitals:  Vitals Value Taken Time  BP 144/105 09/30/21 2020  Temp 36.1 C 09/30/21 2020  Pulse 95 09/30/21 2020  Resp 21 09/30/21 2030  SpO2 99 % 09/30/21 2020  Vitals shown include unvalidated device data.  Last Pain:  Vitals:   09/30/21 1206  TempSrc:   PainSc: 0-No pain         Complications: No notable events documented.

## 2021-09-30 NOTE — Anesthesia Procedure Notes (Signed)
Arterial Line Insertion Start/End11/18/2022 2:35 PM, 09/30/2021 2:55 PM Performed by: Nolon Nations, MD  Patient location: Pre-op. Preanesthetic checklist: patient identified, IV checked, site marked, risks and benefits discussed, surgical consent, monitors and equipment checked, pre-op evaluation, timeout performed and anesthesia consent Lidocaine 1% used for infiltration Right, radial was placed Catheter size: 20 G Hand hygiene performed  and maximum sterile barriers used   Attempts: 4 Procedure performed using ultrasound guided technique. Ultrasound Notes:anatomy identified, needle tip was noted to be adjacent to the nerve/plexus identified, no ultrasound evidence of intravascular and/or intraneural injection and image(s) printed for medical record Following insertion, dressing applied and Biopatch. Post procedure assessment: normal and unchanged  Post procedure complications: second provider assisted. Patient tolerated the procedure with difficulty.

## 2021-09-30 NOTE — Anesthesia Procedure Notes (Signed)
Arterial Line Insertion Start/End11/18/2022 6:30 PM, 09/30/2021 6:40 PM Performed by: Murvin Natal, MD, anesthesiologist  Patient location: OOR procedure area. Preanesthetic checklist: patient identified, IV checked, site marked, risks and benefits discussed, surgical consent, monitors and equipment checked, pre-op evaluation, timeout performed and anesthesia consent Patient sedated Right, brachial was placed Catheter size: 20 G Hand hygiene performed , maximum sterile barriers used  and Seldinger technique used  Attempts: 2 (Unsuccessful attempt at high radial in the mid-forearm) Procedure performed using ultrasound guided technique. Ultrasound Notes:anatomy identified, needle tip was noted to be adjacent to the nerve/plexus identified and no ultrasound evidence of intravascular and/or intraneural injection Following insertion, dressing applied and Biopatch. Post procedure assessment: normal and unchanged  Patient tolerated the procedure well with no immediate complications.

## 2021-09-30 NOTE — Brief Op Note (Signed)
  NEUROSURGERY BRIEF OPERATIVE  NOTE   PREOP DX: Right subdural hematoma  POSTOP DX: Same  PROCEDURE: Onyx embolization of right middle meningeal artery  SURGEON: Dr. Consuella Lose, MD  ANESTHESIA: GETA  EBL: Minimal  SPECIMENS: None  COMPLICATIONS: None  CONDITION: Stable to recovery  FINDINGS (Full report in [facility]): 1. Successful onyx embolization of right MMA   Consuella Lose, MD [facility]

## 2021-09-30 NOTE — Anesthesia Procedure Notes (Signed)
Procedure Name: Intubation Date/Time: 09/30/2021 6:37 PM Performed by: Georgia Duff, CRNA Pre-anesthesia Checklist: Patient identified, Emergency Drugs available, Suction available and Patient being monitored Patient Re-evaluated:Patient Re-evaluated prior to induction Oxygen Delivery Method: Circle System Utilized Preoxygenation: Pre-oxygenation with 100% oxygen Induction Type: IV induction Ventilation: Mask ventilation without difficulty Laryngoscope Size: Miller Grade View: Grade I Tube type: Oral Tube size: 7.0 mm Number of attempts: 1 Airway Equipment and Method: Stylet and Oral airway Placement Confirmation: ETT inserted through vocal cords under direct vision, positive ETCO2 and breath sounds checked- equal and bilateral Secured at: 21 cm Tube secured with: Tape Dental Injury: Teeth and Oropharynx as per pre-operative assessment

## 2021-09-30 NOTE — H&P (Signed)
Chief Complaint  Headache  History of Present Illness  Mary Higgins is a 80 y.o. female with a history of right-sided subdural hematoma previously having undergone bur hole evacuation.  Patient has been followed in the outpatient neurosurgery clinic by my partner, Dr. Venetia Constable.  Repeat CT scan has demonstrated increased size of the now chronic subdural hematoma on the right.  Patient does have continued primarily headache.  She was therefore referred for right sided middle meningeal artery embolization.  Past Medical History   Past Medical History:  Diagnosis Date   Anxiety and depression    Hx of   Blepharospasm     Breast cancer ([place]) 1987   s/p lumpectomy.   Carotid bruit    Bilateral. Mild to moderate disease on LEFT and mild on the RIGHT in 2012   COPD (chronic obstructive pulmonary disease) (HCC)    mild. Not on inhalers.   Diabetes mellitus without complication ([place])    AODM   Fuchs' corneal dystrophy    GERD (gastroesophageal reflux disease)     Headache    Hyperlipidemia    Hypertension     Insomnia     Intracerebral bleed ([place]) 2003   Remote intracerebral blled and coiling by Dr. Estanislado Pandy for cerebral aneurysm and a shunt placed by Dr. Vertell Limber remotely. No seizure disorder and this happened in 2003 with an anterior communicating aneurysm.   Meningioma ([place])    left parafalcine calcified meningioma   Neurocardiogenic syncope    a. s/p Biotronik dual chamber pacemaker   Pacemaker    Personal history of radiation therapy    S/P VP shunt 2003   Sinus node dysfunction (HCC)     Tobacco abuse     Type II or unspecified type diabetes mellitus without mention of complication, not stated as uncontrolled      Past Surgical History   Past Surgical History:  Procedure Laterality Date   ANEURYSM COILING  2003   Cerebral aneurysm ;Dr. Luanne Bras   BREAST LUMPECTOMY Right    For cancer, no recurrence   CHOLECYSTECTOMY     CRANIOTOMY Right 08/04/2021    Procedure: BURR HOLES FOR SUBDURAL HEMATOMA;  Surgeon: Judith Part, MD;  Location: [facility];  Service: Neurosurgery;  Laterality: Right;   PACEMAKER GENERATOR CHANGE N/A 09/25/2014   BTK dual chamber pacemkaer implanted by Dr Rayann Heman   PACEMAKER INSERTION  07/12/2006   [employer] Victory XL DR  -  Dr. Tami Ribas   PARTIAL HYSTERECTOMY     VENTRICULOPERITONEAL SHUNT  2003   Dr. Erline Levine    Social History   Social History   Tobacco Use   Smoking status: Some Days    Years: 15.00    Types: Cigarettes    Start date: 01/12/2019   Smokeless tobacco: Never   Tobacco comments:    Smokes 1-3 cig daily as of 07/2021. Started as a teenager, smoked 1 ppd, but then quit cold [place] x years. Restarted 2019.  Vaping Use   Vaping Use: Never used  Substance Use Topics   Alcohol use: No    Alcohol/week: 0.0 standard drinks   Drug use: Never    Medications   Prior to Admission medications   Medication Sig Start Date End Date Taking? Authorizing Provider  Artificial Tear Ointment (DRY EYES OP) Place 1 drop into both eyes daily as needed (dry eyes).   Yes [provider]  buPROPion (WELLBUTRIN SR) 150 MG 12 hr tablet Take 1 tablet (150 mg total)  by mouth daily. 07/28/21  Yes Ngetich, Dinah C, NP  clonazePAM (KLONOPIN) 1 MG tablet Take 1 tablet (1 mg total) by mouth 3 (three) times daily as needed. Patient taking differently: Take 1 mg by mouth 3 (three) times daily as needed for anxiety. 07/28/21  Yes Ngetich, Dinah C, NP  losartan (COZAAR) 100 MG tablet Take 1/2 tablet by mouth in the morning and Take 1/2 tablet by mouth in the evening. Patient taking differently: Take 50 mg by mouth 2 (two) times daily. Take 1/2 tablet by mouth in the morning and Take 1/2 tablet by mouth in the evening. 08/01/21  Yes Wardell Honour, MD  metFORMIN (GLUCOPHAGE) 500 MG tablet TAKE 1 TABLET BY MOUTH WITH BREAKFAST AND WITH SUPPER FOR DIABETES Patient taking differently: Take 500 mg by mouth 2 (two) times  daily with a meal. 05/25/21  Yes [name], NP  pantoprazole (PROTONIX) 40 MG tablet TAKE 1 TABLET(40 MG) BY MOUTH DAILY 08/19/21  Yes [name], NP  traMADol (ULTRAM) 50 MG tablet Take 50 mg by mouth every 8 (eight) hours as needed for headache. 09/26/21  Yes [provider]  Blood Glucose Monitoring Suppl (ONE TOUCH ULTRA 2) w/Device KIT USE UP TO FOUR TIMES DAILY AS DIRECTED 02/16/21   [name], NP  polyethylene glycol (MIRALAX / GLYCOLAX) 17 g packet Take 17 g by mouth daily as needed for mild constipation. Patient not taking: Reported on 09/29/2021 08/10/21   Nita Sells, MD    Allergies   Allergies  Allergen Reactions   Oxaprozin Rash    Review of Systems  ROS  Neurologic Exam  Awake, alert, oriented Memory and concentration grossly intact Speech fluent, appropriate CN grossly intact Motor exam: Upper Extremities Deltoid Bicep Tricep Grip  Right 5/5 5/5 5/5 5/5  Left 5/5 5/5 5/5 5/5   Lower Extremities IP Quad PF DF EHL  Right 5/5 5/5 5/5 5/5 5/5  Left 5/5 5/5 5/5 5/5 5/5   Sensation grossly intact to LT  Imaging  CT scan of the head dated 08/26/2021 was personally reviewed.  This demonstrates a relatively large convexity holohemispheric subdural hematoma on the right.  There is significant mass-effect on the right hemisphere.  Impression  - 80 y.o. female with enlarging right sided chronic subdural hematoma, neurologically intact but symptomatic with headache.  Plan  -We will plan on proceeding with angiogram and possible right middle meningeal artery embolization  I have reviewed the imaging findings with the patient and her family.  We have discussed options for treatment including the middle meningeal artery embolization.  I did review with them the risks of the procedure as well as the expected postoperative course and recovery.  All their questions were answered and she provided informed consent to proceed.  Consuella Lose, MD [facility]

## 2021-09-30 NOTE — Progress Notes (Signed)
[facility] Progress Note Patient Name: ANGELIYAH KIRKEY DOB: 16-Feb-1941 MRN: [id number]   Date of Service  09/30/2021  HPI/Events of Note  75F admitted for acute on chronic right subdural hematoma s/p embolization 09/30/21. Previously s/p bur hole evacuation with increased headache as outpatient. Increased R SDH mass effect per NSG on 08/26/21 imaging. Extubated post-procedure.Arrived in Neuro ICU post-embolization.   PMHx Sinus node dx, HTN, DM2, GERD, HLD  Camera exam: Patient resting in bed. Hemodynamically stable. SpO2 98% on RA   eICU Interventions  Available as needed     Intervention Category Evaluation Type: New Patient Evaluation  Luke Falero Rodman [place] 09/30/2021, 9:26 PM

## 2021-10-01 ENCOUNTER — Other Ambulatory Visit (HOSPITAL_COMMUNITY): Payer: Medicare Other

## 2021-10-01 NOTE — Anesthesia Postprocedure Evaluation (Signed)
Anesthesia Post Note  Patient: Mary Higgins  Procedure(s) Performed: Arteriogram, Onyx embolization of right middle meningeal artery     Patient location during evaluation: PACU Anesthesia Type: General Level of consciousness: awake Pain management: pain level controlled Vital Signs Assessment: post-procedure vital signs reviewed and stable Respiratory status: spontaneous breathing, nonlabored ventilation, respiratory function stable and patient connected to nasal cannula oxygen Cardiovascular status: blood pressure returned to baseline and stable Postop Assessment: no apparent nausea or vomiting Anesthetic complications: no   No notable events documented.  Last Vitals:  Vitals:   09/30/21 2230 09/30/21 2300  BP: 106/65 131/69  Pulse: (!) 101 91  Resp: 16 13  Temp:    SpO2: 94% 98%    Last Pain:  Vitals:   09/30/21 2100  TempSrc: Oral  PainSc: 0-No pain                 Teegan Guinther P Berenis Corter

## 2021-10-01 NOTE — Discharge Summary (Signed)
Physician Discharge Summary  Patient ID: Mary Higgins MRN: [id number] DOB/AGE: 05/12/1941 80 y.o.  Admit date: 09/30/2021 Discharge date: 10/01/2021  Admission Diagnoses: Recurrent subdural hemorrhages.  Discharge Diagnoses: Recurrent subdural hemorrhages. Principal Problem:   S/P coil embolization of cerebral aneurysm Active Problems:   Subdural hematoma   Discharged Condition: good  Hospital Course: Patient was admitted to undergo coil embolization of her middle meningeal artery which she tolerated well.  Consults: None  Significant Diagnostic Studies: None  Treatments: surgery: See procedure note  Discharge Exam: Blood pressure 100/84, pulse 99, temperature 98.2 F (36.8 C), temperature source Oral, resp. rate 18, height 5' 11" (1.803 m), weight 67.7 kg, SpO2 93 %. Neurologic status remained stable.  Puncture site is flattening clean  Disposition: Discharge disposition: 01-Home or Self Care       Discharge Instructions     Call MD for:  redness, tenderness, or signs of infection (pain, swelling, redness, odor or green/yellow discharge around incision site)   Complete by: As directed    Call MD for:  severe uncontrolled pain   Complete by: As directed    Call MD for:  temperature >100.4   Complete by: As directed    Diet - low sodium heart healthy   Complete by: As directed    Increase activity slowly   Complete by: As directed    No dressing needed   Complete by: As directed       Allergies as of 10/01/2021       Reactions   Oxaprozin Rash        Medication List     TAKE these medications    buPROPion 150 MG 12 hr tablet Commonly known as: WELLBUTRIN SR Take 1 tablet (150 mg total) by mouth daily.   clonazePAM 1 MG tablet Commonly known as: KLONOPIN Take 1 tablet (1 mg total) by mouth 3 (three) times daily as needed. What changed: reasons to take this   DRY EYES OP Place 1 drop into both eyes daily as needed (dry eyes).    losartan 100 MG tablet Commonly known as: COZAAR Take 1/2 tablet by mouth in the morning and Take 1/2 tablet by mouth in the evening. What changed:  how much to take how to take this when to take this   metFORMIN 500 MG tablet Commonly known as: GLUCOPHAGE TAKE 1 TABLET BY MOUTH WITH BREAKFAST AND WITH SUPPER FOR DIABETES What changed: See the new instructions.   ONE TOUCH ULTRA 2 w/Device Kit USE UP TO FOUR TIMES DAILY AS DIRECTED   pantoprazole 40 MG tablet Commonly known as: PROTONIX TAKE 1 TABLET(40 MG) BY MOUTH DAILY   polyethylene glycol 17 g packet Commonly known as: MIRALAX / GLYCOLAX Take 17 g by mouth daily as needed for mild constipation.   traMADol 50 MG tablet Commonly known as: ULTRAM Take 50 mg by mouth every 8 (eight) hours as needed for headache.               Discharge Care Instructions  (From admission, onward)           Start     Ordered   10/01/21 0000  No dressing needed        10/01/21 1911             Signed: Earleen Newport 10/01/2021, 7:12 PM

## 2021-10-03 ENCOUNTER — Telehealth: Payer: Self-pay | Admitting: *Deleted

## 2021-10-03 ENCOUNTER — Encounter (HOSPITAL_COMMUNITY): Payer: Self-pay | Admitting: Neurosurgery

## 2021-10-03 LAB — GLUCOSE, CAPILLARY
Glucose-Capillary: 108 mg/dL — ABNORMAL HIGH (ref 70–99)
Glucose-Capillary: 162 mg/dL — ABNORMAL HIGH (ref 70–99)
Glucose-Capillary: 147 mg/dL — ABNORMAL HIGH (ref 70–99)

## 2021-10-03 NOTE — Telephone Encounter (Signed)
Transition Care Management Follow-up Telephone Call Date of discharge and from where: 10/01/2021 [facility] How have you been since you were released from the hospital? Still Weak, Feels like she never should have been discharged home.  Any questions or concerns? No  Items Reviewed: Did the pt receive and understand the discharge instructions provided? Yes  Medications obtained and verified? Yes  Other? No  Any new allergies since your discharge? No  Dietary orders reviewed? Yes Do you have support at home? Yes   Home Care and Equipment/Supplies: Were home health services ordered? yes If so, what is the name of the agency? [facility]  Has the agency set up a time to come to the patient's home? no Were any new equipment or medical supplies ordered?  No What is the name of the medical supply agency? [facility] Were you able to get the supplies/equipment? not applicable Do you have any questions related to the use of the equipment or supplies? No  Functional Questionnaire: (I = Independent and D = Dependent) ADLs: D  Bathing/Dressing- D Has to have assistance due to Weakness  Meal Prep- D  Eating- I  Maintaining continence- D  Transferring/Ambulation- D due to weakness  Managing Meds- D  Follow up appointments reviewed:  PCP Hospital f/u appt confirmed? Yes  Scheduled to see Janett Billow on 10/10/2021 @ 1:30. Appointment offered sooner but son wanted to wait till next week.  [facility] f/u appt confirmed? No   Are transportation arrangements needed? No  If their condition worsens, is the pt aware to call PCP or go to the Emergency Dept.? Yes Was the patient provided with contact information for the PCP's office or ED? Yes Was to pt encouraged to call back with questions or concerns? Yes

## 2021-10-10 ENCOUNTER — Encounter: Payer: Medicare Other | Admitting: Nurse Practitioner

## 2021-10-12 DIAGNOSIS — I10 Essential (primary) hypertension: Secondary | ICD-10-CM | POA: Diagnosis not present

## 2021-10-12 DIAGNOSIS — Z7984 Long term (current) use of oral hypoglycemic drugs: Secondary | ICD-10-CM | POA: Diagnosis not present

## 2021-10-12 DIAGNOSIS — Z950 Presence of cardiac pacemaker: Secondary | ICD-10-CM | POA: Diagnosis not present

## 2021-10-12 DIAGNOSIS — E119 Type 2 diabetes mellitus without complications: Secondary | ICD-10-CM | POA: Diagnosis not present

## 2021-10-12 DIAGNOSIS — R278 Other lack of coordination: Secondary | ICD-10-CM | POA: Diagnosis not present

## 2021-10-12 DIAGNOSIS — S065X9D Traumatic subdural hemorrhage with loss of consciousness of unspecified duration, subsequent encounter: Secondary | ICD-10-CM | POA: Diagnosis not present

## 2021-10-24 ENCOUNTER — Other Ambulatory Visit: Payer: Self-pay | Admitting: Family

## 2021-10-24 DIAGNOSIS — G245 Blepharospasm: Secondary | ICD-10-CM

## 2021-10-25 ENCOUNTER — Inpatient Hospital Stay (HOSPITAL_COMMUNITY): Payer: Medicare Other

## 2021-10-25 NOTE — Telephone Encounter (Signed)
Patient has request refill on medication "Clonazepam 1mg ". Patient last refill was 07/28/2021. Patient has Non Opioid Contract on file dated 07/12/2020. Patient has upcoming appointment 12/05/2020. Update Contract added to patient appointment notes. Patient medication pend and sent to PCP [name], NP. Please Advise.

## 2021-11-10 ENCOUNTER — Ambulatory Visit (INDEPENDENT_AMBULATORY_CARE_PROVIDER_SITE_OTHER): Payer: Medicare Other | Admitting: Nurse Practitioner

## 2021-11-10 ENCOUNTER — Telehealth: Payer: Self-pay

## 2021-11-10 ENCOUNTER — Encounter: Payer: Self-pay | Admitting: Nurse Practitioner

## 2021-11-10 ENCOUNTER — Other Ambulatory Visit: Payer: Self-pay

## 2021-11-10 DIAGNOSIS — Z0000 Encounter for general adult medical examination without abnormal findings: Secondary | ICD-10-CM | POA: Diagnosis not present

## 2021-11-10 DIAGNOSIS — E2839 Other primary ovarian failure: Secondary | ICD-10-CM

## 2021-11-10 NOTE — Progress Notes (Signed)
This service is provided via telemedicine  No vital signs collected/recorded due to the encounter was a telemedicine visit.   Location of patient (ex: home, work):  Home  Patient consents to a telephone visit:  Yes, see encounter dated 11/10/2021  Location of the provider (ex: office, home):  Home  Name of any referring provider:  N/A  Names of all persons participating in the telemedicine service and their role in the encounter:  Sherrie Mustache, Nurse Practitioner, Carroll Kinds, CMA, and patient.   Time spent on call:  13 minutes with medical assistant

## 2021-11-10 NOTE — Patient Instructions (Signed)
Mary Higgins , Thank you for taking time to come for your Medicare Wellness Visit. I appreciate your ongoing commitment to your health goals. Please review the following plan we discussed and let me know if I can assist you in the future.   Screening recommendations/referrals: Colonoscopy aged out Mammogram aged out Bone Density ORDERED today- [phone] Recommended yearly ophthalmology/optometry visit for glaucoma screening and checkup Recommended yearly dental visit for hygiene and checkup  Vaccinations: Influenza vaccine recommended to get annually Pneumococcal vaccine up to date Tdap vaccine recommended to get- to get at local pharmacy Shingles vaccine up to date    Advanced directives: recommended to compete and so we can place on file.   Conditions/risks identified: advance age, fall risk, hypertension  Next appointment: yearly    Preventive Care 47 Years and Older, Female Preventive care refers to lifestyle choices and visits with your health care provider that can promote health and wellness. What does preventive care include? A yearly physical exam. This is also called an annual well check. Dental exams once or twice a year. Routine eye exams. Ask your health care provider how often you should have your eyes checked. Personal lifestyle choices, including: Daily care of your teeth and gums. Regular physical activity. Eating a healthy diet. Avoiding tobacco and drug use. Limiting alcohol use. Practicing safe sex. Taking low-dose aspirin every day. Taking vitamin and mineral supplements as recommended by your health care provider. What happens during an annual well check? The services and screenings done by your health care provider during your annual well check will depend on your age, overall health, lifestyle risk factors, and family history of disease. Counseling  Your health care provider may ask you questions about your: Alcohol use. Tobacco use. Drug  use. Emotional well-being. Home and relationship well-being. Sexual activity. Eating habits. History of falls. Memory and ability to understand (cognition). Work and work Statistician. Reproductive health. Screening  You may have the following tests or measurements: Height, weight, and BMI. Blood pressure. Lipid and cholesterol levels. These may be checked every 5 years, or more frequently if you are over 88 years old. Skin check. Lung cancer screening. You may have this screening every year starting at age 66 if you have a 30-pack-year history of smoking and currently smoke or have quit within the past 15 years. Fecal occult blood test (FOBT) of the stool. You may have this test every year starting at age 33. Flexible sigmoidoscopy or colonoscopy. You may have a sigmoidoscopy every 5 years or a colonoscopy every 10 years starting at age 49. Hepatitis C blood test. Hepatitis B blood test. Sexually transmitted disease (STD) testing. Diabetes screening. This is done by checking your blood sugar (glucose) after you have not eaten for a while (fasting). You may have this done every 1-3 years. Bone density scan. This is done to screen for osteoporosis. You may have this done starting at age 84. Mammogram. This may be done every 1-2 years. Talk to your health care provider about how often you should have regular mammograms. Talk with your health care provider about your test results, treatment options, and if necessary, the need for more tests. Vaccines  Your health care provider may recommend certain vaccines, such as: Influenza vaccine. This is recommended every year. Tetanus, diphtheria, and acellular pertussis (Tdap, Td) vaccine. You may need a Td booster every 10 years. Zoster vaccine. You may need this after age 40. Pneumococcal 13-valent conjugate (PCV13) vaccine. One dose is recommended after age 52. Pneumococcal  polysaccharide (PPSV23) vaccine. One dose is recommended after age  34. Talk to your health care provider about which screenings and vaccines you need and how often you need them. This information is not intended to replace advice given to you by your health care provider. Make sure you discuss any questions you have with your health care provider. Document Released: 11/26/2015 Document Revised: 07/19/2016 Document Reviewed: 08/31/2015 Elsevier Interactive Patient Education  2017 [place] Prevention in the Home Falls can cause injuries. They can happen to people of all ages. There are many things you can do to make your home safe and to help prevent falls. What can I do on the outside of my home? Regularly fix the edges of walkways and driveways and fix any cracks. Remove anything that might make you trip as you walk through a door, such as a raised step or threshold. Trim any bushes or trees on the path to your home. Use bright outdoor lighting. Clear any walking paths of anything that might make someone trip, such as rocks or tools. Regularly check to see if handrails are loose or broken. Make sure that both sides of any steps have handrails. Any raised decks and porches should have guardrails on the edges. Have any leaves, snow, or ice cleared regularly. Use sand or salt on walking paths during winter. Clean up any spills in your garage right away. This includes oil or grease spills. What can I do in the bathroom? Use night lights. Install grab bars by the toilet and in the tub and shower. Do not use towel bars as grab bars. Use non-skid mats or decals in the tub or shower. If you need to sit down in the shower, use a plastic, non-slip stool. Keep the floor dry. Clean up any water that spills on the floor as soon as it happens. Remove soap buildup in the tub or shower regularly. Attach bath mats securely with double-sided non-slip rug tape. Do not have throw rugs and other things on the floor that can make you trip. What can I do in the  bedroom? Use night lights. Make sure that you have a light by your bed that is easy to reach. Do not use any sheets or blankets that are too big for your bed. They should not hang down onto the floor. Have a firm chair that has side arms. You can use this for support while you get dressed. Do not have throw rugs and other things on the floor that can make you trip. What can I do in the kitchen? Clean up any spills right away. Avoid walking on wet floors. Keep items that you use a lot in easy-to-reach places. If you need to reach something above you, use a strong step stool that has a grab bar. Keep electrical cords out of the way. Do not use floor polish or wax that makes floors slippery. If you must use wax, use non-skid floor wax. Do not have throw rugs and other things on the floor that can make you trip. What can I do with my stairs? Do not leave any items on the stairs. Make sure that there are handrails on both sides of the stairs and use them. Fix handrails that are broken or loose. Make sure that handrails are as long as the stairways. Check any carpeting to make sure that it is firmly attached to the stairs. Fix any carpet that is loose or worn. Avoid having throw rugs at the top  or bottom of the stairs. If you do have throw rugs, attach them to the floor with carpet tape. Make sure that you have a light switch at the top of the stairs and the bottom of the stairs. If you do not have them, ask someone to add them for you. What else can I do to help prevent falls? Wear shoes that: Do not have high heels. Have rubber bottoms. Are comfortable and fit you well. Are closed at the toe. Do not wear sandals. If you use a stepladder: Make sure that it is fully opened. Do not climb a closed stepladder. Make sure that both sides of the stepladder are locked into place. Ask someone to hold it for you, if possible. Clearly mark and make sure that you can see: Any grab bars or  handrails. First and last steps. Where the edge of each step is. Use tools that help you move around (mobility aids) if they are needed. These include: Canes. Walkers. Scooters. Crutches. Turn on the lights when you go into a dark area. Replace any light bulbs as soon as they burn out. Set up your furniture so you have a clear path. Avoid moving your furniture around. If any of your floors are uneven, fix them. If there are any pets around you, be aware of where they are. Review your medicines with your doctor. Some medicines can make you feel dizzy. This can increase your chance of falling. Ask your doctor what other things that you can do to help prevent falls. This information is not intended to replace advice given to you by your health care provider. Make sure you discuss any questions you have with your health care provider. Document Released: 08/26/2009 Document Revised: 04/06/2016 Document Reviewed: 12/04/2014 Elsevier Interactive Patient Education  2017 [employer].

## 2021-11-10 NOTE — Progress Notes (Signed)
Subjective:   Mary Higgins is a 80 y.o. female who presents for Medicare Annual (Subsequent) preventive examination.  Review of Systems     Cardiac Risk Factors include: advanced age (>43men, >50 women);diabetes mellitus;hypertension     Objective:    Today's Vitals   11/10/21 1508  PainSc: 5    There is no height or weight on file to calculate BMI.  Advanced Directives 11/10/2021 07/19/2021 03/07/2021 03/31/2020 02/18/2020 09/24/2019 05/19/2019  Does Patient Have a Medical Advance Directive? No No No No No No No  Does patient want to make changes to medical advance directive? - - - - - - -  Would patient like information on creating a medical advance directive? - - No - Patient declined Yes (MAU/Ambulatory/Procedural Areas - Information given) - Yes (MAU/Ambulatory/Procedural Areas - Information given) No - Patient declined    Current Medications (verified) Outpatient Encounter Medications as of 11/10/2021  Medication Sig   Artificial Tear Ointment (DRY EYES OP) Place 1 drop into both eyes daily as needed (dry eyes).   Blood Glucose Monitoring Suppl (ONE TOUCH ULTRA 2) w/Device KIT USE UP TO FOUR TIMES DAILY AS DIRECTED   buPROPion (WELLBUTRIN SR) 150 MG 12 hr tablet Take 1 tablet (150 mg total) by mouth daily.   clonazePAM (KLONOPIN) 1 MG tablet TAKE 1 TABLET(1 MG) BY MOUTH THREE TIMES DAILY AS NEEDED   losartan (COZAAR) 100 MG tablet Take 1/2 tablet by mouth in the morning and Take 1/2 tablet by mouth in the evening.   metFORMIN (GLUCOPHAGE) 500 MG tablet TAKE 1 TABLET BY MOUTH WITH BREAKFAST AND WITH SUPPER FOR DIABETES   pantoprazole (PROTONIX) 40 MG tablet TAKE 1 TABLET(40 MG) BY MOUTH DAILY   polyethylene glycol (MIRALAX / GLYCOLAX) 17 g packet Take 17 g by mouth daily as needed for mild constipation.   traMADol (ULTRAM) 50 MG tablet Take 50 mg by mouth every 8 (eight) hours as needed for headache.   No facility-administered encounter medications on file as of 11/10/2021.     Allergies (verified) Oxaprozin   History: Past Medical History:  Diagnosis Date   Anxiety and depression    Hx of   Blepharospasm     Breast cancer ([place]) 1987   s/p lumpectomy.   Carotid bruit    Bilateral. Mild to moderate disease on LEFT and mild on the RIGHT in 2012   COPD (chronic obstructive pulmonary disease) (HCC)    mild. Not on inhalers.   Diabetes mellitus without complication ([place])    AODM   Fuchs' corneal dystrophy    GERD (gastroesophageal reflux disease)     Headache    Hyperlipidemia    Hypertension     Insomnia     Intracerebral bleed ([place]) 2003   Remote intracerebral blled and coiling by Dr. Estanislado Pandy for cerebral aneurysm and a shunt placed by Dr. Vertell Limber remotely. No seizure disorder and this happened in 2003 with an anterior communicating aneurysm.   Meningioma ([place])    left parafalcine calcified meningioma   Neurocardiogenic syncope    a. s/p Biotronik dual chamber pacemaker   Pacemaker    Personal history of radiation therapy    S/P VP shunt 2003   Sinus node dysfunction (HCC)     Tobacco abuse     Type II or unspecified type diabetes mellitus without mention of complication, not stated as uncontrolled     Past Surgical History:  Procedure Laterality Date   ANEURYSM COILING  2003   Cerebral aneurysm ;  Dr. Luanne Bras   BREAST LUMPECTOMY Right    For cancer, no recurrence   CHOLECYSTECTOMY     CRANIOTOMY Right 08/04/2021   Procedure: BURR HOLES FOR SUBDURAL HEMATOMA;  Surgeon: Judith Part, MD;  Location: [place];  Service: Neurosurgery;  Laterality: Right;   IR ANGIO INTRA EXTRACRAN SEL INTERNAL CAROTID UNI R MOD SED  09/30/2021   IR ANGIO VERTEBRAL SEL VERTEBRAL UNI R MOD SED  09/30/2021   IR TRANSCATH/EMBOLIZ  09/30/2021   PACEMAKER GENERATOR CHANGE N/A 09/25/2014   BTK dual chamber pacemkaer implanted by Dr Rayann Heman   PACEMAKER INSERTION  07/12/2006   [employer] Jude Victory XL DR  -  Dr. Tami Ribas   PARTIAL HYSTERECTOMY     RADIOLOGY  WITH ANESTHESIA N/A 09/30/2021   Procedure: Arteriogram, Onyx embolization of right middle meningeal artery;  Surgeon: Consuella Lose, MD;  Location: [facility];  Service: Radiology;  Laterality: N/A;   VENTRICULOPERITONEAL SHUNT  2003   Dr. Erline Levine   Family History  Problem Relation Age of Onset   Cancer Mother    Heart attack Father    Heart attack Son        Heart Pump placed    Diabetes Son    Hypertension Other    Social History   Socioeconomic History   Marital status: Legally Separated    Spouse name: Not on file   Number of children: Not on file   Years of education: Not on file   Highest education level: Not on file  Occupational History   Not on file  Tobacco Use   Smoking status: Some Days    Years: 15.00    Types: Cigarettes    Start date: 01/12/2019   Smokeless tobacco: Never   Tobacco comments:    Smokes 1-3 cig daily as of 07/2021. Started as a teenager, smoked 1 ppd, but then quit cold [place] x years. Restarted 2019.  Vaping Use   Vaping Use: Never used  Substance and Sexual Activity   Alcohol use: No    Alcohol/week: 0.0 standard drinks   Drug use: Never   Sexual activity: Not Currently  Other Topics Concern   Not on file  Social History Narrative   Not on file   Social Determinants of Health   Financial Resource Strain: Not on file  Food Insecurity: Not on file  Transportation Needs: Not on file  Physical Activity: Not on file  Stress: Not on file  Social Connections: Not on file    Tobacco Counseling Ready to quit: Not Answered Counseling given: Not Answered Tobacco comments: Smokes 1-3 cig daily as of 07/2021. Started as a teenager, smoked 1 ppd, but then quit cold [place] x years. Restarted 2019.   Clinical Intake:  Pre-visit preparation completed: Yes  Pain : 0-10 Pain Score: 5  Pain Type: Chronic pain Pain Location: Head Pain Radiating Towards: generalized Pain Descriptors / Indicators: Aching Pain Onset: More than a month  ago Pain Frequency: Constant     BMI - recorded: 20 Nutritional Status: BMI of 19-24  Normal Nutritional Risks: Unintentional weight loss Diabetes: No  How often do you need to have someone help you when you read instructions, pamphlets, or other written materials from your doctor or pharmacy?: 1 - Never  Diabetic?yes         Activities of Daily Living In your present state of health, do you have any difficulty performing the following activities: 11/10/2021 09/30/2021  Hearing? [place]? N -  Difficulty concentrating or making decisions? Y -  Walking or climbing stairs? N -  Dressing or bathing? Y -  Comment daughter helps -  Doing errands, shopping? Y N  Preparing Food and eating ? Y -  Comment duaghter helps -  Using the Toilet? N -  In the past six months, have you accidently leaked urine? Y -  Do you have problems with loss of bowel control? N -  Managing your Medications? Y -  Comment daughter helps -  Managing your Finances? N -  Housekeeping or managing your Housekeeping? Y -  Comment daughter helps -  Some recent data might be hidden    Patient Care Team: [name], NP as PCP - General (Geriatric Medicine) Patsey Berthold, NP as Nurse Practitioner (Cardiology) Darleen Crocker, MD as Consulting Physician (Ophthalmology) Thompson Grayer, MD as Consulting Physician (Cardiology) Otelia Sergeant, OD as Referring Physician (Ophthalmology)  Indicate any recent Medical Services you may have received from other than [facility] providers in the past year (date may be approximate).     Assessment:   This is a routine wellness examination for [place].  Hearing/Vision screen Hearing Screening - Comments:: Patient has hearin gproblems, but does not wears hearing aids Vision Screening - Comments:: Patient has no vision problems. Patient wears glasses. Patient had eye exam this year. Patient can't remember eye doctor's name  Dietary issues and exercise activities  discussed: Current Exercise Habits: The patient does not participate in regular exercise at present   Goals Addressed   None    Depression Screen PHQ 2/9 Scores 11/10/2021 09/24/2019 05/19/2019 05/13/2018 07/11/2017 03/14/2016 01/21/2015  PHQ - 2 Score 0 6 0 2 6 6  0  PHQ- 9 Score - 15 - 6 16 16  -    Fall Risk Fall Risk  11/10/2021 07/27/2021 03/07/2021 07/07/2020 02/18/2020  Falls in the past year? 1 1 0 1 1  Number falls in past yr: 1 0 0 1 1  Comment - - - - -  Injury with Fall? 0 1 0 0 0  Comment - - - - -  Risk for fall due to : History of fall(s) History of fall(s) - Impaired balance/gait;History of fall(s) -  Follow up Falls evaluation completed Falls evaluation completed;Education provided;Falls prevention discussed - - -    FALL RISK PREVENTION PERTAINING TO THE HOME:  Any stairs in or around the home? Yes  If so, are there any without handrails? No  Home free of loose throw rugs in walkways, pet beds, electrical cords, etc? Yes  Adequate lighting in your home to reduce risk of falls? Yes   ASSISTIVE DEVICES UTILIZED TO PREVENT FALLS:  Life alert? No  Use of a cane, walker or w/c? Yes  Grab bars in the bathroom? No  Shower chair or bench in shower? Yes  Elevated toilet seat or a handicapped toilet? Yes   TIMED UP AND GO:  Was the test performed? No .    Cognitive Function: MMSE - Mini Mental State Exam 05/13/2018 07/11/2017 03/14/2016  Orientation to time 5 3 4   Orientation to Place 5 4 4   Registration 3 3 3   Attention/ Calculation 5 5 5   Recall 2 2 2   Language- name 2 objects 2 2 2   Language- repeat 1 1 1   Language- follow 3 step command 3 3 3   Language- read & follow direction 1 1 1   Write a sentence 1 1 1   Copy design 1 1 1   Total score  $'29 26 27     'w$ 6CIT Screen 11/10/2021 05/19/2019  What Year? 0 points 0 points  What month? 0 points 0 points  What time? 0 points 0 points  Count back from 20 0 points 0 points  Months in reverse 2 points 0 points  Repeat phrase  6 points 4 points  Total Score 8 4    Immunizations Immunization History  Administered Date(s) Administered   Influenza, High Dose Seasonal PF 07/11/2017, 10/01/2018, 09/16/2019   Influenza,inj,Quad PF,6+ Mos 08/18/2015, 12/13/2016   PFIZER(Purple Top)SARS-COV-2 Vaccination 01/20/2020, 02/20/2020, 11/10/2020, 03/19/2021   Pneumococcal Conjugate-13 08/18/2015   Pneumococcal Polysaccharide-23 07/11/2017   Zoster Recombinat (Shingrix) 08/21/2017, 02/05/2018   Zoster, Live 03/16/2015    TDAP status: Due, Education has been provided regarding the importance of this vaccine. Advised may receive this vaccine at local pharmacy or Health Dept. Aware to provide a copy of the vaccination record if obtained from local pharmacy or Health Dept. Verbalized acceptance and understanding.  Flu Vaccine status: Due, Education has been provided regarding the importance of this vaccine. Advised may receive this vaccine at local pharmacy or Health Dept. Aware to provide a copy of the vaccination record if obtained from local pharmacy or Health Dept. Verbalized acceptance and understanding.  Pneumococcal vaccine status: Up to date  Covid-19 vaccine status: Information provided on how to obtain vaccines.   Qualifies for Shingles Vaccine? Yes   Zostavax completed Yes   Shingrix Completed?: Yes  Screening Tests Health Maintenance  Topic Date Due   TETANUS/TDAP  Never done   COVID-19 Vaccine (5 - Booster for Pfizer series) 05/14/2021   INFLUENZA VACCINE  06/13/2021   FOOT EXAM  12/08/2021   HEMOGLOBIN A1C  02/01/2022   OPHTHALMOLOGY EXAM  09/06/2022   Pneumonia Vaccine 33+ Years old  Completed   DEXA SCAN  Completed   Zoster Vaccines- Shingrix  Completed   HPV VACCINES  Aged Out    Health Maintenance  Health Maintenance Due  Topic Date Due   TETANUS/TDAP  Never done   COVID-19 Vaccine (5 - Booster for Pfizer series) 05/14/2021   INFLUENZA VACCINE  06/13/2021    Colorectal cancer screening: No  longer required.   Mammogram status: No longer required due to age.  Bone Density status: Ordered today. Pt provided with contact info and advised to call to schedule appt.  Lung Cancer Screening: (Low Dose CT Chest recommended if Age 20-80 years, 30 pack-year currently smoking OR have quit w/in 15years.) does not qualify.   Lung Cancer Screening Referral: na  Additional Screening:  Hepatitis C Screening: does not qualify  Vision Screening: Recommended annual ophthalmology exams for early detection of glaucoma and other disorders of the eye. Is the patient up to date with their annual eye exam?  Yes  Who is the provider or what is the name of the office in which the patient attends annual eye exams? unsure If pt is not established with a provider, would they like to be referred to a provider to establish care? No .   Dental Screening: Recommended annual dental exams for proper oral hygiene  Community Resource Referral / Chronic Care Management: CRR required this visit?  No   CCM required this visit?  No      Plan:     I have personally reviewed and noted the following in the patients chart:   Medical and social history Use of alcohol, tobacco or illicit drugs  Current medications and supplements including opioid prescriptions.  Functional ability  and status Nutritional status Physical activity Advanced directives List of other physicians Hospitalizations, surgeries, and ER visits in previous 12 months Vitals Screenings to include cognitive, depression, and falls Referrals and appointments  In addition, I have reviewed and discussed with patient certain preventive protocols, quality metrics, and best practice recommendations. A written personalized care plan for preventive services as well as general preventive health recommendations were provided to patient.     [name], NP   11/10/2021    Virtual Visit via Telephone Note  I connected withNAME@ on  11/10/21 at  2:45 PM EST by telephone and verified that I am speaking with the correct person using two identifiers.  Location: Patient: home Provider: home-remote   I discussed the limitations, risks, security and privacy concerns of performing an evaluation and management service by telephone and the availability of in person appointments. I also discussed with the patient that there may be a patient responsible charge related to this service. The patient expressed understanding and agreed to proceed.   I discussed the assessment and treatment plan with the patient. The patient was provided an opportunity to ask questions and all were answered. The patient agreed with the plan and demonstrated an understanding of the instructions.   The patient was advised to call back or seek an in-person evaluation if the symptoms worsen or if the condition fails to improve as anticipated.  I provided 16 minutes of non-face-to-face time during this encounter.  Mary Higgins. Mary Higgins Avs printed and mailed

## 2021-11-10 NOTE — Telephone Encounter (Signed)
Ms. leveta, wahab are scheduled for a virtual visit with your provider today.    Just as we do with appointments in the office, we must obtain your consent to participate.  Your consent will be active for this visit and any virtual visit you may have with one of our providers in the next 365 days.    If you have a MyChart account, I can also send a copy of this consent to you electronically.  All virtual visits are billed to your insurance company just like a traditional visit in the office.  As this is a virtual visit, video technology does not allow for your provider to perform a traditional examination.  This may limit your provider's ability to fully assess your condition.  If your provider identifies any concerns that need to be evaluated in person or the need to arrange testing such as labs, EKG, etc, we will make arrangements to do so.    Although advances in technology are sophisticated, we cannot ensure that it will always work on either your end or our end.  If the connection with a video visit is poor, we may have to switch to a telephone visit.  With either a video or telephone visit, we are not always able to ensure that we have a secure connection.   I need to obtain your verbal consent now.   Are you willing to proceed with your visit today?   [place] has provided verbal consent on 11/10/2021 for a virtual visit (video or telephone).   Carroll Kinds, [facility] 11/10/2021  2:58 PM

## 2021-11-13 HISTORY — PX: CEREBRAL ANEURYSM REPAIR: SHX164

## 2021-11-17 ENCOUNTER — Other Ambulatory Visit: Payer: Self-pay | Admitting: Nurse Practitioner

## 2021-11-17 ENCOUNTER — Other Ambulatory Visit: Payer: Self-pay | Admitting: Family

## 2021-11-29 ENCOUNTER — Other Ambulatory Visit: Payer: Self-pay | Admitting: Nurse Practitioner

## 2021-11-29 DIAGNOSIS — G245 Blepharospasm: Secondary | ICD-10-CM

## 2021-11-29 NOTE — Telephone Encounter (Signed)
Patient has request refill on medication "Klonopin". Patient medication last refilled 10/25/2021. Patient has Non Opioid Contract on file dated 07/12/2020. Patient has upcoming appointment 12/05/2021. Update Contract already added to patient appointment note. Medication pend and sent to Marlowe Sax, NP due to PCP Dewaine Oats Carlos American, NP being out of office. Please Advise.

## 2021-11-30 LAB — CUP PACEART REMOTE DEVICE CHECK
Date Time Interrogation Session: 20230118110259
Implantable Lead Implant Date: 20070830
Implantable Lead Implant Date: 20070830
Implantable Lead Location: 753859
Implantable Lead Location: 753860
Implantable Pulse Generator Implant Date: 20151113
Pulse Gen Model: 394931
Pulse Gen Serial Number: 68408118

## 2021-12-01 ENCOUNTER — Ambulatory Visit (INDEPENDENT_AMBULATORY_CARE_PROVIDER_SITE_OTHER): Payer: Medicare Other

## 2021-12-01 DIAGNOSIS — I495 Sick sinus syndrome: Secondary | ICD-10-CM

## 2021-12-05 ENCOUNTER — Encounter: Payer: Self-pay | Admitting: Nurse Practitioner

## 2021-12-05 ENCOUNTER — Other Ambulatory Visit: Payer: Self-pay

## 2021-12-05 ENCOUNTER — Ambulatory Visit (INDEPENDENT_AMBULATORY_CARE_PROVIDER_SITE_OTHER): Payer: Medicare Other | Admitting: Nurse Practitioner

## 2021-12-05 VITALS — BP 138/82 | HR 89 | Temp 97.1°F | Ht 71.0 in | Wt 151.0 lb

## 2021-12-05 DIAGNOSIS — R519 Headache, unspecified: Secondary | ICD-10-CM

## 2021-12-05 DIAGNOSIS — R197 Diarrhea, unspecified: Secondary | ICD-10-CM | POA: Diagnosis not present

## 2021-12-05 DIAGNOSIS — F324 Major depressive disorder, single episode, in partial remission: Secondary | ICD-10-CM

## 2021-12-05 DIAGNOSIS — H9113 Presbycusis, bilateral: Secondary | ICD-10-CM

## 2021-12-05 DIAGNOSIS — E1169 Type 2 diabetes mellitus with other specified complication: Secondary | ICD-10-CM

## 2021-12-05 DIAGNOSIS — G245 Blepharospasm: Secondary | ICD-10-CM

## 2021-12-05 DIAGNOSIS — S065XAA Traumatic subdural hemorrhage with loss of consciousness status unknown, initial encounter: Secondary | ICD-10-CM

## 2021-12-05 DIAGNOSIS — I10 Essential (primary) hypertension: Secondary | ICD-10-CM

## 2021-12-05 DIAGNOSIS — Z23 Encounter for immunization: Secondary | ICD-10-CM

## 2021-12-05 MED ORDER — SERTRALINE HCL 25 MG PO TABS: 25.0000 mg | ORAL_TABLET | Freq: Every day | ORAL | 1 refills | Status: DC

## 2021-12-05 NOTE — Progress Notes (Signed)
Careteam: Patient Care Team: [name], NP as PCP - General (Geriatric Medicine) Patsey Berthold, NP as Nurse Practitioner (Cardiology) Darleen Crocker, MD as Consulting Physician (Ophthalmology) Thompson Grayer, MD as Consulting Physician (Cardiology) Otelia Sergeant, OD as Referring Physician (Ophthalmology)  PLACE OF SERVICE:  [place] Directive information Does Patient Have a Medical Advance Directive?: No, Would patient like information on creating a medical advance directive?: No - Patient declined (Will discuss with Son)  Allergies  Allergen Reactions   Oxaprozin Rash    Chief Complaint  Patient presents with   Medical Management of Chronic Issues    Routine follow-up and sign treatment agreement for clonazepam. Discuss need for covid boosters or post pone if patient refuses. Flu vaccine today. Here with daughter Maudie Mercury      HPI: Patient is a 81 y.o. female for routine follow up. Pt with hx of intracerebral hemorrahage, VP shunt, PPM, HTN, DM.   She was hospitalized twice since September. In September noted to have syncope and headache and went to ED was noted to have subdural hematoma and was hospitalized from 9/21/9/28 under neurosurgery requiring bur hole. She was  discharge home but readmitted for monitoring to have coil embolization of cerebral aneurysm. Doing well at home with the help of her daughter. Her son also lives with her. She has completed home health PT  Reports she is having a hard time remembering. She did not remember the sweater her son got her for christmas.   Continues to have spasms of her eyes using clonazepam three times daily.   Reports she is keeping an headache at all times. She has been taking tramadol per neurosurgery for the headache.  She had arteriogram on 09/30/21, had therapist and nursing that were coming out. She has not follow up with neurosurgery after this. Reports they have attempted to call to schedule but has not  gotten in touch with anyone.   Depression- continues on wellbutrin, unsure how to rate her depression. Daughter feels like she is very withdrawn.   No more diarrhea. (Was seen prior to hospitalization due to this)   DM- continues on metformin twice daily, does not check blood sugar routinely but when she checks is normal.   Review of Systems:  Review of Systems  Constitutional:  Negative for chills, fever and weight loss.  HENT:  Negative for tinnitus.   Respiratory:  Negative for cough, sputum production and shortness of breath.   Cardiovascular:  Negative for chest pain, palpitations and leg swelling.  Gastrointestinal:  Negative for abdominal pain, constipation, diarrhea and heartburn.  Genitourinary:  Negative for dysuria, frequency and urgency.  Musculoskeletal:  Negative for back pain, falls, joint pain and myalgias.  Skin: Negative.   Neurological:  Positive for headaches. Negative for dizziness.  Psychiatric/Behavioral:  Positive for depression and memory loss. The patient does not have insomnia.    Past Medical History:  Diagnosis Date   Anxiety and depression    Hx of   Blepharospasm     Breast cancer ([place]) 1987   s/p lumpectomy.   Carotid bruit    Bilateral. Mild to moderate disease on LEFT and mild on the RIGHT in 2012   COPD (chronic obstructive pulmonary disease) (HCC)    mild. Not on inhalers.   Diabetes mellitus without complication ([place])    AODM   Fuchs' corneal dystrophy    GERD (gastroesophageal reflux disease)     Headache    Hyperlipidemia  Hypertension     Insomnia     Intracerebral bleed ([place]) 2003   Remote intracerebral blled and coiling by Dr. Estanislado Pandy for cerebral aneurysm and a shunt placed by Dr. Vertell Limber remotely. No seizure disorder and this happened in 2003 with an anterior communicating aneurysm.   Meningioma ([place])    left parafalcine calcified meningioma   Neurocardiogenic syncope    a. s/p Biotronik dual chamber pacemaker   Pacemaker     Personal history of radiation therapy    S/P VP shunt 2003   Sinus node dysfunction (HCC)     Tobacco abuse     Type II or unspecified type diabetes mellitus without mention of complication, not stated as uncontrolled     Past Surgical History:  Procedure Laterality Date   ANEURYSM COILING  2003   Cerebral aneurysm ;Dr. Luanne Bras   BREAST LUMPECTOMY Right    For cancer, no recurrence   CHOLECYSTECTOMY     CRANIOTOMY Right 08/04/2021   Procedure: BURR HOLES FOR SUBDURAL HEMATOMA;  Surgeon: Judith Part, MD;  Location: [facility];  Service: Neurosurgery;  Laterality: Right;   IR ANGIO INTRA EXTRACRAN SEL INTERNAL CAROTID UNI R MOD SED  09/30/2021   IR ANGIO VERTEBRAL SEL VERTEBRAL UNI R MOD SED  09/30/2021   IR TRANSCATH/EMBOLIZ  09/30/2021   PACEMAKER GENERATOR CHANGE N/A 09/25/2014   BTK dual chamber pacemkaer implanted by Dr Rayann Heman   PACEMAKER INSERTION  07/12/2006   St. Jude Victory XL DR  -  Dr. Tami Ribas   PARTIAL HYSTERECTOMY     RADIOLOGY WITH ANESTHESIA N/A 09/30/2021   Procedure: Arteriogram, Onyx embolization of right middle meningeal artery;  Surgeon: Consuella Lose, MD;  Location: [facility];  Service: Radiology;  Laterality: N/A;   VENTRICULOPERITONEAL SHUNT  2003   Dr. Erline Levine   Social History:   reports that she has been smoking cigarettes. She started smoking about 2 years ago. She has never used smokeless tobacco. She reports that she does not drink alcohol and does not use drugs.  Family History  Problem Relation Age of Onset   Cancer Mother    Heart attack Father    Heart attack Son        Heart Pump placed    Diabetes Son    Hypertension Other     Medications: Patient's Medications  New Prescriptions   No medications on file  Previous Medications   ARTIFICIAL TEAR OINTMENT (DRY EYES OP)    Place 1 drop into both eyes daily as needed (dry eyes).   BLOOD GLUCOSE MONITORING SUPPL (ONE TOUCH ULTRA 2) W/DEVICE KIT    USE UP TO FOUR TIMES DAILY AS  DIRECTED   BUPROPION (WELLBUTRIN SR) 150 MG 12 HR TABLET    Take 1 tablet (150 mg total) by mouth daily.   CLONAZEPAM (KLONOPIN) 1 MG TABLET    TAKE 1 TABLET(1 MG) BY MOUTH THREE TIMES DAILY AS NEEDED   LOSARTAN (COZAAR) 100 MG TABLET    Take 1/2 tablet by mouth in the morning and Take 1/2 tablet by mouth in the evening.   METFORMIN (GLUCOPHAGE) 500 MG TABLET    TAKE 1 TABLET BY MOUTH WITH BREAKFAST AND WITH SUPPER FOR DIABETES   PANTOPRAZOLE (PROTONIX) 40 MG TABLET    TAKE 1 TABLET(40 MG) BY MOUTH DAILY   POLYETHYLENE GLYCOL (MIRALAX / GLYCOLAX) 17 G PACKET    Take 17 g by mouth daily as needed for mild constipation.   TRAMADOL (ULTRAM) 50 MG TABLET  Take 50 mg by mouth every 8 (eight) hours as needed for headache.  Modified Medications   No medications on file  Discontinued Medications   No medications on file    Physical Exam:  Vitals:   12/05/21 1456  BP: 138/82  Pulse: 89  Temp: (!) 97.1 F (36.2 C)  TempSrc: Temporal  SpO2: 99%  Weight: 151 lb (68.5 kg)  Height: $Remove'5\' 11"'azHxWID$  (1.803 m)   Body mass index is 21.06 kg/m. Wt Readings from Last 3 Encounters:  12/05/21 151 lb (68.5 kg)  09/30/21 149 lb 4 oz (67.7 kg)  08/04/21 180 lb 5.4 oz (81.8 kg)    Physical Exam Constitutional:      General: She is not in acute distress.    Appearance: She is well-developed. She is not diaphoretic.  HENT:     Head: Normocephalic and atraumatic.     Mouth/Throat:     Pharynx: No oropharyngeal exudate.  Eyes:     Conjunctiva/sclera: Conjunctivae normal.     Pupils: Pupils are equal, round, and reactive to light.  Cardiovascular:     Rate and Rhythm: Normal rate and regular rhythm.     Heart sounds: Normal heart sounds.  Pulmonary:     Effort: Pulmonary effort is normal.     Breath sounds: Normal breath sounds.  Abdominal:     General: Bowel sounds are normal.     Palpations: Abdomen is soft.  Musculoskeletal:     Cervical back: Normal range of motion and neck supple.     Right  lower leg: No edema.     Left lower leg: No edema.  Skin:    General: Skin is warm and dry.  Neurological:     Mental Status: She is alert and oriented to person, place, and time.     Gait: Gait normal.  Psychiatric:        Mood and Affect: Mood normal.    Labs reviewed: Basic Metabolic Panel: Recent Labs    08/05/21 0529 08/08/21 0557 09/30/21 1318  NA 136 137 136  K 3.8 3.1* 3.9  CL 105 104 102  CO2 21* 26 24  GLUCOSE 166* 109* 99  BUN 13 7* 11  CREATININE 0.65 0.56 0.83  CALCIUM 8.7* 8.5* 8.9   Liver Function Tests: Recent Labs    12/08/20 1225 07/19/21 0625 08/03/21 1821  AST 8* 13* 14*  ALT $Re'6 9 9  'GTv$ ALKPHOS  --  54 61  BILITOT 0.6 1.0 0.7  PROT 6.2 6.8 6.0*  ALBUMIN  --  4.0 3.4*   No results for input(s): LIPASE, AMYLASE in the last 8760 hours. No results for input(s): AMMONIA in the last 8760 hours. CBC: Recent Labs    08/03/21 1821 08/04/21 0521 08/05/21 0529 08/08/21 0557 09/30/21 1318  WBC 12.3*   < > 8.0 6.0 8.6  NEUTROABS 10.3*  --   --  3.1 6.2  HGB 11.7*   < > 11.1* 9.6* 11.1*  HCT 35.3*   < > 33.4* 29.1* 33.8*  MCV 91.7   < > 89.3 90.4 91.6  PLT 293   < > 253 286 263   < > = values in this interval not displayed.   Lipid Panel: Recent Labs    08/03/21 2022 08/05/21 0529  CHOL 125  --   HDL 34*  --   LDLCALC 71  --   TRIG 98 114  CHOLHDL 3.7  --    TSH: No results for input(s): TSH in the  last 8760 hours. A1C: Lab Results  Component Value Date   HGBA1C 6.2 (H) 08/04/2021     Assessment/Plan 1. Need for influenza vaccination - Flu Vaccine QUAD High Dose(Fluad)  2. Hypertension, unspecified type -Blood pressure well controlled Continue current medications Recheck metabolic panel - CMP with eGFR([facility]) - CBC with Differential/Platelet  3. Intractable headache, unspecified chronicity pattern, unspecified headache type -ongoing, encouraged follow up with neurosurgery since they have not since her last procedure.   4.  Diarrhea, unspecified type Resolved at this time  5. Blepharospasm -continues on klonopin to help manage symptoms  6. Major depressive disorder with single episode, in partial remission ([place]) -ongoing, symptoms not controlled, she was previously on zoloft, will restart low dose at this time.  - sertraline (ZOLOFT) 25 MG tablet; Take 1 tablet (25 mg total) by mouth daily.  Dispense: 30 tablet; Refill: 1  7. Type 2 diabetes mellitus with other specified complication, without long-term current use of insulin (HCC) Metformin was reduced and blood sugars well controlled when she checks at home. Encouraged to continue dietary compliance, routine foot care/monitoring and to keep up with diabetic eye exams through ophthalmology  - Hemoglobin A1c  8. Presbycusis of both ears - Ambulatory referral to Audiology  9. Subdural hematoma S/p arteriogram, Has completed home health. She has not follow up with neurosurgery, recommending calling for follow up at this time.    Next appt: 6 weeks on mood.  Carlos American. [place], [facility] [phone]

## 2021-12-06 LAB — COMPLETE METABOLIC PANEL WITH GFR
AG Ratio: 1.8 (calc) (ref 1.0–2.5)
ALT: 6 U/L (ref 6–29)
AST: 9 U/L — ABNORMAL LOW (ref 10–35)
Albumin: 4.2 g/dL (ref 3.6–5.1)
Alkaline phosphatase (APISO): 51 U/L (ref 37–153)
BUN: 14 mg/dL (ref 7–25)
CO2: 29 mmol/L (ref 20–32)
Calcium: 9.5 mg/dL (ref 8.6–10.4)
Chloride: 103 mmol/L (ref 98–110)
Creat: 0.8 mg/dL (ref 0.60–0.95)
Globulin: 2.3 g/dL (calc) (ref 1.9–3.7)
Glucose, Bld: 125 mg/dL — ABNORMAL HIGH (ref 65–99)
Potassium: 4.4 mmol/L (ref 3.5–5.3)
Sodium: 139 mmol/L (ref 135–146)
Total Bilirubin: 0.7 mg/dL (ref 0.2–1.2)
Total Protein: 6.5 g/dL (ref 6.1–8.1)
eGFR: 74 mL/min/{1.73_m2} (ref >=60)

## 2021-12-06 LAB — CBC WITH DIFFERENTIAL/PLATELET
Absolute Monocytes: 368 cells/uL (ref 200–950)
Basophils Absolute: 68 cells/uL (ref 0–200)
Basophils Relative: 0.9 %
Eosinophils Absolute: 90 cells/uL (ref 15–500)
Eosinophils Relative: 1.2 %
HCT: 37.2 % (ref 35.0–45.0)
Hemoglobin: 12.3 g/dL (ref 11.7–15.5)
Lymphs Abs: 1958 cells/uL (ref 850–3900)
MCH: 29.1 pg (ref 27.0–33.0)
MCHC: 33.1 g/dL (ref 32.0–36.0)
MCV: 87.9 fL (ref 80.0–100.0)
MPV: 10.5 fL (ref 7.5–12.5)
Monocytes Relative: 4.9 %
Neutro Abs: 5018 cells/uL (ref 1500–7800)
Neutrophils Relative %: 66.9 %
Platelets: 275 10*3/uL (ref 140–400)
RBC: 4.23 10*6/uL (ref 3.80–5.10)
RDW: 13 % (ref 11.0–15.0)
Total Lymphocyte: 26.1 %
WBC: 7.5 10*3/uL (ref 3.8–10.8)

## 2021-12-06 LAB — HEMOGLOBIN A1C
Hgb A1c MFr Bld: 5.9 % of total Hgb — ABNORMAL HIGH (ref <5.7)
Mean Plasma Glucose: 123 mg/dL
eAG (mmol/L): 6.8 mmol/L

## 2021-12-14 NOTE — Progress Notes (Signed)
Remote pacemaker transmission.   

## 2022-01-03 ENCOUNTER — Other Ambulatory Visit: Payer: Self-pay | Admitting: Nurse Practitioner

## 2022-01-16 ENCOUNTER — Encounter: Payer: Self-pay | Admitting: Nurse Practitioner

## 2022-01-16 ENCOUNTER — Ambulatory Visit: Payer: Medicare Other | Admitting: Nurse Practitioner

## 2022-01-16 VITALS — Ht 71.0 in

## 2022-01-17 NOTE — Progress Notes (Signed)
No show for appt. 

## 2022-01-18 ENCOUNTER — Other Ambulatory Visit: Payer: Self-pay | Admitting: Family

## 2022-01-18 DIAGNOSIS — G245 Blepharospasm: Secondary | ICD-10-CM

## 2022-01-18 MED ORDER — CLONAZEPAM 1 MG PO TABS: ORAL_TABLET | ORAL | 0 refills | Status: DC

## 2022-01-18 NOTE — Telephone Encounter (Signed)
Message was routed to Logan Bores, [place]  ?

## 2022-01-18 NOTE — Telephone Encounter (Signed)
RX last refilled on 11/29/2021 #90/1 refill ? ?Treatment agreement on file from January 2023  ?

## 2022-01-18 NOTE — Addendum Note (Signed)
Addended by: Bing Matter A on: 01/18/2022 02:49 PM ? ? Modules accepted: Orders ? ?

## 2022-01-18 NOTE — Telephone Encounter (Signed)
Pt missed follow up, needs to make sure to schedule follow up before additional refills can be provided ? ?

## 2022-01-18 NOTE — Telephone Encounter (Signed)
Called patient, patient has a voicemail box that has not been sent up. I will have to try to reach patient again later  ?

## 2022-01-18 NOTE — Addendum Note (Signed)
Addended by: Logan Bores on: 01/18/2022 03:47 PM ? ? Modules accepted: Orders ? ?

## 2022-01-19 NOTE — Telephone Encounter (Signed)
Called patient again, no answer, and no voicemail box. I will try again later.  ?

## 2022-01-20 ENCOUNTER — Other Ambulatory Visit: Payer: Self-pay

## 2022-01-20 NOTE — Telephone Encounter (Signed)
3rd unsuccessful attempt made to reach patient, no answer, and no voicemail. ? ?A letter will be mailed to the patient.  ? ?

## 2022-02-09 ENCOUNTER — Other Ambulatory Visit: Payer: Self-pay | Admitting: Nurse Practitioner

## 2022-02-09 DIAGNOSIS — F324 Major depressive disorder, single episode, in partial remission: Secondary | ICD-10-CM

## 2022-02-15 ENCOUNTER — Other Ambulatory Visit: Payer: Self-pay | Admitting: Nurse Practitioner

## 2022-02-20 ENCOUNTER — Ambulatory Visit: Payer: Medicare Other | Admitting: Nurse Practitioner

## 2022-02-24 ENCOUNTER — Telehealth: Payer: Self-pay

## 2022-02-24 ENCOUNTER — Other Ambulatory Visit: Payer: Self-pay | Admitting: Nurse Practitioner

## 2022-02-24 MED ORDER — LOSARTAN POTASSIUM 100 MG PO TABS: ORAL_TABLET | ORAL | 0 refills | Status: DC

## 2022-02-24 NOTE — Telephone Encounter (Signed)
Refill request received from pharmacy °

## 2022-02-28 ENCOUNTER — Other Ambulatory Visit: Payer: Self-pay | Admitting: Nurse Practitioner

## 2022-02-28 DIAGNOSIS — G245 Blepharospasm: Secondary | ICD-10-CM

## 2022-02-28 NOTE — Telephone Encounter (Signed)
Rx last refill on 01/18/22, 90/0 refill. Treatment agreement on file dated 11/24/21. Has upcoming appointment on 03/03/22.  ?

## 2022-03-02 ENCOUNTER — Ambulatory Visit (INDEPENDENT_AMBULATORY_CARE_PROVIDER_SITE_OTHER): Payer: Medicare Other

## 2022-03-02 DIAGNOSIS — I495 Sick sinus syndrome: Secondary | ICD-10-CM

## 2022-03-02 LAB — CUP PACEART REMOTE DEVICE CHECK
Battery Remaining Percentage: 55 %
Brady Statistic RA Percent Paced: 42 %
Brady Statistic RV Percent Paced: 0 %
Date Time Interrogation Session: 20230420072854
Implantable Lead Implant Date: 20070830
Implantable Lead Implant Date: 20070830
Implantable Lead Location: 753859
Implantable Lead Location: 753860
Implantable Pulse Generator Implant Date: 20151113
Lead Channel Impedance Value: 254 Ohm
Lead Channel Impedance Value: 566 Ohm
Lead Channel Sensing Intrinsic Amplitude: 1.6 mV
Lead Channel Sensing Intrinsic Amplitude: 7.2 mV
Lead Channel Setting Pacing Amplitude: 2 V
Lead Channel Setting Pacing Amplitude: 3 V
Lead Channel Setting Pacing Pulse Width: 0.8 ms
Lead Channel Setting Sensing Sensitivity: 2 mV
Pulse Gen Model: 394931
Pulse Gen Serial Number: 68408118

## 2022-03-03 ENCOUNTER — Encounter: Payer: Self-pay | Admitting: Nurse Practitioner

## 2022-03-03 ENCOUNTER — Ambulatory Visit (INDEPENDENT_AMBULATORY_CARE_PROVIDER_SITE_OTHER): Payer: Medicare Other | Admitting: Nurse Practitioner

## 2022-03-03 VITALS — BP 122/78 | HR 89 | Temp 97.8°F | Ht 71.0 in | Wt 144.0 lb

## 2022-03-03 DIAGNOSIS — F324 Major depressive disorder, single episode, in partial remission: Secondary | ICD-10-CM

## 2022-03-03 DIAGNOSIS — I10 Essential (primary) hypertension: Secondary | ICD-10-CM

## 2022-03-03 DIAGNOSIS — R519 Headache, unspecified: Secondary | ICD-10-CM | POA: Diagnosis not present

## 2022-03-03 DIAGNOSIS — E1169 Type 2 diabetes mellitus with other specified complication: Secondary | ICD-10-CM

## 2022-03-03 DIAGNOSIS — R413 Other amnesia: Secondary | ICD-10-CM | POA: Diagnosis not present

## 2022-03-03 DIAGNOSIS — R197 Diarrhea, unspecified: Secondary | ICD-10-CM

## 2022-03-03 DIAGNOSIS — G4700 Insomnia, unspecified: Secondary | ICD-10-CM

## 2022-03-03 MED ORDER — TRAZODONE HCL 50 MG PO TABS: ORAL_TABLET | ORAL | 1 refills | Status: DC

## 2022-03-03 NOTE — Patient Instructions (Addendum)
Compression stockings for feet ? ?Start benefiber powder into liquid daily to bulk stools.  ?No imodium.  ? ?Increase physical and mental activity during the day.  ? ? ?Stop zoloft, start trazodone  ?To stop zoloft- 1/2 tablet for 1 week then stop.  ? ?At bedtime- make sure you are keeping the same routine at night, get in the bed at the same time.  ?Use white noise- sound machine ?-relaxation techniques (my toes are relaxed, my feet are relax, -scan body from toe to head, head to toe) ?-weighted blanket.  ?-get up at the same time every morning.  ? ? ?Limit caffeine- NO caffeine after 2 pm- caffeine free   ?

## 2022-03-03 NOTE — Progress Notes (Signed)
? ? ?Careteam: ?Patient Care Team: ?[name], NP as PCP - General (Geriatric Medicine) ?Patsey Berthold, NP as Nurse Practitioner (Cardiology) ?Darleen Crocker, MD as Consulting Physician (Ophthalmology) ?Thompson Grayer, MD as Consulting Physician (Cardiology) ?Otelia Sergeant, OD as Referring Physician (Ophthalmology) ? ?PLACE OF SERVICE:  ?[facility]  ?Advanced Directive information ?Does Patient Have a Medical Advance Directive?: Yes, Type of Advance Directive: [place], Does patient want to make changes to medical advance directive?: No - Patient declined ? ?Allergies  ?Allergen Reactions  ? Oxaprozin Rash  ? ? ?Chief Complaint  ?Patient presents with  ? Medical Management of Chronic Issues  ?  3 month follow-up on mood. Foot exam today. Discuss need for additional covid boosters. Patient c/o diarrhea (cut metformin back to once daily). Memory concerns, not sleeping well (trouble staying and falling asleep), diarrhea, and daily headaches.   ? ? ? ?HPI: Patient is a 81 y.o. female for 3 months for routine follow up.  ? ?Patient has diarrhea off and on for the past year. Believes metformin is contributing to it. Has decreased metformin to once daily for the past several months. Takes Imodium for it. Admit she does feel constipated the past two days after taking imodium.  ? ?Headache:- States she has been having headache every day for the past year. No aura noted by patient. Had a brain aneurysm in December 2022 and headaches has not stopped since resolved. Has tried tylenol, and Aleve with no relief. Would like something to help.  ? ?Insomnia: Has issues with falling and staying asleep. Has tried melatonin but it is not helping. Drinks 3-5 diet Pepsi throughout the day. ? ?Memory has been getting worse over the past year especially at nights.  ? ?Hypertension - not checks at home. Takes Losartan 100 mg QD.  ? ?Diabetes:- Does not check CBG at home regularly. Usually runs about 100's  when checked.  ? ?Depression:- not sure if Zoloft has been helping, thinks it is getting worse. Very jumpy.  ? ?Review of Systems:  ?Review of Systems  ?Constitutional:  Negative for chills, fever and malaise/fatigue.  ?Respiratory:  Negative for cough, sputum production and shortness of breath.   ?Cardiovascular:  Positive for leg swelling. Negative for chest pain and palpitations.  ?Gastrointestinal:  Positive for diarrhea and nausea. Negative for abdominal pain, constipation, heartburn and vomiting.  ?     Nausea with headaches  ?Genitourinary:  Negative for dysuria, frequency and urgency.  ?Musculoskeletal:  Negative for myalgias.  ?Neurological:  Positive for dizziness, tingling, tremors and headaches.  ?     -Tingling in hands and feet. ? ?  ?Psychiatric/Behavioral:  Positive for depression and memory loss.   ? ?Past Medical History:  ?Diagnosis Date  ? Anxiety and depression   ? Hx of  ? Blepharospasm    ? Breast cancer ([place]) 1987  ? s/p lumpectomy.  ? Carotid bruit   ? Bilateral. Mild to moderate disease on LEFT and mild on the RIGHT in 2012  ? COPD (chronic obstructive pulmonary disease) ([place])   ? mild. Not on inhalers.  ? Diabetes mellitus without complication ([place])   ? AODM  ? Fuchs' corneal dystrophy   ? GERD (gastroesophageal reflux disease)    ? Headache   ? Hyperlipidemia   ? Hypertension    ? Insomnia    ? Intracerebral bleed ([place]) 2003  ? Remote intracerebral blled and coiling by Dr. Estanislado Pandy for cerebral aneurysm and a shunt placed  by Dr. Vertell Limber remotely. No seizure disorder and this happened in 2003 with an anterior communicating aneurysm.  ? Meningioma ([place])   ? left parafalcine calcified meningioma  ? Neurocardiogenic syncope   ? a. s/p Biotronik dual chamber pacemaker  ? Pacemaker   ? Personal history of radiation therapy   ? S/P VP shunt 2003  ? Sinus node dysfunction (HCC)    ? Tobacco abuse    ? Type II or unspecified type diabetes mellitus without mention of complication, not stated as  uncontrolled    ? ?Past Surgical History:  ?Procedure Laterality Date  ? ANEURYSM COILING  11/13/2001  ? Cerebral aneurysm ;Dr. Luanne Bras  ? BREAST LUMPECTOMY Right   ? For cancer, no recurrence  ? CEREBRAL ANEURYSM REPAIR  2023  ? CHOLECYSTECTOMY    ? CRANIOTOMY Right 08/04/2021  ? Procedure: BURR HOLES FOR SUBDURAL HEMATOMA;  Surgeon: Judith Part, MD;  Location: [facility];  Service: Neurosurgery;  Laterality: Right;  ? IR ANGIO INTRA EXTRACRAN SEL INTERNAL CAROTID UNI R MOD SED  09/30/2021  ? IR ANGIO VERTEBRAL SEL VERTEBRAL UNI R MOD SED  09/30/2021  ? IR TRANSCATH/EMBOLIZ  09/30/2021  ? PACEMAKER GENERATOR CHANGE N/A 09/25/2014  ? BTK dual chamber pacemkaer implanted by Dr Rayann Heman  ? PACEMAKER INSERTION  07/12/2006  ? St. Jude Victory XL DR  -  Dr. Tami Ribas  ? PARTIAL HYSTERECTOMY    ? RADIOLOGY WITH ANESTHESIA N/A 09/30/2021  ? Procedure: Arteriogram, Onyx embolization of right middle meningeal artery;  Surgeon: Consuella Lose, MD;  Location: [facility];  Service: Radiology;  Laterality: N/A;  ? VENTRICULOPERITONEAL SHUNT  11/13/2001  ? Dr. Erline Levine  ? ?Social History: ?  reports that she has been smoking cigarettes. She started smoking about 3 years ago. She has never used smokeless tobacco. She reports that she does not drink alcohol and does not use drugs. ? ?Family History  ?Problem Relation Age of Onset  ? Cancer Mother   ? Heart attack Father   ? Heart attack Son   ?     Heart Pump placed   ? Diabetes Son   ? Hypertension Other   ? ? ?Medications: ?Patient's Medications  ?New Prescriptions  ? TRAZODONE (DESYREL) 50 MG TABLET    To start 25 mg by mouth at bedtime for 2 weeks then increase to 50 mg by mouth at bedtime  ?Previous Medications  ? ARTIFICIAL TEAR OINTMENT (DRY EYES OP)    Place 1 drop into both eyes daily as needed (dry eyes).  ? BLOOD GLUCOSE MONITORING SUPPL (ONE TOUCH ULTRA 2) W/DEVICE KIT    USE UP TO FOUR TIMES DAILY AS DIRECTED  ? BUPROPION (WELLBUTRIN SR) 150 MG 12 HR TABLET     Take 1 tablet (150 mg total) by mouth daily.  ? CLONAZEPAM (KLONOPIN) 1 MG TABLET    TAKE 1 TABLET(1 MG) BY MOUTH THREE TIMES DAILY AS NEEDED  ? LOSARTAN (COZAAR) 100 MG TABLET    Take 1/2 tablet by mouth in the morning and Take 1/2 tablet by mouth in the evening.  ? METFORMIN (GLUCOPHAGE) 500 MG TABLET    TAKE 1 TABLET BY MOUTH WITH BREAKFAST AND WITH SUPPER FOR DIABETES  ? PANTOPRAZOLE (PROTONIX) 40 MG TABLET    TAKE 1 TABLET(40 MG) BY MOUTH DAILY  ?Modified Medications  ? No medications on file  ?Discontinued Medications  ? POLYETHYLENE GLYCOL (MIRALAX / GLYCOLAX) 17 G PACKET    Take 17 g by mouth daily  as needed for mild constipation.  ? SERTRALINE (ZOLOFT) 25 MG TABLET    TAKE 1 TABLET(25 MG) BY MOUTH DAILY  ? TRAMADOL (ULTRAM) 50 MG TABLET    Take 50 mg by mouth every 8 (eight) hours as needed for headache.  ? ? ?Physical Exam: ? ?Vitals:  ? 03/03/22 1352  ?BP: 122/78  ?Pulse: 89  ?Temp: 97.8 ?F (36.6 ?C)  ?TempSrc: Temporal  ?SpO2: 99%  ?Weight: 144 lb (65.3 kg)  ?Height: $RemoveB'5\' 11"'IHwQlpWq$  (1.803 m)  ? ?Body mass index is 20.08 kg/m?. ?Wt Readings from Last 3 Encounters:  ?03/03/22 144 lb (65.3 kg)  ?12/05/21 151 lb (68.5 kg)  ?09/30/21 149 lb 4 oz (67.7 kg)  ? ? ?Physical Exam ?Constitutional:   ?   Appearance: Normal appearance. She is normal weight.  ?HENT:  ?   Mouth/Throat:  ?   Mouth: Mucous membranes are moist.  ?   Pharynx: Oropharynx is clear.  ?Cardiovascular:  ?   Rate and Rhythm: Normal rate and regular rhythm.  ?   Pulses: Normal pulses.  ?   Heart sounds: Normal heart sounds.  ?Pulmonary:  ?   Breath sounds: Normal breath sounds.  ?Abdominal:  ?   General: Abdomen is flat.  ?   Palpations: Abdomen is soft.  ?Musculoskeletal:     ?   General: Normal range of motion.  ?Neurological:  ?   Mental Status: She is alert and oriented to person, place, and time.  ?Psychiatric:     ?   Mood and Affect: Mood normal.     ?   Thought Content: Thought content normal.     ?   Judgment: Judgment normal.  ? ?Diabetic  Foot Exam - Simple   ?Simple Foot Form ?Visual Inspection ?No deformities, no ulcerations, no other skin breakdown bilaterally: Yes ?Sensation Testing ?Intact to touch and monofilament testing bilaterally: Alfred Levins

## 2022-03-04 LAB — CBC WITH DIFFERENTIAL/PLATELET
Absolute Monocytes: 498 cells/uL (ref 200–950)
Basophils Absolute: 80 cells/uL (ref 0–200)
Basophils Relative: 0.9 %
Eosinophils Absolute: 80 cells/uL (ref 15–500)
Eosinophils Relative: 0.9 %
HCT: 37.9 % (ref 35.0–45.0)
Hemoglobin: 12.3 g/dL (ref 11.7–15.5)
Lymphs Abs: 2252 cells/uL (ref 850–3900)
MCH: 29.4 pg (ref 27.0–33.0)
MCHC: 32.5 g/dL (ref 32.0–36.0)
MCV: 90.7 fL (ref 80.0–100.0)
MPV: 10.1 fL (ref 7.5–12.5)
Monocytes Relative: 5.6 %
Neutro Abs: 5990 cells/uL (ref 1500–7800)
Neutrophils Relative %: 67.3 %
Platelets: 371 10*3/uL (ref 140–400)
RBC: 4.18 10*6/uL (ref 3.80–5.10)
RDW: 13.3 % (ref 11.0–15.0)
Total Lymphocyte: 25.3 %
WBC: 8.9 10*3/uL (ref 3.8–10.8)

## 2022-03-04 LAB — COMPLETE METABOLIC PANEL WITH GFR
AG Ratio: 2 (calc) (ref 1.0–2.5)
ALT: 5 U/L — ABNORMAL LOW (ref 6–29)
AST: 8 U/L — ABNORMAL LOW (ref 10–35)
Albumin: 3.9 g/dL (ref 3.6–5.1)
Alkaline phosphatase (APISO): 41 U/L (ref 37–153)
BUN: 15 mg/dL (ref 7–25)
CO2: 30 mmol/L (ref 20–32)
Calcium: 9.1 mg/dL (ref 8.6–10.4)
Chloride: 102 mmol/L (ref 98–110)
Creat: 0.74 mg/dL (ref 0.60–0.95)
Globulin: 2 g/dL (calc) (ref 1.9–3.7)
Glucose, Bld: 110 mg/dL — ABNORMAL HIGH (ref 65–99)
Potassium: 3.5 mmol/L (ref 3.5–5.3)
Sodium: 139 mmol/L (ref 135–146)
Total Bilirubin: 0.8 mg/dL (ref 0.2–1.2)
Total Protein: 5.9 g/dL — ABNORMAL LOW (ref 6.1–8.1)
eGFR: 82 mL/min/{1.73_m2} (ref >=60)

## 2022-03-04 LAB — HEMOGLOBIN A1C
Hgb A1c MFr Bld: 5.9 % of total Hgb — ABNORMAL HIGH (ref <5.7)
Mean Plasma Glucose: 123 mg/dL
eAG (mmol/L): 6.8 mmol/L

## 2022-03-04 LAB — TSH: TSH: 1.07 mIU/L (ref 0.40–4.50)

## 2022-03-04 LAB — VITAMIN B12: Vitamin B-12: 201 pg/mL (ref 200–1100)

## 2022-03-17 NOTE — Progress Notes (Signed)
Remote pacemaker transmission.   

## 2022-03-20 ENCOUNTER — Other Ambulatory Visit: Payer: Self-pay | Admitting: Nurse Practitioner

## 2022-03-20 NOTE — Telephone Encounter (Signed)
Patient is request refill on medication Losartan. Patient takes 1/2 tablet in the morning and evening. Patient has no refills. This medication has been discontinued many times in the past. Im unsure if this medication is suppose to be refilled. Medication pend and sent to PCP Dewaine Oats Carlos American, NP  ?

## 2022-03-31 ENCOUNTER — Other Ambulatory Visit (HOSPITAL_COMMUNITY): Payer: Self-pay | Admitting: Neurological Surgery

## 2022-03-31 ENCOUNTER — Other Ambulatory Visit (HOSPITAL_BASED_OUTPATIENT_CLINIC_OR_DEPARTMENT_OTHER): Payer: Self-pay | Admitting: Neurological Surgery

## 2022-03-31 DIAGNOSIS — S065XAA Traumatic subdural hemorrhage with loss of consciousness status unknown, initial encounter: Secondary | ICD-10-CM

## 2022-04-03 ENCOUNTER — Other Ambulatory Visit: Payer: Self-pay | Admitting: Nurse Practitioner

## 2022-04-03 DIAGNOSIS — G245 Blepharospasm: Secondary | ICD-10-CM

## 2022-04-03 NOTE — Telephone Encounter (Signed)
Rx  last refilled on 03/01/22, number 90  Treatment agreement on file from January 2023

## 2022-05-05 ENCOUNTER — Ambulatory Visit (HOSPITAL_BASED_OUTPATIENT_CLINIC_OR_DEPARTMENT_OTHER): Admission: RE | Admit: 2022-05-05 | Payer: Medicare Other | Source: Ambulatory Visit

## 2022-05-08 ENCOUNTER — Other Ambulatory Visit: Payer: Self-pay | Admitting: Nurse Practitioner

## 2022-05-08 DIAGNOSIS — G245 Blepharospasm: Secondary | ICD-10-CM

## 2022-06-01 ENCOUNTER — Ambulatory Visit (INDEPENDENT_AMBULATORY_CARE_PROVIDER_SITE_OTHER): Payer: Medicare Other

## 2022-06-01 DIAGNOSIS — I495 Sick sinus syndrome: Secondary | ICD-10-CM

## 2022-06-01 LAB — CUP PACEART REMOTE DEVICE CHECK
Battery Remaining Percentage: 50 %
Brady Statistic RA Percent Paced: 40 %
Brady Statistic RV Percent Paced: 0 %
Date Time Interrogation Session: 20230719075314
Implantable Lead Implant Date: 20070830
Implantable Lead Implant Date: 20070830
Implantable Lead Location: 753859
Implantable Lead Location: 753860
Implantable Pulse Generator Implant Date: 20151113
Lead Channel Impedance Value: 273 Ohm
Lead Channel Impedance Value: 488 Ohm
Lead Channel Pacing Threshold Amplitude: 0.5 V
Lead Channel Pacing Threshold Pulse Width: 0.4 ms
Lead Channel Sensing Intrinsic Amplitude: 2.1 mV
Lead Channel Sensing Intrinsic Amplitude: 6.5 mV
Lead Channel Setting Pacing Amplitude: 1.5 V
Lead Channel Setting Pacing Amplitude: 3.6 V
Lead Channel Setting Pacing Pulse Width: 0.4 ms
Pulse Gen Model: 394931
Pulse Gen Serial Number: 68408118

## 2022-06-02 ENCOUNTER — Encounter: Payer: Self-pay | Admitting: Nurse Practitioner

## 2022-06-02 ENCOUNTER — Encounter: Payer: Medicare Other | Admitting: Nurse Practitioner

## 2022-06-02 NOTE — Progress Notes (Unsigned)
Careteam: Patient Care Team: Sharon Seller, NP as PCP - General (Geriatric Medicine) Marily Lente, NP as Nurse Practitioner (Cardiology) Mia Creek, MD as Consulting Physician (Ophthalmology) Hillis Range, MD as Consulting Physician (Cardiology) Jill Side, OD as Referring Physician (Ophthalmology)  PLACE OF SERVICE:  [facility]  Advanced Directive information    Allergies  Allergen Reactions   Oxaprozin Rash    Chief Complaint  Patient presents with   Medical Management of Chronic Issues    3 month follow-up. Foot exam today. Discuss need for additional covid boosters or post pone if patient refuses. NCIR verified. Patient denies receiving any vaccines since last visit.      HPI: Patient is a 81 y.o. female ***  Review of Systems:  ROS***  Past Medical History:  Diagnosis Date   Anxiety and depression    Hx of   Blepharospasm     Breast cancer (HCC) 1987   s/p lumpectomy.   Carotid bruit    Bilateral. Mild to moderate disease on LEFT and mild on the RIGHT in 2012   COPD (chronic obstructive pulmonary disease) (HCC)    mild. Not on inhalers.   Diabetes mellitus without complication (HCC)    AODM   Fuchs' corneal dystrophy    GERD (gastroesophageal reflux disease)     Headache    Hyperlipidemia    Hypertension     Insomnia     Intracerebral bleed (HCC) 2003   Remote intracerebral blled and coiling by Dr. Corliss Skains for cerebral aneurysm and a shunt placed by Dr. Venetia Maxon remotely. No seizure disorder and this happened in 2003 with an anterior communicating aneurysm.   Meningioma (HCC)    left parafalcine calcified meningioma   Neurocardiogenic syncope    a. s/p Biotronik dual chamber pacemaker   Pacemaker    Personal history of radiation therapy    S/P VP shunt 2003   Sinus node dysfunction (HCC)     Tobacco abuse     Type II or unspecified type diabetes mellitus without mention of complication, not stated as uncontrolled     Past  Surgical History:  Procedure Laterality Date   ANEURYSM COILING  11/13/2001   Cerebral aneurysm ;Dr. Julieanne Cotton   BREAST LUMPECTOMY Right    For cancer, no recurrence   CEREBRAL ANEURYSM REPAIR  2023   CHOLECYSTECTOMY     CRANIOTOMY Right 08/04/2021   Procedure: BURR HOLES FOR SUBDURAL HEMATOMA;  Surgeon: Jadene Pierini, MD;  Location: [facility] OR;  Service: Neurosurgery;  Laterality: Right;   IR ANGIO INTRA EXTRACRAN SEL INTERNAL CAROTID UNI R MOD SED  09/30/2021   IR ANGIO VERTEBRAL SEL VERTEBRAL UNI R MOD SED  09/30/2021   IR TRANSCATH/EMBOLIZ  09/30/2021   PACEMAKER GENERATOR CHANGE N/A 09/25/2014   BTK dual chamber pacemkaer implanted by Dr Johney Frame   PACEMAKER INSERTION  07/12/2006   St. Jude Victory XL DR  -  Dr. Jenne Campus   PARTIAL HYSTERECTOMY     RADIOLOGY WITH ANESTHESIA N/A 09/30/2021   Procedure: Arteriogram, Onyx embolization of right middle meningeal artery;  Surgeon: Lisbeth Renshaw, MD;  Location: [facility] OR;  Service: Radiology;  Laterality: N/A;   VENTRICULOPERITONEAL SHUNT  11/13/2001   Dr. Maeola Harman   Social History:   reports that she has been smoking cigarettes. She started smoking about 3 years ago. She has never used smokeless tobacco. She reports that she does not drink alcohol and does not use drugs.  Family History  Problem Relation  Age of Onset   Cancer Mother    Heart attack Father    Heart attack Son        Heart Pump placed    Diabetes Son    Hypertension Other     Medications: Patient's Medications  New Prescriptions   No medications on file  Previous Medications   ARTIFICIAL TEAR OINTMENT (DRY EYES OP)    Place 1 drop into both eyes daily as needed (dry eyes).   BLOOD GLUCOSE MONITORING SUPPL (ONE TOUCH ULTRA 2) W/DEVICE KIT    USE UP TO FOUR TIMES DAILY AS DIRECTED   BUPROPION (WELLBUTRIN SR) 150 MG 12 HR TABLET    Take 1 tablet (150 mg total) by mouth daily.   CLONAZEPAM (KLONOPIN) 1 MG TABLET    TAKE 1 TABLET(1 MG) BY MOUTH THREE  TIMES DAILY AS NEEDED   LOSARTAN (COZAAR) 100 MG TABLET    TAKE 1/2 TABLET BY MOUTH IN THE MORNING AND IN THE EVENING   METFORMIN (GLUCOPHAGE) 500 MG TABLET    TAKE 1 TABLET BY MOUTH WITH BREAKFAST AND WITH SUPPER FOR DIABETES   PANTOPRAZOLE (PROTONIX) 40 MG TABLET    TAKE 1 TABLET(40 MG) BY MOUTH DAILY   TRAZODONE (DESYREL) 50 MG TABLET    To start 25 mg by mouth at bedtime for 2 weeks then increase to 50 mg by mouth at bedtime  Modified Medications   No medications on file  Discontinued Medications   No medications on file    Physical Exam:  There were no vitals filed for this visit. There is no height or weight on file to calculate BMI. Wt Readings from Last 3 Encounters:  03/03/22 144 lb (65.3 kg)  12/05/21 151 lb (68.5 kg)  09/30/21 149 lb 4 oz (67.7 kg)    Physical Exam***  Labs reviewed: Basic Metabolic Panel: Recent Labs    09/30/21 1318 12/05/21 1518 03/03/22 1455  NA 136 139 139  K 3.9 4.4 3.5  CL 102 103 102  CO2 $Re'24 29 30  'iEi$ GLUCOSE 99 125* 110*  BUN $Re'11 14 15  'TPr$ CREATININE 0.83 0.80 0.74  CALCIUM 8.9 9.5 9.1  TSH  --   --  1.07   Liver Function Tests: Recent Labs    07/19/21 0625 08/03/21 1821 12/05/21 1518 03/03/22 1455  AST 13* 14* 9* 8*  ALT $Re'9 9 6 'Pzv$ 5*  ALKPHOS 54 61  --   --   BILITOT 1.0 0.7 0.7 0.8  PROT 6.8 6.0* 6.5 5.9*  ALBUMIN 4.0 3.4*  --   --    No results for input(s): "LIPASE", "AMYLASE" in the last 8760 hours. No results for input(s): "AMMONIA" in the last 8760 hours. CBC: Recent Labs    09/30/21 1318 12/05/21 1518 03/03/22 1455  WBC 8.6 7.5 8.9  NEUTROABS 6.2 5,018 5,990  HGB 11.1* 12.3 12.3  HCT 33.8* 37.2 37.9  MCV 91.6 87.9 90.7  PLT 263 275 371   Lipid Panel: Recent Labs    08/03/21 2022 08/05/21 0529  CHOL 125  --   HDL 34*  --   LDLCALC 71  --   TRIG 98 114  CHOLHDL 3.7  --    TSH: Recent Labs    03/03/22 1455  TSH 1.07   A1C: Lab Results  Component Value Date   HGBA1C 5.9 (H) 03/03/2022      Assessment/Plan There are no diagnoses linked to this encounter.  No follow-ups on file.: *** Dimetri Armitage K. [place], [place]  [facility] [phone]

## 2022-06-06 NOTE — Progress Notes (Signed)
Pt was no show

## 2022-06-12 ENCOUNTER — Encounter: Payer: Self-pay | Admitting: Internal Medicine

## 2022-06-14 ENCOUNTER — Telehealth: Payer: Medicare Other

## 2022-06-14 NOTE — Telephone Encounter (Signed)
Incoming fax received from [employer] inquiring about metformin.   Patient request metformin ER 500 mg instead of Metformin 500 mg twice daily due to excessive diarrhea. If ok to change patient needs a new rx sent to pharmacy  Please advise

## 2022-06-15 NOTE — Telephone Encounter (Signed)
Call placed to patient, no answer, and no voicemail.   I will try to call patient again later

## 2022-06-15 NOTE — Telephone Encounter (Signed)
Lets have her back down to just 500 mg of metformin daily and make a follow up appt in office.

## 2022-06-16 NOTE — Telephone Encounter (Signed)
Call placed to patient, no answer, and no voicemail. I will try again next week

## 2022-06-19 NOTE — Progress Notes (Signed)
Remote pacemaker transmission.   

## 2022-06-20 NOTE — Telephone Encounter (Signed)
Call placed to patient, no answer and no voicemail pick-up. I will make a final attempt to reach patient tomorrow and then a letter will be mailed to relay the details of this encounter.

## 2022-06-28 NOTE — Telephone Encounter (Signed)
Letter was sent requesting patient to return call.  Medication will not be changed until we have a verbal conversation with the patient about this.

## 2022-07-03 ENCOUNTER — Telehealth: Payer: Self-pay

## 2022-07-03 NOTE — Telephone Encounter (Signed)
Patient left voicemail on clinical intake voicemail and states that she is having trouble with legs and arms having drawing feeling in arms and legs. She feels like "something is missing"    Tried calling patient to see about scheduling an appointment. There was no answer and a message was left.

## 2022-07-28 ENCOUNTER — Encounter: Payer: Self-pay | Admitting: Internal Medicine

## 2022-08-02 ENCOUNTER — Ambulatory Visit (INDEPENDENT_AMBULATORY_CARE_PROVIDER_SITE_OTHER): Payer: Medicare Other | Admitting: Family

## 2022-08-02 ENCOUNTER — Encounter: Payer: Self-pay | Admitting: Family

## 2022-08-02 VITALS — BP 122/78 | HR 97 | Temp 96.8°F | Ht 70.0 in | Wt 134.4 lb

## 2022-08-02 DIAGNOSIS — R634 Abnormal weight loss: Secondary | ICD-10-CM

## 2022-08-02 DIAGNOSIS — R519 Headache, unspecified: Secondary | ICD-10-CM

## 2022-08-02 DIAGNOSIS — I10 Essential (primary) hypertension: Secondary | ICD-10-CM

## 2022-08-02 DIAGNOSIS — E1169 Type 2 diabetes mellitus with other specified complication: Secondary | ICD-10-CM | POA: Diagnosis not present

## 2022-08-02 DIAGNOSIS — G245 Blepharospasm: Secondary | ICD-10-CM

## 2022-08-02 MED ORDER — CLONAZEPAM 1 MG PO TABS: ORAL_TABLET | ORAL | 2 refills | Status: DC

## 2022-08-02 NOTE — Progress Notes (Signed)
 Provider:   FNP-C  Mary Higgins, Mary Higgins, Mary Higgins  Patient Care Team: Mary Higgins, Mary Higgins, Mary Higgins as PCP - General (Geriatric Medicine) Seiler, Amber Higgins, Mary Higgins (Inactive) as Nurse Practitioner (Cardiology) Bevis, Timothy, MD as Consulting Physician (Ophthalmology) Allred, James, MD as Consulting Physician (Cardiology) Barts, Sarah H, OD as Referring Physician (Ophthalmology)  Extended Emergency Contact Information Primary Emergency Contact: Stanley,Joseph Address: [address]          [place] [address] [place] Home Phone: [phone] Relation: Son Secondary Emergency Contact: Caldwell,Kim  [place] Home Phone: [phone] Mobile Phone: [phone] Relation: Daughter  Code Status:  Full Code  Goals of care: Advanced Directive information    03/03/2022    1:58 PM  Advanced Directives  Does Patient Have a Medical Advance Directive? Yes  Type of Advance Directive Healthcare Power of Attorney  Does patient want to make changes to medical advance directive? No - Patient declined  Copy of Healthcare Power of Attorney in Chart? Yes - validated most recent copy scanned in chart (See row information)     Chief Complaint  Patient presents with   Follow-up   Medical Management of Chronic Issues    HPI:  Pt is a 81 y.o. female seen today for medical management of chronic issues. She is here with her daughter who provides additional medical history. States had diarrhea and nausea in the past visit until 3 weeks ago but took over the counter which has helped.Had  no blood or abdominal pain.Diarrhea has resolved.  Concerned of weight loss.Has had 10 lbs weight loss over 5 months.states appetite has slightly improved.Eats three meals.though daughter states mostly eats a good breakfast but not dinner.Not drinking any protein supplements   Has frequent headaches but does not drinking enough water.   Request refills for Klonopin states ordered by eye  doctor for Blepharospasm but seems to be anxious whenever she runs out of the klonopin.   Previous Hgb A 1 C was 5.9   Had a fall episode 3 weeks ago sustained skin abrasion on left forearm.states fell between her commode and bath tub due to small space.    Past Medical History:  Diagnosis Date   Anxiety and depression    Hx of   Blepharospasm     Breast cancer (HCC) 1987   s/p lumpectomy.   Carotid bruit    Bilateral. Mild to moderate disease on LEFT and mild on the RIGHT in 2012   COPD (chronic obstructive pulmonary disease) (HCC)    mild. Not on inhalers.   Diabetes mellitus without complication (HCC)    AODM   Fuchs' corneal dystrophy    GERD (gastroesophageal reflux disease)     Headache    Hyperlipidemia    Hypertension     Insomnia     Intracerebral bleed (HCC) 2003   Remote intracerebral blled and coiling by Dr. Deveshwar for cerebral aneurysm and a shunt placed by Dr. Stern remotely. No seizure disorder and this happened in 2003 with an anterior communicating aneurysm.   Meningioma (HCC)    left parafalcine calcified meningioma   Neurocardiogenic syncope    a. s/p Biotronik dual chamber pacemaker   Pacemaker    Personal history of radiation therapy    S/P VP shunt 2003   Sinus node dysfunction (HCC)     Tobacco abuse     Type II or unspecified type diabetes mellitus without mention of complication, not stated as uncontrolled     Past Surgical   History:  Procedure Laterality Date   ANEURYSM COILING  11/13/2001   Cerebral aneurysm ;Dr. Luanne Bras   BREAST LUMPECTOMY Right    For cancer, no recurrence   CEREBRAL ANEURYSM REPAIR  2023   CHOLECYSTECTOMY     CRANIOTOMY Right 08/04/2021   Procedure: BURR HOLES FOR SUBDURAL HEMATOMA;  Surgeon: Judith Part, MD;  Location: [facility];  Service: Neurosurgery;  Laterality: Right;   IR ANGIO INTRA EXTRACRAN SEL INTERNAL CAROTID UNI R MOD SED  09/30/2021   IR ANGIO VERTEBRAL SEL VERTEBRAL UNI R MOD SED   09/30/2021   IR TRANSCATH/EMBOLIZ  09/30/2021   PACEMAKER GENERATOR CHANGE N/A 09/25/2014   BTK dual chamber pacemkaer implanted by Dr Rayann Heman   PACEMAKER INSERTION  07/12/2006   St. Jude Victory XL DR  -  Dr. Tami Ribas   PARTIAL HYSTERECTOMY     RADIOLOGY WITH ANESTHESIA N/A 09/30/2021   Procedure: Arteriogram, Onyx embolization of right middle meningeal artery;  Surgeon: Consuella Lose, MD;  Location: [facility];  Service: Radiology;  Laterality: N/A;   VENTRICULOPERITONEAL SHUNT  11/13/2001   Dr. Erline Levine    Allergies  Allergen Reactions   Oxaprozin Rash    Outpatient Encounter Medications as of 08/02/2022  Medication Sig   Artificial Tear Ointment (DRY EYES OP) Place 1 drop into both eyes daily as needed (dry eyes).   Blood Glucose Monitoring Suppl (ONE TOUCH ULTRA 2) w/Device KIT USE UP TO FOUR TIMES DAILY AS DIRECTED   buPROPion (WELLBUTRIN SR) 150 MG 12 hr tablet Take 1 tablet (150 mg total) by mouth daily.   clonazePAM (KLONOPIN) 1 MG tablet TAKE 1 TABLET(1 MG) BY MOUTH THREE TIMES DAILY AS NEEDED   losartan (COZAAR) 100 MG tablet TAKE 1/2 TABLET BY MOUTH IN THE MORNING AND IN THE EVENING   metFORMIN (GLUCOPHAGE) 500 MG tablet TAKE 1 TABLET BY MOUTH WITH BREAKFAST AND WITH SUPPER FOR DIABETES   pantoprazole (PROTONIX) 40 MG tablet TAKE 1 TABLET(40 MG) BY MOUTH DAILY   traZODone (DESYREL) 50 MG tablet To start 25 mg by mouth at bedtime for 2 weeks then increase to 50 mg by mouth at bedtime   [DISCONTINUED] clonazePAM (KLONOPIN) 1 MG tablet TAKE 1 TABLET(1 MG) BY MOUTH THREE TIMES DAILY AS NEEDED   No facility-administered encounter medications on file as of 08/02/2022.    Review of Systems  Constitutional:  Negative for appetite change, chills, fatigue, fever and unexpected weight change.  HENT:  Negative for congestion, dental problem, ear discharge, ear pain, facial swelling, hearing loss, nosebleeds, postnasal drip, rhinorrhea, sinus pressure, sinus pain, sneezing, sore  throat, tinnitus and trouble swallowing.   Eyes:  Negative for pain, discharge, redness, itching and visual disturbance.  Respiratory:  Negative for cough, chest tightness, shortness of breath and wheezing.   Cardiovascular:  Negative for chest pain, palpitations and leg swelling.  Gastrointestinal:  Negative for abdominal distention, abdominal pain, blood in stool, constipation, diarrhea, nausea and vomiting.  Endocrine: Negative for cold intolerance, heat intolerance, polydipsia, polyphagia and polyuria.  Genitourinary:  Negative for difficulty urinating, dysuria, flank pain, frequency and urgency.  Musculoskeletal:  Positive for gait problem. Negative for arthralgias, back pain, joint swelling, myalgias, neck pain and neck stiffness.  Skin:  Negative for color change, pallor, rash and wound.  Neurological:  Negative for dizziness, syncope, speech difficulty, weakness, light-headedness, numbness and headaches.  Hematological:  Does not bruise/bleed easily.  Psychiatric/Behavioral:  Negative for agitation, behavioral problems, confusion, hallucinations, self-injury, sleep disturbance and suicidal ideas. The  patient is not nervous/anxious.     Immunization History  Administered Date(s) Administered   Fluad Quad(high Dose 65+) 12/05/2021   Influenza, High Dose Seasonal PF 07/11/2017, 10/01/2018, 09/16/2019   Influenza,inj,Quad PF,6+ Mos 08/18/2015, 12/13/2016   PFIZER(Purple Top)SARS-COV-2 Vaccination 01/20/2020, 02/20/2020, 11/10/2020, 03/19/2021   Pneumococcal Conjugate-13 08/18/2015   Pneumococcal Polysaccharide-23 07/11/2017   Tdap 03/19/2021   Zoster Recombinat (Shingrix) 08/21/2017, 02/05/2018   Zoster, Live 03/16/2015   Pertinent  Health Maintenance Due  Topic Date Due   FOOT EXAM  12/08/2021   INFLUENZA VACCINE  06/13/2022   HEMOGLOBIN A1C  09/02/2022   OPHTHALMOLOGY EXAM  09/06/2022   DEXA SCAN  Completed      09/30/2021    9:00 PM 10/01/2021    8:00 AM 11/10/2021     2:49 PM 12/05/2021    2:54 PM 03/03/2022    1:57 PM  Fall Risk  Falls in the past year?   _0 Was there an injury with Fall?   0 1 0  Fall Risk Category Calculator   _1 Fall Risk Category   Moderate High Moderate  Patient Fall Risk Level High fall risk High fall risk Moderate fall risk High fall risk Moderate fall risk  Patient at Risk for Falls Due to   History of fall(s) History of fall(s);Impaired balance/gait History of fall(s)  Fall risk Follow up   Falls evaluation completed Falls evaluation completed Falls evaluation completed   Functional Status Survey:    Vitals:   08/02/22 0934  BP: 122/78  Pulse: 97  Temp: (!) 96.8 F (36 C)  TempSrc: Temporal  SpO2: 99%  Weight: 134 lb 6.4 oz (61 kg)  Height: 5' 10" (1.778 m)   Body mass index is 19.28 kg/m. Physical Exam Vitals reviewed.  Constitutional:      General: She is not in acute distress.    Appearance: Normal appearance. She is normal weight. She is not ill-appearing or diaphoretic.  HENT:     Head: Normocephalic.     Right Ear: Tympanic membrane, ear canal and external ear normal. There is no impacted cerumen.     Left Ear: Tympanic membrane, ear canal and external ear normal. There is no impacted cerumen.     Nose: Nose normal. No congestion or rhinorrhea.     Mouth/Throat:     Mouth: Mucous membranes are moist.     Pharynx: Oropharynx is clear. No oropharyngeal exudate or posterior oropharyngeal erythema.  Eyes:     General: No scleral icterus.       Right eye: No discharge.        Left eye: No discharge.     Extraocular Movements: Extraocular movements intact.     Conjunctiva/sclera: Conjunctivae normal.     Pupils: Pupils are equal, round, and reactive to light.  Neck:     Vascular: No carotid bruit.  Cardiovascular:     Rate and Rhythm: Normal rate and regular rhythm.     Pulses: Normal pulses.     Heart sounds: Normal heart sounds. No murmur heard.    No friction rub. No gallop.  Pulmonary:      Effort: Pulmonary effort is normal. No respiratory distress.     Breath sounds: Normal breath sounds. No wheezing, rhonchi or rales.  Chest:     Chest wall: No tenderness.  Abdominal:     General: Bowel sounds are normal. There is no distension.     Palpations: Abdomen is soft. There is  no mass.     Tenderness: There is no abdominal tenderness. There is no right CVA tenderness, left CVA tenderness, guarding or rebound.  Musculoskeletal:        General: No swelling or tenderness. Normal range of motion.     Cervical back: Normal range of motion. No rigidity or tenderness.     Right lower leg: No edema.     Left lower leg: No edema.  Lymphadenopathy:     Cervical: No cervical adenopathy.  Skin:    General: Skin is warm and dry.     Coloration: Skin is not pale.     Findings: No erythema, lesion or rash.     Comments: Progressive healing skin abrasion on left forearm   Neurological:     Mental Status: She is alert and oriented to person, place, and time.     Cranial Nerves: No cranial nerve deficit.     Sensory: No sensory deficit.     Motor: No weakness.     Coordination: Coordination normal.     Gait: Gait normal.  Psychiatric:        Mood and Affect: Mood normal.        Speech: Speech normal.        Behavior: Behavior normal.        Thought Content: Thought content normal.        Judgment: Judgment normal.     Labs reviewed: Recent Labs    09/30/21 1318 12/05/21 1518 03/03/22 1455  NA 136 139 139  Higgins 3.9 4.4 3.5  CL 102 103 102  CO2 _0 GLUCOSE 99 125* 110*  BUN _1 CREATININE 0.83 0.80 0.74  CALCIUM 8.9 9.5 9.1   Recent Labs    08/03/21 1821 12/05/21 1518 03/03/22 1455  AST 14* 9* 8*  ALT 9 6 5*  ALKPHOS 61  --   --   BILITOT 0.7 0.7 0.8  PROT 6.0* 6.5 5.9*  ALBUMIN 3.4*  --   --    Recent Labs    09/30/21 1318 12/05/21 1518 03/03/22 1455  WBC 8.6 7.5 8.9  NEUTROABS 6.2 5,018 5,990  HGB 11.1* 12.3 12.3  HCT 33.8* 37.2 37.9  MCV 91.6  87.9 90.7  PLT 263 275 371   Lab Results  Component Value Date   TSH 1.07 03/03/2022   Lab Results  Component Value Date   HGBA1C 5.9 (H) 03/03/2022   Lab Results  Component Value Date   CHOL 125 08/03/2021   HDL 34 (L) 08/03/2021   LDLCALC 71 08/03/2021   TRIG 114 08/05/2021   CHOLHDL 3.7 08/03/2021    Significant Diagnostic Results in last 30 days:  No results found.  Assessment/Plan 1. Blepharospasm Continue to follow up with Ophthalmology  - continue on clonazepam  - clonazePAM (KLONOPIN) 1 MG tablet; TAKE 1 TABLET(1 MG) BY MOUTH THREE TIMES DAILY AS NEEDED  Dispense: 90 tablet; Refill: 2  2. Weight loss, abnormal Suspect due to skipping meals eating two meals per day. - will rule out hypothyroidism  - TSH  3. Type 2 diabetes mellitus with other specified complication, without long-term current use of insulin (HCC) Lab Results  Component Value Date   HGBA1C 5.9 (H) 03/03/2022  - continue on Metformin  - CBC with Differential/Platelet - CMP with eGFR([facility]) - TSH - Hemoglobin A1c  4. Hypertension, unspecified type B/p well controlled  -Continue on losartan - CBC with Differential/Platelet - CMP with eGFR([facility])  5. Intractable  headache, unspecified chronicity pattern, unspecified headache type Continue on over-the-counter analgesic  Family/ staff Communication: Reviewed plan of care with patient verbalized understanding  Labs/tests ordered:  - CBC with Differential/Platelet - CMP with eGFR([facility]) - TSH - Hgb A1C - Lipid panel  Next Appointment: Return in about 6 months (around 01/31/2023) for medical mangement of chronic issues..    C , Mary Higgins 

## 2022-08-03 LAB — CBC WITH DIFFERENTIAL/PLATELET
Absolute Monocytes: 421 cells/uL (ref 200–950)
Basophils Absolute: 49 cells/uL (ref 0–200)
Basophils Relative: 0.6 %
Eosinophils Absolute: 49 cells/uL (ref 15–500)
Eosinophils Relative: 0.6 %
HCT: 38.4 % (ref 35.0–45.0)
Hemoglobin: 12.9 g/dL (ref 11.7–15.5)
Lymphs Abs: 1426 cells/uL (ref 850–3900)
MCH: 30.7 pg (ref 27.0–33.0)
MCHC: 33.6 g/dL (ref 32.0–36.0)
MCV: 91.4 fL (ref 80.0–100.0)
MPV: 10.1 fL (ref 7.5–12.5)
Monocytes Relative: 5.2 %
Neutro Abs: 6156 cells/uL (ref 1500–7800)
Neutrophils Relative %: 76 %
Platelets: 425 10*3/uL — ABNORMAL HIGH (ref 140–400)
RBC: 4.2 10*6/uL (ref 3.80–5.10)
RDW: 12.6 % (ref 11.0–15.0)
Total Lymphocyte: 17.6 %
WBC: 8.1 10*3/uL (ref 3.8–10.8)

## 2022-08-03 LAB — COMPLETE METABOLIC PANEL WITH GFR
AG Ratio: 2.3 (calc) (ref 1.0–2.5)
ALT: 5 U/L — ABNORMAL LOW (ref 6–29)
AST: 8 U/L — ABNORMAL LOW (ref 10–35)
Albumin: 4.5 g/dL (ref 3.6–5.1)
Alkaline phosphatase (APISO): 45 U/L (ref 37–153)
BUN: 12 mg/dL (ref 7–25)
CO2: 28 mmol/L (ref 20–32)
Calcium: 9.8 mg/dL (ref 8.6–10.4)
Chloride: 101 mmol/L (ref 98–110)
Creat: 0.91 mg/dL (ref 0.60–0.95)
Globulin: 2 g/dL (calc) (ref 1.9–3.7)
Glucose, Bld: 135 mg/dL (ref 65–139)
Potassium: 4.6 mmol/L (ref 3.5–5.3)
Sodium: 139 mmol/L (ref 135–146)
Total Bilirubin: 0.5 mg/dL (ref 0.2–1.2)
Total Protein: 6.5 g/dL (ref 6.1–8.1)
eGFR: 63 mL/min/{1.73_m2} (ref >=60)

## 2022-08-03 LAB — TSH: TSH: 1.3 mIU/L (ref 0.40–4.50)

## 2022-08-03 LAB — HEMOGLOBIN A1C
Hgb A1c MFr Bld: 5.9 % of total Hgb — ABNORMAL HIGH (ref <5.7)
Mean Plasma Glucose: 123 mg/dL
eAG (mmol/L): 6.8 mmol/L

## 2022-08-22 ENCOUNTER — Other Ambulatory Visit: Payer: Self-pay | Admitting: Nurse Practitioner

## 2022-08-31 ENCOUNTER — Ambulatory Visit (INDEPENDENT_AMBULATORY_CARE_PROVIDER_SITE_OTHER): Payer: Medicare Other

## 2022-08-31 DIAGNOSIS — Z950 Presence of cardiac pacemaker: Secondary | ICD-10-CM

## 2022-08-31 DIAGNOSIS — I495 Sick sinus syndrome: Secondary | ICD-10-CM

## 2022-08-31 LAB — CUP PACEART REMOTE DEVICE CHECK
Date Time Interrogation Session: 20231013104437
Implantable Lead Implant Date: 20070830
Implantable Lead Implant Date: 20070830
Implantable Lead Location: 753859
Implantable Lead Location: 753860
Implantable Pulse Generator Implant Date: 20151113
Lead Channel Setting Pacing Amplitude: 1.5 V
Lead Channel Setting Pacing Amplitude: 3.6 V
Lead Channel Setting Pacing Pulse Width: 0.4 ms
Pulse Gen Model: 394931
Pulse Gen Serial Number: 68408118

## 2022-09-08 NOTE — Progress Notes (Signed)
Remote pacemaker transmission.   

## 2022-09-12 ENCOUNTER — Other Ambulatory Visit: Payer: Self-pay

## 2022-09-12 ENCOUNTER — Emergency Department (HOSPITAL_COMMUNITY)
Admission: EM | Admit: 2022-09-12 | Discharge: 2022-09-12 | Disposition: A | Discharge status: Home / Self Care | Payer: Medicare Other | Attending: Emergency Medicine | Admitting: Emergency Medicine

## 2022-09-12 ENCOUNTER — Emergency Department (HOSPITAL_COMMUNITY): Payer: Medicare Other

## 2022-09-12 DIAGNOSIS — R479 Unspecified speech disturbances: Secondary | ICD-10-CM | POA: Diagnosis not present

## 2022-09-12 DIAGNOSIS — Z79899 Other long term (current) drug therapy: Secondary | ICD-10-CM | POA: Diagnosis not present

## 2022-09-12 DIAGNOSIS — F1310 Sedative, hypnotic or anxiolytic abuse, uncomplicated: Secondary | ICD-10-CM | POA: Insufficient documentation

## 2022-09-12 DIAGNOSIS — R519 Headache, unspecified: Secondary | ICD-10-CM | POA: Insufficient documentation

## 2022-09-12 DIAGNOSIS — I10 Essential (primary) hypertension: Secondary | ICD-10-CM | POA: Insufficient documentation

## 2022-09-12 DIAGNOSIS — D649 Anemia, unspecified: Secondary | ICD-10-CM | POA: Diagnosis not present

## 2022-09-12 DIAGNOSIS — R4781 Slurred speech: Secondary | ICD-10-CM | POA: Diagnosis not present

## 2022-09-12 DIAGNOSIS — R4701 Aphasia: Secondary | ICD-10-CM | POA: Diagnosis present

## 2022-09-12 LAB — I-STAT CHEM 8, ED
BUN: 10 mg/dL (ref 8–23)
Calcium, Ion: 1.23 mmol/L (ref 1.15–1.40)
Chloride: 100 mmol/L (ref 98–111)
Creatinine, Ser: 0.6 mg/dL (ref 0.44–1.00)
Glucose, Bld: 106 mg/dL — ABNORMAL HIGH (ref 70–99)
HCT: 36 % (ref 36.0–46.0)
Hemoglobin: 12.2 g/dL (ref 12.0–15.0)
Potassium: 4.1 mmol/L (ref 3.5–5.1)
Sodium: 137 mmol/L (ref 135–145)
TCO2: 25 mmol/L (ref 22–32)

## 2022-09-12 LAB — URINALYSIS, ROUTINE W REFLEX MICROSCOPIC
Bilirubin Urine: NEGATIVE
Glucose, UA: NEGATIVE mg/dL
Ketones, ur: NEGATIVE mg/dL
Leukocytes,Ua: NEGATIVE
Nitrite: NEGATIVE
Protein, ur: NEGATIVE mg/dL
Specific Gravity, Urine: 1.01 (ref 1.005–1.030)
pH: 7 (ref 5.0–8.0)

## 2022-09-12 LAB — DIFFERENTIAL
Abs Immature Granulocytes: 0.01 10*3/uL (ref 0.00–0.07)
Basophils Absolute: 0.1 10*3/uL (ref 0.0–0.1)
Basophils Relative: 1 %
Eosinophils Absolute: 0.1 10*3/uL (ref 0.0–0.5)
Eosinophils Relative: 2 %
Immature Granulocytes: 0 %
Lymphocytes Relative: 42 %
Lymphs Abs: 1.8 10*3/uL (ref 0.7–4.0)
Monocytes Absolute: 0.4 10*3/uL (ref 0.1–1.0)
Monocytes Relative: 8 %
Neutro Abs: 2 10*3/uL (ref 1.7–7.7)
Neutrophils Relative %: 47 %

## 2022-09-12 LAB — COMPREHENSIVE METABOLIC PANEL
ALT: 8 U/L (ref 0–44)
AST: 10 U/L — ABNORMAL LOW (ref 15–41)
Albumin: 3.5 g/dL (ref 3.5–5.0)
Alkaline Phosphatase: 29 U/L — ABNORMAL LOW (ref 38–126)
Anion gap: 8 (ref 5–15)
BUN: 9 mg/dL (ref 8–23)
CO2: 27 mmol/L (ref 22–32)
Calcium: 9.4 mg/dL (ref 8.9–10.3)
Chloride: 102 mmol/L (ref 98–111)
Creatinine, Ser: 0.64 mg/dL (ref 0.44–1.00)
GFR, Estimated: >60 mL/min (ref >60)
Glucose, Bld: 111 mg/dL — ABNORMAL HIGH (ref 70–99)
Potassium: 4.2 mmol/L (ref 3.5–5.1)
Sodium: 137 mmol/L (ref 135–145)
Total Bilirubin: 0.7 mg/dL (ref 0.3–1.2)
Total Protein: 5.6 g/dL — ABNORMAL LOW (ref 6.5–8.1)

## 2022-09-12 LAB — RAPID URINE DRUG SCREEN, HOSP PERFORMED
Amphetamines: NOT DETECTED
Barbiturates: NOT DETECTED
Benzodiazepines: POSITIVE — AB
Cocaine: NOT DETECTED
Opiates: NOT DETECTED
Tetrahydrocannabinol: NOT DETECTED

## 2022-09-12 LAB — APTT: aPTT: 26 seconds (ref 24–36)

## 2022-09-12 LAB — CBC
HCT: 36.4 % (ref 36.0–46.0)
Hemoglobin: 11.9 g/dL — ABNORMAL LOW (ref 12.0–15.0)
MCH: 30.6 pg (ref 26.0–34.0)
MCHC: 32.7 g/dL (ref 30.0–36.0)
MCV: 93.6 fL (ref 80.0–100.0)
Platelets: 204 10*3/uL (ref 150–400)
RBC: 3.89 MIL/uL (ref 3.87–5.11)
RDW: 13 % (ref 11.5–15.5)
WBC: 4.3 10*3/uL (ref 4.0–10.5)
nRBC: 0 % (ref 0.0–0.2)

## 2022-09-12 LAB — PROTIME-INR
INR: 1 (ref 0.8–1.2)
Prothrombin Time: 13.3 seconds (ref 11.4–15.2)

## 2022-09-12 MED ORDER — IOHEXOL 350 MG/ML SOLN
75.0000 mL | Freq: Once | INTRAVENOUS | Status: AC | PRN
  Administered 2022-09-12: 75 mL via INTRAVENOUS

## 2022-09-12 NOTE — ED Notes (Signed)
Pt at imaging.

## 2022-09-12 NOTE — ED Provider Notes (Addendum)
  Physical Exam  Ht '5\' 11"'$  (1.803 m)   LMP  (LMP Unknown)   BMI 18.74 kg/m     Procedures  Procedures  ED Course / MDM    Medical Decision Making Amount and/or Complexity of Data Reviewed Labs: ordered. Radiology: ordered.  Risk Prescription drug management.   33F with hx of SAH, SDH, cerebral aneurysm s/p coiling, has VP shunt, presenting with 10 minutes of aphasia, extensive intracranial history. Has a right sided headache 5/10.  No fevers, shunt series pending.    Work-up significant for urinalysis without evidence of UTI, UDS positive for benzodiazepines otherwise unremarkable, CBC without a leukocytosis, stable anemia to 11.9, CMP without significant electrolyte abnormality, normal renal function, INR normal.  CTA angio head with without contrast was performed and revealed the following:   IMPRESSION:  1. 6 mm thick bilateral cerebral convexity hypodense subdural  collections consistent with chronic subdural hematomas,  significantly decreased in size on the right and not significantly  changed on the left since 08/26/2021. No hyperdense material within  the collections to suggest superimposed acute bleed.  2. Left frontal ventricular catheter in place without evidence of  hydrocephalus.  3. Previous coil embolization of an anterior communicating artery  without definite filling of the treated aneurysm. No new aneurysm.  4. Patent intracranial vasculature with no significant stenosis or  occlusion.    The patient is currently back to her baseline, denies any headache.  No fevers or chills, no shunt pain, no hydrocephalus seen on CTA, no acute bleeding noted.  Low concern for shunt malfunction or infection at this time.  No new subdural hematomas, chronic subdurals present.  She is alert and oriented x3.  She describes a transient episode of confusion and slurred speech which occurred earlier today.  Her son states that she was white as a ghost when this happened and  she was found to have low blood pressure with EMS was subsequently resolved.  Symptoms could be consistent with vasovagal near syncope.  I discussed the patient with on-call neurology, Dr. Malen Gauze who did not feel that MRI imaging was warranted at this time.  Low concern for acute CVA.  No clear seizure-like activity noted.  Patient is back to her baseline mental status.  I recommended that the patient overall follow-up with neurology outpatient.  Stable for discharge.   Regan Lemming, MD 09/12/22 Darlin Drop    Regan Lemming, MD 09/12/22 Einar Crow

## 2022-09-12 NOTE — Discharge Instructions (Addendum)
There is no evidence for shunt malfunction or infection. Low concern for stroke, if concern for possible seizures exists, please follow-up with an outpatient neurologist.  Return to the emergency department for any recurrence of symptoms.

## 2022-09-12 NOTE — ED Provider Notes (Signed)
[facility] EMERGENCY DEPARTMENT Provider Note   CSN: [id number] Arrival date & time: 09/12/22  1420     History  Chief Complaint  Patient presents with   Aphasia    Mary Higgins is a 81 y.o. female.  The history is provided by the patient and medical records. No language interpreter was used.  Neurologic Problem This is a new problem. Episode onset: unclear. The problem occurs rarely. The problem has been resolved. Associated symptoms include headaches. Pertinent negatives include no chest pain, no abdominal pain and no shortness of breath. Nothing aggravates the symptoms. Nothing relieves the symptoms. She has tried nothing for the symptoms. The treatment provided no relief.       Home Medications Prior to Admission medications   Medication Sig Start Date End Date Taking? Authorizing Provider  Artificial Tear Ointment (DRY EYES OP) Place 1 drop into both eyes daily as needed (dry eyes).    [provider]  Blood Glucose Monitoring Suppl (ONE TOUCH ULTRA 2) w/Device KIT USE UP TO FOUR TIMES DAILY AS DIRECTED 02/16/21   [name], NP  buPROPion (WELLBUTRIN SR) 150 MG 12 hr tablet Take 1 tablet (150 mg total) by mouth daily. 07/28/21   Ngetich, Dinah C, NP  clonazePAM (KLONOPIN) 1 MG tablet TAKE 1 TABLET(1 MG) BY MOUTH THREE TIMES DAILY AS NEEDED 08/02/22   Ngetich, Dinah C, NP  losartan (COZAAR) 100 MG tablet TAKE 1/2 TABLET BY MOUTH IN THE MORNING AND IN THE EVENING 03/20/22   [name], NP  metFORMIN (GLUCOPHAGE) 500 MG tablet TAKE 1 TABLET BY MOUTH WITH BREAKFAST AND WITH SUPPER FOR DIABETES 11/17/21   [name], NP  pantoprazole (PROTONIX) 40 MG tablet TAKE 1 TABLET(40 MG) BY MOUTH DAILY 08/22/22   [name], NP  traZODone (DESYREL) 50 MG tablet To start 25 mg by mouth at bedtime for 2 weeks then increase to 50 mg by mouth at bedtime 03/03/22   [name], NP      Allergies    Oxaprozin    Review of  Systems   Review of Systems  Constitutional:  Negative for chills, fatigue and fever.  HENT:  Negative for congestion.   Eyes:  Negative for photophobia, pain and visual disturbance.  Respiratory:  Negative for cough, chest tightness, shortness of breath and wheezing.   Cardiovascular:  Negative for chest pain and palpitations.  Gastrointestinal:  Negative for abdominal pain, constipation, diarrhea, nausea and vomiting.  Genitourinary:  Negative for dysuria and flank pain.  Musculoskeletal:  Negative for back pain, neck pain and neck stiffness.  Skin:  Negative for rash and wound.  Neurological:  Positive for speech difficulty (resolved now) and headaches. Negative for weakness, light-headedness and numbness.  Psychiatric/Behavioral:  Negative for agitation and confusion.   All other systems reviewed and are negative.   Physical Exam Updated Vital Signs Ht _0  (1.803 m)   LMP  (LMP Unknown)   BMI 18.74 kg/m  Physical Exam Vitals and nursing note reviewed.  Constitutional:      General: She is not in acute distress.    Appearance: She is well-developed. She is not ill-appearing, toxic-appearing or diaphoretic.  HENT:     Head: Normocephalic and atraumatic.     Nose: Nose normal.     Mouth/Throat:     Mouth: Mucous membranes are moist.  Eyes:     Extraocular Movements: Extraocular movements intact.     Conjunctiva/sclera: Conjunctivae normal.  Pupils: Pupils are equal, round, and reactive to light.  Cardiovascular:     Rate and Rhythm: Normal rate and regular rhythm.     Heart sounds: No murmur heard. Pulmonary:     Effort: Pulmonary effort is normal. No respiratory distress.     Breath sounds: Normal breath sounds. No wheezing, rhonchi or rales.  Chest:     Chest wall: No tenderness.  Abdominal:     Palpations: Abdomen is soft.     Tenderness: There is no abdominal tenderness. There is no right CVA tenderness, left CVA tenderness, guarding or rebound.   Musculoskeletal:        General: No swelling or tenderness.     Cervical back: Neck supple.  Skin:    General: Skin is warm and dry.     Capillary Refill: Capillary refill takes less than 2 seconds.     Findings: No erythema or rash.  Neurological:     General: No focal deficit present.     Mental Status: She is alert.     Sensory: No sensory deficit.     Motor: No weakness.  Psychiatric:        Mood and Affect: Mood normal.     ED Results / Procedures / Treatments   Labs (all labs ordered are listed, but only abnormal results are displayed) Labs Reviewed  ETHANOL  PROTIME-INR  APTT  CBC  DIFFERENTIAL  COMPREHENSIVE METABOLIC PANEL  RAPID URINE DRUG SCREEN, HOSP PERFORMED  URINALYSIS, ROUTINE W REFLEX MICROSCOPIC  I-STAT CHEM 8, ED    EKG EKG Interpretation  Date/Time:  Tuesday September 12 2022 14:56:15 EDT Ventricular Rate:  64 PR Interval:  179 QRS Duration: 96 QT Interval:  432 QTC Calculation: 446 R Axis:   20 Text Interpretation: Sinus rhythm Anteroseptal infarct, old when compared to prior, slower rate. No STEMI Confirmed by Antony Blackbird [phone]) on 09/12/2022 3:12:09 PM  Radiology No results found.  Procedures Procedures    Medications Ordered in ED Medications - No data to display  ED Course/ Medical Decision Making/ A&P                           Medical Decision Making   Thersa Salt A Sneeringer is a 81 y.o. female with a past medical history significant for hypertension, GERD, previous subarachnoid hemorrhage, subdural hematoma, cerebral aneurysm status post IR management, VP shunt placement, and pacemaker who presents for transient neurologic deficit.  According to patient, she does not member what happened but reportedly had 10 minutes of aphasia today.  She was last normal at 9:30 AM but is unclear what time the episode actually happened.  She reports that her baseline now.  She does report a 5 out of 10 headache on the right side of her head that  is new but denies any trauma.  She denies fevers, chills, congestion, cough, nausea, vomiting, constipation, diarrhea, or urinary changes.  She denies any extremity symptoms and denies any diplopia, dizziness, or other neurologic complaints at this time.  EMS reported her glucose was 121 and not hypoglycemic.  On my initial exam, patient reports is at her baseline.  Her lungs were clear and chest was nontender.  Abdomen was nontender.  She had intact sensation and strength throughout.  Normal finger-nose-finger testing.  Pupils are symmetric and reactive known extract movements.  Speech was clear.  Symmetric smile.  Patient otherwise has no neurologic abnormalities on initial exam.    As she  is at her mental status and neurologic baseline, do not feel she should be a code stroke however I am still concerned about TIA.  Given her history of intracranial hemorrhages, her elevated blood pressure 170 in transport, and her new headache, we will get imaging including a CTA of the head.  Anticipate she may need further work-up after initial work-up is completed.  Care will be transferred to oncoming team while awaiting work-up results.         Final Clinical Impression(s) / ED Diagnoses Final diagnoses:  Transient speech disturbance  Right-sided headache     Clinical Impression: 1. Transient speech disturbance   2. Right-sided headache     Disposition: Care transferred to oncoming team to await work-up and imaging.  This note was prepared with assistance of Systems analyst. Occasional wrong-word or sound-a-like substitutions may have occurred due to the inherent limitations of voice recognition software.      Fischer Halley, Gwenyth Allegra, MD 09/12/22 [phone]

## 2022-09-12 NOTE — ED Triage Notes (Signed)
Stroke like symptoms slurred speech for 10 minutes witnessed with family. Stroke assesment by ems was 0 with no weakness a&o x4 with ems, lkw 930 am hypotensive. Ems vitals  Bp- 170/86 Rr-18 Spo2- 98 Hr- 71 Cbg- 121

## 2022-09-20 ENCOUNTER — Encounter (HOSPITAL_COMMUNITY): Payer: Self-pay

## 2022-09-20 ENCOUNTER — Observation Stay (HOSPITAL_COMMUNITY)
Admission: EM | Admit: 2022-09-20 | Discharge: 2022-09-21 | Disposition: A | Discharge status: Home / Self Care | Payer: Medicare Other | Attending: Family Medicine | Admitting: Family Medicine

## 2022-09-20 ENCOUNTER — Other Ambulatory Visit: Payer: Self-pay

## 2022-09-20 ENCOUNTER — Emergency Department (HOSPITAL_COMMUNITY): Payer: Medicare Other

## 2022-09-20 DIAGNOSIS — R197 Diarrhea, unspecified: Secondary | ICD-10-CM | POA: Diagnosis not present

## 2022-09-20 DIAGNOSIS — Z950 Presence of cardiac pacemaker: Secondary | ICD-10-CM | POA: Insufficient documentation

## 2022-09-20 DIAGNOSIS — E1165 Type 2 diabetes mellitus with hyperglycemia: Secondary | ICD-10-CM | POA: Insufficient documentation

## 2022-09-20 DIAGNOSIS — Z853 Personal history of malignant neoplasm of breast: Secondary | ICD-10-CM | POA: Insufficient documentation

## 2022-09-20 DIAGNOSIS — Z79899 Other long term (current) drug therapy: Secondary | ICD-10-CM | POA: Insufficient documentation

## 2022-09-20 DIAGNOSIS — G4489 Other headache syndrome: Secondary | ICD-10-CM | POA: Diagnosis not present

## 2022-09-20 DIAGNOSIS — R569 Unspecified convulsions: Secondary | ICD-10-CM | POA: Insufficient documentation

## 2022-09-20 DIAGNOSIS — F17210 Nicotine dependence, cigarettes, uncomplicated: Secondary | ICD-10-CM | POA: Insufficient documentation

## 2022-09-20 DIAGNOSIS — I10 Essential (primary) hypertension: Secondary | ICD-10-CM | POA: Diagnosis not present

## 2022-09-20 DIAGNOSIS — E785 Hyperlipidemia, unspecified: Secondary | ICD-10-CM | POA: Insufficient documentation

## 2022-09-20 DIAGNOSIS — Z9889 Other specified postprocedural states: Secondary | ICD-10-CM

## 2022-09-20 DIAGNOSIS — J449 Chronic obstructive pulmonary disease, unspecified: Secondary | ICD-10-CM | POA: Diagnosis not present

## 2022-09-20 DIAGNOSIS — R55 Syncope and collapse: Secondary | ICD-10-CM | POA: Diagnosis not present

## 2022-09-20 DIAGNOSIS — R410 Disorientation, unspecified: Secondary | ICD-10-CM | POA: Diagnosis not present

## 2022-09-20 DIAGNOSIS — Z7984 Long term (current) use of oral hypoglycemic drugs: Secondary | ICD-10-CM | POA: Insufficient documentation

## 2022-09-20 DIAGNOSIS — G245 Blepharospasm: Secondary | ICD-10-CM

## 2022-09-20 LAB — CBC
HCT: 36.1 % (ref 36.0–46.0)
Hemoglobin: 12.2 g/dL (ref 12.0–15.0)
MCH: 31 pg (ref 26.0–34.0)
MCHC: 33.8 g/dL (ref 30.0–36.0)
MCV: 91.9 fL (ref 80.0–100.0)
Platelets: 224 10*3/uL (ref 150–400)
RBC: 3.93 MIL/uL (ref 3.87–5.11)
RDW: 13.2 % (ref 11.5–15.5)
WBC: 8.2 10*3/uL (ref 4.0–10.5)
nRBC: 0 % (ref 0.0–0.2)

## 2022-09-20 LAB — COMPREHENSIVE METABOLIC PANEL
ALT: 6 U/L (ref 0–44)
AST: 10 U/L — ABNORMAL LOW (ref 15–41)
Albumin: 3.6 g/dL (ref 3.5–5.0)
Alkaline Phosphatase: 32 U/L — ABNORMAL LOW (ref 38–126)
Anion gap: 6 (ref 5–15)
BUN: 9 mg/dL (ref 8–23)
CO2: 25 mmol/L (ref 22–32)
Calcium: 9 mg/dL (ref 8.9–10.3)
Chloride: 106 mmol/L (ref 98–111)
Creatinine, Ser: 0.73 mg/dL (ref 0.44–1.00)
GFR, Estimated: >60 mL/min (ref >60)
Glucose, Bld: 158 mg/dL — ABNORMAL HIGH (ref 70–99)
Potassium: 4 mmol/L (ref 3.5–5.1)
Sodium: 137 mmol/L (ref 135–145)
Total Bilirubin: 1.1 mg/dL (ref 0.3–1.2)
Total Protein: 5.8 g/dL — ABNORMAL LOW (ref 6.5–8.1)

## 2022-09-20 LAB — CBG MONITORING, ED: Glucose-Capillary: 173 mg/dL — ABNORMAL HIGH (ref 70–99)

## 2022-09-20 MED ORDER — SODIUM CHLORIDE 0.9 % IV BOLUS
1000.0000 mL | Freq: Once | INTRAVENOUS | Status: AC
  Administered 2022-09-20: 1000 mL via INTRAVENOUS

## 2022-09-20 MED ORDER — ACETAMINOPHEN 500 MG PO TABS
1000.0000 mg | ORAL_TABLET | Freq: Once | ORAL | Status: AC
  Administered 2022-09-21: 1000 mg via ORAL
  Filled 2022-09-20: qty 2

## 2022-09-20 NOTE — ED Provider Notes (Incomplete)
[place] EMERGENCY DEPARTMENT Provider Note   CSN: [id number] Arrival date & time: 09/20/22  2014     History {Add pertinent medical, surgical, social history, OB history to HPI:1} No chief complaint on file.   Mary Higgins is a 81 y.o. female.  The history is provided by the patient and medical records. No language interpreter was used.     81 year old female significant history of neurocardiogenic syncope, diabetes, has pacemaker, prior intracerebral bleed brought here via EMS from home for evaluation of unresponsiveness.  I was able to obtain history from patient.  Patient report she felt tired today.  She was in the labor room when she had an apparent syncopal episode that she did not recall.  She denies any precipitating symptoms prior to her passing out.  She denies any injury or falling from the events.  They report the patient was having right arm shaking and she went unresponsive lasting for several minutes.  She also has some diarrhea.  Patient states she has had recurrent diarrhea for the past 3 to 4 months.  States she would have 2-3 bouts per day which did improve for several weeks but resumed again today.  She has not been evaluated for this.  She denies any changes to medication and no recent antibiotic use.  She does not endorse any significant abdominal pain.  She does endorse some mild head discomfort but no nausea no fever runny nose sneezing coughing chest pain shortness of breath dysuria focal numbness or focal weakness.  She denies tongue biting or bowel bladder incontinence.  She does have a pacemaker.  Home Medications Prior to Admission medications   Medication Sig Start Date End Date Taking? Authorizing Provider  Artificial Tear Ointment (DRY EYES OP) Place 1 drop into both eyes daily as needed (dry eyes).    [provider]  Blood Glucose Monitoring Suppl (ONE TOUCH ULTRA 2) w/Device KIT USE UP TO FOUR TIMES DAILY AS DIRECTED  02/16/21   [name], NP  buPROPion (WELLBUTRIN SR) 150 MG 12 hr tablet Take 1 tablet (150 mg total) by mouth daily. 07/28/21   Ngetich, Dinah C, NP  clonazePAM (KLONOPIN) 1 MG tablet TAKE 1 TABLET(1 MG) BY MOUTH THREE TIMES DAILY AS NEEDED 08/02/22   Ngetich, Dinah C, NP  losartan (COZAAR) 100 MG tablet TAKE 1/2 TABLET BY MOUTH IN THE MORNING AND IN THE EVENING 03/20/22   [name], NP  metFORMIN (GLUCOPHAGE) 500 MG tablet TAKE 1 TABLET BY MOUTH WITH BREAKFAST AND WITH SUPPER FOR DIABETES 11/17/21   [name], NP  pantoprazole (PROTONIX) 40 MG tablet TAKE 1 TABLET(40 MG) BY MOUTH DAILY 08/22/22   [name], NP  traZODone (DESYREL) 50 MG tablet To start 25 mg by mouth at bedtime for 2 weeks then increase to 50 mg by mouth at bedtime 03/03/22   [name], NP      Allergies    Oxaprozin    Review of Systems   Review of Systems  All other systems reviewed and are negative.   Physical Exam Updated Vital Signs BP (!) 150/75 (BP Location: Right Arm)   Pulse 72   Temp 97.9 F (36.6 C) (Oral)   Resp 18   Ht _0  (1.803 m)   Wt 61 kg   LMP  (LMP Unknown)   SpO2 97%   BMI 18.76 kg/m  Physical Exam Vitals and nursing note reviewed.  Constitutional:  General: She is not in acute distress.    Appearance: She is well-developed.  HENT:     Head: Normocephalic and atraumatic.  Eyes:     Conjunctiva/sclera: Conjunctivae normal.  Cardiovascular:     Rate and Rhythm: Normal rate and regular rhythm.  Pulmonary:     Effort: Pulmonary effort is normal.  Abdominal:     Palpations: Abdomen is soft.     Tenderness: There is no abdominal tenderness.  Musculoskeletal:     Cervical back: Neck supple.  Skin:    Findings: No rash.  Neurological:     Mental Status: She is alert and oriented to person, place, and time.     GCS: GCS eye subscore is 4. GCS verbal subscore is 5. GCS motor subscore is 6.     Cranial Nerves: Cranial nerves 2-12 are intact.      Sensory: Sensation is intact.     Motor: Motor function is intact.     Coordination: Coordination is intact.  Psychiatric:        Mood and Affect: Mood normal.     ED Results / Procedures / Treatments   Labs (all labs ordered are listed, but only abnormal results are displayed) Labs Reviewed  COMPREHENSIVE METABOLIC PANEL - Abnormal; Notable for the following components:      Result Value   Glucose, Bld 158 (*)    Total Protein 5.8 (*)    AST 10 (*)    Alkaline Phosphatase 32 (*)    All other components within normal limits  CBG MONITORING, ED - Abnormal; Notable for the following components:   Glucose-Capillary 173 (*)    All other components within normal limits  CBC  URINALYSIS, ROUTINE W REFLEX MICROSCOPIC    EKG EKG Interpretation  Date/Time:  Wednesday September 20 2022 20:14:18 EST Ventricular Rate:  71 PR Interval:  180 QRS Duration: 99 QT Interval:  421 QTC Calculation: 458 R Axis:   50 Text Interpretation: Sinus rhythm Consider anterior infarct when compared to prior, similar appearance. NO STEMI Confirmed by Antony Blackbird [phone]) on 09/20/2022 8:38:33 PM  Radiology CT HEAD WO CONTRAST  Result Date: 09/20/2022 CLINICAL DATA:  Syncope, VP shunt EXAM: CT HEAD WITHOUT CONTRAST TECHNIQUE: Contiguous axial images were obtained from the base of the skull through the vertex without intravenous contrast. RADIATION DOSE REDUCTION: This exam was performed according to the departmental dose-optimization program which includes automated exposure control, adjustment of the mA and/or kV according to patient size and/or use of iterative reconstruction technique. COMPARISON:  CTA head dated 09/12/2022 FINDINGS: Brain: No evidence of acute infarction, hemorrhage, hydrocephalus, or mass lesion/mass effect. Bilateral chronic low-density subdural collections measuring 7 mm on the right and 5 mm on the left (coronal image 35), grossly unchanged. Indwelling left frontal approach  ventriculostomy catheter. Ventricular size is unchanged. Subcortical white matter and periventricular small vessel ischemic changes. Vascular: Intracranial atherosclerosis. Prior left ACA aneurysm coil. Skull: Prior right frontal burr hole and postsurgical changes related to prior right frontal ventriculostomy catheter. Negative for fracture or focal lesion. Sinuses/Orbits: The visualized paranasal sinuses are essentially clear. The mastoid air cells are unopacified. Other: None. IMPRESSION: Indwelling left frontal approach ventriculostomy catheter with stable ventricular size. Stable bilateral chronic low-density subdural collections measuring 7 mm on the right and 5 mm on the left. Additional stable ancillary findings as above. Electronically Signed   By: Julian Hy M.D.   On: 09/20/2022 23:48    Procedures Procedures  {Document cardiac monitor, telemetry assessment procedure  when appropriate:1}  Medications Ordered in ED Medications  acetaminophen (TYLENOL) tablet 1,000 mg (has no administration in time range)  sodium chloride 0.9 % bolus 1,000 mL (1,000 mLs Intravenous New Bag/Given 09/20/22 2228)    ED Course/ Medical Decision Making/ A&P                           Medical Decision Making Amount and/or Complexity of Data Reviewed Labs: ordered. Radiology: ordered.   BP (!) 150/75 (BP Location: Right Arm)   Pulse 72   Temp 97.9 F (36.6 C) (Oral)   Resp 18   Ht _0  (1.803 m)   Wt 61 kg   LMP  (LMP Unknown)   SpO2 97%   BMI 18.76 kg/m   10:32 PM 81 year old female significant history of neurocardiogenic syncope, diabetes, has pacemaker, prior intracerebral bleed brought here via EMS from home for evaluation of unresponsiveness.  I was able to obtain history from patient.  Patient report she felt tired today.  She was in the labor room when she had an apparent syncopal episode that she did not recall.  She denies any precipitating symptoms prior to her passing out.  She  denies any injury or falling from the events.  They report the patient was having right arm shaking and she went unresponsive lasting for several minutes.  She also has some diarrhea.  Patient states she has had recurrent diarrhea for the past 3 to 4 months.  States she would have 2-3 bouts per day which did improve for several weeks but resumed again today.  She has not been evaluated for this.  She denies any changes to medication and no recent antibiotic use.  She does not endorse any significant abdominal pain.  She does endorse some mild head discomfort but no nausea no fever runny nose sneezing coughing chest pain shortness of breath dysuria focal numbness or focal weakness.  She denies tongue biting or bowel bladder incontinence.  She does have a pacemaker.  On exam patient is mentating appropriately she is alert and oriented x4.  She does not have any focal neurodeficit.  She is without any signs of injury.  Heart lung sounds normal abdomen is soft and nontender.  Vital signs remarkable for elevated blood pressure of 150/75.  She is afebrile, no hypoxia.  11:19 PM I was able to communicate with patient's daughter over the phone.  Daughter reported patient was of her usual state of health today until she begins to stare off into space after stating that she does not feel well.  She then had a syncopal episode follows with tremors (describing R arm shaking) suspicion of seizure-like activity.  Patient also has a pacemaker, will request to have Biotronik dual chamber  pacemaker interrogated.  Reviewed prior EMR, patient was last seen in the ED on 09/12/2022 for essentially a similar presentation.  She was reported had a tenderness episodes of aphasia followed by right-sided headache.  A work-up was remarkable for UDS positive for benzodiazepine for which she was prescribed by her doctor. a CTA  head with and without contrast was performed showing a chronic subdural hematomas that has decreased in size  and no acute changes.  Neurology was consulted at that time and felt MRI is not indicated and no suspicion for acute stroke and no clear seizure activity.  Patient was subsequently discharged home.  12:02 AM I appreciate consultation from hospitalist Dr. Roel Cluck who agrees to admit patient  but request for neurology to weigh in and give recommendation whether patient will need to be started on antiseizure medication.  -Labs ordered, independently viewed and interpreted by me.  Labs remarkable for CBG 173 -The patient was maintained on a cardiac monitor.  I personally viewed and interpreted the cardiac monitored which showed an underlying rhythm of: NSR -Imaging independently viewed and interpreted by me and I agree with radiologist's interpretation.  Result remarkable for head CT showing stable bilateral chronic subdural collections and also indwelling left frontal VP shunt -This patient presents to the ED for concern of syncope, this involves an extensive number of treatment options, and is a complaint that carries with it a high risk of complications and morbidity.  The differential diagnosis includes cardiogenic syncope, dehydration, anemia, cardiac arrhythmia, pace maker misfiring, seizure, stroke -Co morbidities that complicate the patient evaluation includes hx of neurocardiogenic syncope, pacemaker, hx of intracerebral bleed -Treatment includes IVF -Reevaluation of the patient after these medicines showed that the patient improved -PCP office notes or outside notes reviewed -Discussion with specialist *** -Social Determinant of Health considered which includes ***   {Document critical care time when appropriate:1} {Document review of labs and clinical decision tools ie heart score, Chads2Vasc2 etc:1}  {Document your independent review of radiology images, and any outside records:1} {Document your discussion with family members, caretakers, and with consultants:1} {Document social  determinants of health affecting pt's care:1} {Document your decision making why or why not admission, treatments were needed:1} Final Clinical Impression(s) / ED Diagnoses Final diagnoses:  None    Rx / DC Orders ED Discharge Orders     None

## 2022-09-20 NOTE — Progress Notes (Signed)
Pt had neurologic complaints the team used the order set for strokes, TIAs, or new neurologic problems. As such, the PT and PTT were ordered.

## 2022-09-20 NOTE — H&P (Signed)
Mary Higgins NVB:[id number] DOB: 01-Jan-1941 DOA: 09/20/2022     PCP: [name], NP   Outpatient Specialists:  CARDSLinward Natal, MD   Neurology  Patient arrived to ER on 09/20/22 at 2014 Referred by Attending Tegeler, Gwenyth Allegra, *   Patient coming from:    home Lives  With family    Chief Complaint: syncope, right hand tremor   HPI: Mary Higgins is a 81 y.o. female with medical history significant of ICH, brain aneurism sp coiling, ventricular shunt, cardiac syncope, sp pacemaker  Dm2, HTN, COPD, GERD  Presented with   possible syncope right hand tremor Patient had an episode of right arm shaking and then she became unresponsive lasted for about 4 minutes today had some diarrhea associated.  Blood pressure on arrival by EMS 130/82 pulse 72 satting 97% on room air CBG checked was 132 Patient denies any injury did not hit her head she has been having recurrent diarrhea for past 3 to 4 months.  About 2-3 bouts per day no abdominal pain no fever no nausea no cough no chest pain no localized neurological deficits no tongue biting no bladder or bowel incontinence associated with this. Patient was seen last week for slurred speech and headaches Was seen on 31 August with headache 5 out of 10 on the right side also had about 10 minutes episode of aphasia. At that time CTA head was done that showed 6 mm thick bilateral cerebral convexity hypodense subdural collections consistent with chronic subdural hematomas which seem to be improving no acute bleed was noted on thin place previous "embolization of anterior communicating artery no new aneurysms no intracranial vascular showed significant stenosis occlusion was noted Patient was diagnosed with headache and was able to go home  Quit smoking never drank etoh    Regarding pertinent Chronic problems:    Hyperlipidemia -  not on statins   Lipid Panel     Component Value Date/Time   CHOL 125 08/03/2021  2022   CHOL 135 03/14/2016 1649   TRIG 114 08/05/2021 0529   HDL 34 (L) 08/03/2021 2022   HDL 46 03/14/2016 1649   CHOLHDL 3.7 08/03/2021 2022   VLDL 20 08/03/2021 2022   [place] 71 08/03/2021 2022   [place] 30 07/07/2020 1455   LABVLDL 31 03/14/2016 1649     HTN on Cozaar      DM 2 -  Lab Results  Component Value Date   HGBA1C 5.9 (H) 08/02/2022   diet controlled       COPD - not  followed by pulmonology   not  on baseline oxygen       While in ER:   CT head unremarkable Patient back to baseline     CT HEAD Indwelling left frontal approach ventriculostomy catheter with stable ventricular size.   Stable bilateral chronic low-density subdural collections measuring 7 mm on the right and 5 mm on the left.  CXR -  NON acute    Following Medications were ordered in ER: Medications  acetaminophen (TYLENOL) tablet 1,000 mg (has no administration in time range)  sodium chloride 0.9 % bolus 1,000 mL (1,000 mLs Intravenous New Bag/Given 09/20/22 2228)    _______________________________________________________ ER Provider Called: Neurology        ED Triage Vitals  Enc Vitals Group     BP 09/20/22 2028 (!) 150/75     Pulse Rate 09/20/22 2028 72     Resp 09/20/22 2028 18  Temp 09/20/22 2016 97.8 F (36.6 C)     Temp Source 09/20/22 2016 Oral     SpO2 09/20/22 2023 97 %     Weight 09/20/22 2030 134 lb 7.7 oz (61 kg)     Height 09/20/22 2030 5' 11" (1.803 m)     Head Circumference --      Peak Flow --      Pain Score 09/20/22 2029 4     Pain Loc --      Pain Edu? --      Excl. in [place]? --   TMAX(24)@     _________________________________________ Significant initial  Findings: Abnormal Labs Reviewed  COMPREHENSIVE METABOLIC PANEL - Abnormal; Notable for the following components:      Result Value   Glucose, Bld 158 (*)    Total Protein 5.8 (*)    AST 10 (*)    Alkaline Phosphatase 32 (*)    All other components within normal limits  CBG MONITORING, ED -  Abnormal; Notable for the following components:   Glucose-Capillary 173 (*)    All other components within normal limits  81 year old   _________________________ Troponin  ordered ECG: Ordered Personally reviewed and interpreted by me showing: HR : 64  Sinus rhythm Anteroseptal infarct, old when compared to prior, slower rate. No STEMI  QTC 446    The recent clinical data is shown below. Vitals:   09/20/22 2023 09/20/22 2028 09/20/22 2030 09/20/22 2215  BP:  (!) 150/75  (!) 140/73  Pulse:  72  80  Resp:  18  12  Temp:  97.9 F (36.6 C)    TempSrc:  Oral    SpO2: 97% 97%  97%  Weight:   61 kg   Height:   5' 11" (1.803 m)     WBC     Component Value Date/Time   WBC 8.2 09/20/2022 2228   LYMPHSABS 1.8 09/12/2022 1500   MONOABS 0.4 09/12/2022 1500   EOSABS 0.1 09/12/2022 1500   BASOSABS 0.1 09/12/2022 1500      UA   no evidence of UTI    Urine analysis:    Component Value Date/Time   COLORURINE YELLOW 09/20/2022 2340   APPEARANCEUR CLEAR 09/20/2022 2340   LABSPEC 1.025 09/20/2022 2340   PHURINE 6.0 09/20/2022 2340   GLUCOSEU NEGATIVE 09/20/2022 2340   HGBUR NEGATIVE 09/20/2022 2340   BILIRUBINUR NEGATIVE 09/20/2022 2340   KETONESUR 5 (A) 09/20/2022 2340   PROTEINUR 100 (A) 09/20/2022 2340   NITRITE NEGATIVE 09/20/2022 2340   LEUKOCYTESUR NEGATIVE 09/20/2022 2340     _______________________________________________ Hospitalist was called for admission for seizure versus syncopal event  The following Work up has been ordered so far:  Orders Placed This Encounter  Procedures   CT HEAD WO CONTRAST   CBC   Urinalysis, Routine w reflex microscopic   Comprehensive metabolic panel   Document Height and Actual Weight   Saline Lock IV, Maintain IV access (when placed in a treatment room)   Nursing Communication Interrogate Brewing technologist for [place] Admission   Consult to hospitalist   CBG monitoring, ED   EKG 12-Lead   ED EKG     OTHER  Significant initial  Findings:  labs showing:    Recent Labs  Lab 09/20/22 2228  NA 137  K 4.0  CO2 25  GLUCOSE 158*  BUN 9  CREATININE 0.73  CALCIUM 9.0    Cr   stable,    Lab Results  Component Value Date   CREATININE 0.73 09/20/2022   CREATININE 0.60 09/12/2022   CREATININE 0.64 09/12/2022    Recent Labs  Lab 09/20/22 2228  AST 10*  ALT 6  ALKPHOS 32*  BILITOT 1.1  PROT 5.8*  ALBUMIN 3.6   Lab Results  Component Value Date   CALCIUM 9.0 09/20/2022          Plt: Lab Results  Component Value Date   PLT 224 09/20/2022       Recent Labs  Lab 09/20/22 2228  WBC 8.2  HGB 12.2  HCT 36.1  MCV 91.9  PLT 224    HG/HCT   stable,      Component Value Date/Time   HGB 12.2 09/20/2022 2228   HCT 36.1 09/20/2022 2228   MCV 91.9 09/20/2022 2228        DM  labs:  HbA1C: Recent Labs    12/05/21 1518 03/03/22 1455 08/02/22 1025  HGBA1C 5.9* 5.9* 5.9*      CBG (last 3)  Recent Labs    09/20/22 2016  GLUCAP 173*        Cultures: No results found for: "SDES", "SPECREQUEST", "CULT", "REPTSTATUS"   Radiological Exams on Admission: CT HEAD WO CONTRAST  Result Date: 09/20/2022 CLINICAL DATA:  Syncope, VP shunt EXAM: CT HEAD WITHOUT CONTRAST TECHNIQUE: Contiguous axial images were obtained from the base of the skull through the vertex without intravenous contrast. RADIATION DOSE REDUCTION: This exam was performed according to the departmental dose-optimization program which includes automated exposure control, adjustment of the mA and/or kV according to patient size and/or use of iterative reconstruction technique. COMPARISON:  CTA head dated 09/12/2022 FINDINGS: Brain: No evidence of acute infarction, hemorrhage, hydrocephalus, or mass lesion/mass effect. Bilateral chronic low-density subdural collections measuring 7 mm on the right and 5 mm on the left (coronal image 35), grossly unchanged. Indwelling left frontal approach ventriculostomy catheter.  Ventricular size is unchanged. Subcortical white matter and periventricular small vessel ischemic changes. Vascular: Intracranial atherosclerosis. Prior left ACA aneurysm coil. Skull: Prior right frontal burr hole and postsurgical changes related to prior right frontal ventriculostomy catheter. Negative for fracture or focal lesion. Sinuses/Orbits: The visualized paranasal sinuses are essentially clear. The mastoid air cells are unopacified. Other: None. IMPRESSION: Indwelling left frontal approach ventriculostomy catheter with stable ventricular size. Stable bilateral chronic low-density subdural collections measuring 7 mm on the right and 5 mm on the left. Additional stable ancillary findings as above. Electronically Signed   By: Julian Hy M.D.   On: 09/20/2022 23:48   _______________________________________________________________________________________________________ Latest  Blood pressure (!) 140/73, pulse 80, temperature 97.9 F (36.6 C), temperature source Oral, resp. rate 12, height 5' 11" (1.803 m), weight 61 kg, SpO2 97 %.   Vitals  labs and radiology finding personally reviewed  Review of Systems:    Pertinent positives include:   diarrhea, possible syncope  Constitutional:  No weight loss, night sweats, Fevers, chills, fatigue, weight loss  HEENT:  No headaches, Difficulty swallowing,Tooth/dental problems,Sore throat,  No sneezing, itching, ear ache, nasal congestion, post nasal drip,  Cardio-vascular:  No chest pain, Orthopnea, PND, anasarca, dizziness, palpitations.no Bilateral lower extremity swelling  GI:  No heartburn, indigestion, abdominal pain, nausea, vomiting, change in bowel habits, loss of appetite, melena, blood in stool, hematemesis Resp:  no shortness of breath at rest. No dyspnea on exertion, No excess mucus, no productive cough, No non-productive cough, No coughing up of blood.No change in color of mucus.No wheezing. Skin:  no rash or  lesions. No  jaundice GU:  no dysuria, change in color of urine, no urgency or frequency. No straining to urinate.  No flank pain.  Musculoskeletal:  No joint pain or no joint swelling. No decreased range of motion. No back pain.  Psych:  No change in mood or affect. No depression or anxiety. No memory loss.  Neuro: no localizing neurological complaints, no tingling, no weakness, no double vision, no gait abnormality, no slurred speech, no confusion  All systems reviewed and apart from [place] all are negative _______________________________________________________________________________________________ Past Medical History:   Past Medical History:  Diagnosis Date   Anxiety and depression    Hx of   Blepharospasm     Breast cancer ([place]) 1987   s/p lumpectomy.   Carotid bruit    Bilateral. Mild to moderate disease on LEFT and mild on the RIGHT in 2012   COPD (chronic obstructive pulmonary disease) (HCC)    mild. Not on inhalers.   Diabetes mellitus without complication ([place])    AODM   Fuchs' corneal dystrophy    GERD (gastroesophageal reflux disease)     Headache    Hyperlipidemia    Hypertension     Insomnia     Intracerebral bleed ([place]) 2003   Remote intracerebral blled and coiling by Dr. Estanislado Pandy for cerebral aneurysm and a shunt placed by Dr. Vertell Limber remotely. No seizure disorder and this happened in 2003 with an anterior communicating aneurysm.   Meningioma ([place])    left parafalcine calcified meningioma   Neurocardiogenic syncope    a. s/p Biotronik dual chamber pacemaker   Pacemaker    Personal history of radiation therapy    S/P VP shunt 2003   Sinus node dysfunction (HCC)     Tobacco abuse     Type II or unspecified type diabetes mellitus without mention of complication, not stated as uncontrolled       Past Surgical History:  Procedure Laterality Date   ANEURYSM COILING  11/13/2001   Cerebral aneurysm ;Dr. Luanne Bras   BREAST LUMPECTOMY Right    For cancer, no  recurrence   CEREBRAL ANEURYSM REPAIR  2023   CHOLECYSTECTOMY     CRANIOTOMY Right 08/04/2021   Procedure: BURR HOLES FOR SUBDURAL HEMATOMA;  Surgeon: Judith Part, MD;  Location: [facility];  Service: Neurosurgery;  Laterality: Right;   IR ANGIO INTRA EXTRACRAN SEL INTERNAL CAROTID UNI R MOD SED  09/30/2021   IR ANGIO VERTEBRAL SEL VERTEBRAL UNI R MOD SED  09/30/2021   IR TRANSCATH/EMBOLIZ  09/30/2021   PACEMAKER GENERATOR CHANGE N/A 09/25/2014   BTK dual chamber pacemkaer implanted by Dr Rayann Heman   PACEMAKER INSERTION  07/12/2006   [employer] Jude Victory XL DR  -  Dr. Tami Ribas   PARTIAL HYSTERECTOMY     RADIOLOGY WITH ANESTHESIA N/A 09/30/2021   Procedure: Arteriogram, Onyx embolization of right middle meningeal artery;  Surgeon: Consuella Lose, MD;  Location: [facility];  Service: Radiology;  Laterality: N/A;   VENTRICULOPERITONEAL SHUNT  11/13/2001   Dr. Erline Levine    Social History:  Ambulatory   independently or occasional      reports that she has been smoking cigarettes. She started smoking about 3 years ago. She has never used smokeless tobacco. She reports that she does not drink alcohol and does not use drugs.   Family History:   Family History  Problem Relation Age of Onset   Cancer Mother    Heart attack Father    Heart attack Son  Heart Pump placed    Diabetes Son    Hypertension Other    ______________________________________________________________________________________________ Allergies: Allergies  Allergen Reactions   Oxaprozin Rash     Prior to Admission medications   Medication Sig Start Date End Date Taking? Authorizing Provider  Artificial Tear Ointment (DRY EYES OP) Place 1 drop into both eyes daily as needed (dry eyes).    [provider]  Blood Glucose Monitoring Suppl (ONE TOUCH ULTRA 2) w/Device KIT USE UP TO FOUR TIMES DAILY AS DIRECTED 02/16/21   [name], NP  buPROPion (WELLBUTRIN SR) 150 MG 12 hr tablet Take 1 tablet  (150 mg total) by mouth daily. 07/28/21   Ngetich, Dinah C, NP  clonazePAM (KLONOPIN) 1 MG tablet TAKE 1 TABLET(1 MG) BY MOUTH THREE TIMES DAILY AS NEEDED 08/02/22   Ngetich, Dinah C, NP  losartan (COZAAR) 100 MG tablet TAKE 1/2 TABLET BY MOUTH IN THE MORNING AND IN THE EVENING 03/20/22   [name], NP  metFORMIN (GLUCOPHAGE) 500 MG tablet TAKE 1 TABLET BY MOUTH WITH BREAKFAST AND WITH SUPPER FOR DIABETES 11/17/21   [name], NP  pantoprazole (PROTONIX) 40 MG tablet TAKE 1 TABLET(40 MG) BY MOUTH DAILY 08/22/22   [name], NP  traZODone (DESYREL) 50 MG tablet To start 25 mg by mouth at bedtime for 2 weeks then increase to 50 mg by mouth at bedtime 03/03/22   [name], NP    ___________________________________________________________________________________________________ Physical Exam:    09/20/2022   10:15 PM 09/20/2022    8:30 PM 09/20/2022    8:28 PM  Vitals with BMI  Height  5' 11"   Weight  134 lbs 8 oz   BMI  13.24   Systolic 401  027  Diastolic 73  75  Pulse 80  72     1. General:  in No  Acute distress    Chronically ill   -appearing 2. Psychological: Alert and   Oriented 3. Head/ENT:  Dry Mucous Membranes                          Head Non traumatic, neck supple                          Poor Dentition 4. SKIN:  decreased Skin turgor,  Skin clean Dry and intact no rash 5. Heart: Regular rate and rhythm no  Murmur, no Rub or gallop 6. Lungs:  Clear to auscultation bilaterally, no wheezes or crackles   7. Abdomen: Soft,  non-tender, Non distended  bowel sounds present 8. Lower extremities: no clubbing, cyanosis, no  edema 9. Neurologically  strength 5 out of 5 in all 4 extremities cranial nerves II through XII intact 10. MSK: Normal range of motion    Chart has been reviewed  _____________________________________________________________  Assessment/Plan  81 y.o. female with medical history significant of ICH, brain aneurism sp coiling,  ventricular shunt, cardiac syncope, sp pacemaker  Dm2, HTN, COPD, GERD  Admitted for seizure versus syncopal episode with persistent diarrhea   Present on Admission:  Syncope  Hyperlipidemia  Diarrhea  Primary hypertension  Type 2 diabetes mellitus with hyperglycemia, without long-term current use of insulin ([place])   Syncope Unclear etiology whether this was a syncopal episode versus seizure episode. Cycle cardiac enzymes obtain echogram Pacemaker has been interrogated Appreciate neurology consult If echogram abnormal or troponins abnormal would need cardiology consult  Hyperlipidemia Chronic  stable  Diarrhea Obtain gastric studies this has been recurrent issue will need to have follow-up with GI no abdominal pain  Primary hypertension Allow permissive hypertension for tonight  Type 2 diabetes mellitus with hyperglycemia, without long-term current use of insulin (HCC) Order sliding scale hold p.o. medications  S/P coil embolization of cerebral aneurysm Not a candidate for MRI    Other plan as per orders.  DVT prophylaxis:  SCD    Code Status:    Code Status: Prior FULL CODE  as per patient   I had personally discussed CODE STATUS with patient     Family Communication:   Family not at  Bedside    Disposition Plan:          To home once workup is complete and patient is stable   Following barriers for discharge:                                                 Will need consultants to evaluate patient prior to discharge                       Would benefit from PT/OT eval prior to DC  Ordered                   Swallow eval - SLP ordered                                     Consults called:  ER to consult Neurology  Admission status:  ED Disposition     ED Disposition  [place]: [place] [[id number]]  Level of Care: Telemetry Cardiac [[id number]]  May place patient in observation at [facility] or [place]  if equivalent level of care is available:: No  Covid Evaluation: Asymptomatic - no recent exposure (last 10 days) testing not required  Diagnosis: Syncope [[id number]]  Admitting Physician: Toy Baker [[id number]]  Attending Physician: Toy Baker [[id number]]           Obs     Level of care     tele  For 12H    Matthewjames Petrasek 09/21/2022, 12:50 AM    [facility]     after 2 AM please page floor coverage PA If 7AM-7PM, please contact the day team taking care of the patient using [email or web address]   Patient was evaluated in the context of the global COVID-19 pandemic, which necessitated consideration that the patient might be at risk for infection with the SARS-CoV-2 virus that causes COVID-19. Institutional protocols and algorithms that pertain to the evaluation of patients at risk for COVID-19 are in a state of rapid change based on information released by regulatory bodies including the CDC and federal and state organizations. These policies and algorithms were followed during the patient's care.

## 2022-09-20 NOTE — ED Provider Notes (Signed)
[facility] EMERGENCY DEPARTMENT Provider Note   CSN: [id number] Arrival date & time: 09/20/22  2014     History  No chief complaint on file.   Mary Higgins is a 81 y.o. female.  The history is provided by the patient and medical records. No language interpreter was used.     81 year old female significant history of neurocardiogenic syncope, diabetes, has pacemaker, prior intracerebral bleed brought here via EMS from home for evaluation of unresponsiveness.  I was able to obtain history from patient.  Patient report she felt tired today.  She was in the labor room when she had an apparent syncopal episode that she did not recall.  She denies any precipitating symptoms prior to her passing out.  She denies any injury or falling from the events.  They report the patient was having right arm shaking and she went unresponsive lasting for several minutes.  She also has some diarrhea.  Patient states she has had recurrent diarrhea for the past 3 to 4 months.  States she would have 2-3 bouts per day which did improve for several weeks but resumed again today.  She has not been evaluated for this.  She denies any changes to medication and no recent antibiotic use.  She does not endorse any significant abdominal pain.  She does endorse some mild head discomfort but no nausea no fever runny nose sneezing coughing chest pain shortness of breath dysuria focal numbness or focal weakness.  She denies tongue biting or bowel bladder incontinence.  She does have a pacemaker.  Home Medications Prior to Admission medications   Medication Sig Start Date End Date Taking? Authorizing Provider  Artificial Tear Ointment (DRY EYES OP) Place 1 drop into both eyes daily as needed (dry eyes).    [provider]  Blood Glucose Monitoring Suppl (ONE TOUCH ULTRA 2) w/Device KIT USE UP TO FOUR TIMES DAILY AS DIRECTED 02/16/21   [name], NP  buPROPion (WELLBUTRIN SR) 150 MG 12 hr  tablet Take 1 tablet (150 mg total) by mouth daily. 07/28/21   Ngetich, Dinah C, NP  clonazePAM (KLONOPIN) 1 MG tablet TAKE 1 TABLET(1 MG) BY MOUTH THREE TIMES DAILY AS NEEDED 08/02/22   Ngetich, Dinah C, NP  losartan (COZAAR) 100 MG tablet TAKE 1/2 TABLET BY MOUTH IN THE MORNING AND IN THE EVENING 03/20/22   [name], NP  metFORMIN (GLUCOPHAGE) 500 MG tablet TAKE 1 TABLET BY MOUTH WITH BREAKFAST AND WITH SUPPER FOR DIABETES 11/17/21   [name], NP  pantoprazole (PROTONIX) 40 MG tablet TAKE 1 TABLET(40 MG) BY MOUTH DAILY 08/22/22   [name], NP  traZODone (DESYREL) 50 MG tablet To start 25 mg by mouth at bedtime for 2 weeks then increase to 50 mg by mouth at bedtime 03/03/22   [name], NP      Allergies    Oxaprozin    Review of Systems   Review of Systems  All other systems reviewed and are negative.   Physical Exam Updated Vital Signs BP (!) 150/75 (BP Location: Right Arm)   Pulse 72   Temp 97.9 F (36.6 C) (Oral)   Resp 18   Ht _0  (1.803 m)   Wt 61 kg   LMP  (LMP Unknown)   SpO2 97%   BMI 18.76 kg/m  Physical Exam Vitals and nursing note reviewed.  Constitutional:      General: She is not in acute distress.  Appearance: She is well-developed.  HENT:     Head: Normocephalic and atraumatic.  Eyes:     Conjunctiva/sclera: Conjunctivae normal.  Cardiovascular:     Rate and Rhythm: Normal rate and regular rhythm.  Pulmonary:     Effort: Pulmonary effort is normal.  Abdominal:     Palpations: Abdomen is soft.     Tenderness: There is no abdominal tenderness.  Musculoskeletal:     Cervical back: Neck supple.  Skin:    Findings: No rash.  Neurological:     Mental Status: She is alert and oriented to person, place, and time.     GCS: GCS eye subscore is 4. GCS verbal subscore is 5. GCS motor subscore is 6.     Cranial Nerves: Cranial nerves 2-12 are intact.     Sensory: Sensation is intact.     Motor: Motor function is intact.      Coordination: Coordination is intact.  Psychiatric:        Mood and Affect: Mood normal.     ED Results / Procedures / Treatments   Labs (all labs ordered are listed, but only abnormal results are displayed) Labs Reviewed  URINALYSIS, ROUTINE W REFLEX MICROSCOPIC - Abnormal; Notable for the following components:      Result Value   Ketones, ur 5 (*)    Protein, ur 100 (*)    All other components within normal limits  COMPREHENSIVE METABOLIC PANEL - Abnormal; Notable for the following components:   Glucose, Bld 158 (*)    Total Protein 5.8 (*)    AST 10 (*)    Alkaline Phosphatase 32 (*)    All other components within normal limits  CBG MONITORING, ED - Abnormal; Notable for the following components:   Glucose-Capillary 173 (*)    All other components within normal limits  CBC  CBC WITH DIFFERENTIAL/PLATELET  CK  LACTIC ACID, PLASMA  LACTIC ACID, PLASMA  MAGNESIUM  PHOSPHORUS  PREALBUMIN  TSH  URINALYSIS, COMPLETE (UACMP) WITH MICROSCOPIC  TROPONIN I (HIGH SENSITIVITY)    EKG EKG Interpretation  Date/Time:  Wednesday September 20 2022 20:14:18 EST Ventricular Rate:  71 PR Interval:  180 QRS Duration: 99 QT Interval:  421 QTC Calculation: 458 R Axis:   50 Text Interpretation: Sinus rhythm Consider anterior infarct when compared to prior, similar appearance. NO STEMI Confirmed by Antony Blackbird [phone]) on 09/20/2022 8:38:33 PM  Radiology CT HEAD WO CONTRAST  Result Date: 09/20/2022 CLINICAL DATA:  Syncope, VP shunt EXAM: CT HEAD WITHOUT CONTRAST TECHNIQUE: Contiguous axial images were obtained from the base of the skull through the vertex without intravenous contrast. RADIATION DOSE REDUCTION: This exam was performed according to the departmental dose-optimization program which includes automated exposure control, adjustment of the mA and/or kV according to patient size and/or use of iterative reconstruction technique. COMPARISON:  CTA head dated 09/12/2022 FINDINGS:  Brain: No evidence of acute infarction, hemorrhage, hydrocephalus, or mass lesion/mass effect. Bilateral chronic low-density subdural collections measuring 7 mm on the right and 5 mm on the left (coronal image 35), grossly unchanged. Indwelling left frontal approach ventriculostomy catheter. Ventricular size is unchanged. Subcortical white matter and periventricular small vessel ischemic changes. Vascular: Intracranial atherosclerosis. Prior left ACA aneurysm coil. Skull: Prior right frontal burr hole and postsurgical changes related to prior right frontal ventriculostomy catheter. Negative for fracture or focal lesion. Sinuses/Orbits: The visualized paranasal sinuses are essentially clear. The mastoid air cells are unopacified. Other: None. IMPRESSION: Indwelling left frontal approach ventriculostomy catheter with stable  ventricular size. Stable bilateral chronic low-density subdural collections measuring 7 mm on the right and 5 mm on the left. Additional stable ancillary findings as above. Electronically Signed   By: Julian Hy M.D.   On: 09/20/2022 23:48    Procedures Procedures    Medications Ordered in ED Medications  acetaminophen (TYLENOL) tablet 1,000 mg (has no administration in time range)  sodium chloride 0.9 % bolus 1,000 mL (1,000 mLs Intravenous New Bag/Given 09/20/22 2228)    ED Course/ Medical Decision Making/ A&P                           Medical Decision Making Amount and/or Complexity of Data Reviewed Labs: ordered. Radiology: ordered.   BP (!) 150/75 (BP Location: Right Arm)   Pulse 72   Temp 97.9 F (36.6 C) (Oral)   Resp 18   Ht _0  (1.803 m)   Wt 61 kg   LMP  (LMP Unknown)   SpO2 97%   BMI 18.76 kg/m   62:44 PM 81 year old female significant history of neurocardiogenic syncope, diabetes, has pacemaker, prior intracerebral bleed brought here via EMS from home for evaluation of unresponsiveness.  I was able to obtain history from patient.  Patient report  she felt tired today.  She was in the labor room when she had an apparent syncopal episode that she did not recall.  She denies any precipitating symptoms prior to her passing out.  She denies any injury or falling from the events.  They report the patient was having right arm shaking and she went unresponsive lasting for several minutes.  She also has some diarrhea.  Patient states she has had recurrent diarrhea for the past 3 to 4 months.  States she would have 2-3 bouts per day which did improve for several weeks but resumed again today.  She has not been evaluated for this.  She denies any changes to medication and no recent antibiotic use.  She does not endorse any significant abdominal pain.  She does endorse some mild head discomfort but no nausea no fever runny nose sneezing coughing chest pain shortness of breath dysuria focal numbness or focal weakness.  She denies tongue biting or bowel bladder incontinence.  She does have a pacemaker.  On exam patient is mentating appropriately she is alert and oriented x4.  She does not have any focal neurodeficit.  She is without any signs of injury.  Heart lung sounds normal abdomen is soft and nontender.  Vital signs remarkable for elevated blood pressure of 150/75.  She is afebrile, no hypoxia.  11:19 PM I was able to communicate with patient's daughter over the phone.  Daughter reported patient was of her usual state of health today until she begins to stare off into space after stating that she does not feel well.  She then had a syncopal episode follows with tremors (describing R arm shaking) suspicion of seizure-like activity.  Patient also has a pacemaker, will request to have Biotron[employer] dual chamber  pacemaker interrogated.  Reviewed prior EMR, patient was last seen in the ED on 09/12/2022 for essentially a similar presentation.  She was reported had a tenderness episodes of aphasia followed by right-sided headache.  A work-up was remarkable for UDS  positive for benzodiazepine for which she was prescribed by her doctor. a CTA  head with and without contrast was performed showing a chronic subdural hematomas that has decreased in size and no acute changes.  Neurology was consulted at that time and felt MRI is not indicated and no suspicion for acute stroke and no clear seizure activity.  Patient was subsequently discharged home.  12:02 AM I appreciate consultation from hospitalist Dr. Roel Cluck who agrees to admit patient but request for neurology to weigh in and give recommendation whether patient will need to be started on antiseizure medication.  I have consulted neurologist Dr. Curly Shores who will evaluate pt and will give recommendation.   -Labs ordered, independently viewed and interpreted by me.  Labs remarkable for CBG 173 -The patient was maintained on a cardiac monitor.  I personally viewed and interpreted the cardiac monitored which showed an underlying rhythm of: NSR -Imaging independently viewed and interpreted by me and I agree with radiologist's interpretation.  Result remarkable for head CT showing stable bilateral chronic subdural collections and also indwelling left frontal VP shunt -This patient presents to the ED for concern of syncope, this involves an extensive number of treatment options, and is a complaint that carries with it a high risk of complications and morbidity.  The differential diagnosis includes cardiogenic syncope, dehydration, anemia, cardiac arrhythmia, pace maker misfiring, seizure, stroke -Co morbidities that complicate the patient evaluation includes hx of neurocardiogenic syncope, pacemaker, hx of intracerebral bleed -Treatment includes IVF -Reevaluation of the patient after these medicines showed that the patient improved -PCP office notes or outside notes reviewed -Discussion with specialist neurologist Dr. Curly Shores who will evaluate pt and give recs -Social Determinant of Health considered which includes  tobacco use, reecommend cessation         Final Clinical Impression(s) / ED Diagnoses Final diagnoses:  Seizure-like activity ([place])  Syncope, unspecified syncope type    Rx / DC Orders ED Discharge Orders     None         Domenic Moras, PA-C 09/21/22 0013    Tegeler, Gwenyth Allegra, MD 09/23/22 1552

## 2022-09-20 NOTE — H&P (Incomplete)
Mary Higgins HQR:[id number] DOB: 1941/11/08 DOA: 09/20/2022     PCP: [name], NP   Outpatient Specialists: * NONE CARDS: * Dr. NEphrology: *  Dr. NEurology *   Dr. Pulmonary *  Dr.  Oncology * Dr. Fabienne Bruns* Dr.  Sadie Haber, [facility]) No care team member to display Urology Dr. *  Patient arrived to ER on 09/20/22 at 2014 Referred by Attending Tegeler, Gwenyth Allegra, *   Patient coming from:    home Lives alone,   *** With family From facility ***  Chief Complaint: *** No chief complaint on file.   HPI: Mary Higgins is a 81 y.o. female with medical history significant of ICH, brain aneurism sp coiling, ventricular shunt, cardiac syncope, sp pacemaker    Presented with   * Patient had an episode of right arm shaking and then she became unresponsive lasted for about 4 minutes today had some diarrhea associated.  Blood pressure on arrival by EMS 130/82 pulse 72 satting 97% on room air CBG checked was 132 Patient denies any injury did not hit her head she has been having recurrent diarrhea for past 3 to 4 months.  About 2-3 bouts per day no abdominal pain no fever no nausea no cough no chest pain no localized neurological deficits no tongue biting no bladder or bowel incontinence associated with this. Patient was seen last week for slurred speech and headaches Was seen on 31 August with headache 5 out of 10 on the right side also had about 10 minutes episode of aphasia. At that time CTA head was done that showed 6 mm thick bilateral cerebral convexity hypodense subdural collections consistent with chronic subdural hematomas which seem to be improving no acute bleed was noted on thin place previous "embolization of anterior communicating artery no new aneurysms no intracranial vascular showed significant stenosis occlusion was noted Patient was diagnosed with headache and was able to go home  Initial COVID TEST  NEGATIVE**** POSITIVE,  ***in house  PCR testing   Pending  Lab Results  Component Value Date   [place] NEGATIVE 09/30/2021   [place] NEGATIVE 08/03/2021     Regarding pertinent Chronic problems:    Hyperlipidemia - *on statins {statin:315258}  Lipid Panel     Component Value Date/Time   CHOL 125 08/03/2021 2022   CHOL 135 03/14/2016 1649   TRIG 114 08/05/2021 0529   HDL 34 (L) 08/03/2021 2022   HDL 46 03/14/2016 1649   CHOLHDL 3.7 08/03/2021 2022   VLDL 20 08/03/2021 2022   LDLCALC 71 08/03/2021 2022   [place] 30 07/07/2020 1455   LABVLDL 31 03/14/2016 1649     HTN on Cozaar  ***chronic CHF diastolic/systolic/ combined - last echo***  *** CAD  - On Aspirin, statin, betablocker, Plavix                 - *followed by cardiology                - last cardiac cath  The ASCVD Risk score (Arnett DK, et al., 2019) failed to calculate for the following reasons:   The 2019 ASCVD risk score is only valid for ages 28 to 89    ***DM 2 -  Lab Results  Component Value Date   HGBA1C 5.9 (H) 08/02/2022   ****on insulin, PO meds only, diet controlled  ***Hypothyroidism:  Lab Results  Component Value Date   TSH 1.30 08/02/2022   on synthroid  *** Morbid obesity-  BMI Readings from Last 1 Encounters:  09/20/22 18.76 kg/m     *** Asthma -well *** controlled on home inhalers/ nebs f                        ***last no prior***admission  ***                       No ***history of intubation  *** COPD - not **followed by pulmonology *** not  on baseline oxygen  *L,    *** OSA -on nocturnal oxygen, *CPAP, *noncompliant with CPAP  *** Hx of CVA - *with/out residual deficits on Aspirin 81 mg, 325, Plavix  ***A. Fib -  - CHA2DS2 vas score **** CHA2DS2/VAS Stroke Risk Points      N/A >= 2 Points: High Risk  1 - 1.99 Points: Medium Risk  0 Points: Low Risk    Last Change: N/A      This score determines the patient's risk of having a stroke if the  patient has atrial fibrillation.      This score is not applicable  to this patient. Components are not  calculated.     current  on anticoagulation with ****Coumadin  ***Xarelto,* Eliquis,  *** Not on anticoagulation secondary to Risk of Falls, *** recurrent bleeding         -  Rate control:  Currently controlled with ***Toprolol,  *Metoprolol,* Diltiazem, *Coreg          - Rhythm control: *** amiodarone, *flecainide  ***Hx of DVT/PE on - anticoagulation with ****Coumadin  ***Xarelto,* Eliquis,   ***CKD stage III*- baseline Cr **** Estimated Creatinine Clearance: 53.1 mL/min (by C-G formula based on SCr of 0.73 mg/dL).  Lab Results  Component Value Date   CREATININE 0.73 09/20/2022   CREATININE 0.60 09/12/2022   CREATININE 0.64 09/12/2022     **** Liver disease MELD 3.0: 7 at 09/12/2022  3:17 PM MELD-Na: 6 at 09/12/2022  3:17 PM Calculated from: Serum Creatinine: 0.60 mg/dL (Using min of 1 mg/dL) at 09/12/2022  3:17 PM Serum Sodium: 137 mmol/L at 09/12/2022  3:17 PM Total Bilirubin: 0.7 mg/dL (Using min of 1 mg/dL) at 09/12/2022  3:00 PM Serum Albumin: 3.5 g/dL at 09/12/2022  3:00 PM INR(ratio): 1.0 at 09/12/2022  3:00 PM Age at listing (hypothetical): 77 years Sex: Female at 09/12/2022  3:17 PM    ***BPH - on Flomax, Proscar    *** Dementia - on Aricept** Nemenda  *** Chronic anemia - baseline hg Hemoglobin & Hematocrit  Recent Labs    09/12/22 1500 09/12/22 1517 09/20/22 2228  HGB 11.9* 12.2 12.2     While in ER:       Ordered  CT HEAD Indwelling left frontal approach ventriculostomy catheter with stable ventricular size.   Stable bilateral chronic low-density subdural collections measuring 7 mm on the right and 5 mm on the left.  CXR - ***NON acute    Following Medications were ordered in ER: Medications  acetaminophen (TYLENOL) tablet 1,000 mg (has no administration in time range)  sodium chloride 0.9 % bolus 1,000 mL (1,000 mLs Intravenous New Bag/Given 09/20/22 2228)     _______________________________________________________ ER Provider Called:     DrMarland Kitchen  They Recommend admit to medicine *** Will see in AM  ***SEEN in ER   ED Triage Vitals  Enc Vitals Group     BP 09/20/22 2028 (!) 150/75     Pulse Rate 09/20/22 2028  72     Resp 09/20/22 2028 18     Temp 09/20/22 2016 97.8 F (36.6 C)     Temp Source 09/20/22 2016 Oral     SpO2 09/20/22 2023 97 %     Weight 09/20/22 2030 134 lb 7.7 oz (61 kg)     Height 09/20/22 2030 5' 11" (1.803 m)     Head Circumference --      Peak Flow --      Pain Score 09/20/22 2029 4     Pain Loc --      Pain Edu? --      Excl. in [place]? --   TMAX(24)@     _________________________________________ Significant initial  Findings: Abnormal Labs Reviewed  COMPREHENSIVE METABOLIC PANEL - Abnormal; Notable for the following components:      Result Value   Glucose, Bld 158 (*)    Total Protein 5.8 (*)    AST 10 (*)    Alkaline Phosphatase 32 (*)    All other components within normal limits  CBG MONITORING, ED - Abnormal; Notable for the following components:   Glucose-Capillary 173 (*)    All other components within normal limits  81 year old   _________________________ Troponin ***ordered ECG: Ordered Personally reviewed and interpreted by me showing: HR : 64  Sinus rhythm Anteroseptal infarct, old when compared to prior, slower rate. No STEMI  QTC 446    The recent clinical data is shown below. Vitals:   09/20/22 2023 09/20/22 2028 09/20/22 2030 09/20/22 2215  BP:  (!) 150/75  (!) 140/73  Pulse:  72  80  Resp:  18  12  Temp:  97.9 F (36.6 C)    TempSrc:  Oral    SpO2: 97% 97%  97%  Weight:   61 kg   Height:   5' 11" (1.803 m)         WBC     Component Value Date/Time   WBC 8.2 09/20/2022 2228   LYMPHSABS 1.8 09/12/2022 1500   MONOABS 0.4 09/12/2022 1500   EOSABS 0.1 09/12/2022 1500   BASOSABS 0.1 09/12/2022 1500        Lactic Acid, Venous No results found for:  "LATICACIDVEN"   Procalcitonin *** Ordered Lactic Acid, Venous No results found for: "LATICACIDVEN"   Procalcitonin *** Ordered   UA *** no evidence of UTI  ***Pending ***not ordered   Urine analysis:    Component Value Date/Time   COLORURINE STRAW (A) 09/12/2022 [place] 09/12/2022 1415   LABSPEC 1.010 09/12/2022 1415   PHURINE 7.0 09/12/2022 1415   GLUCOSEU NEGATIVE 09/12/2022 1415   HGBUR LARGE (A) 09/12/2022 1415   BILIRUBINUR NEGATIVE 09/12/2022 1415   KETONESUR NEGATIVE 09/12/2022 1415   PROTEINUR NEGATIVE 09/12/2022 1415   NITRITE NEGATIVE 09/12/2022 1415   LEUKOCYTESUR NEGATIVE 09/12/2022 1415    Results for orders placed or performed during the hospital encounter of 09/30/21  SARS Coronavirus 2 by RT PCR (hospital order, performed in [facility] hospital lab) Nasopharyngeal Nasopharyngeal Swab     Status: None   Collection Time: 09/30/21 11:28 AM   Specimen: Nasopharyngeal Swab  Result Value Ref Range Status   SARS Coronavirus 2 NEGATIVE NEGATIVE Final    Comment: (NOTE) SARS-CoV-2 target nucleic acids are NOT DETECTED.  The SARS-CoV-2 RNA is generally detectable in upper and lower respiratory specimens during the acute phase of infection. The lowest concentration of SARS-CoV-2 viral copies this assay can detect is 250 copies / mL. A  negative result does not preclude SARS-CoV-2 infection and should not be used as the sole basis for treatment or other patient management decisions.  A negative result may occur with improper specimen collection / handling, submission of specimen other than nasopharyngeal swab, presence of viral mutation(s) within the areas targeted by this assay, and inadequate number of viral copies (<250 copies / mL). A negative result must be combined with clinical observations, patient history, and epidemiological information.  Fact Sheet for Patients:   StrictlyIdeas.no  Fact Sheet for Healthcare  Providers: BankingDealers.co.za  This test is not yet approved or  cleared by the [place] FDA and has been authorized for detection and/or diagnosis of SARS-CoV-2 by FDA under an Emergency Use Authorization (EUA).  This EUA will remain in effect (meaning this test can be used) for the duration of the COVID-19 declaration under Section 564(b)(1) of the Act, 21 U.S.C. section 360bbb-3(b)(1), unless the authorization is terminated or revoked sooner.  Performed at [facility], [place] [address] [place], [place] [address]   MRSA Next Gen by PCR, Nasal     Status: None   Collection Time: 09/30/21  8:58 PM   Specimen: Nasal Mucosa; Nasal Swab  Result Value Ref Range Status   MRSA by PCR Next Gen NOT DETECTED NOT DETECTED Final    Comment: (NOTE) The GeneXpert MRSA Assay (FDA approved for NASAL specimens only), is one component of a comprehensive MRSA colonization surveillance program. It is not intended to diagnose MRSA infection nor to guide or monitor treatment for MRSA infections. Test performance is not FDA approved in patients less than 49 years old. Performed at [facility], [place] [address], [place], [place] [address]      _______________________________________________ Hospitalist was called for admission for *** There are no diagnoses linked to this encounter.   The following Work up has been ordered so far:  Orders Placed This Encounter  Procedures  . CT HEAD WO CONTRAST  . CBC  . Urinalysis, Routine w reflex microscopic  . Comprehensive metabolic panel  . Document Height and Actual Weight  . [place]ine Lock IV, Maintain IV access (when placed in a treatment room)  . Nursing Communication Interrogate pace maker  . Consult for [facility] Admission  . Consult to hospitalist  . CBG monitoring, ED  . EKG 12-Lead  . ED EKG     OTHER Significant initial  Findings:  labs showing:    Recent Labs  Lab 09/20/22 2228  NA  137  K 4.0  CO2 25  GLUCOSE 158*  BUN 9  CREATININE 0.73  CALCIUM 9.0    Cr  * stable,  Up from baseline see below Lab Results  Component Value Date   CREATININE 0.73 09/20/2022   CREATININE 0.60 09/12/2022   CREATININE 0.64 09/12/2022    Recent Labs  Lab 09/20/22 2228  AST 10*  ALT 6  ALKPHOS 32*  BILITOT 1.1  PROT 5.8*  ALBUMIN 3.6   Lab Results  Component Value Date   CALCIUM 9.0 09/20/2022          Plt: Lab Results  Component Value Date   PLT 224 09/20/2022       COVID-19 Labs  No results for input(s): "DDIMER", "FERRITIN", "LDH", "CRP" in the last 72 hours.  Lab Results  Component Value Date   SARSCOV2NAA NEGATIVE 09/30/2021   [place] NEGATIVE 08/03/2021     Arterial ***Venous  Blood Gas result:  pH *** pCO2 ***; pO2 ***;     %  O2 Sat ***.  ABG    Component Value Date/Time   PHART 7.468 (H) 08/04/2021 1528   PCO2ART 32.9 08/04/2021 1528   PO2ART 125 (H) 08/04/2021 1528   HCO3 23.8 08/04/2021 1528   TCO2 25 09/12/2022 1517   O2SAT 99.0 08/04/2021 1528         Recent Labs  Lab 09/20/22 2228  WBC 8.2  HGB 12.2  HCT 36.1  MCV 91.9  PLT 224    HG/HCT * stable,  Down *Up from baseline see below    Component Value Date/Time   HGB 12.2 09/20/2022 2228   HCT 36.1 09/20/2022 2228   MCV 91.9 09/20/2022 2228      No results for input(s): "LIPASE", "AMYLASE" in the last 168 hours. No results for input(s): "AMMONIA" in the last 168 hours.    Cardiac Panel (last 3 results) No results for input(s): "CKTOTAL", "CKMB", "TROPONINI", "RELINDX" in the last 72 hours.  .car BNP (last 3 results) No results for input(s): "BNP" in the last 8760 hours.    DM  labs:  HbA1C: Recent Labs    12/05/21 1518 03/03/22 1455 08/02/22 1025  HGBA1C 5.9* 5.9* 5.9*       CBG (last 3)  Recent Labs    09/20/22 2016  GLUCAP 173*          Cultures: No results found for: "SDES", "SPECREQUEST", "CULT", "REPTSTATUS"   Radiological Exams  on Admission: CT HEAD WO CONTRAST  Result Date: 09/20/2022 CLINICAL DATA:  Syncope, VP shunt EXAM: CT HEAD WITHOUT CONTRAST TECHNIQUE: Contiguous axial images were obtained from the base of the skull through the vertex without intravenous contrast. RADIATION DOSE REDUCTION: This exam was performed according to the departmental dose-optimization program which includes automated exposure control, adjustment of the mA and/or kV according to patient size and/or use of iterative reconstruction technique. COMPARISON:  CTA head dated 09/12/2022 FINDINGS: Brain: No evidence of acute infarction, hemorrhage, hydrocephalus, or mass lesion/mass effect. Bilateral chronic low-density subdural collections measuring 7 mm on the right and 5 mm on the left (coronal image 35), grossly unchanged. Indwelling left frontal approach ventriculostomy catheter. Ventricular size is unchanged. Subcortical white matter and periventricular small vessel ischemic changes. Vascular: Intracranial atherosclerosis. Prior left ACA aneurysm coil. Skull: Prior right frontal burr hole and postsurgical changes related to prior right frontal ventriculostomy catheter. Negative for fracture or focal lesion. Sinuses/Orbits: The visualized paranasal sinuses are essentially clear. The mastoid air cells are unopacified. Other: None. IMPRESSION: Indwelling left frontal approach ventriculostomy catheter with stable ventricular size. Stable bilateral chronic low-density subdural collections measuring 7 mm on the right and 5 mm on the left. Additional stable ancillary findings as above. Electronically Signed   By: Julian Hy M.D.   On: 09/20/2022 23:48   _______________________________________________________________________________________________________ Latest  Blood pressure (!) 140/73, pulse 80, temperature 97.9 F (36.6 C), temperature source Oral, resp. rate 12, height 5' 11" (1.803 m), weight 61 kg, SpO2 97 %.   Vitals  labs and radiology  finding personally reviewed  Review of Systems:    Pertinent positives include: ***  Constitutional:  No weight loss, night sweats, Fevers, chills, fatigue, weight loss  HEENT:  No headaches, Difficulty swallowing,Tooth/dental problems,Sore throat,  No sneezing, itching, ear ache, nasal congestion, post nasal drip,  Cardio-vascular:  No chest pain, Orthopnea, PND, anasarca, dizziness, palpitations.no Bilateral lower extremity swelling  GI:  No heartburn, indigestion, abdominal pain, nausea, vomiting, diarrhea, change in bowel habits, loss of appetite, melena, blood in stool, hematemesis Resp:  no shortness of breath at rest. No dyspnea on exertion, No excess mucus, no productive cough, No non-productive cough, No coughing up of blood.No change in color of mucus.No wheezing. Skin:  no rash or lesions. No jaundice GU:  no dysuria, change in color of urine, no urgency or frequency. No straining to urinate.  No flank pain.  Musculoskeletal:  No joint pain or no joint swelling. No decreased range of motion. No back pain.  Psych:  No change in mood or affect. No depression or anxiety. No memory loss.  Neuro: no localizing neurological complaints, no tingling, no weakness, no double vision, no gait abnormality, no slurred speech, no confusion  All systems reviewed and apart from [place] all are negative _______________________________________________________________________________________________ Past Medical History:   Past Medical History:  Diagnosis Date  . Anxiety and depression    Hx of  . Blepharospasm    . Breast cancer ([place]) 1987   s/p lumpectomy.  . Carotid bruit    Bilateral. Mild to moderate disease on LEFT and mild on the RIGHT in 2012  . COPD (chronic obstructive pulmonary disease) (H[facility])    mild. Not on inhalers.  . Diabetes mellitus without complication ([place])    AODM  . Fuchs' corneal dystrophy   . GERD (gastroesophageal reflux disease)    . Headache   .  Hyperlipidemia   . Hypertension    . Insomnia    . Intracerebral bleed ([place]) 2003   Remote intracerebral blled and coiling by Dr. Estanislado Pandy for cerebral aneurysm and a shunt placed by Dr. Vertell Limber remotely. No seizure disorder and this happened in 2003 with an anterior communicating aneurysm.  . Meningioma ([place])    left parafalcine calcified meningioma  . Neurocardiogenic syncope    a. s/p Biotronik dual chamber pacemaker  . Pacemaker   . Personal history of radiation therapy   . S/P VP shunt 2003  . Sinus node dysfunction (H[facility])    . Tobacco abuse    . Type II or unspecified type diabetes mellitus without mention of complication, not stated as uncontrolled        Past Surgical History:  Procedure Laterality Date  . ANEURYSM COILING  11/13/2001   Cerebral aneurysm ;Dr. Luanne Bras  . BREAST LUMPECTOMY Right    For cancer, no recurrence  . CEREBRAL ANEURYSM REPAIR  2023  . CHOLECYSTECTOMY    . CRANIOTOMY Right 08/04/2021   Procedure: BURR HOLES FOR SUBDURAL HEMATOMA;  Surgeon: Judith Part, MD;  Location: [facility];  Service: Neurosurgery;  Laterality: Right;  . IR ANGIO INTRA EXTRACRAN SEL INTERNAL CAROTID UNI R MOD SED  09/30/2021  . IR ANGIO VERTEBRAL SEL VERTEBRAL UNI R MOD SED  09/30/2021  . IR TRANSCATH/EMBOLIZ  09/30/2021  . PACEMAKER GENERATOR CHANGE N/A 09/25/2014   BTK dual chamber pacemkaer implanted by Dr Rayann Heman  . PACEMAKER INSERTION  07/12/2006   [employer] Jude Victory XL DR  -  Dr. Tami Ribas  . PARTIAL HYSTERECTOMY    . RADIOLOGY WITH ANESTHESIA N/A 09/30/2021   Procedure: Arteriogram, Onyx embolization of right middle meningeal artery;  Surgeon: Consuella Lose, MD;  Location: [facility];  Service: Radiology;  Laterality: N/A;  . VENTRICULOPERITONEAL SHUNT  11/13/2001   Dr. Erline Levine    Social History:  Ambulatory *** independently cane, walker  wheelchair bound, bed bound     reports that she has been smoking cigarettes. She started smoking about 3 years  ago. She has never used smokeless tobacco. She reports that she does  not drink alcohol and does not use drugs.     Family History: *** Family History  Problem Relation Age of Onset  . Cancer Mother   . Heart attack Father   . Heart attack Son        Heart Pump placed   . Diabetes Son   . Hypertension Other    ______________________________________________________________________________________________ Allergies: Allergies  Allergen Reactions  . Oxaprozin Rash     Prior to Admission medications   Medication Sig Start Date End Date Taking? Authorizing Provider  Artificial Tear Ointment (DRY EYES OP) Place 1 drop into both eyes daily as needed (dry eyes).    [provider]  Blood Glucose Monitoring Suppl (ONE TOUCH ULTRA 2) w/Device KIT USE UP TO FOUR TIMES DAILY AS DIRECTED 02/16/21   [name], NP  buPROPion (WELLBUTRIN SR) 150 MG 12 hr tablet Take 1 tablet (150 mg total) by mouth daily. 07/28/21   Ngetich, Dinah C, NP  clonazePAM (KLONOPIN) 1 MG tablet TAKE 1 TABLET(1 MG) BY MOUTH THREE TIMES DAILY AS NEEDED 08/02/22   Ngetich, Dinah C, NP  losartan (COZAAR) 100 MG tablet TAKE 1/2 TABLET BY MOUTH IN THE MORNING AND IN THE EVENING 03/20/22   [name], NP  metFORMIN (GLUCOPHAGE) 500 MG tablet TAKE 1 TABLET BY MOUTH WITH BREAKFAST AND WITH SUPPER FOR DIABETES 11/17/21   [name], NP  pantoprazole (PROTONIX) 40 MG tablet TAKE 1 TABLET(40 MG) BY MOUTH DAILY 08/22/22   [name], NP  traZODone (DESYREL) 50 MG tablet To start 25 mg by mouth at bedtime for 2 weeks then increase to 50 mg by mouth at bedtime 03/03/22   [name], NP    ___________________________________________________________________________________________________ Physical Exam:    09/20/2022   10:15 PM 09/20/2022    8:30 PM 09/20/2022    8:28 PM  Vitals with BMI  Height  5' 11"   Weight  134 lbs 8 oz   BMI  80.88   Systolic 110  315  Diastolic 73  75  Pulse 80   72     1. General:  in No ***Acute distress***increased work of breathing ***complaining of severe pain****agitated * Chronically ill *well *cachectic *toxic acutely ill -appearing 2. Psychological: Alert and *** Oriented 3. Head/ENT:   Moist *** Dry Mucous Membranes                          Head Non traumatic, neck supple                          Normal *** Poor Dentition 4. SKIN: normal *** decreased Skin turgor,  Skin clean Dry and intact no rash 5. Heart: Regular rate and rhythm no*** Murmur, no Rub or gallop 6. Lungs: ***Clear to auscultation bilaterally, no wheezes or crackles   7. Abdomen: Soft, ***non-tender, Non distended *** obese ***bowel sounds present 8. Lower extremities: no clubbing, cyanosis, no ***edema 9. Neurologically Grossly intact, moving all 4 extremities equally *** strength 5 out of 5 in all 4 extremities cranial nerves II through XII intact 10. MSK: Normal range of motion    Chart has been reviewed  ______________________________________________________________________________________________  Assessment/Plan  ***  Admitted for *** There are no diagnoses linked to this encounter.   Present on Admission: **None**     No problem-specific Assessment & Plan notes found for this encounter.    Other plan as per  orders.  DVT prophylaxis:  SCD *** Lovenox       Code Status:    Code Status: Prior FULL CODE *** DNR/DNI ***comfort care as per patient ***family  I had personally discussed CODE STATUS with patient and family* I had spent *min discussing goals of care and CODE STATUS    Family Communication:   Family not at  Bedside  plan of care was discussed on the phone with *** Son, Daughter, Wife, Husband, Sister, Brother , father, mother  Disposition Plan:   *** likely will need placement for rehabilitation                          Back to current facility when stable                            To home once workup is complete and patient is  stable  ***Following barriers for discharge:                            Electrolytes corrected                               Anemia corrected                             Pain controlled with PO medications                               Afebrile, white count improving able to transition to PO antibiotics                             Will need to be able to tolerate PO                            Will likely need home health, home O2, set up                           Will need consultants to evaluate patient prior to discharge  ****EXPECT DC tomorrow                    ***Would benefit from PT/OT eval prior to DC  Ordered                   Swallow eval - SLP ordered                   Diabetes care coordinator                   Transition of care consulted                   Nutrition    consulted                  Wound care  consulted                   Palliative care    consulted                   Behavioral health  consulted                    Consults called: ***    Admission status:  ED Disposition     None        Obs***  ***  inpatient     I Expect 2 midnight stay secondary to severity of patient's current illness need for inpatient interventions justified by the following: ***hemodynamic instability despite optimal treatment (tachycardia *hypotension * tachypnea *hypoxia, hypercapnia) * Severe lab/radiological/exam abnormalities including:     and extensive comorbidities including: *substance abuse  *Chronic pain *DM2  * CHF * CAD  * COPD/asthma *Morbid Obesity * CKD *dementia *liver disease *history of stroke with residual deficits *  malignancy, * sickle cell disease  History of amputation Chronic anticoagulation  That are currently affecting medical management.   I expect  patient to be hospitalized for 2 midnights requiring inpatient medical care.  Patient is at high risk for adverse outcome (such as loss of life or disability) if not  treated.  Indication for inpatient stay as follows:  Severe change from baseline regarding mental status Hemodynamic instability despite maximal medical therapy,  ongoing suicidal ideations,  severe pain requiring acute inpatient management,  inability to maintain oral hydration   persistent chest pain despite medical management Need for operative/procedural  intervention New or worsening hypoxia   Need for IV antibiotics, IV fluids, IV rate controling medications, IV antihypertensives, IV pain medications, IV anticoagulation, need for biPAP    Level of care   *** tele  For 12H 24H     medical floor       progressive tele indefinitely please discontinue once patient no longer qualifies COVID-19 Labs    Lab Results  Component Value Date   [place] NEGATIVE 09/30/2021     Precautions: admitted as *** Covid Negative  ***asymptomatic screening protocol****PUI *** covid positive No active isolations ***If Covid PCR is negative  - please DC precautions - would need additional investigation given very high risk for false native test result    Critical***  Patient is critically ill due to  hemodynamic instability * respiratory failure *severe sepsis* ongoing chest pain*  They are at high risk for life/limb threatening clinical deterioration requiring frequent reassessment and modifications of care.  Services provided include examination of the patient, review of relevant ancillary tests, prescription of lifesaving therapies, review of medications and prophylactic therapy.  Total critical care time excluding separately billable procedures: 60*  Minutes.    Rigo Letts 09/20/2022, 11:53 PM ***  [facility]     after 2 AM please page floor coverage PA If 7AM-7PM, please contact the day team taking care of the patient using [email or web address]   Patient was evaluated in the context of the global COVID-19 pandemic, which necessitated consideration that the patient might be at  risk for infection with the SARS-CoV-2 virus that causes COVID-19. Institutional protocols and algorithms that pertain to the evaluation of patients at risk for COVID-19 are in a state of rapid change based on information released by regulatory bodies including the CDC and federal and state organizations. These policies and algorithms were followed during the patient's care.

## 2022-09-20 NOTE — ED Triage Notes (Signed)
Pt BIB [facility] from home. Family called because her R arm was shaking for and she went unresponsive. This lasted for about 4 min total. She has also had some diarrhea.   EMS VS BP 130/82 P 72 O2 97 RA CBG 132

## 2022-09-20 NOTE — Subjective & Objective (Signed)
Patient had an episode of right arm shaking and then she became unresponsive lasted for about 4 minutes today had some diarrhea associated.  Blood pressure on arrival by EMS 130/82 pulse 72 satting 97% on room air CBG checked was 132 Patient denies any injury did not hit her head she has been having recurrent diarrhea for past 3 to 4 months.  About 2-3 bouts per day no abdominal pain no fever no nausea no cough no chest pain no localized neurological deficits no tongue biting no bladder or bowel incontinence associated with this.

## 2022-09-20 NOTE — ED Notes (Signed)
Daughter Delanna Notice [phone] would like an update asap

## 2022-09-21 ENCOUNTER — Observation Stay (HOSPITAL_COMMUNITY): Payer: Medicare Other

## 2022-09-21 ENCOUNTER — Observation Stay (HOSPITAL_BASED_OUTPATIENT_CLINIC_OR_DEPARTMENT_OTHER): Payer: Medicare Other

## 2022-09-21 DIAGNOSIS — R569 Unspecified convulsions: Secondary | ICD-10-CM | POA: Diagnosis not present

## 2022-09-21 DIAGNOSIS — R55 Syncope and collapse: Secondary | ICD-10-CM

## 2022-09-21 LAB — CK: Total CK: 27 U/L — ABNORMAL LOW (ref 38–234)

## 2022-09-21 LAB — CBC WITH DIFFERENTIAL/PLATELET
Abs Immature Granulocytes: 0.02 10*3/uL (ref 0.00–0.07)
Basophils Absolute: 0.1 10*3/uL (ref 0.0–0.1)
Basophils Relative: 1 %
Eosinophils Absolute: 0 10*3/uL (ref 0.0–0.5)
Eosinophils Relative: 1 %
HCT: 36.8 % (ref 36.0–46.0)
Hemoglobin: 12.2 g/dL (ref 12.0–15.0)
Immature Granulocytes: 0 %
Lymphocytes Relative: 35 %
Lymphs Abs: 2.8 10*3/uL (ref 0.7–4.0)
MCH: 30.4 pg (ref 26.0–34.0)
MCHC: 33.2 g/dL (ref 30.0–36.0)
MCV: 91.8 fL (ref 80.0–100.0)
Monocytes Absolute: 0.6 10*3/uL (ref 0.1–1.0)
Monocytes Relative: 8 %
Neutro Abs: 4.5 10*3/uL (ref 1.7–7.7)
Neutrophils Relative %: 55 %
Platelets: 243 10*3/uL (ref 150–400)
RBC: 4.01 MIL/uL (ref 3.87–5.11)
RDW: 13.2 % (ref 11.5–15.5)
WBC: 8.1 10*3/uL (ref 4.0–10.5)
nRBC: 0 % (ref 0.0–0.2)

## 2022-09-21 LAB — TROPONIN I (HIGH SENSITIVITY)
Troponin I (High Sensitivity): 11 ng/L (ref <18)
Troponin I (High Sensitivity): 13 ng/L (ref <18)

## 2022-09-21 LAB — CBG MONITORING, ED
Glucose-Capillary: 100 mg/dL — ABNORMAL HIGH (ref 70–99)
Glucose-Capillary: 137 mg/dL — ABNORMAL HIGH (ref 70–99)
Glucose-Capillary: 151 mg/dL — ABNORMAL HIGH (ref 70–99)
Glucose-Capillary: 175 mg/dL — ABNORMAL HIGH (ref 70–99)

## 2022-09-21 LAB — PHOSPHORUS
Phosphorus: 2.8 mg/dL (ref 2.5–4.6)
Phosphorus: 3 mg/dL (ref 2.5–4.6)

## 2022-09-21 LAB — URINALYSIS, ROUTINE W REFLEX MICROSCOPIC
Bacteria, UA: NONE SEEN
Bilirubin Urine: NEGATIVE
Glucose, UA: NEGATIVE mg/dL
Hgb urine dipstick: NEGATIVE
Ketones, ur: 5 mg/dL — AB
Leukocytes,Ua: NEGATIVE
Nitrite: NEGATIVE
Protein, ur: 100 mg/dL — AB
Specific Gravity, Urine: 1.025 (ref 1.005–1.030)
pH: 6 (ref 5.0–8.0)

## 2022-09-21 LAB — URINALYSIS, COMPLETE (UACMP) WITH MICROSCOPIC
Bilirubin Urine: NEGATIVE
Glucose, UA: NEGATIVE mg/dL
Hgb urine dipstick: NEGATIVE
Ketones, ur: NEGATIVE mg/dL
Leukocytes,Ua: NEGATIVE
Nitrite: NEGATIVE
Protein, ur: NEGATIVE mg/dL
Specific Gravity, Urine: 1.008 (ref 1.005–1.030)
pH: 7 (ref 5.0–8.0)

## 2022-09-21 LAB — COMPREHENSIVE METABOLIC PANEL
ALT: 10 U/L (ref 0–44)
AST: 13 U/L — ABNORMAL LOW (ref 15–41)
Albumin: 3.9 g/dL (ref 3.5–5.0)
Alkaline Phosphatase: 34 U/L — ABNORMAL LOW (ref 38–126)
Anion gap: 10 (ref 5–15)
BUN: 7 mg/dL — ABNORMAL LOW (ref 8–23)
CO2: 24 mmol/L (ref 22–32)
Calcium: 9 mg/dL (ref 8.9–10.3)
Chloride: 102 mmol/L (ref 98–111)
Creatinine, Ser: 0.66 mg/dL (ref 0.44–1.00)
GFR, Estimated: >60 mL/min (ref >60)
Glucose, Bld: 102 mg/dL — ABNORMAL HIGH (ref 70–99)
Potassium: 3.3 mmol/L — ABNORMAL LOW (ref 3.5–5.1)
Sodium: 136 mmol/L (ref 135–145)
Total Bilirubin: 1 mg/dL (ref 0.3–1.2)
Total Protein: 6.3 g/dL — ABNORMAL LOW (ref 6.5–8.1)

## 2022-09-21 LAB — ECHOCARDIOGRAM COMPLETE
Area-P 1/2: 3.08 cm2
Height: 71 in
S' Lateral: 2.01 cm
Weight: 2151.69 oz

## 2022-09-21 LAB — MAGNESIUM
Magnesium: 1.8 mg/dL (ref 1.7–2.4)
Magnesium: 2 mg/dL (ref 1.7–2.4)

## 2022-09-21 LAB — TSH: TSH: 1.538 u[IU]/mL (ref 0.350–4.500)

## 2022-09-21 LAB — PREALBUMIN: Prealbumin: 20 mg/dL (ref 18–38)

## 2022-09-21 LAB — LACTIC ACID, PLASMA: Lactic Acid, Venous: 0.9 mmol/L (ref 0.5–1.9)

## 2022-09-21 MED ORDER — CLONAZEPAM 1 MG PO TABS: 1.0000 mg | ORAL_TABLET | Freq: Three times a day (TID) | ORAL | 0 refills | Status: DC

## 2022-09-21 MED ORDER — BUPROPION HCL ER (SR) 150 MG PO TB12
150.0000 mg | ORAL_TABLET | Freq: Every day | ORAL | Status: DC
  Administered 2022-09-21: 150 mg via ORAL
  Filled 2022-09-21: qty 1

## 2022-09-21 MED ORDER — ACETAMINOPHEN 325 MG PO TABS: 650.0000 mg | ORAL_TABLET | Freq: Four times a day (QID) | ORAL | Status: DC | PRN

## 2022-09-21 MED ORDER — PANTOPRAZOLE SODIUM 40 MG PO TBEC
40.0000 mg | DELAYED_RELEASE_TABLET | Freq: Every day | ORAL | Status: DC
  Administered 2022-09-21: 40 mg via ORAL
  Filled 2022-09-21: qty 1

## 2022-09-21 MED ORDER — ACETAMINOPHEN 650 MG RE SUPP: 650.0000 mg | Freq: Four times a day (QID) | RECTAL | Status: DC | PRN

## 2022-09-21 MED ORDER — INSULIN ASPART 100 UNIT/ML IJ SOLN
0.0000 [IU] | INTRAMUSCULAR | Status: DC
  Administered 2022-09-21: 2 [IU] via SUBCUTANEOUS
  Administered 2022-09-21: 1 [IU] via SUBCUTANEOUS

## 2022-09-21 MED ORDER — SODIUM CHLORIDE 0.9 % IV SOLN: INTRAVENOUS | Status: DC

## 2022-09-21 MED ORDER — CLONAZEPAM 0.125 MG PO TBDP: 1.0000 mg | ORAL_TABLET | Freq: Three times a day (TID) | ORAL | Status: DC

## 2022-09-21 MED ORDER — HYDROCODONE-ACETAMINOPHEN 5-325 MG PO TABS
1.0000 | ORAL_TABLET | ORAL | Status: DC | PRN
  Filled 2022-09-21: qty 2

## 2022-09-21 NOTE — Hospital Course (Addendum)
81 year old woman PMH including previous subarachnoid hemorrhage, subdural hematoma, cerebral aneurysm status post IR coil embolization, VP shunt placement, pacemaker who was brought from home for right arm shaking and unresponsiveness.  Further history suggested syncopal episode with seizure-like activity afterwards.  Seen by neurology, EEG reassuring, no further neurology work-up recommended, cleared for discharge.  Ambulatory referral to Dr. April Manson in 8 weeks.  Seen October 31 for strokelike symptoms, slurred speech, aphasia.  Symptoms resolved.  EDP discussed with neurology who did not recommend further evaluation but rather outpatient follow-up.  No seizure activity noted.

## 2022-09-21 NOTE — Progress Notes (Signed)
EEG complete - results pending 

## 2022-09-21 NOTE — Consult Note (Addendum)
Neurology Consultation Reason for Consult: c/f seizure Requesting Physician: Toy Baker  CC: Spell of altered awareness  History is obtained from: Patient and chart review  HPI: Mary Higgins is a 81 y.o. female with a past medical history significant for hypertension, hyperlipidemia, cerebral aneurysm coiling (2003), VP shunt, left parafalcine calcified meningioma, neurocardiogenic syncope s/p dual-chamber pacemaker, Fuchs corneal dystrophy, blepharospasm on chronic Klonopin, remote breast cancer s/p lumpectomy, subdural hematoma s/p right-sided craniotomy (2022)  She reports she ran out of her Klonopin Monday for unclear reasons which she reports she takes for dry eye.  She reports she takes it regularly twice a day and sometimes takes an additional dose midday depending on how severe her symptoms are.  She is not sure why she ran out, and thinks she would benefit from someone helping manage her medications.  On review of the chart this is prescribed most likely for blepharospasm.  Due to her anxiety with being out of the medication she reports she tried taking some trazodone which she uses much less frequently (once or twice a week typically).  In the setting of taking two trazodone she does have some emesis.  She has also been struggling with diarrhea for some time noting a 30 pound weight loss and black stools (though the stools have now normalized).  I attempted to reach daughter for collateral about the event that happened this morning, but there was no answer.  Per chart review the patient had right arm shaking and unresponsiveness lasting for several minutes.  The patient does not recall this event at all.  She additionally had an episode of slurred speech and confusion 10/31 associated with hypotension  ROS: All other review of systems was negative except as noted in the HPI, within limits of patient's amnesia for the seizure-like event  Past Medical History:  Diagnosis Date    Anxiety and depression    Hx of   Blepharospasm     Breast cancer ([place]) 1987   s/p lumpectomy.   Carotid bruit    Bilateral. Mild to moderate disease on LEFT and mild on the RIGHT in 2012   COPD (chronic obstructive pulmonary disease) (HCC)    mild. Not on inhalers.   Diabetes mellitus without complication ([place])    AODM   Fuchs' corneal dystrophy    GERD (gastroesophageal reflux disease)     Headache    Hyperlipidemia    Hypertension     Insomnia     Intracerebral bleed ([place]) 2003   Remote intracerebral blled and coiling by Dr. Estanislado Pandy for cerebral aneurysm and a shunt placed by Dr. Vertell Limber remotely. No seizure disorder and this happened in 2003 with an anterior communicating aneurysm.   Meningioma ([place])    left parafalcine calcified meningioma   Neurocardiogenic syncope    a. s/p Biotronik dual chamber pacemaker   Pacemaker    Personal history of radiation therapy    S/P VP shunt 2003   Sinus node dysfunction (HCC)     Tobacco abuse     Type II or unspecified type diabetes mellitus without mention of complication, not stated as uncontrolled     Past Surgical History:  Procedure Laterality Date   ANEURYSM COILING  11/13/2001   Cerebral aneurysm ;Dr. Luanne Bras   BREAST LUMPECTOMY Right    For cancer, no recurrence   CEREBRAL ANEURYSM REPAIR  2023   CHOLECYSTECTOMY     CRANIOTOMY Right 08/04/2021   Procedure: BURR HOLES FOR SUBDURAL HEMATOMA;  Surgeon: Emelda Brothers  A, MD;  Location: [facility];  Service: Neurosurgery;  Laterality: Right;   IR ANGIO INTRA EXTRACRAN SEL INTERNAL CAROTID UNI R MOD SED  09/30/2021   IR ANGIO VERTEBRAL SEL VERTEBRAL UNI R MOD SED  09/30/2021   IR TRANSCATH/EMBOLIZ  09/30/2021   PACEMAKER GENERATOR CHANGE N/A 09/25/2014   BTK dual chamber pacemkaer implanted by Dr Rayann Heman   PACEMAKER INSERTION  07/12/2006   [employer] Victory XL DR  -  Dr. Tami Ribas   PARTIAL HYSTERECTOMY     RADIOLOGY WITH ANESTHESIA N/A 09/30/2021   Procedure:  Arteriogram, Onyx embolization of right middle meningeal artery;  Surgeon: Consuella Lose, MD;  Location: [facility];  Service: Radiology;  Laterality: N/A;   VENTRICULOPERITONEAL SHUNT  11/13/2001   Dr. Erline Levine   Current Outpatient Medications  Medication Instructions   Artificial Tear Ointment (DRY EYES OP) 1 drop, Both Eyes, Daily PRN   Blood Glucose Monitoring Suppl (ONE TOUCH ULTRA 2) w/Device KIT USE UP TO FOUR TIMES DAILY AS DIRECTED   buPROPion (WELLBUTRIN SR) 150 mg, Oral, Daily   clonazePAM (KLONOPIN) 1 MG tablet TAKE 1 TABLET(1 MG) BY MOUTH THREE TIMES DAILY AS NEEDED   losartan (COZAAR) 100 MG tablet TAKE 1/2 TABLET BY MOUTH IN THE MORNING AND IN THE EVENING   metFORMIN (GLUCOPHAGE) 500 MG tablet TAKE 1 TABLET BY MOUTH WITH BREAKFAST AND WITH SUPPER FOR DIABETES   pantoprazole (PROTONIX) 40 MG tablet TAKE 1 TABLET(40 MG) BY MOUTH DAILY   traZODone (DESYREL) 50 MG tablet To start 25 mg by mouth at bedtime for 2 weeks then increase to 50 mg by mouth at bedtime   She reports she does not take the Wellbutrin   Family History  Problem Relation Age of Onset   Cancer Mother    Heart attack Father    Heart attack Son        Heart Pump placed    Diabetes Son    Hypertension Other     Social History:  reports that she has been smoking cigarettes. She started smoking about 3 years ago. She has never used smokeless tobacco. She reports that she does not drink alcohol and does not use drugs.   Exam: Current vital signs: BP (!) 168/94   Pulse 80   Temp 97.9 F (36.6 C) (Oral)   Resp 17   Ht _0  (1.803 m)   Wt 61 kg   LMP  (LMP Unknown)   SpO2 97%   BMI 18.76 kg/m  Vital signs in last 24 hours: Temp:  [97.8 F (36.6 C)-97.9 F (36.6 C)] 97.9 F (36.6 C) (11/09 0043) Pulse Rate:  [72-80] 80 (11/09 0330) Resp:  [12-18] 17 (11/09 0330) BP: (140-168)/(73-120) 168/94 (11/09 0330) SpO2:  [96 %-98 %] 97 % (11/09 0330) Weight:  [61 kg] 61 kg (11/08 2030)   Physical  Exam  Constitutional: Appears well-developed and well-nourished.  Psych: Affect appropriate to situation, calm and cooperative Eyes: No scleral injection HENT: No oropharyngeal obstruction.  MSK: no joint deformities.  Cardiovascular: Normal rate and regular rhythm. Perfusing extremities well Respiratory: Effort normal, non-labored breathing GI: Soft.  No distension. There is no tenderness.  Skin: Warm dry and intact visible skin  Neuro: Mental Status: Patient is awake, alert, oriented to person, place, month, year, and situation. Patient is able to give a clear and coherent history. No signs of aphasia or neglect Cranial Nerves: II: Visual Fields are full. Pupils are equal, round, and reactive to light.   III,IV,  VI: EOMI without ptosis or diploplia.  V: Facial sensation is symmetric to light touch VII: Facial movement is symmetric.  VIII: hearing is intact to voice X: Uvula elevates symmetrically XI: Shoulder shrug is symmetric. XII: tongue is midline without atrophy or fasciculations.  Motor: Tone is normal. Bulk is normal. 5/5 strength was present in all four extremities except slight bilateral hip flexor weakness 4+/5.  Sensory: Sensation is symmetric to light touch in the arms and legs. Deep Tendon Reflexes: 2+ and symmetric in the brachioradialis and patellae.  Cerebellar: FNF and HKS are intact bilaterally Gait:  Deferred in acute setting    I have reviewed labs in epic and the results pertinent to this consultation are:  Basic Metabolic Panel: Recent Labs  Lab 09/20/22 2228 09/21/22 0207  NA 137  --   K 4.0  --   CL 106  --   CO2 25  --   GLUCOSE 158*  --   BUN 9  --   CREATININE 0.73  --   CALCIUM 9.0  --   MG  --  1.8  PHOS  --  2.8    CBC: Recent Labs  Lab 09/20/22 2228  WBC 8.2  HGB 12.2  HCT 36.1  MCV 91.9  PLT 224    Coagulation Studies: No results for input(s): "LABPROT", "INR" in the last 72 hours.    I have reviewed the images  obtained:  Head CT personally reviewed, agree with radiology Indwelling left frontal approach ventriculostomy catheter with stable ventricular size. Stable bilateral chronic low-density subdural collections measuring 7 mm on the right and 5 mm on the left.    Impression: Given description of the event, I am concerned for a focal seizure.  Given her daily Klonopin use and having been out of medication for several days she is also at risk for withdrawal seizures.  She is certainly at risk for seizures secondary to her prior intracranial hemorrhage as well as her subdurals.  The clonazepam she is already taking for dry eye likely provides her adequate protection given it is at an anticonvulsant dose.  I recommend continuing to take it but scheduling it 3 times a day instead of taking it as needed and monitoring for medication adherence given she ran out of pills early.  She does not take opiate medication at home and therefore I have discontinued opiates to avoid polypharmacy/sedation  Recommendations: -Schedule clonazepam 1 mg 3 times daily -Adequate supervision to confirm medication adherence -Routine EEG  -Confirm with family patient is not taking Wellbutrin, recommend avoiding this medication given that it lowers seizure threshold -Appreciate work-up and management of GI complaints per primary team -Opiate as needed medication discontinued at this time -No need for permissive hypertension at this time, goal normotension -Neurology will follow-up EEG but otherwise will be available as needed going forward  [place] [phone] Available 7 PM to 7 AM, outside of these hours please call Neurologist on call as listed on Amion.

## 2022-09-21 NOTE — Assessment & Plan Note (Signed)
Obtain gastric studies this has been recurrent issue will need to have follow-up with GI no abdominal pain

## 2022-09-21 NOTE — Assessment & Plan Note (Signed)
Allow permissive hypertension for tonight 

## 2022-09-21 NOTE — Assessment & Plan Note (Signed)
Unclear etiology whether this was a syncopal episode versus seizure episode. Cycle cardiac enzymes obtain echogram Pacemaker has been interrogated Appreciate neurology consult If echogram abnormal or troponins abnormal would need cardiology consult

## 2022-09-21 NOTE — ED Notes (Signed)
B[employer]ron[employer] rep reports no events on pacemaker since 10/24.

## 2022-09-21 NOTE — Assessment & Plan Note (Signed)
Chronic-stable.

## 2022-09-21 NOTE — ED Notes (Signed)
EEG tech at bedside. 

## 2022-09-21 NOTE — Progress Notes (Signed)
  Echocardiogram 2D Echocardiogram has been performed.  Darlina Sicilian M 09/21/2022, 10:22 AM

## 2022-09-21 NOTE — Assessment & Plan Note (Signed)
Not a candidate for MRI

## 2022-09-21 NOTE — Assessment & Plan Note (Signed)
Order sliding scale hold p.o. medications 

## 2022-09-21 NOTE — Progress Notes (Signed)
SLP Cancellation Note  Patient Details Name: Mary Higgins MRN: [id number] DOB: 30-May-1941   Cancelled treatment:       Reason Eval/Treat Not Completed: Patient at procedure or test/unavailable; pt actively being discharged (being placed in wheelchair when attempted BSE); family denies dysphagia.  ST will s/o at this time.   Elvina Sidle, M.S., CCC-SLP 09/21/2022, 11:08 AM

## 2022-09-21 NOTE — Procedures (Signed)
Patient Name: Mary Higgins  MRN: [id number]  Epilepsy Attending: Lora Havens  Referring Physician/Provider: Toy Baker, MD  Date: 09/21/2022 Duration: 22.46 mins  Patient history: 81 year old female with history of cerebral aneurysm coiling in 2003, revision, left-sided facet calcified meningioma, subdural hematoma status post right-sided craniotomy in 2022, 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  EEG to evaluate for seizure.  Level of alertness: Awake  AEDs during EEG study: Clonazepam  Technical aspects: This EEG study was done with scalp electrodes positioned according to the 10-20 International system of electrode placement. Electrical activity was reviewed with band pass filter of 1-'70Hz'$ , sensitivity of 7 uV/mm, display speed of 70m/sec with a '60Hz'$  notched filter applied as appropriate. EEG data were recorded continuously and digitally stored.  Video monitoring was available and reviewed as appropriate.  Description: The posterior dominant rhythm consists of 8 Hz activity of moderate voltage (25-35 uV) seen predominantly in posterior head regions, symmetric and reactive to eye opening and eye closing. Hyperventilation and photic stimulation were not performed.     Of note, parts of study were technically difficult due to significant myogenic artifact.  IMPRESSION: This technically difficult study is within normal limits. No seizures or epileptiform discharges were seen throughout the recording.  Mary Higgins

## 2022-09-21 NOTE — ED Notes (Signed)
Pt verbalizes understanding of discharge instructions. Opportunity for questions and answers were provided. Pt discharged from the ED.   ?

## 2022-09-21 NOTE — Discharge Summary (Signed)
Physician Discharge Summary   Patient: Mary Higgins MRN: [id number] DOB: 06-10-1941  Admit date:     09/20/2022  Discharge date: 09/21/22  Discharge Physician: Murray Hodgkins   PCP: [name], NP   Recommendations at discharge:  Syncopal event, seizure-like activity, concern for focal seizure.  Given chronic Klonopin use, missed doses, also at risk for withdrawal seizures.  Also at risk for seizure secondary to prior intracranial hemorrhage and subdurals. --Schedule clonazepam 1 mg 3 times daily, prescription sent to pharmacy.  Discussed with patient the need to carefully monitor this medication, prevent diversion and prevent loss.  She understands no refills have been provided and that she needs to closely follow-up with neurology and her PCP to take over care of this controlled substance. -Adequate supervision to confirm medication adherence -- Follow-up with neurology 8 weeks --seizure precautions   Diarrhea -- Chronic diarrhea with weight loss.  Recommend outpatient referral to gastroenterology for further evaluation.   Discharge Diagnoses: Principal Problem:   Syncope Active Problems:   Primary hypertension   Type 2 diabetes mellitus with hyperglycemia, without long-term current use of insulin (HCC)   Hyperlipidemia   Diarrhea   S/P coil embolization of cerebral aneurysm  Resolved Problems:   * No resolved hospital problems. *  Hospital Course: 81 year old woman PMH including previous subarachnoid hemorrhage, subdural hematoma, cerebral aneurysm status post IR coil embolization, VP shunt placement, pacemaker who was brought from home for right arm shaking and unresponsiveness.  Further history suggested syncopal episode with seizure-like activity afterwards.  Seen by neurology, EEG reassuring, no further neurology work-up recommended, cleared for discharge.  Ambulatory referral to Dr. April Manson in 8 weeks.  Seen October 31 for strokelike symptoms, slurred speech,  aphasia.  Symptoms resolved.  EDP discussed with neurology who did not recommend further evaluation but rather outpatient follow-up.  No seizure activity noted.  Syncopal event, seizure-like activity, concern for focal seizure.  Given chronic Klonopin use, missed doses, also at risk for withdrawal seizures.  Also at risk for seizure secondary to prior intracranial hemorrhage and subdurals. -- Work-up reassuring including head imaging, EEG and laboratory work-up.  Echocardiogram is pending.  Do not expect to see significant abnormalities.  Per neurology the clonazepam she is taking for dry eyes/blepharospasm likely provides adequate protection as it has had an anticonvulsant dose.  Neurology recommended scheduling 3 times a day instead of as needed and monitoring for medication adherence.  No opiates.   --Schedule clonazepam 1 mg 3 times daily, prescription sent to pharmacy.  Discussed with patient the need to carefully monitor this medication, prevent diversion and prevent loss.  She understands no refills have been provided and that she needs to closely follow-up with neurology and her PCP to take over care of this controlled substance. -Adequate supervision to confirm medication adherence -- Patient does not take Wellbutrin -- Follow-up with neurology 8 weeks --seizure precautions Per [facility] statutes, patients with seizures are not allowed to drive until they have been seizure-free for six months.  Use caution when using heavy equipment or power tools. Avoid working on ladders or at heights. Take showers instead of baths. Ensure the water temperature is not too high on the home water heater. Do not go swimming alone. Do not lock yourself in a room alone (i.e. bathroom). When caring for infants or small children, sit down when holding, feeding, or changing them to minimize risk of injury to the child in the event you have a seizure. Maintain good sleep  hygiene. Avoid alcohol.  If patient has  another seizure, call 911 and bring them back to the ED if: A.  The seizure lasts longer than 5 minutes.      B.  The patient doesn't wake shortly after the seizure or has new problems such as difficulty seeing, speaking or moving following the seizure C.  The patient was injured during the seizure D.  The patient has a temperature over 102 F (39C) E.  The patient vomited during the seizure and now is having trouble breathing    Hyperlipidemia Chronic stable   Diarrhea -- Chronic diarrhea with weight loss.  Recommend outpatient referral to gastroenterology for further evaluation.   Essential hypertension --Stable   Type 2 diabetes mellitus with hyperglycemia, without long-term current use of insulin (HCC) --Stable   S/P coil embolization of cerebral aneurysm Not a candidate for MRI     Pain control - [place] Controlled Substance Reporting System database was reviewed. and patient was instructed, not to drive, operate heavy machinery, perform activities at heights, swimming or participation in water activities or provide baby-sitting services while on Pain, Sleep and Anxiety Medications; until their outpatient Physician has advised to do so again. Also recommended to not to take more than prescribed Pain, Sleep and Anxiety Medications.   Consultants:  Neurology   Procedures performed:  EEG   IMPRESSION: This technically difficult study is within normal limits. No seizures or epileptiform discharges were seen throughout the recording.  Disposition: Home Diet recommendation:  Regular diet DISCHARGE MEDICATION: Allergies as of 09/21/2022       Reactions   Oxaprozin Rash        Medication List     STOP taking these medications    buPROPion 150 MG 12 hr tablet Commonly known as: WELLBUTRIN SR       TAKE these medications    clonazePAM 1 MG tablet Commonly known as: KLONOPIN Take 1 tablet (1 mg total) by mouth in the morning, at noon, and at bedtime. What  changed:  how much to take how to take this when to take this additional instructions   DRY EYES OP Place 1 drop into both eyes daily as needed (dry eyes).   losartan 100 MG tablet Commonly known as: COZAAR TAKE 1/2 TABLET BY MOUTH IN THE MORNING AND IN THE EVENING What changed: See the new instructions.   metFORMIN 500 MG tablet Commonly known as: GLUCOPHAGE TAKE 1 TABLET BY MOUTH WITH BREAKFAST AND WITH SUPPER FOR DIABETES What changed: See the new instructions.   ONE TOUCH ULTRA 2 w/Device Kit USE UP TO FOUR TIMES DAILY AS DIRECTED   pantoprazole 40 MG tablet Commonly known as: PROTONIX TAKE 1 TABLET(40 MG) BY MOUTH DAILY What changed: See the new instructions.   traZODone 50 MG tablet Commonly known as: DESYREL To start 25 mg by mouth at bedtime for 2 weeks then increase to 50 mg by mouth at bedtime What changed:  how much to take how to take this when to take this        Follow-up Information     [name], NP. Schedule an appointment as soon as possible for a visit in 1 week(s).   Specialty: Geriatric Medicine Contact information: [address] [place] [address] [phone]         Alric Ran, MD Follow up.   Specialty: Neurology Why: Office will call you with an appointment Contact information: [place] [address] [phone]  Feels  good No complaints No seizures No neuro complaints  Discharge Exam: Filed Weights   09/20/22 2030  Weight: 61 kg   Physical Exam Vitals reviewed.  Constitutional:      General: She is not in acute distress.    Appearance: She is not ill-appearing or toxic-appearing.  Cardiovascular:     Rate and Rhythm: Normal rate and regular rhythm.     Heart sounds: No murmur heard. Pulmonary:     Effort: Pulmonary effort is normal. No respiratory distress.     Breath sounds: No wheezing, rhonchi or rales.  Neurological:     General: No focal  deficit present.     Mental Status: She is alert.  Psychiatric:        Mood and Affect: Mood normal.        Behavior: Behavior normal.      Condition at discharge: good  The results of significant diagnostics from this hospitalization (including imaging, microbiology, ancillary and laboratory) are listed below for reference.   Imaging Studies: ECHOCARDIOGRAM COMPLETE  Result Date: 09/21/2022    ECHOCARDIOGRAM REPORT   Patient Name:   Mary Higgins Date of Exam: 09/21/2022 Medical Rec #:  [id number]          Height:       71.0 in Accession #:    [id number]         Weight:       134.5 lb Date of Birth:  1941/04/22          BSA:          1.781 m Patient Age:    8 years           BP:           151/80 mmHg Patient Gender: F                  HR:           92 bpm. Exam Location:  Inpatient Procedure: 2D Echo, Cardiac Doppler and Color Doppler Indications:    Syncope R55  History:        Patient has prior history of Echocardiogram examinations, most                 recent 03/31/2013.  Sonographer:    Darlina Sicilian RDCS Referring Phys: [place]  Sonographer Comments: PLAX measurements not accurate IMPRESSIONS  1. Abnormal septal motion. Left ventricular ejection fraction, by estimation, is 60 to 65%. The left ventricle has normal function. The left ventricle has no regional wall motion abnormalities. Left ventricular diastolic parameters are consistent with Grade I diastolic dysfunction (impaired relaxation).  2. Right ventricular systolic function is normal. The right ventricular size is normal. There is normal pulmonary artery systolic pressure.  3. The mitral valve is abnormal. Mild mitral valve regurgitation. No evidence of mitral stenosis. Moderate mitral annular calcification.  4. The aortic valve is tricuspid. There is mild calcification of the aortic valve. There is mild thickening of the aortic valve. Aortic valve regurgitation is not visualized. Aortic valve sclerosis is present,  with no evidence of aortic valve stenosis.  5. The inferior vena cava is normal in size with greater than 50% respiratory variability, suggesting right atrial pressure of 3 mmHg. FINDINGS  Left Ventricle: Abnormal septal motion. Left ventricular ejection fraction, by estimation, is 60 to 65%. The left ventricle has normal function. The left ventricle has no regional wall motion abnormalities. The left ventricular internal cavity size was normal  in size. There is no left ventricular hypertrophy. Left ventricular diastolic parameters are consistent with Grade I diastolic dysfunction (impaired relaxation). Right Ventricle: The right ventricular size is normal. No increase in right ventricular wall thickness. Right ventricular systolic function is normal. There is normal pulmonary artery systolic pressure. The tricuspid regurgitant velocity is 2.52 m/s, and  with an assumed right atrial pressure of 8 mmHg, the estimated right ventricular systolic pressure is 10.2 mmHg. Left Atrium: Left atrial size was normal in size. Right Atrium: Right atrial size was normal in size. Pericardium: There is no evidence of pericardial effusion. Mitral Valve: The mitral valve is abnormal. There is mild thickening of the mitral valve leaflet(s). There is mild calcification of the mitral valve leaflet(s). Moderate mitral annular calcification. Mild mitral valve regurgitation. No evidence of mitral  valve stenosis. Tricuspid Valve: The tricuspid valve is normal in structure. Tricuspid valve regurgitation is not demonstrated. No evidence of tricuspid stenosis. Aortic Valve: The aortic valve is tricuspid. There is mild calcification of the aortic valve. There is mild thickening of the aortic valve. Aortic valve regurgitation is not visualized. Aortic valve sclerosis is present, with no evidence of aortic valve stenosis. Pulmonic Valve: The pulmonic valve was normal in structure. Pulmonic valve regurgitation is not visualized. No evidence of  pulmonic stenosis. Aorta: The aortic root is normal in size and structure. Venous: The inferior vena cava is normal in size with greater than 50% respiratory variability, suggesting right atrial pressure of 3 mmHg. IAS/Shunts: No atrial level shunt detected by color flow Doppler.  LEFT VENTRICLE PLAX 2D LVIDd:         3.63 cm   Diastology LVIDs:         2.01 cm   LV e' medial:    3.48 cm/s LV PW:         0.86 cm   LV E/e' medial:  18.9 LV IVS:        0.81 cm   LV e' lateral:   7.62 cm/s LVOT diam:     2.20 cm   LV E/e' lateral: 8.6 LV SV:         76 LV SV Index:   42 LVOT Area:     3.80 cm                           3D Volume EF:                          3D EF:        53 %                          LV EDV:       133 ml                          LV ESV:       63 ml                          LV SV:        70 ml RIGHT VENTRICLE RV S prime:     9.90 cm/s TAPSE (M-mode): 3.0 cm LEFT ATRIUM             Index LA Vol (A2C):   27.5 ml 15.44 ml/m LA Vol (A4C):   29.1  ml 16.34 ml/m LA Biplane Vol: 28.0 ml 15.72 ml/m  AORTIC VALVE LVOT Vmax:   111.14 cm/s LVOT Vmean:  68.532 cm/s LVOT VTI:    0.199 m  AORTA Ao Root diam: 3.60 cm MITRAL VALVE                TRICUSPID VALVE MV Area (PHT): 3.08 cm     TR Peak grad:   25.4 mmHg MV Decel Time: 246 msec     TR Vmax:        252.00 cm/s MV E velocity: 65.60 cm/s MV A velocity: 135.00 cm/s  SHUNTS MV E/A ratio:  0.49         Systemic VTI:  0.20 m                             Systemic Diam: 2.20 cm Jenkins Rouge MD Electronically signed by Jenkins Rouge MD Signature Date/Time: 09/21/2022/10:46:30 AM    Final    EEG adult  Result Date: 09/21/2022 Lora Havens, MD     09/21/2022  7:40 AM Patient Name: Mary Higgins MRN: [id number] Epilepsy Attending: Lora Havens Referring Physician/Provider: Toy Baker, MD Date: 09/21/2022 Duration: 22.46 mins Patient history: 81 year old female with history of cerebral aneurysm coiling in 2003, revision, left-sided facet calcified  meningioma, subdural hematoma status post right-sided craniotomy in 2022, 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  EEG to evaluate for seizure. Level of alertness: Awake AEDs during EEG study: Clonazepam Technical aspects: This EEG study was done with scalp electrodes positioned according to the 10-20 International system of electrode placement. Electrical activity was reviewed with band pass filter of 1-_0 , sensitivity of 7 uV/mm, display speed of 7m/sec with a _1  notched filter applied as appropriate. EEG data were recorded continuously and digitally stored.  Video monitoring was available and reviewed as appropriate. Description: The posterior dominant rhythm consists of 8 Hz activity of moderate voltage (25-35 uV) seen predominantly in posterior head regions, symmetric and reactive to eye opening and eye closing. Hyperventilation and photic stimulation were not performed.   Of note, parts of study were technically difficult due to significant myogenic artifact. IMPRESSION: This technically difficult study is within normal limits. No seizures or epileptiform discharges were seen throughout the recording. PLora Havens  DG Chest 2 View  Result Date: 09/21/2022 CLINICAL DATA:  Syncope EXAM: CHEST - 2 VIEW COMPARISON:  08/04/2021 FINDINGS: Right chest cardiac device with unchanged lead position. Cardiac and mediastinal contours are within normal limits. No focal pulmonary opacity. No pleural effusion or pneumothorax. No acute osseous abnormality. Surgical clips in the bilateral axillae and right upper quadrant. IMPRESSION: No acute cardiopulmonary process. Electronically Signed   By: AMerilyn BabaM.D.   On: 09/21/2022 00:39   CT HEAD WO CONTRAST  Result Date: 09/20/2022 CLINICAL DATA:  Syncope, VP shunt EXAM: CT HEAD WITHOUT CONTRAST TECHNIQUE: Contiguous axial images were obtained from the base of the skull through the  vertex without intravenous contrast. RADIATION DOSE REDUCTION: This exam was performed according to the departmental dose-optimization program which includes automated exposure control, adjustment of the mA and/or kV according to patient size and/or use of iterative reconstruction technique. COMPARISON:  CTA head dated 09/12/2022 FINDINGS: Brain: No evidence of acute infarction, hemorrhage, hydrocephalus, or mass lesion/mass effect. Bilateral chronic low-density subdural collections measuring 7 mm on the right and 5 mm on the  left (coronal image 35), grossly unchanged. Indwelling left frontal approach ventriculostomy catheter. Ventricular size is unchanged. Subcortical white matter and periventricular small vessel ischemic changes. Vascular: Intracranial atherosclerosis. Prior left ACA aneurysm coil. Skull: Prior right frontal burr hole and postsurgical changes related to prior right frontal ventriculostomy catheter. Negative for fracture or focal lesion. Sinuses/Orbits: The visualized paranasal sinuses are essentially clear. The mastoid air cells are unopacified. Other: None. IMPRESSION: Indwelling left frontal approach ventriculostomy catheter with stable ventricular size. Stable bilateral chronic low-density subdural collections measuring 7 mm on the right and 5 mm on the left. Additional stable ancillary findings as above. Electronically Signed   By: Julian Hy M.D.   On: 09/20/2022 23:48   CT Angio Head W or Wo Contrast  Result Date: 09/12/2022 CLINICAL DATA:  Previously treated aneurysm. Recent aphasia and headache. History of subdural hematoma. EXAM: CT ANGIOGRAPHY HEAD TECHNIQUE: Multidetector CT imaging of the head was performed using the standard protocol during bolus administration of intravenous contrast. Multiplanar CT image reconstructions and MIPs were obtained to evaluate the vascular anatomy. RADIATION DOSE REDUCTION: This exam was performed according to the departmental  dose-optimization program which includes automated exposure control, adjustment of the mA and/or kV according to patient size and/or use of iterative reconstruction technique. CONTRAST:  47m OMNIPAQUE IOHEXOL 350 MG/ML SOLN COMPARISON:  CT head most recently 08/26/2021, catheter angiogram 09/30/2021 FINDINGS: CT HEAD Brain: There are bilateral hypodense subdural collections overlying the cerebral hemispheres measuring up to 6 mm in maximal thickness in the coronal plane bilaterally, significantly decreased on the right since 08/26/2021, and not significantly changed on the left. There is no definite hyperdense material within the collections to suggest acute bleed. There is a left frontal ventricular catheter in place terminating in the body of the left lateral ventricle. The ventricles are similar in size and configuration compared to the study of 08/26/2021, with the exception of re-expansion of the right lateral ventricle due to the decreased size of the right cerebral convexity subdural hematoma. There is patchy hypodensity in the supratentorial white matter likely reflecting sequela of underlying chronic small vessel ischemic change. There is no evidence of acute infarct A probable calcified meningioma along the left aspect of the falx posteriorly is unchanged. There is no new solid mass lesion. There is no mass effect or midline shift. Vascular: There has been previous right middle meningeal artery coil embolization for treatment of the chronic subdural hematoma. The arterial vasculature is assessed in full below. Skull: There is a left frontal ventricular catheter as well as a prior right parietal burr hole. There is no acute osseous abnormality. Sinuses: The imaged paranasal sinuses are clear. Bilateral lens implants are in place. The globes and orbits are otherwise unremarkable. Other: None. CTA HEAD Anterior circulation: The imaged portions of the cervical ICAs are patent. There is mild calcified plaque  in the intracranial ICAs without significant stenosis or occlusion. The bilateral MCAs are patent, without significant stenosis or occlusion. Streak artifact from aneurysm coil material obscures the ACAs in the region of the anterior communicating artery. Within this confine, the ACAs appear patent, without significant stenosis or occlusion. There is no definite recurrent filling of the treated anterior communicating artery aneurysm. There is no new aneurysm. There is no AVM. Posterior circulation: The bilateral V4 segments are patent. The basilar artery is patent. The major cerebellar arteries are patent. The bilateral PCAs are patent. Bilateral posterior communicating arteries are identified with a primarily fetal origin of the right PCA. There is  no significant stenosis or occlusion. There is no aneurysm or AVM. Venous sinuses: As permitted by contrast timing, patent. Anatomic variants: As above. Review of the MIP images confirms the above findings. IMPRESSION: 1. 6 mm thick bilateral cerebral convexity hypodense subdural collections consistent with chronic subdural hematomas, significantly decreased in size on the right and not significantly changed on the left since 08/26/2021. No hyperdense material within the collections to suggest superimposed acute bleed. 2. Left frontal ventricular catheter in place without evidence of hydrocephalus. 3. Previous coil embolization of an anterior communicating artery without definite filling of the treated aneurysm. No new aneurysm. 4. Patent intracranial vasculature with no significant stenosis or occlusion. Electronically Signed   By: Valetta Mole M.D.   On: 09/12/2022 17:40   CUP PACEART REMOTE DEVICE CHECK  Result Date: 08/31/2022 Scheduled remote reviewed. Normal device function.  Next remote 91 days. Kathy Breach, RN, CCDS, [employer]   Microbiology: Results for orders placed or performed during the hospital encounter of 09/30/21  SARS Coronavirus 2  by RT PCR (hospital order, performed in [facility] hospital lab) Nasopharyngeal Nasopharyngeal Swab     Status: None   Collection Time: 09/30/21 11:28 AM   Specimen: Nasopharyngeal Swab  Result Value Ref Range Status   SARS Coronavirus 2 NEGATIVE NEGATIVE Final    Comment: (NOTE) SARS-CoV-2 target nucleic acids are NOT DETECTED.  The SARS-CoV-2 RNA is generally detectable in upper and lower respiratory specimens during the acute phase of infection. The lowest concentration of SARS-CoV-2 viral copies this assay can detect is 250 copies / mL. A negative result does not preclude SARS-CoV-2 infection and should not be used as the sole basis for treatment or other patient management decisions.  A negative result may occur with improper specimen collection / handling, submission of specimen other than nasopharyngeal swab, presence of viral mutation(s) within the areas targeted by this assay, and inadequate number of viral copies (<250 copies / mL). A negative result must be combined with clinical observations, patient history, and epidemiological information.  Fact Sheet for Patients:   StrictlyIdeas.no  Fact Sheet for Healthcare Providers: BankingDealers.co.za  This test is not yet approved or  cleared by the [place] FDA and has been authorized for detection and/or diagnosis of SARS-CoV-2 by FDA under an Emergency Use Authorization (EUA).  This EUA will remain in effect (meaning this test can be used) for the duration of the COVID-19 declaration under Section 564(b)(1) of the Act, 21 U.S.C. section 360bbb-3(b)(1), unless the authorization is terminated or revoked sooner.  Performed at [facility], [place] [address] [place], [place] [address]   MRSA Next Gen by PCR, Nasal     Status: None   Collection Time: 09/30/21  8:58 PM   Specimen: Nasal Mucosa; Nasal Swab  Result Value Ref Range Status   MRSA by PCR Next Gen NOT  DETECTED NOT DETECTED Final    Comment: (NOTE) The GeneXpert MRSA Assay (FDA approved for NASAL specimens only), is one component of a comprehensive MRSA colonization surveillance program. It is not intended to diagnose MRSA infection nor to guide or monitor treatment for MRSA infections. Test performance is not FDA approved in patients less than 56 years old. Performed at [facility], [place] [address] [place], [place] [address]     Labs: CBC: Recent Labs  Lab 09/20/22 2228 09/21/22 0407  WBC 8.2 8.1  NEUTROABS  --  4.5  HGB 12.2 12.2  HCT 36.1 36.8  MCV  91.9 91.8  PLT 224 102   Basic Metabolic Panel: Recent Labs  Lab 09/20/22 2228 09/21/22 0207 09/21/22 0407  NA 137  --  136  K 4.0  --  3.3*  CL 106  --  102  CO2 25  --  24  GLUCOSE 158*  --  102*  BUN 9  --  7*  CREATININE 0.73  --  0.66  CALCIUM 9.0  --  9.0  MG  --  1.8 2.0  PHOS  --  2.8 3.0   Liver Function Tests: Recent Labs  Lab 09/20/22 2228 09/21/22 0407  AST 10* 13*  ALT 6 10  ALKPHOS 32* 34*  BILITOT 1.1 1.0  PROT 5.8* 6.3*  ALBUMIN 3.6 3.9   CBG: Recent Labs  Lab 09/20/22 2016 09/21/22 0042 09/21/22 0409 09/21/22 0812 09/21/22 1011  GLUCAP 173* 137* 100* 151* 175*    Discharge time spent: greater than 30 minutes.  Signed: Murray Hodgkins, MD [facility] 09/21/2022

## 2022-09-26 ENCOUNTER — Encounter: Payer: Medicare Other | Admitting: Family

## 2022-09-28 ENCOUNTER — Telehealth: Payer: Self-pay

## 2022-09-28 NOTE — Telephone Encounter (Signed)
     Patient  visit on 09/12/2022  at [facility] was for Unspecified speech disturbances.  Have you been able to follow up with your primary care physician? Patient has not been able to get an appointment with her PCP, no appointments available.  The patient was or was not able to obtain any needed medicine or equipment. No medications prescribed.  Are there diet recommendations that you are having difficulty following? No  Patient expresses understanding of discharge instructions and education provided has no other needs at this time.    [place] Resource Care Guide   [email or web address]  ?? [phone]   Website: [email or web address]  [email or web address]

## 2022-10-02 ENCOUNTER — Telehealth: Payer: Self-pay

## 2022-10-02 NOTE — Telephone Encounter (Signed)
Received a mode switch alert from [employer] today.  Alert showed brief 4 second run of a 1:1 SVT only.   Patient has not been seen in the office in several years. She needs appointment.   Will forward to [facility] to get patient scheduled for follow up.

## 2022-10-06 NOTE — Progress Notes (Signed)
  This encounter was created in error - please disregard. No show 

## 2022-10-24 ENCOUNTER — Encounter: Payer: Self-pay | Admitting: Cardiovascular Disease

## 2022-11-01 ENCOUNTER — Other Ambulatory Visit: Payer: Self-pay | Admitting: Nurse Practitioner

## 2022-11-01 DIAGNOSIS — G245 Blepharospasm: Secondary | ICD-10-CM

## 2022-11-14 ENCOUNTER — Encounter: Payer: Self-pay | Admitting: Nurse Practitioner

## 2022-11-14 ENCOUNTER — Encounter: Payer: Medicare Other | Admitting: Nurse Practitioner

## 2022-11-14 ENCOUNTER — Telehealth: Payer: Self-pay

## 2022-11-14 NOTE — Progress Notes (Signed)
This encounter was created in error - please disregard.

## 2022-11-14 NOTE — Telephone Encounter (Signed)
3 unsuccessful attempts were made to patient to complete her Annual Wellness Visit via video visit. I was unable to leave a message due to no voicemail pick-up.   Patients visit was un-arrived and the administrative staff will follow the no show policy in attempt to get patient rescheduled.

## 2022-11-14 NOTE — Progress Notes (Signed)
   This service is provided via telemedicine  No vital signs collected/recorded due to the encounter was a telemedicine visit.   Location of patient (ex: home, work):  Home  Patient consents to a telephone visit: Yes, see telephone visit dated 11/14/22  Location of the provider (ex: office, home):  [place], [place] Location   Name of any referring provider:  N/A  Names of all persons participating in the telemedicine service and their role in the encounter:  S.Chrae B/CMA, Sherrie Mustache, NP, and Patient   Time spent on call:  9 min with medical assistant

## 2022-11-16 ENCOUNTER — Encounter: Payer: Self-pay | Admitting: Nurse Practitioner

## 2022-11-17 ENCOUNTER — Encounter: Payer: Medicare Other | Admitting: Nurse Practitioner

## 2022-11-17 NOTE — Progress Notes (Signed)
This encounter was created in error - please disregard.

## 2022-11-20 ENCOUNTER — Other Ambulatory Visit: Payer: Self-pay | Admitting: Nurse Practitioner

## 2022-11-20 ENCOUNTER — Ambulatory Visit: Payer: Medicare Other | Admitting: Neurology

## 2022-11-27 ENCOUNTER — Other Ambulatory Visit: Payer: Self-pay | Admitting: Nurse Practitioner

## 2022-11-27 DIAGNOSIS — G4700 Insomnia, unspecified: Secondary | ICD-10-CM

## 2022-11-27 DIAGNOSIS — F324 Major depressive disorder, single episode, in partial remission: Secondary | ICD-10-CM

## 2022-11-30 ENCOUNTER — Ambulatory Visit: Payer: Medicare HMO | Attending: Cardiovascular Disease

## 2022-11-30 DIAGNOSIS — I495 Sick sinus syndrome: Secondary | ICD-10-CM

## 2022-11-30 LAB — CUP PACEART REMOTE DEVICE CHECK
Battery Remaining Percentage: 50 %
Brady Statistic RA Percent Paced: 47 %
Brady Statistic RV Percent Paced: 0 %
Date Time Interrogation Session: 20240118071017
Implantable Lead Connection Status: 753985
Implantable Lead Connection Status: 753985
Implantable Lead Implant Date: 20070830
Implantable Lead Implant Date: 20070830
Implantable Lead Location: 753859
Implantable Lead Location: 753860
Implantable Pulse Generator Implant Date: 20151113
Lead Channel Impedance Value: 351 Ohm
Lead Channel Impedance Value: 507 Ohm
Lead Channel Sensing Intrinsic Amplitude: 1.6 mV
Lead Channel Sensing Intrinsic Amplitude: 6.1 mV
Lead Channel Setting Pacing Amplitude: 1.5 V
Lead Channel Setting Pacing Amplitude: 3.6 V
Lead Channel Setting Pacing Pulse Width: 0.4 ms
Pulse Gen Model: 394931
Pulse Gen Serial Number: 68408118

## 2022-12-05 ENCOUNTER — Encounter: Payer: Medicare Other | Admitting: Cardiovascular Disease

## 2022-12-18 NOTE — Progress Notes (Signed)
Remote pacemaker transmission.   

## 2022-12-20 ENCOUNTER — Ambulatory Visit: Payer: Medicare Other | Admitting: Neurology

## 2022-12-20 ENCOUNTER — Encounter: Payer: Self-pay | Admitting: Neurology

## 2023-02-05 ENCOUNTER — Encounter: Payer: Self-pay | Admitting: Nurse Practitioner

## 2023-02-05 ENCOUNTER — Ambulatory Visit (INDEPENDENT_AMBULATORY_CARE_PROVIDER_SITE_OTHER): Payer: Medicare HMO | Admitting: Nurse Practitioner

## 2023-02-05 VITALS — BP 126/78 | HR 64 | Temp 97.0°F | Ht 71.0 in | Wt 132.2 lb

## 2023-02-05 DIAGNOSIS — F324 Major depressive disorder, single episode, in partial remission: Secondary | ICD-10-CM

## 2023-02-05 DIAGNOSIS — Z23 Encounter for immunization: Secondary | ICD-10-CM

## 2023-02-05 DIAGNOSIS — R519 Headache, unspecified: Secondary | ICD-10-CM | POA: Diagnosis not present

## 2023-02-05 DIAGNOSIS — H9193 Unspecified hearing loss, bilateral: Secondary | ICD-10-CM

## 2023-02-05 DIAGNOSIS — G245 Blepharospasm: Secondary | ICD-10-CM | POA: Diagnosis not present

## 2023-02-05 DIAGNOSIS — R634 Abnormal weight loss: Secondary | ICD-10-CM

## 2023-02-05 DIAGNOSIS — E1169 Type 2 diabetes mellitus with other specified complication: Secondary | ICD-10-CM

## 2023-02-05 DIAGNOSIS — I10 Essential (primary) hypertension: Secondary | ICD-10-CM | POA: Diagnosis not present

## 2023-02-05 MED ORDER — MIRTAZAPINE 7.5 MG PO TABS: 15.0000 mg | ORAL_TABLET | Freq: Every day | ORAL | 1 refills | Status: DC

## 2023-02-05 NOTE — Progress Notes (Signed)
Careteam: Patient Care Team: [name], NP as PCP - General (Geriatric Medicine) Patsey Berthold, NP (Inactive) as Nurse Practitioner (Cardiology) Darleen Crocker, MD as Consulting Physician (Ophthalmology) Thompson Grayer, MD (Inactive) as Consulting Physician (Cardiology) Otelia Sergeant, OD as Referring Physician (Ophthalmology)  PLACE OF SERVICE:  [place] Directive information Does Patient Have a Medical Advance Directive?: No, Would patient like information on creating a medical advance directive?: No - Patient declined  Allergies  Allergen Reactions   Oxaprozin Rash    Chief Complaint  Patient presents with   Medical Management of Chronic Issues    Medical Management of Chronic Issues. Complains of still having Diarrhea, not constant, thinking it is coming from the Metformin.    Quality Metric Gaps    To discuss need for Diabetic Kidney Eval, Foot exam,Flu Vaccine, Covid Vaccine, Eye Exam and AWV     HPI: Patient is a 82 y.o. female for routine follow up.   Has had to cancel appts due to problem with her vehicle/ride. Daughter brings her in today.   She continues to lose weight. Not eating 3 meals a day or taking any supplements.   GERD- controlled on protonix.   Reports eyes are bad- needing appt with eye doctor. Daughter states she went last year around this time.   Has diarrhea occasionally   Trazodone is not helping sleep.  Has days a nights mixed up.   Does not feel well but does not have any specific symptoms.    Review of Systems:  Review of Systems  Constitutional:  Negative for chills, fever and weight loss.  HENT:  Negative for tinnitus.   Respiratory:  Negative for cough, sputum production and shortness of breath.   Cardiovascular:  Negative for chest pain, palpitations and leg swelling.  Gastrointestinal:  Negative for abdominal pain, constipation, diarrhea and heartburn.  Genitourinary:  Negative for dysuria, frequency and  urgency.  Musculoskeletal:  Negative for back pain, falls, joint pain and myalgias.  Skin: Negative.   Neurological:  Negative for dizziness and headaches.  Psychiatric/Behavioral:  Negative for depression and memory loss. The patient has insomnia.     Past Medical History:  Diagnosis Date   Anxiety and depression    Hx of   Blepharospasm     Breast cancer ([place]) 1987   s/p lumpectomy.   Carotid bruit    Bilateral. Mild to moderate disease on LEFT and mild on the RIGHT in 2012   COPD (chronic obstructive pulmonary disease) (HCC)    mild. Not on inhalers.   Diabetes mellitus without complication ([place])    AODM   Fuchs' corneal dystrophy    GERD (gastroesophageal reflux disease)     Headache    Hyperlipidemia    Hypertension     Insomnia     Intracerebral bleed ([place]) 2003   Remote intracerebral blled and coiling by Dr. Estanislado Pandy for cerebral aneurysm and a shunt placed by Dr. Vertell Limber remotely. No seizure disorder and this happened in 2003 with an anterior communicating aneurysm.   Meningioma ([place])    left parafalcine calcified meningioma   Neurocardiogenic syncope    a. s/p Biotronik dual chamber pacemaker   Pacemaker    Personal history of radiation therapy    S/P VP shunt 2003   Sinus node dysfunction (HCC)     Tobacco abuse     Type II or unspecified type diabetes mellitus without mention of complication, not stated as uncontrolled  Past Surgical History:  Procedure Laterality Date   ANEURYSM COILING  11/13/2001   Cerebral aneurysm ;Dr. Luanne Bras   BREAST LUMPECTOMY Right    For cancer, no recurrence   CEREBRAL ANEURYSM REPAIR  2023   CHOLECYSTECTOMY     CRANIOTOMY Right 08/04/2021   Procedure: BURR HOLES FOR SUBDURAL HEMATOMA;  Surgeon: Judith Part, MD;  Location: [facility];  Service: Neurosurgery;  Laterality: Right;   IR ANGIO INTRA EXTRACRAN SEL INTERNAL CAROTID UNI R MOD SED  09/30/2021   IR ANGIO VERTEBRAL SEL VERTEBRAL UNI R MOD SED  09/30/2021    IR TRANSCATH/EMBOLIZ  09/30/2021   PACEMAKER GENERATOR CHANGE N/A 09/25/2014   BTK dual chamber pacemkaer implanted by Dr Rayann Heman   PACEMAKER INSERTION  07/12/2006   St[employer] Jude Victory XL DR  -  Dr. Tami Ribas   PARTIAL HYSTERECTOMY     RADIOLOGY WITH ANESTHESIA N/A 09/30/2021   Procedure: Arteriogram, Onyx embolization of right middle meningeal artery;  Surgeon: Consuella Lose, MD;  Location: [facility];  Service: Radiology;  Laterality: N/A;   VENTRICULOPERITONEAL SHUNT  11/13/2001   Dr. Erline Levine   Social History:   reports that she has been smoking cigarettes. She started smoking about 4 years ago. She has never used smokeless tobacco. She reports that she does not drink alcohol and does not use drugs.  Family History  Problem Relation Age of Onset   Cancer Mother    Heart attack Father    Heart attack Son        Heart Pump placed    Diabetes Son    Hypertension Other     Medications: Patient's Medications  New Prescriptions   No medications on file  Previous Medications   ARTIFICIAL TEAR OINTMENT (DRY EYES OP)    Place 1 drop into both eyes daily as needed (dry eyes).   BLOOD GLUCOSE MONITORING SUPPL (ONE TOUCH ULTRA 2) W/DEVICE KIT    USE UP TO FOUR TIMES DAILY AS DIRECTED   CLONAZEPAM (KLONOPIN) 1 MG TABLET    TAKE 1 TABLET(1 MG) BY MOUTH THREE TIMES DAILY AS NEEDED   LOSARTAN (COZAAR) 100 MG TABLET    TAKE 1/2 TABLET BY MOUTH IN THE MORNING AND IN THE EVENING   METFORMIN (GLUCOPHAGE) 500 MG TABLET    TAKE 1 TABLET BY MOUTH WITH BREAKFAST AND WITH SUPPER FOR DIABETES   PANTOPRAZOLE (PROTONIX) 40 MG TABLET    TAKE 1 TABLET(40 MG) BY MOUTH DAILY   TRAZODONE (DESYREL) 50 MG TABLET    TAKE 1/2 TABLET BY MOUTH AT BEDTIME FOR 2 WEEKS THEN INCREASE TO 1 TABLET EVERY NIGHT AT BEDTIME  Modified Medications   No medications on file  Discontinued Medications   No medications on file    Physical Exam:  Vitals:   02/05/23 1256  BP: 126/78  Pulse: 64  Temp: (!) 97 F (36.1 C)   SpO2: 96%  Weight: 132 lb 3.2 oz (60 kg)  Height: 5\' 11"  (1.803 m)   Body mass index is 18.44 kg/m. Wt Readings from Last 3 Encounters:  02/05/23 132 lb 3.2 oz (60 kg)  09/20/22 134 lb 7.7 oz (61 kg)  08/02/22 134 lb 6.4 oz (61 kg)    Physical Exam Constitutional:      General: She is not in acute distress.    Appearance: She is well-developed. She is not diaphoretic.  HENT:     Head: Normocephalic and atraumatic.     Right Ear: Tympanic membrane, ear canal and  external ear normal.     Left Ear: Tympanic membrane, ear canal and external ear normal.     Mouth/Throat:     Pharynx: No oropharyngeal exudate.  Eyes:     Conjunctiva/sclera: Conjunctivae normal.     Pupils: Pupils are equal, round, and reactive to light.  Cardiovascular:     Rate and Rhythm: Normal rate and regular rhythm.     Heart sounds: Normal heart sounds.  Pulmonary:     Effort: Pulmonary effort is normal.     Breath sounds: Normal breath sounds.  Abdominal:     General: Bowel sounds are normal.     Palpations: Abdomen is soft.  Musculoskeletal:     Cervical back: Normal range of motion and neck supple.     Right lower leg: No edema.     Left lower leg: No edema.  Skin:    General: Skin is warm and dry.  Neurological:     Mental Status: She is alert and oriented to person, place, and time.     Motor: No weakness.     Gait: Gait normal.  Psychiatric:        Mood and Affect: Mood normal.     Labs reviewed: Basic Metabolic Panel: Recent Labs    03/03/22 1455 08/02/22 1025 08/02/22 1025 09/12/22 1500 09/12/22 1517 09/20/22 2228 09/21/22 0207 09/21/22 0407  NA 139 139  --  137 137 137  --  136  K 3.5 4.6  --  4.2 4.1 4.0  --  3.3*  CL 102 101  --  102 100 106  --  102  CO2 30 28  --  27  --  25  --  24  GLUCOSE 110* 135  --  111* 106* 158*  --  102*  BUN 15 12  --  9 10 9   --  7*  CREATININE 0.74 0.91   < > 0.64 0.60 0.73  --  0.66  CALCIUM 9.1 9.8  --  9.4  --  9.0  --  9.0  MG  --    --   --   --   --   --  1.8 2.0  PHOS  --   --   --   --   --   --  2.8 3.0  TSH 1.07 1.30  --   --   --   --  1.538  --    < > = values in this interval not displayed.   Liver Function Tests: Recent Labs    09/12/22 1500 09/20/22 2228 09/21/22 0407  AST 10* 10* 13*  ALT 8 6 10   ALKPHOS 29* 32* 34*  BILITOT 0.7 1.1 1.0  PROT 5.6* 5.8* 6.3*  ALBUMIN 3.5 3.6 3.9   No results for input(s): "LIPASE", "AMYLASE" in the last 8760 hours. No results for input(s): "AMMONIA" in the last 8760 hours. CBC: Recent Labs    08/02/22 1025 09/12/22 1500 09/12/22 1517 09/20/22 2228 09/21/22 0407  WBC 8.1 4.3  --  8.2 8.1  NEUTROABS 6,156 2.0  --   --  4.5  HGB 12.9 11.9* 12.2 12.2 12.2  HCT 38.4 36.4 36.0 36.1 36.8  MCV 91.4 93.6  --  91.9 91.8  PLT 425* 204  --  224 243   Lipid Panel: No results for input(s): "CHOL", "HDL", "LDLCALC", "TRIG", "CHOLHDL", "LDLDIRECT" in the last 8760 hours. TSH: Recent Labs    03/03/22 1455 08/02/22 1025 09/21/22 0207  TSH 1.07 1.30 1.538  A1C: Lab Results  Component Value Date   HGBA1C 5.9 (H) 08/02/2022     Assessment/Plan 1. Type 2 diabetes mellitus with other specified complication, without long-term current use of insulin (HCC) -Encouraged dietary compliance, routine foot care/monitoring and to keep up with diabetic eye exams through ophthalmology  -if A1c at goal will stop metformin  - Hemoglobin A1c - Microalbumin/Creatinine Ratio, Urine  2. Hypertension, unspecified type -Blood pressure well controlled, goal bp <140/90 Continue current medications and dietary modifications follow metabolic panel - COMPLETE METABOLIC PANEL WITH GFR - CBC with Differential/Platelet  3. Intractable headache, unspecified chronicity pattern, unspecified headache type Neurology referral placed at the last 2 visits. She is having a lot of issues with transportation and follow up for appts. Will place another referral   4. Blepharospasm -continues on  clonazepam TID PRN  5. Weight loss, abnormal -will add remeron to help with appetite stimulate. Encouraged to eat 3 meals a day with supplement to smallest meal  6. Major depressive disorder with single episode, in partial remission ([place]) -ongoing but stable.   7. Need for immunization against influenza - Flu Vaccine QUAD High Dose(Fluad)  8. Bilateral hearing loss, unspecified hearing loss type -ears clear bilaterally- does not wish to be referred for hearing aides.    Return in about 6 weeks (around 03/19/2023) for weight, sleep, mood follow up.  Mary Higgins. [place], [facility] [phone]

## 2023-02-05 NOTE — Patient Instructions (Addendum)
  To start probiotic daily   To make appt for diabetic eye exam.   Decrease trazodone to half tablet for 1 week then stop Start remeron 7.5 mg daily at bedtime to help with sleep and appetite.

## 2023-02-06 ENCOUNTER — Encounter: Payer: Self-pay | Admitting: Neurology

## 2023-02-06 LAB — MICROALBUMIN / CREATININE URINE RATIO
Creatinine, Urine: 119 mg/dL (ref 20–275)
Microalb Creat Ratio: 107 mg/g creat — ABNORMAL HIGH (ref <30)
Microalb, Ur: 12.7 mg/dL

## 2023-02-06 LAB — CBC WITH DIFFERENTIAL/PLATELET
Absolute Monocytes: 287 cells/uL (ref 200–950)
Basophils Absolute: 52 cells/uL (ref 0–200)
Basophils Relative: 1.1 %
Eosinophils Absolute: 38 cells/uL (ref 15–500)
Eosinophils Relative: 0.8 %
HCT: 36.3 % (ref 35.0–45.0)
Hemoglobin: 12.4 g/dL (ref 11.7–15.5)
Lymphs Abs: 1311 cells/uL (ref 850–3900)
MCH: 30.3 pg (ref 27.0–33.0)
MCHC: 34.2 g/dL (ref 32.0–36.0)
MCV: 88.8 fL (ref 80.0–100.0)
MPV: 10.2 fL (ref 7.5–12.5)
Monocytes Relative: 6.1 %
Neutro Abs: 3013 cells/uL (ref 1500–7800)
Neutrophils Relative %: 64.1 %
Platelets: 270 10*3/uL (ref 140–400)
RBC: 4.09 10*6/uL (ref 3.80–5.10)
RDW: 12.4 % (ref 11.0–15.0)
Total Lymphocyte: 27.9 %
WBC: 4.7 10*3/uL (ref 3.8–10.8)

## 2023-02-06 LAB — COMPLETE METABOLIC PANEL WITH GFR
AG Ratio: 2.2 (calc) (ref 1.0–2.5)
ALT: 5 U/L — ABNORMAL LOW (ref 6–29)
AST: 9 U/L — ABNORMAL LOW (ref 10–35)
Albumin: 4.4 g/dL (ref 3.6–5.1)
Alkaline phosphatase (APISO): 37 U/L (ref 37–153)
BUN: 13 mg/dL (ref 7–25)
CO2: 26 mmol/L (ref 20–32)
Calcium: 9.4 mg/dL (ref 8.6–10.4)
Chloride: 99 mmol/L (ref 98–110)
Creat: 0.8 mg/dL (ref 0.60–0.95)
Globulin: 2 g/dL (calc) (ref 1.9–3.7)
Glucose, Bld: 96 mg/dL (ref 65–99)
Potassium: 4.2 mmol/L (ref 3.5–5.3)
Sodium: 135 mmol/L (ref 135–146)
Total Bilirubin: 0.7 mg/dL (ref 0.2–1.2)
Total Protein: 6.4 g/dL (ref 6.1–8.1)
eGFR: 74 mL/min/{1.73_m2} (ref >=60)

## 2023-02-06 LAB — HEMOGLOBIN A1C
Hgb A1c MFr Bld: 6.1 % of total Hgb — ABNORMAL HIGH (ref <5.7)
Mean Plasma Glucose: 128 mg/dL
eAG (mmol/L): 7.1 mmol/L

## 2023-02-07 ENCOUNTER — Telehealth: Payer: Self-pay

## 2023-02-07 ENCOUNTER — Encounter: Payer: Self-pay | Admitting: Nurse Practitioner

## 2023-02-07 ENCOUNTER — Ambulatory Visit (INDEPENDENT_AMBULATORY_CARE_PROVIDER_SITE_OTHER): Payer: Medicare HMO | Admitting: Nurse Practitioner

## 2023-02-07 DIAGNOSIS — Z0000 Encounter for general adult medical examination without abnormal findings: Secondary | ICD-10-CM

## 2023-02-07 DIAGNOSIS — E2839 Other primary ovarian failure: Secondary | ICD-10-CM

## 2023-02-07 NOTE — Patient Instructions (Signed)
Mary Higgins , Thank you for taking time to come for your Medicare Wellness Visit. I appreciate your ongoing commitment to your health goals. Please review the following plan we discussed and let me know if I can assist you in the future.   Screening recommendations/referrals: Colonoscopy aged out Mammogram aged out Bone Density ordered today- call [phone] Recommended yearly ophthalmology/optometry visit for glaucoma screening and checkup Recommended yearly dental visit for hygiene and checkup  Vaccinations: Influenza vaccine- due annually in September/October Pneumococcal vaccine up to date Tdap vaccine up to date Shingles vaccine up to date    Advanced directives: on file  Conditions/risks identified: advanced age, fall risk  Next appointment: yearly    Preventive Care 82 Years and Older, Female Preventive care refers to lifestyle choices and visits with your health care provider that can promote health and wellness. What does preventive care include? A yearly physical exam. This is also called an annual well check. Dental exams once or twice a year. Routine eye exams. Ask your health care provider how often you should have your eyes checked. Personal lifestyle choices, including: Daily care of your teeth and gums. Regular physical activity. Eating a healthy diet. Avoiding tobacco and drug use. Limiting alcohol use. Practicing safe sex. Taking low-dose aspirin every day. Taking vitamin and mineral supplements as recommended by your health care provider. What happens during an annual well check? The services and screenings done by your health care provider during your annual well check will depend on your age, overall health, lifestyle risk factors, and family history of disease. Counseling  Your health care provider may ask you questions about your: Alcohol use. Tobacco use. Drug use. Emotional well-being. Home and relationship well-being. Sexual activity. Eating  habits. History of falls. Memory and ability to understand (cognition). Work and work Statistician. Reproductive health. Screening  You may have the following tests or measurements: Height, weight, and BMI. Blood pressure. Lipid and cholesterol levels. These may be checked every 5 years, or more frequently if you are over 30 years old. Skin check. Lung cancer screening. You may have this screening every year starting at age 82 if you have a 30-pack-year history of smoking and currently smoke or have quit within the past 15 years. Fecal occult blood test (FOBT) of the stool. You may have this test every year starting at age 82. Flexible sigmoidoscopy or colonoscopy. You may have a sigmoidoscopy every 5 years or a colonoscopy every 10 years starting at age 82. Hepatitis C blood test. Hepatitis B blood test. Sexually transmitted disease (STD) testing. Diabetes screening. This is done by checking your blood sugar (glucose) after you have not eaten for a while (fasting). You may have this done every 1-3 years. Bone density scan. This is done to screen for osteoporosis. You may have this done starting at age 82. Mammogram. This may be done every 1-2 years. Talk to your health care provider about how often you should have regular mammograms. Talk with your health care provider about your test results, treatment options, and if necessary, the need for more tests. Vaccines  Your health care provider may recommend certain vaccines, such as: Influenza vaccine. This is recommended every year. Tetanus, diphtheria, and acellular pertussis (Tdap, Td) vaccine. You may need a Td booster every 10 years. Zoster vaccine. You may need this after age 37. Pneumococcal 13-valent conjugate (PCV13) vaccine. One dose is recommended after age 82. Pneumococcal polysaccharide (PPSV23) vaccine. One dose is recommended after age 82. Talk to your health  care provider about which screenings and vaccines you need and how  often you need them. This information is not intended to replace advice given to you by your health care provider. Make sure you discuss any questions you have with your health care provider. Document Released: 11/26/2015 Document Revised: 07/19/2016 Document Reviewed: 08/31/2015 Elsevier Interactive Patient Education  2017 [place] Prevention in the Home Falls can cause injuries. They can happen to people of all ages. There are many things you can do to make your home safe and to help prevent falls. What can I do on the outside of my home? Regularly fix the edges of walkways and driveways and fix any cracks. Remove anything that might make you trip as you walk through a door, such as a raised step or threshold. Trim any bushes or trees on the path to your home. Use bright outdoor lighting. Clear any walking paths of anything that might make someone trip, such as rocks or tools. Regularly check to see if handrails are loose or broken. Make sure that both sides of any steps have handrails. Any raised decks and porches should have guardrails on the edges. Have any leaves, snow, or ice cleared regularly. Use sand or salt on walking paths during winter. Clean up any spills in your garage right away. This includes oil or grease spills. What can I do in the bathroom? Use night lights. Install grab bars by the toilet and in the tub and shower. Do not use towel bars as grab bars. Use non-skid mats or decals in the tub or shower. If you need to sit down in the shower, use a plastic, non-slip stool. Keep the floor dry. Clean up any water that spills on the floor as soon as it happens. Remove soap buildup in the tub or shower regularly. Attach bath mats securely with double-sided non-slip rug tape. Do not have throw rugs and other things on the floor that can make you trip. What can I do in the bedroom? Use night lights. Make sure that you have a light by your bed that is easy to  reach. Do not use any sheets or blankets that are too big for your bed. They should not hang down onto the floor. Have a firm chair that has side arms. You can use this for support while you get dressed. Do not have throw rugs and other things on the floor that can make you trip. What can I do in the kitchen? Clean up any spills right away. Avoid walking on wet floors. Keep items that you use a lot in easy-to-reach places. If you need to reach something above you, use a strong step stool that has a grab bar. Keep electrical cords out of the way. Do not use floor polish or wax that makes floors slippery. If you must use wax, use non-skid floor wax. Do not have throw rugs and other things on the floor that can make you trip. What can I do with my stairs? Do not leave any items on the stairs. Make sure that there are handrails on both sides of the stairs and use them. Fix handrails that are broken or loose. Make sure that handrails are as long as the stairways. Check any carpeting to make sure that it is firmly attached to the stairs. Fix any carpet that is loose or worn. Avoid having throw rugs at the top or bottom of the stairs. If you do have throw rugs, attach them to  the floor with carpet tape. Make sure that you have a light switch at the top of the stairs and the bottom of the stairs. If you do not have them, ask someone to add them for you. What else can I do to help prevent falls? Wear shoes that: Do not have high heels. Have rubber bottoms. Are comfortable and fit you well. Are closed at the toe. Do not wear sandals. If you use a stepladder: Make sure that it is fully opened. Do not climb a closed stepladder. Make sure that both sides of the stepladder are locked into place. Ask someone to hold it for you, if possible. Clearly mark and make sure that you can see: Any grab bars or handrails. First and last steps. Where the edge of each step is. Use tools that help you move  around (mobility aids) if they are needed. These include: Canes. Walkers. Scooters. Crutches. Turn on the lights when you go into a dark area. Replace any light bulbs as soon as they burn out. Set up your furniture so you have a clear path. Avoid moving your furniture around. If any of your floors are uneven, fix them. If there are any pets around you, be aware of where they are. Review your medicines with your doctor. Some medicines can make you feel dizzy. This can increase your chance of falling. Ask your doctor what other things that you can do to help prevent falls. This information is not intended to replace advice given to you by your health care provider. Make sure you discuss any questions you have with your health care provider. Document Released: 08/26/2009 Document Revised: 04/06/2016 Document Reviewed: 12/04/2014 Elsevier Interactive Patient Education  2017 [employer].

## 2023-02-07 NOTE — Progress Notes (Signed)
  This service is provided via telemedicine  No vital signs collected/recorded due to the encounter was a telemedicine visit.   Location of patient (ex: home, work):  Home  Patient consents to a telephone visit:  Yes  Location of the provider (ex: office, home):  [facility].  Name of any referring provider:  Eubanks, Jessica K, NP   Names of all persons participating in the telemedicine service and their role in the encounter:  Patient, Karynn Deblasi, RMA, Jessica Eubanks, NP.    Time spent on call: 8 minutes spent on the phone with Medical Assistant.   

## 2023-02-07 NOTE — Telephone Encounter (Signed)
   Telephone encounter was:  Unsuccessful.  02/07/2023 Name: Mary Higgins MRN: [id number] DOB: 1941-07-19  Unsuccessful outbound call made today to assist with:  Transportation Needs   Outreach Attempt:  1st Attempt  A HIPAA compliant voice message was left requesting a return call.  Instructed patient to call back    [facility] [phone] [address], [place], [place] [address] Phone: [phone] Email: [email or web address]

## 2023-02-07 NOTE — Progress Notes (Signed)
Subjective:   Mary Higgins is a 82 y.o. female who presents for Medicare Annual (Subsequent) preventive examination.  Review of Systems     Cardiac Risk Factors include: advanced age (>48men, >70 women);diabetes mellitus;hypertension     Objective:    There were no vitals filed for this visit. There is no height or weight on file to calculate BMI.     02/07/2023    3:12 PM 02/05/2023    1:03 PM 09/20/2022    8:32 PM 09/12/2022    4:25 PM 03/03/2022    1:58 PM 01/16/2022    2:45 PM 12/05/2021    2:55 PM  Advanced Directives  Does Patient Have a Medical Advance Directive? No No No No Yes Yes No  Type of Advance Directive     Healthcare Power of [place]   Does patient want to make changes to medical advance directive?     No - Patient declined No - Patient declined   Copy of [place] in Chart?     Yes - validated most recent copy scanned in chart (See row information) Yes - validated most recent copy scanned in chart (See row information)   Would patient like information on creating a medical advance directive? No - Patient declined No - Patient declined  No - Patient declined   No - Patient declined    Current Medications (verified) Outpatient Encounter Medications as of 02/07/2023  Medication Sig   Artificial Tear Ointment (DRY EYES OP) Place 1 drop into both eyes daily as needed (dry eyes).   Blood Glucose Monitoring Suppl (ONE TOUCH ULTRA 2) w/Device KIT USE UP TO FOUR TIMES DAILY AS DIRECTED   clonazePAM (KLONOPIN) 1 MG tablet TAKE 1 TABLET(1 MG) BY MOUTH THREE TIMES DAILY AS NEEDED   losartan (COZAAR) 100 MG tablet TAKE 1/2 TABLET BY MOUTH IN THE MORNING AND IN THE EVENING   metFORMIN (GLUCOPHAGE) 500 MG tablet TAKE 1 TABLET BY MOUTH WITH BREAKFAST AND WITH SUPPER FOR DIABETES   mirtazapine (REMERON) 7.5 MG tablet Take 2 tablets (15 mg total) by mouth at bedtime.   pantoprazole (PROTONIX) 40 MG tablet TAKE 1 TABLET(40 MG)  BY MOUTH DAILY   No facility-administered encounter medications on file as of 02/07/2023.    Allergies (verified) Oxaprozin   History: Past Medical History:  Diagnosis Date   Anxiety and depression    Hx of   Blepharospasm     Breast cancer ([place]) 1987   s/p lumpectomy.   Carotid bruit    Bilateral. Mild to moderate disease on LEFT and mild on the RIGHT in 2012   COPD (chronic obstructive pulmonary disease) (HCC)    mild. Not on inhalers.   Diabetes mellitus without complication ([place])    AODM   Fuchs' corneal dystrophy    GERD (gastroesophageal reflux disease)     Headache    Hyperlipidemia    Hypertension     Insomnia     Intracerebral bleed ([place]) 2003   Remote intracerebral blled and coiling by Dr. Estanislado Pandy for cerebral aneurysm and a shunt placed by Dr. Vertell Limber remotely. No seizure disorder and this happened in 2003 with an anterior communicating aneurysm.   Meningioma ([place])    left parafalcine calcified meningioma   Neurocardiogenic syncope    a. s/p Biotronik dual chamber pacemaker   Pacemaker    Personal history of radiation therapy    S/P VP shunt 2003   Sinus node dysfunction ([place])  Tobacco abuse     Type II or unspecified type diabetes mellitus without mention of complication, not stated as uncontrolled     Past Surgical History:  Procedure Laterality Date   ANEURYSM COILING  11/13/2001   Cerebral aneurysm ;Dr. Luanne Bras   BREAST LUMPECTOMY Right    For cancer, no recurrence   CEREBRAL ANEURYSM REPAIR  2023   CHOLECYSTECTOMY     CRANIOTOMY Right 08/04/2021   Procedure: BURR HOLES FOR SUBDURAL HEMATOMA;  Surgeon: Judith Part, MD;  Location: [facility];  Service: Neurosurgery;  Laterality: Right;   IR ANGIO INTRA EXTRACRAN SEL INTERNAL CAROTID UNI R MOD SED  09/30/2021   IR ANGIO VERTEBRAL SEL VERTEBRAL UNI R MOD SED  09/30/2021   IR TRANSCATH/EMBOLIZ  09/30/2021   PACEMAKER GENERATOR CHANGE N/A 09/25/2014   BTK dual chamber pacemkaer implanted  by Dr Rayann Heman   PACEMAKER INSERTION  07/12/2006   St. Jude Victory XL DR  -  Dr. Tami Ribas   PARTIAL HYSTERECTOMY     RADIOLOGY WITH ANESTHESIA N/A 09/30/2021   Procedure: Arteriogram, Onyx embolization of right middle meningeal artery;  Surgeon: Consuella Lose, MD;  Location: [facility];  Service: Radiology;  Laterality: N/A;   VENTRICULOPERITONEAL SHUNT  11/13/2001   Dr. Erline Levine   Family History  Problem Relation Age of Onset   Cancer Mother    Heart attack Father    Heart attack Son        Heart Pump placed    Diabetes Son    Hypertension Other    Social History   Socioeconomic History   Marital status: Legally Separated    Spouse name: Not on file   Number of children: Not on file   Years of education: Not on file   Highest education level: Not on file  Occupational History   Not on file  Tobacco Use   Smoking status: Some Days    Years: 15    Types: Cigarettes    Start date: 01/12/2019   Smokeless tobacco: Never   Tobacco comments:    1 cig weekly sometimes will go longer than 1 week w/o smoking   Vaping Use   Vaping Use: Never used  Substance and Sexual Activity   Alcohol use: No    Alcohol/week: 0.0 standard drinks of alcohol   Drug use: Never   Sexual activity: Not Currently  Other Topics Concern   Not on file  Social History Narrative   Not on file   Social Determinants of Health   Financial Resource Strain: Low Risk  (05/13/2018)   Overall Financial Resource Strain (CARDIA)    Difficulty of Paying Living Expenses: Not hard at all  Food Insecurity: No Food Insecurity (05/13/2018)   Hunger Vital Sign    Worried About Running Out of Food in the Last Year: Never true    Ran Out of Food in the Last Year: Never true  Transportation Needs: No Transportation Needs (05/13/2018)   PRAPARE - Hydrologist (Medical): No    Lack of Transportation (Non-Medical): No  Physical Activity: Inactive (05/13/2018)   Exercise Vital Sign    Days of  Exercise per Week: 0 days    Minutes of Exercise per Session: 0 min  Stress: No Stress Concern Present (05/13/2018)   [place]    Feeling of Stress : Only a little  Social Connections: Moderately Isolated (05/13/2018)   Social Connection and Isolation Panel [  NHANES]    Frequency of Communication with Friends and Family: More than three times a week    Frequency of Social Gatherings with Friends and Family: More than three times a week    Attends Religious Services: Never    Marine scientist or Organizations: No    Attends Music therapist: Never    Marital Status: Separated    Tobacco Counseling Ready to quit: Not Answered Counseling given: Not Answered Tobacco comments: 1 cig weekly sometimes will go longer than 1 week w/o smoking    Clinical Intake:  Pre-visit preparation completed: Yes  Pain : No/denies pain     BMI - recorded: 18.44 Nutritional Status: BMI <19  Underweight Nutritional Risks: Unintentional weight loss  How often do you need to have someone help you when you read instructions, pamphlets, or other written materials from your doctor or pharmacy?: 4 - Often  Diabetic?no         Activities of Daily Living    02/07/2023    3:21 PM  In your present state of health, do you have any difficulty performing the following activities:  Hearing? 1  Vision? 1  Difficulty concentrating or making decisions? 1  Walking or climbing stairs? 0  Dressing or bathing? 1  Doing errands, shopping? 1  Preparing Food and eating ? Y  Comment daughter helps  Using the Toilet? N  In the past six months, have you accidently leaked urine? N  Do you have problems with loss of bowel control? Y  Managing your Medications? Y  Managing your Finances? Y  Housekeeping or managing your Housekeeping? Y    Patient Care Team: [name], NP as PCP - General (Geriatric Medicine) Patsey Berthold, NP (Inactive) as Nurse Practitioner (Cardiology) Darleen Crocker, MD as Consulting Physician (Ophthalmology) Thompson Grayer, MD (Inactive) as Consulting Physician (Cardiology) Otelia Sergeant, OD as Referring Physician (Ophthalmology)  Indicate any recent Medical Services you may have received from other than [facility] providers in the past year (date may be approximate).     Assessment:   This is a routine wellness examination for [place].  Hearing/Vision screen Hearing Screening - Comments:: No hearing concerns.  Vision Screening - Comments:: Some vision concerns. Patient last eye exam 2 years ago. Patient wears prescription glasses.   Dietary issues and exercise activities discussed: Current Exercise Habits: The patient does not participate in regular exercise at present   Goals Addressed   None   Depression Screen    02/07/2023    3:06 PM 02/05/2023    1:03 PM 12/05/2021    2:55 PM 11/10/2021    2:46 PM 09/24/2019    9:28 AM 05/19/2019    2:55 PM 05/13/2018   12:44 PM  PHQ 2/9 Scores  PHQ - 2 Score 0 0 0 0 6 0 2  PHQ- 9 Score     15  6    Fall Risk    02/07/2023    3:06 PM 02/05/2023    1:03 PM 03/03/2022    1:57 PM 12/05/2021    2:54 PM 11/10/2021    2:49 PM  [place] in the past year? 0 0 1 1 1   Number falls in past yr: 0 0 1 1 1   Injury with Fall? 0 0 0 1 0  Risk for fall due to : No Fall Risks  History of fall(s) History of fall(s);Impaired balance/gait History of fall(s)  Follow up Falls evaluation completed  Falls evaluation completed Falls evaluation completed Falls evaluation completed    FALL RISK PREVENTION PERTAINING TO THE HOME:  Any stairs in or around the home? Yes  If so, are there any without handrails? No  Home free of loose throw rugs in walkways, pet beds, electrical cords, etc? Yes  Adequate lighting in your home to reduce risk of falls? Yes   ASSISTIVE DEVICES UTILIZED TO PREVENT FALLS:  Life alert? No  Use of a cane, walker or  w/c? Yes  Grab bars in the bathroom? No  Shower chair or bench in shower? Yes  Elevated toilet seat or a handicapped toilet? Yes   TIMED UP AND GO:  Was the test performed? No .    Cognitive Function:    05/13/2018    1:02 PM 07/11/2017    9:26 AM 03/14/2016    3:48 PM  MMSE - Mini Mental State Exam  Orientation to time 5 3 4   Orientation to Place 5 4 4   Registration 3 3 3   Attention/ Calculation 5 5 5   Recall 2 2 2   Language- name 2 objects 2 2 2   Language- repeat 1 1 1   Language- follow 3 step command 3 3 3   Language- read & follow direction 1 1 1   Write a sentence 1 1 1   Copy design 1 1 1   Total score 29 26 27         02/07/2023    3:07 PM 11/10/2021    2:53 PM 05/19/2019    3:01 PM  6CIT Screen  What Year? 4 points 0 points 0 points  What month? 0 points 0 points 0 points  What time? 0 points 0 points 0 points  Count back from 20 0 points 0 points 0 points  Months in reverse 0 points 2 points 0 points  Repeat phrase 4 points 6 points 4 points  Total Score 8 points 8 points 4 points    Immunizations Immunization History  Administered Date(s) Administered   Fluad Quad(high Dose 65+) 12/05/2021, 02/05/2023   Influenza, High Dose Seasonal PF 07/11/2017, 10/01/2018, 09/16/2019   Influenza,inj,Quad PF,6+ Mos 08/18/2015, 12/13/2016   PFIZER(Purple Top)SARS-COV-2 Vaccination 01/20/2020, 02/20/2020, 11/10/2020, 03/19/2021   Pneumococcal Conjugate-13 08/18/2015   Pneumococcal Polysaccharide-23 07/11/2017   Tdap 03/19/2021   Zoster Recombinat (Shingrix) 08/21/2017, 02/05/2018   Zoster, Live 03/16/2015    TDAP status: Up to date  Flu Vaccine status: Up to date  Pneumococcal vaccine status: Up to date  Covid-19 vaccine status: Information provided on how to obtain vaccines.   Qualifies for Shingles Vaccine? Yes   Zostavax completed No   Shingrix Completed?: Yes  Screening Tests Health Maintenance  Topic Date Due   COVID-19 Vaccine (5 - 2023-24 season)  07/14/2022   OPHTHALMOLOGY EXAM  09/06/2022   HEMOGLOBIN A1C  08/08/2023   Diabetic kidney evaluation - eGFR measurement  02/05/2024   Diabetic kidney evaluation - Urine ACR  02/05/2024   FOOT EXAM  02/05/2024   Medicare Annual Wellness (AWV)  02/07/2024   DTaP/Tdap/Td (2 - Td or Tdap) 03/20/2031   Pneumonia Vaccine 55+ Years old  Completed   INFLUENZA VACCINE  Completed   DEXA SCAN  Completed   Zoster Vaccines- Shingrix  Completed   HPV VACCINES  Aged Out    Health Maintenance  Health Maintenance Due  Topic Date Due   COVID-19 Vaccine (5 - 2023-24 season) 07/14/2022   OPHTHALMOLOGY EXAM  09/06/2022    Colorectal cancer screening: No longer required.   Mammogram  status: No longer required due to age.  Bone Density status: Ordered today. Pt provided with contact info and advised to call to schedule appt.  Lung Cancer Screening: (Low Dose CT Chest recommended if Age 30-80 years, 30 pack-year currently smoking OR have quit w/in 15years.) does not qualify.   Lung Cancer Screening Referral: AGED OUT  Additional Screening:  Hepatitis C Screening: does not qualify  Vision Screening: Recommended annual ophthalmology exams for early detection of glaucoma and other disorders of the eye. Is the patient up to date with their annual eye exam?  Yes  Who is the provider or what is the name of the office in which the patient attends annual eye exams? [facility]    If pt is not established with a provider, would they like to be referred to a provider to establish care? No .   Dental Screening: Recommended annual dental exams for proper oral hygiene  Community Resource Referral / Chronic Care Management: CRR required this visit?  No   CCM required this visit?  No      Plan:     I have personally reviewed and noted the following in the patient's chart:   Medical and social history Use of alcohol, tobacco or illicit drugs  Current medications and supplements including opioid  prescriptions. Patient is not currently taking opioid prescriptions. Functional ability and status Nutritional status Physical activity Advanced directives List of other physicians Hospitalizations, surgeries, and ER visits in previous 12 months Vitals Screenings to include cognitive, depression, and falls Referrals and appointments  In addition, I have reviewed and discussed with patient certain preventive protocols, quality metrics, and best practice recommendations. A written personalized care plan for preventive services as well as general preventive health recommendations were provided to patient.     [name], NP   02/07/2023   Virtual Visit via Video Note  I connected with Mary Higgins on 02/07/23 at  3:00 PM EDT by a video enabled telemedicine application and verified that I am speaking with the correct person using two identifiers.  Location: Patient: home Provider: [place]   I discussed the limitations of evaluation and management by telemedicine and the availability of in person appointments. The patient expressed understanding and agreed to proceed.    I discussed the assessment and treatment plan with the patient. The patient was provided an opportunity to ask questions and all were answered. The patient agreed with the plan and demonstrated an understanding of the instructions.   The patient was advised to call back or seek an in-person evaluation if the symptoms worsen or if the condition fails to improve as anticipated.  I provided 15 minutes of non-face-to-face time during this encounter.  Carlos American. Dewaine Oats, AGNP Avs printed and mailed.

## 2023-02-12 ENCOUNTER — Telehealth: Payer: Self-pay

## 2023-02-12 NOTE — Telephone Encounter (Signed)
   Telephone encounter was:  Successful.  02/12/2023 Name: Mary Higgins MRN: [id number] DOB: 11-Jun-1941  Mary Higgins is a 82 y.o. year old female who is a primary care patient of [name], NP . The community resource team was consulted for assistance with Transportation Needs   Care guide performed the following interventions: Patient provided with information about care guide support team and interviewed to confirm resource needs. Patient has no need for transportation at this time   Follow Up Plan:  No further follow up planned at this time. The patient has been provided with needed resources.   [facility] [phone] [address], [place], [place] [address] Phone: [phone] Email: [email or web address]

## 2023-02-23 ENCOUNTER — Other Ambulatory Visit: Payer: Self-pay | Admitting: Pharmacist

## 2023-02-23 ENCOUNTER — Other Ambulatory Visit: Payer: Self-pay | Admitting: Nurse Practitioner

## 2023-02-23 DIAGNOSIS — G245 Blepharospasm: Secondary | ICD-10-CM

## 2023-02-23 NOTE — Telephone Encounter (Signed)
Pharmacy requested refill Epic LR: 11/01/2022  Pended Rx and sent to [facility] for approval.

## 2023-02-23 NOTE — Progress Notes (Signed)
   02/23/2023 Name: Mary Higgins MRN: [id number] DOB: 1941/05/08    [name] is a 82 y.o. year old female who presented for a telephone visit.   They were referred to the pharmacist by a quality report for assistance in managing  quality reports med adherence diabetes (MAD), med adherence hypertension [facility]) .   Subjective:  Care Team: Primary Care Provider: Sharon Seller, NP   Medication Access/Adherence  Current Pharmacy:  [facility] [phone] - [place], [place] - [address] AT [facility] [address] [place] [address] Phone: [phone] Fax: [phone]   Diabetes:  Current medications: metformin 500mg  BID   Objective:  Lab Results  Component Value Date   HGBA1C 6.1 (H) 02/05/2023    Lab Results  Component Value Date   CREATININE 0.80 02/05/2023   BUN 13 02/05/2023   NA 135 02/05/2023   K 4.2 02/05/2023   CL 99 02/05/2023   CO2 26 02/05/2023    Lab Results  Component Value Date   CHOL 125 08/03/2021   HDL 34 (L) 08/03/2021   LDLCALC 71 08/03/2021   TRIG 114 08/05/2021   CHOLHDL 3.7 08/03/2021    Medications Reviewed Today     Reviewed by Dicky Doe, CMA (Certified Medical Assistant) on 02/07/23 at 1505  Med List Status: <None>   Medication Order Taking? Sig Documenting Provider Last Dose Status Informant  Artificial Tear Ointment (DRY EYES OP) [id number] Yes Place 1 drop into both eyes daily as needed (dry eyes). [provider] Taking Active Self  Blood Glucose Monitoring Suppl (ONE TOUCH ULTRA 2) w/Device KIT [id number] Yes USE UP TO FOUR TIMES DAILY AS DIRECTED Sharon Seller, NP Taking Active Self  clonazePAM (KLONOPIN) 1 MG tablet [id number] Yes TAKE 1 TABLET(1 MG) BY MOUTH THREE TIMES DAILY AS NEEDED Sharon Seller, NP Taking Active   losartan (COZAAR) 100 MG tablet [id number] Yes TAKE 1/2 TABLET BY MOUTH IN THE MORNING AND IN THE Marnee Guarneri,  NP Taking Active Self  metFORMIN (GLUCOPHAGE) 500 MG tablet [id number] Yes TAKE 1 TABLET BY MOUTH WITH BREAKFAST AND WITH SUPPER FOR DIABETES Sharon Seller, NP Taking Active   mirtazapine (REMERON) 7.5 MG tablet [id number] Yes Take 2 tablets (15 mg total) by mouth at bedtime. Sharon Seller, NP Taking Active   pantoprazole (PROTONIX) 40 MG tablet [id number] Yes TAKE 1 TABLET(40 MG) BY MOUTH DAILY Janyth Contes, Janene Harvey, NP Taking Active Self              Assessment/Plan:   Diabetes: - Currently controlled, Reviewed fill history and confirmed usage with patient, no concerns identified at this time - Of note, patient engaged with conversation, but also requested a callback when daughter is home. Attempted 2nd callback during requested timeframe, but no answer, left voicemail - Recommend to continue current regimen     Follow Up Plan: will attempt repeat outreach. On 2nd call, left voicemail with my direct callback for patient to call at their convenience.  Lynnda Shields, PharmD, BCPS Clinical Pharmacist [facility]

## 2023-03-01 ENCOUNTER — Ambulatory Visit (INDEPENDENT_AMBULATORY_CARE_PROVIDER_SITE_OTHER): Payer: Medicare HMO

## 2023-03-01 DIAGNOSIS — I495 Sick sinus syndrome: Secondary | ICD-10-CM

## 2023-03-01 LAB — CUP PACEART REMOTE DEVICE CHECK
Battery Voltage: 45
Date Time Interrogation Session: 20240418070054
Implantable Lead Connection Status: 753985
Implantable Lead Connection Status: 753985
Implantable Lead Implant Date: 20070830
Implantable Lead Implant Date: 20070830
Implantable Lead Location: 753859
Implantable Lead Location: 753860
Implantable Pulse Generator Implant Date: 20151113
Pulse Gen Model: 394931
Pulse Gen Serial Number: 68408118

## 2023-03-07 ENCOUNTER — Telehealth: Payer: Self-pay

## 2023-03-07 NOTE — Telephone Encounter (Signed)
Patient's daughter called stating that patient has had diarrhea for about 4-5 days. She says that now she is starting to cramp and can't keep anything down. I offered her to come in office tomorrow,but she stated that she doesn't know if she will be able to because of the diarrhea.Please advise  Message sent to Abbey Chatters, NP

## 2023-03-08 NOTE — Telephone Encounter (Signed)
If she is unable to eat or drink anything she is likely dehydrated and needs to go to Urgent care for fluids.

## 2023-03-09 NOTE — Telephone Encounter (Signed)
Left detailed message for Mary Higgins and to call office if any questions

## 2023-03-19 ENCOUNTER — Encounter: Payer: Self-pay | Admitting: Nurse Practitioner

## 2023-03-19 ENCOUNTER — Ambulatory Visit (INDEPENDENT_AMBULATORY_CARE_PROVIDER_SITE_OTHER): Payer: Medicare HMO | Admitting: Nurse Practitioner

## 2023-03-19 VITALS — BP 124/86 | HR 72 | Temp 95.1°F | Ht 71.0 in | Wt 128.0 lb

## 2023-03-19 DIAGNOSIS — E440 Moderate protein-calorie malnutrition: Secondary | ICD-10-CM

## 2023-03-19 DIAGNOSIS — E1169 Type 2 diabetes mellitus with other specified complication: Secondary | ICD-10-CM

## 2023-03-19 DIAGNOSIS — R413 Other amnesia: Secondary | ICD-10-CM | POA: Diagnosis not present

## 2023-03-19 MED ORDER — MIRTAZAPINE 15 MG PO TABS: 15.0000 mg | ORAL_TABLET | Freq: Every day | ORAL | 1 refills | Status: DC

## 2023-03-19 NOTE — Progress Notes (Signed)
Careteam: Patient Care Team: Sharon Seller, NP as PCP - General (Geriatric Medicine) Marily Lente, NP (Inactive) as Nurse Practitioner (Cardiology) Mia Creek, MD as Consulting Physician (Ophthalmology) Hillis Range, MD (Inactive) as Consulting Physician (Cardiology) Jill Side, OD as Referring Physician (Ophthalmology)  PLACE OF SERVICE:  [facility]  Advanced Directive information    Allergies  Allergen Reactions   Oxaprozin Rash    Chief Complaint  Patient presents with   Medical Management of Chronic Issues    Patient presents today for a 5 weeks follow-up   Quality Metric Gaps    Eye exam   Acute Visit    Patient presents for a fall that occurred today, 03/19/23 and reports hitting head and elbows. She reports a headache currently.     HPI: Patient is a 82 y.o. female for follow up on weight, mood and sleep.  Her daughter is here with her. Reports she took Remeron for 15 days but did not feel like it was making a lot of difference and then forgot to get it refilled.   She still has trouble going to sleep- has trouble going to sleep- "comes alive at midnight"  She will get up if she can not go right to sleep then she lay right down and go to sleep.   She stopped metformin after last visit.   Had a bout of diarrhea for a week but normal for the last 2 weeks.   Standing in the kitchen and feel straight back  Was watching her hair in the sink then stood up and was drying hair with a towel.  Hit her head on a cabinet. Did not break the skin, no increase in dizziness, headache, blurred vision noted.    Review of Systems:  Review of Systems  Constitutional:  Positive for weight loss. Negative for chills and fever.  HENT:  Negative for tinnitus.   Respiratory:  Negative for cough, sputum production and shortness of breath.   Cardiovascular:  Negative for chest pain, palpitations and leg swelling.  Gastrointestinal:  Negative for abdominal pain,  constipation, diarrhea and heartburn.  Genitourinary:  Negative for dysuria, frequency and urgency.  Musculoskeletal:  Negative for back pain, falls, joint pain and myalgias.  Skin: Negative.   Neurological:  Negative for dizziness and headaches.  Psychiatric/Behavioral:  Negative for depression and memory loss. The patient does not have insomnia.     Past Medical History:  Diagnosis Date   Anxiety and depression    Hx of   Blepharospasm     Breast cancer (HCC) 1987   s/p lumpectomy.   Carotid bruit    Bilateral. Mild to moderate disease on LEFT and mild on the RIGHT in 2012   COPD (chronic obstructive pulmonary disease) (HCC)    mild. Not on inhalers.   Diabetes mellitus without complication (HCC)    AODM   Fuchs' corneal dystrophy    GERD (gastroesophageal reflux disease)     Headache    Hyperlipidemia    Hypertension     Insomnia     Intracerebral bleed (HCC) 2003   Remote intracerebral blled and coiling by Dr. Corliss Skains for cerebral aneurysm and a shunt placed by Dr. Venetia Maxon remotely. No seizure disorder and this happened in 2003 with an anterior communicating aneurysm.   Meningioma (HCC)    left parafalcine calcified meningioma   Neurocardiogenic syncope    a. s/p Biotronik dual chamber pacemaker   Pacemaker    Personal history of radiation  therapy    S/P VP shunt 2003   Sinus node dysfunction (HCC)     Tobacco abuse     Type II or unspecified type diabetes mellitus without mention of complication, not stated as uncontrolled     Past Surgical History:  Procedure Laterality Date   ANEURYSM COILING  11/13/2001   Cerebral aneurysm ;Dr. Julieanne Cotton   BREAST LUMPECTOMY Right    For cancer, no recurrence   CEREBRAL ANEURYSM REPAIR  2023   CHOLECYSTECTOMY     CRANIOTOMY Right 08/04/2021   Procedure: BURR HOLES FOR SUBDURAL HEMATOMA;  Surgeon: Jadene Pierini, MD;  Location: [facility] OR;  Service: Neurosurgery;  Laterality: Right;   IR ANGIO INTRA EXTRACRAN SEL  INTERNAL CAROTID UNI R MOD SED  09/30/2021   IR ANGIO VERTEBRAL SEL VERTEBRAL UNI R MOD SED  09/30/2021   IR TRANSCATH/EMBOLIZ  09/30/2021   PACEMAKER GENERATOR CHANGE N/A 09/25/2014   BTK dual chamber pacemkaer implanted by Dr Johney Frame   PACEMAKER INSERTION  07/12/2006   St. Jude Victory XL DR  -  Dr. Jenne Campus   PARTIAL HYSTERECTOMY     RADIOLOGY WITH ANESTHESIA N/A 09/30/2021   Procedure: Arteriogram, Onyx embolization of right middle meningeal artery;  Surgeon: Lisbeth Renshaw, MD;  Location[facility];  Service: Radiology;  Laterality: N/A;   VENTRICULOPERITONEAL SHUNT  11/13/2001   Dr. Maeola Harman   Social History:   reports that she has been smoking cigarettes. She started smoking about 4 years ago. She has never used smokeless tobacco. She reports that she does not drink alcohol and does not use drugs.  Family History  Problem Relation Age of Onset   Cancer Mother    Heart attack Father    Heart attack Son        Heart Pump placed    Diabetes Son    Hypertension Other     Medications: Patient's Medications  New Prescriptions   No medications on file  Previous Medications   ARTIFICIAL TEAR OINTMENT (DRY EYES OP)    Place 1 drop into both eyes daily as needed (dry eyes).   BLOOD GLUCOSE MONITORING SUPPL (ONE TOUCH ULTRA 2) W/DEVICE KIT    USE UP TO FOUR TIMES DAILY AS DIRECTED   CLONAZEPAM (KLONOPIN) 1 MG TABLET    TAKE 1 TABLET(1 MG) BY MOUTH THREE TIMES DAILY AS NEEDED   LOSARTAN (COZAAR) 100 MG TABLET    TAKE 1/2 TABLET BY MOUTH IN THE MORNING AND IN THE EVENING   METFORMIN (GLUCOPHAGE) 500 MG TABLET    TAKE 1 TABLET BY MOUTH WITH BREAKFAST AND WITH SUPPER FOR DIABETES   MIRTAZAPINE (REMERON) 7.5 MG TABLET    Take 2 tablets (15 mg total) by mouth at bedtime.   PANTOPRAZOLE (PROTONIX) 40 MG TABLET    TAKE 1 TABLET(40 MG) BY MOUTH DAILY  Modified Medications   No medications on file  Discontinued Medications   No medications on file    Physical Exam:  Vitals:    03/19/23 1333  BP: 124/86  Pulse: 72  Temp: (!) 95.1 F (35.1 C)  SpO2: 96%  Weight: 128 lb (58.1 kg)  Height: 5\' 11"  (1.803 m)   Body mass index is 17.85 kg/m. Wt Readings from Last 3 Encounters:  03/19/23 128 lb (58.1 kg)  02/05/23 132 lb 3.2 oz (60 kg)  09/20/22 134 lb 7.7 oz (61 kg)    Physical Exam Constitutional:      General: She is not in acute distress.  Appearance: She is well-developed. She is not diaphoretic.  HENT:     Head: Normocephalic and atraumatic.     Mouth/Throat:     Pharynx: No oropharyngeal exudate.  Eyes:     Conjunctiva/sclera: Conjunctivae normal.     Pupils: Pupils are equal, round, and reactive to light.  Cardiovascular:     Rate and Rhythm: Normal rate and regular rhythm.     Heart sounds: Normal heart sounds.  Pulmonary:     Effort: Pulmonary effort is normal.     Breath sounds: Normal breath sounds.  Abdominal:     General: Bowel sounds are normal.     Palpations: Abdomen is soft.  Musculoskeletal:     Cervical back: Normal range of motion and neck supple.     Right lower leg: No edema.     Left lower leg: No edema.  Skin:    General: Skin is warm and dry.  Neurological:     General: No focal deficit present.     Mental Status: She is alert. Mental status is at baseline.     Gait: Gait abnormal.  Psychiatric:        Mood and Affect: Mood normal.     Labs reviewed: Basic Metabolic Panel: Recent Labs    08/02/22 1025 09/12/22 1500 09/20/22 2228 09/21/22 0207 09/21/22 0407 02/05/23 1329  NA 139   < > 137  --  136 135  K 4.6   < > 4.0  --  3.3* 4.2  CL 101   < > 106  --  102 99  CO2 28   < > 25  --  24 26  GLUCOSE 135   < > 158*  --  102* 96  BUN 12   < > 9  --  7* 13  CREATININE 0.91   < > 0.73  --  0.66 0.80  CALCIUM 9.8   < > 9.0  --  9.0 9.4  MG  --   --   --  1.8 2.0  --   PHOS  --   --   --  2.8 3.0  --   TSH 1.30  --   --  1.538  --   --    < > = values in this interval not displayed.   Liver Function  Tests: Recent Labs    09/12/22 1500 09/20/22 2228 09/21/22 0407 02/05/23 1329  AST 10* 10* 13* 9*  ALT 8 6 10  5*  ALKPHOS 29* 32* 34*  --   BILITOT 0.7 1.1 1.0 0.7  PROT 5.6* 5.8* 6.3* 6.4  ALBUMIN 3.5 3.6 3.9  --    No results for input(s): "LIPASE", "AMYLASE" in the last 8760 hours. No results for input(s): "AMMONIA" in the last 8760 hours. CBC: Recent Labs    09/12/22 1500 09/12/22 1517 09/20/22 2228 09/21/22 0407 02/05/23 1329  WBC 4.3  --  8.2 8.1 4.7  NEUTROABS 2.0  --   --  4.5 3,013  HGB 11.9*   < > 12.2 12.2 12.4  HCT 36.4   < > 36.1 36.8 36.3  MCV 93.6  --  91.9 91.8 88.8  PLT 204  --  224 243 270   < > = values in this interval not displayed.   Lipid Panel: No results for input(s): "CHOL", "HDL", "LDLCALC", "TRIG", "CHOLHDL", "LDLDIRECT" in the last 8760 hours. TSH: Recent Labs    08/02/22 1025 09/21/22 0207  TSH 1.30 1.538   A1C: Lab Results  Component Value Date   HGBA1C 6.1 (H) 02/05/2023     Assessment/Plan 1. Memory loss Ongoing, no changes in cognitive or functional status, continue supportive care.   2. Moderate protein-calorie malnutrition (HCC) -will restart remeron qhs  -to add protein supplement daily -recommended 3 meals a day  3. Type 2 diabetes mellitus with other specified complication, without long-term current use of insulin (HCC) -she has stopped metformin, will follow up A1c at next visit.    Return in about 2 months (around 05/19/2023) for routine follow up.  Janene Harvey. Biagio Borg [facility] [phone]

## 2023-03-19 NOTE — Patient Instructions (Signed)
To start drinking boost or ensure daily- also need to try to eat 3 meals a day

## 2023-04-02 NOTE — Progress Notes (Signed)
Remote pacemaker transmission.   

## 2023-04-24 ENCOUNTER — Other Ambulatory Visit: Payer: Self-pay | Admitting: Nurse Practitioner

## 2023-04-24 DIAGNOSIS — G245 Blepharospasm: Secondary | ICD-10-CM

## 2023-04-24 NOTE — Telephone Encounter (Signed)
Medication last refilled 02/23/23. No up to date treatment agreement on file.   Medication pended and sent to Abbey Chatters, NP

## 2023-05-25 ENCOUNTER — Ambulatory Visit: Payer: Medicare HMO | Admitting: Nurse Practitioner

## 2023-05-31 ENCOUNTER — Ambulatory Visit (INDEPENDENT_AMBULATORY_CARE_PROVIDER_SITE_OTHER): Payer: Medicare HMO

## 2023-05-31 DIAGNOSIS — I495 Sick sinus syndrome: Secondary | ICD-10-CM

## 2023-05-31 LAB — CUP PACEART REMOTE DEVICE CHECK
Battery Voltage: 45
Date Time Interrogation Session: 20240718075609
Implantable Lead Connection Status: 753985
Implantable Lead Connection Status: 753985
Implantable Lead Implant Date: 20070830
Implantable Lead Implant Date: 20070830
Implantable Lead Location: 753859
Implantable Lead Location: 753860
Implantable Pulse Generator Implant Date: 20151113
Pulse Gen Model: 394931
Pulse Gen Serial Number: 68408118

## 2023-06-04 ENCOUNTER — Encounter (HOSPITAL_BASED_OUTPATIENT_CLINIC_OR_DEPARTMENT_OTHER): Payer: Self-pay | Admitting: Emergency Medicine

## 2023-06-04 ENCOUNTER — Other Ambulatory Visit: Payer: Self-pay

## 2023-06-04 ENCOUNTER — Emergency Department (HOSPITAL_BASED_OUTPATIENT_CLINIC_OR_DEPARTMENT_OTHER): Payer: Medicare HMO

## 2023-06-04 ENCOUNTER — Emergency Department (HOSPITAL_BASED_OUTPATIENT_CLINIC_OR_DEPARTMENT_OTHER)
Admission: EM | Admit: 2023-06-04 | Discharge: 2023-06-04 | Disposition: A | Discharge status: Home / Self Care | Payer: Medicare HMO | Source: Home / Self Care | Attending: Emergency Medicine | Admitting: Emergency Medicine

## 2023-06-04 DIAGNOSIS — W19XXXA Unspecified fall, initial encounter: Secondary | ICD-10-CM

## 2023-06-04 DIAGNOSIS — Y9301 Activity, walking, marching and hiking: Secondary | ICD-10-CM | POA: Diagnosis not present

## 2023-06-04 DIAGNOSIS — Z79899 Other long term (current) drug therapy: Secondary | ICD-10-CM | POA: Diagnosis not present

## 2023-06-04 DIAGNOSIS — S0990XA Unspecified injury of head, initial encounter: Secondary | ICD-10-CM | POA: Diagnosis not present

## 2023-06-04 DIAGNOSIS — Y92003 Bedroom of unspecified non-institutional (private) residence as the place of occurrence of the external cause: Secondary | ICD-10-CM | POA: Diagnosis not present

## 2023-06-04 DIAGNOSIS — W01198A Fall on same level from slipping, tripping and stumbling with subsequent striking against other object, initial encounter: Secondary | ICD-10-CM | POA: Insufficient documentation

## 2023-06-04 DIAGNOSIS — S199XXA Unspecified injury of neck, initial encounter: Secondary | ICD-10-CM | POA: Diagnosis not present

## 2023-06-04 DIAGNOSIS — S0101XA Laceration without foreign body of scalp, initial encounter: Secondary | ICD-10-CM | POA: Diagnosis not present

## 2023-06-04 DIAGNOSIS — R220 Localized swelling, mass and lump, head: Secondary | ICD-10-CM | POA: Diagnosis not present

## 2023-06-04 DIAGNOSIS — S0003XA Contusion of scalp, initial encounter: Secondary | ICD-10-CM | POA: Diagnosis not present

## 2023-06-04 MED ORDER — LIDOCAINE-EPINEPHRINE (PF) 2 %-1:200000 IJ SOLN
10.0000 mL | Freq: Once | INTRAMUSCULAR | Status: AC
  Administered 2023-06-04: 10 mL
  Filled 2023-06-04: qty 20

## 2023-06-04 NOTE — ED Triage Notes (Signed)
Pt lives with daughter at home, likes to walk around some at night. Lost her balance tonight and fell hitting head on a night stand. Hematoma and laceration noted to R side of head.

## 2023-06-04 NOTE — ED Provider Notes (Signed)
[place] EMERGENCY DEPARTMENT AT [facility]  Provider Note  CSN: [id number] Arrival date & time: 06/04/23 0343  History Chief Complaint  Patient presents with   Mary Higgins is a 82 y.o. female brought to the ED by daughter after she fell at home. She reports she tripped while walking in her bedroom, hitting her head on the night stand. Unknown LOC but at baseline on arrival here. She is not on anticoagulation.    Home Medications Prior to Admission medications   Medication Sig Start Date End Date Taking? Authorizing Provider  Artificial Tear Ointment (DRY EYES OP) Place 1 drop into both eyes daily as needed (dry eyes).    [provider]  Blood Glucose Monitoring Suppl (ONE TOUCH ULTRA 2) w/Device KIT USE UP TO FOUR TIMES DAILY AS DIRECTED 02/16/21   Sharon Seller, NP  clonazePAM (KLONOPIN) 1 MG tablet TAKE 1 TABLET(1 MG) BY MOUTH THREE TIMES DAILY AS NEEDED 04/24/23   Sharon Seller, NP  losartan (COZAAR) 100 MG tablet TAKE 1/2 TABLET BY MOUTH IN THE MORNING AND IN THE EVENING 02/23/23   Sharon Seller, NP  mirtazapine (REMERON) 15 MG tablet Take 1 tablet (15 mg total) by mouth at bedtime. 03/19/23   Sharon Seller, NP  pantoprazole (PROTONIX) 40 MG tablet TAKE 1 TABLET(40 MG) BY MOUTH DAILY 08/22/22   Sharon Seller, NP     Allergies    Oxaprozin   Review of Systems   Review of Systems Please see HPI for pertinent positives and negatives  Physical Exam BP (!) 146/82   Pulse 79   Temp (!) 97.5 F (36.4 C) (Oral)   Resp 16   LMP  (LMP Unknown)   SpO2 99%   Physical Exam Vitals and nursing note reviewed.  Constitutional:      Appearance: Normal appearance.  HENT:     Head: Normocephalic.     Comments: Laceration and hematoma to R parietal scalp    Nose: Nose normal.     Mouth/Throat:     Mouth: Mucous membranes are moist.  Eyes:     Extraocular Movements: Extraocular movements intact.     Conjunctiva/sclera:  Conjunctivae normal.  Cardiovascular:     Rate and Rhythm: Normal rate.  Pulmonary:     Effort: Pulmonary effort is normal.     Breath sounds: Normal breath sounds.  Chest:     Chest wall: No tenderness.  Abdominal:     General: Abdomen is flat.     Palpations: Abdomen is soft.     Tenderness: There is no abdominal tenderness.  Musculoskeletal:        General: No swelling or tenderness. Normal range of motion.     Cervical back: Neck supple.  Skin:    General: Skin is warm and dry.  Neurological:     General: No focal deficit present.     Mental Status: She is alert.  Psychiatric:        Mood and Affect: Mood normal.     ED Results / Procedures / Treatments   EKG None  Procedures .Marland KitchenLaceration Repair  Date/Time: 06/04/2023 6:05 AM  Performed by: Pollyann Savoy, MD Authorized by: Pollyann Savoy, MD   Consent:    Consent obtained:  Verbal   Consent given by:  Patient Anesthesia:    Anesthesia method:  Local infiltration   Local anesthetic:  Lidocaine 2% WITH epi Laceration details:    Location:  Scalp   Scalp location:  R parietal   Length (cm):  3 Pre-procedure details:    Preparation:  Patient was prepped and draped in usual sterile fashion Exploration:    Hemostasis achieved with:  Epinephrine   Imaging outcome: foreign body not noted     Wound exploration: entire depth of wound visualized   Treatment:    Area cleansed with:  Saline   Irrigation solution:  Sterile saline   Irrigation method:  Syringe Skin repair:    Repair method:  Staples   Number of staples:  3 Approximation:    Approximation:  Close Repair type:    Repair type:  Simple Post-procedure details:    Dressing:  Open (no dressing)   Procedure completion:  Tolerated well, no immediate complications   Medications Ordered in the ED Medications  lidocaine-EPINEPHrine (XYLOCAINE W/EPI) 2 %-1:200000 (PF) injection 10 mL (10 mLs Infiltration Given by Other 06/04/23 0544)    Initial  Impression and Plan  Patient here after a mechanical fall with a head injury and scalp laceration. Will send for CT. Wound repaired as above. Wound care and head injury instructions given. Staple removal in 7 days.  ED Course   Clinical Course as of 06/04/23 0606  Mon Jun 04, 2023  [id number] I personally viewed the images from radiology studies and agree with radiologist interpretation: CT neg for acute injury, VP shunt is stable.  [CS]    Clinical Course User Index [CS] Pollyann Savoy, MD     MDM Rules/Calculators/A&P Medical Decision Making Problems Addressed: Fall, initial encounter: acute illness or injury Injury of head, initial encounter: acute illness or injury Laceration of scalp, initial encounter: acute illness or injury  Amount and/or Complexity of Data Reviewed Radiology: ordered and independent interpretation performed. Decision-making details documented in ED Course.  Risk Prescription drug management.     Final Clinical Impression(s) / ED Diagnoses Final diagnoses:  Fall, initial encounter  Injury of head, initial encounter  Laceration of scalp, initial encounter    Rx / DC Orders ED Discharge Orders     None        Pollyann Savoy, MD 06/04/23 [phone]

## 2023-06-06 ENCOUNTER — Telehealth: Payer: Medicare HMO

## 2023-06-06 NOTE — Transitions of Care (Post Inpatient/ED Visit) (Signed)
   06/06/2023  Name: Mary Higgins MRN: [id number] DOB: 10/25/1941  Today's TOC FU Call Status: Today's TOC FU Call Status:: Unsuccessul Call (1st Attempt) Unsuccessful Call (1st Attempt) Date: 06/05/21  Attempted to reach the patient regarding the most recent Inpatient/ED visit.  Follow Up Plan: Additional outreach attempts will be made to reach the patient to complete the Transitions of Care (Post Inpatient/ED visit) call.   Signature : Guss Bunde [facility]

## 2023-06-07 ENCOUNTER — Other Ambulatory Visit: Payer: Self-pay | Admitting: Nurse Practitioner

## 2023-06-11 ENCOUNTER — Ambulatory Visit (INDEPENDENT_AMBULATORY_CARE_PROVIDER_SITE_OTHER): Payer: Medicare HMO | Admitting: Nurse Practitioner

## 2023-06-11 ENCOUNTER — Encounter: Payer: Self-pay | Admitting: Neurology

## 2023-06-11 ENCOUNTER — Encounter: Payer: Self-pay | Admitting: Nurse Practitioner

## 2023-06-11 ENCOUNTER — Ambulatory Visit (INDEPENDENT_AMBULATORY_CARE_PROVIDER_SITE_OTHER): Payer: Medicare HMO | Admitting: Neurology

## 2023-06-11 VITALS — BP 110/60 | HR 60 | Ht 71.0 in | Wt 127.0 lb

## 2023-06-11 VITALS — BP 118/72 | HR 80 | Temp 97.3°F | Ht 71.0 in | Wt 127.0 lb

## 2023-06-11 DIAGNOSIS — R519 Headache, unspecified: Secondary | ICD-10-CM

## 2023-06-11 DIAGNOSIS — S0101XD Laceration without foreign body of scalp, subsequent encounter: Secondary | ICD-10-CM | POA: Diagnosis not present

## 2023-06-11 DIAGNOSIS — R2681 Unsteadiness on feet: Secondary | ICD-10-CM | POA: Diagnosis not present

## 2023-06-11 DIAGNOSIS — R569 Unspecified convulsions: Secondary | ICD-10-CM

## 2023-06-11 DIAGNOSIS — E1169 Type 2 diabetes mellitus with other specified complication: Secondary | ICD-10-CM | POA: Diagnosis not present

## 2023-06-11 DIAGNOSIS — F0390 Unspecified dementia without behavioral disturbance: Secondary | ICD-10-CM | POA: Diagnosis not present

## 2023-06-11 DIAGNOSIS — R634 Abnormal weight loss: Secondary | ICD-10-CM | POA: Diagnosis not present

## 2023-06-11 DIAGNOSIS — R296 Repeated falls: Secondary | ICD-10-CM | POA: Diagnosis not present

## 2023-06-11 DIAGNOSIS — E1142 Type 2 diabetes mellitus with diabetic polyneuropathy: Secondary | ICD-10-CM | POA: Diagnosis not present

## 2023-06-11 DIAGNOSIS — S065XAA Traumatic subdural hemorrhage with loss of consciousness status unknown, initial encounter: Secondary | ICD-10-CM | POA: Diagnosis not present

## 2023-06-11 MED ORDER — GABAPENTIN 100 MG PO CAPS: 100.0000 mg | ORAL_CAPSULE | Freq: Every day | ORAL | 5 refills | Status: AC

## 2023-06-11 NOTE — Progress Notes (Signed)
NEUROLOGY CONSULTATION NOTE  Mary Higgins MRN: [id number] DOB: 1941/09/23  Referring provider: Abbey Chatters, NP Primary care provider: Abbey Chatters, NP  Reason for consult:  headaches  Assessment/Plan:   Chronic daily headaches, posttraumatic/tension-type complicated by medication overuse. Gait instability/frequent falls.  Likely secondary to diabetic polyneuropathy and deconditioning History of symptomatic seizure in setting of clonazepam withdrawal, no recurrence Major neurocognitive disorder History of traumatic subdural hematoma x 2 History of cerebral aneurysm coiling   For daily headaches: Start gabapentin 100mg  at bedtime.  We can increase to 200mg  at bedtime in 4 weeks if needed. Limit use of pain relievers to no more than 2 days out of week to prevent risk of rebound or medication-overuse headache. Refer to physical therapy for unsteady gait.  Advised that she must continue exercises once discharged or she will decompensate 24 hour supervision Follow up 5 months.   Subjective:  Mary Higgins is an 82 year old female with DM II, HTN, HLD, COPD, Fuch's corneal dystrophy, cerebral meningioma, pacemaker, and history of breast cancer, traumatic SDH s/p, VP shunt and cerebral aneurysm rupture s/p coiling who presents for headache.  History supplemented by her accompanying daughter and referring provider's note.   In 2000, had non-traumatic intracerebral hemorrhage due to aneurysm rupture treated with coiling.    She has history of recurrent falls.  In September 2022, had an episode of syncope in which she fell and struck her head.  Afterwards, noted headache and slowed speech.  Went to ED where she was found to have a 2.3 cm SDH with 9 mm midline shift. Requiring burr hole.  SDH was monitored and found to be increasing in size, so underwent embolization of the right middle meningeal artery in November 2022.  In November 2023, she had a syncopal episode where  she was unresponsive with right arm shaking for several minutes in setting of diarrhea for several months.  She takes clonazepam daily for blepharospasm and had been out for several days.  CT head showed left VP shunt with stable ventricular size and stable bilateral chronic SDH 7mm on right and 5 mm on left.  EEG within normal limits.  No other similar episodes but continues to have falls.    She had a fall on 06/04/2023 and struck her head.  Seen in ED. CT head again showed left VP sunt with slightly increased ventricular volume and decrease chronic subdural hygromas compared to Nov 2023 and calcified left paramedian vertex meningioma.    Since the SDH in 2022, she has daily headaches, described as a diffuse aching.  She has associated photophobia but not nausea or phonophobia.  She normally takes acetaminophen or ibuprofen.  Takes it everyday.    She lives with her daughter who is her caregiver.  She is dependent for ADLs, including managing medications.  Sometimes she has left the stove on while boiling eggs.  Short term memory problems.  She is usually sedentary.  Gait seems to improve after PT but declines after she finishes.   Current NSAIDS/analgesics:  acetaminophen, ibuprofen Current Antihypertensive medications:  losartan Current Antidepressant medications:  mirtazapine 15mg  QHS Other medications:  clonazepam (for blepharospasm)  PAST MEDICAL HISTORY: Past Medical History:  Diagnosis Date   Anxiety and depression    Hx of   Blepharospasm     Breast cancer (HCC) 1987   s/p lumpectomy.   Carotid bruit    Bilateral. Mild to moderate disease on LEFT and mild on the RIGHT in 2012  COPD (chronic obstructive pulmonary disease) (HCC)    mild. Not on inhalers.   Diabetes mellitus without complication (HCC)    AODM   Fuchs' corneal dystrophy    GERD (gastroesophageal reflux disease)     Headache    Hyperlipidemia    Hypertension     Insomnia     Intracerebral bleed (HCC) 2003    Remote intracerebral blled and coiling by Dr. Corliss Skains for cerebral aneurysm and a shunt placed by Dr. Venetia Maxon remotely. No seizure disorder and this happened in 2003 with an anterior communicating aneurysm.   Meningioma (HCC)    left parafalcine calcified meningioma   Neurocardiogenic syncope    a. s/p Biotronik dual chamber pacemaker   Pacemaker    Personal history of radiation therapy    S/P VP shunt 2003   Sinus node dysfunction (HCC)     Tobacco abuse     Type II or unspecified type diabetes mellitus without mention of complication, not stated as uncontrolled      PAST SURGICAL HISTORY: Past Surgical History:  Procedure Laterality Date   ANEURYSM COILING  11/13/2001   Cerebral aneurysm ;Dr. Julieanne Cotton   BREAST LUMPECTOMY Right    For cancer, no recurrence   CEREBRAL ANEURYSM REPAIR  2023   CHOLECYSTECTOMY     CRANIOTOMY Right 08/04/2021   Procedure: BURR HOLES FOR SUBDURAL HEMATOMA;  Surgeon: Jadene Pierini, MD;  Location: [facility] OR;  Service: Neurosurgery;  Laterality: Right;   IR ANGIO INTRA EXTRACRAN SEL INTERNAL CAROTID UNI R MOD SED  09/30/2021   IR ANGIO VERTEBRAL SEL VERTEBRAL UNI R MOD SED  09/30/2021   IR TRANSCATH/EMBOLIZ  09/30/2021   PACEMAKER GENERATOR CHANGE N/A 09/25/2014   BTK dual chamber pacemkaer implanted by Dr Johney Frame   PACEMAKER INSERTION  07/12/2006   St. Jude Victory XL DR  -  Dr. Jenne Campus   PARTIAL HYSTERECTOMY     RADIOLOGY WITH ANESTHESIA N/A 09/30/2021   Procedure: Arteriogram, Onyx embolization of right middle meningeal artery;  Surgeon: Lisbeth Renshaw, MD;  Location: [facility];  Service: Radiology;  Laterality: N/A;   VENTRICULOPERITONEAL SHUNT  11/13/2001   Dr. Maeola Harman    MEDICATIONS: Current Outpatient Medications on File Prior to Visit  Medication Sig Dispense Refill   Artificial Tear Ointment (DRY EYES OP) Place 1 drop into both eyes daily as needed (dry eyes).     Blood Glucose Monitoring Suppl (ONE TOUCH ULTRA 2) w/Device  KIT USE UP TO FOUR TIMES DAILY AS DIRECTED 1 kit 0   clonazePAM (KLONOPIN) 1 MG tablet TAKE 1 TABLET(1 MG) BY MOUTH THREE TIMES DAILY AS NEEDED 90 tablet 1   losartan (COZAAR) 100 MG tablet TAKE 1/2 TABLET BY MOUTH IN THE MORNING AND IN THE EVENING 90 tablet 1   mirtazapine (REMERON) 15 MG tablet TAKE 1 TABLET(15 MG) BY MOUTH AT BEDTIME 90 tablet 0   pantoprazole (PROTONIX) 40 MG tablet TAKE 1 TABLET(40 MG) BY MOUTH DAILY 90 tablet 3   No current facility-administered medications on file prior to visit.    ALLERGIES: Allergies  Allergen Reactions   Oxaprozin Rash    FAMILY HISTORY: Family History  Problem Relation Age of Onset   Cancer Mother    Heart attack Father    Heart attack Son        Heart Pump placed    Diabetes Son    Hypertension Other     Objective:  Blood pressure 110/60, pulse 60, height 5\' 11"  (1.803 m), weight 127  lb (57.6 kg), SpO2 97%. General: No acute distress.  Patient appears well-groomed.   Head:  Normocephalic/atraumatic Eyes:  fundi examined but not visualized Neck: supple, no paraspinal tenderness, full range of motion Back: No paraspinal tenderness Heart: regular rate and rhythm Mental status: alert and oriented to person, place, and time, speech fluent and not dysarthric, language intact. Cranial nerves: CN I: not tested CN II: pupils equal, round and reactive to light, visual fields intact CN III, IV, VI:  full range of motion, no nystagmus, no ptosis CN V: facial sensation intact. CN VII: upper and lower face symmetric CN VIII: hearing decreased bilaterally CN IX, X: gag intact, uvula midline CN XI: sternocleidomastoid and trapezius muscles intact CN XII: tongue midline Bulk & Tone: normal, no fasciculations. Motor:  muscle strength 4+/5 right hip flexion and 5-/5 left hip flexion, otherwise 5/5 throughout Sensation:  Pinprick sensation intact.  Vibratory and proprioception sensation reduced in feet. Deep Tendon Reflexes:  2+ throughout,   toes downgoing.   Finger to nose testing:  Without dysmetria.   Gait:  Cautious wide-based gait.  En bloc turn.  Upright posture.  Uses cane to assist ambulate.    Thank you for allowing me to take part in the care of this patient.  Shon Millet, DO  CC: Abbey Chatters, NP

## 2023-06-11 NOTE — Progress Notes (Signed)
Careteam: Patient Care Team: Sharon Seller, NP as PCP - General (Geriatric Medicine) Marily Lente, NP (Inactive) as Nurse Practitioner (Cardiology) Mia Creek, MD as Consulting Physician (Ophthalmology) Hillis Range, MD (Inactive) as Consulting Physician (Cardiology) Jill Side, OD as Referring Physician (Ophthalmology)  PLACE OF SERVICE:  [facility]  Advanced Directive information    Allergies  Allergen Reactions   Oxaprozin Rash    Chief Complaint  Patient presents with   Acute Visit    Walk-in, request to examine staples from head and remove if area healed. High fall risk Here with daughter      HPI: Patient is a 82 y.o. female to follow up after fall and needs suture removes.  In the middle of the night feel backward and hit her head. Caused a laceration so daughter took her to the Ed. She got 3 staples.  Had follow up with neurologist today and starting her on gabapentin to help with headaches and neuropathy.  Referral sent for PT and encouraged her to stick with it to help with weakness.  Thought neurologist office would remove staples today but they declined and sent her here.  No drainage, pain, tenderness, warmth noted.   Reports she is eating better.  Drinking ensure when she has it   Review of Systems:  Review of Systems  Constitutional:  Negative for chills, fever and weight loss.  HENT:  Negative for tinnitus.   Respiratory:  Negative for cough, sputum production and shortness of breath.   Cardiovascular:  Negative for chest pain, palpitations and leg swelling.  Gastrointestinal:  Negative for abdominal pain, constipation, diarrhea and heartburn.  Genitourinary:  Negative for dysuria, frequency and urgency.  Musculoskeletal:  Positive for falls. Negative for back pain, joint pain and myalgias.  Skin: Negative.   Neurological:  Positive for weakness and headaches. Negative for dizziness.  Psychiatric/Behavioral:  Negative for  depression and memory loss. The patient does not have insomnia.     Past Medical History:  Diagnosis Date   Anxiety and depression    Hx of   Blepharospasm     Breast cancer (HCC) 1987   s/p lumpectomy.   Carotid bruit    Bilateral. Mild to moderate disease on LEFT and mild on the RIGHT in 2012   COPD (chronic obstructive pulmonary disease) (HCC)    mild. Not on inhalers.   Diabetes mellitus without complication (HCC)    AODM   Fuchs' corneal dystrophy    GERD (gastroesophageal reflux disease)     Headache    Hyperlipidemia    Hypertension     Insomnia     Intracerebral bleed (HCC) 2003   Remote intracerebral blled and coiling by Dr. Corliss Skains for cerebral aneurysm and a shunt placed by Dr. Venetia Maxon remotely. No seizure disorder and this happened in 2003 with an anterior communicating aneurysm.   Meningioma (HCC)    left parafalcine calcified meningioma   Neurocardiogenic syncope    a. s/p Biotronik dual chamber pacemaker   Pacemaker    Personal history of radiation therapy    S/P VP shunt 2003   Sinus node dysfunction (HCC)     Tobacco abuse     Type II or unspecified type diabetes mellitus without mention of complication, not stated as uncontrolled     Past Surgical History:  Procedure Laterality Date   ANEURYSM COILING  11/13/2001   Cerebral aneurysm ;Dr. Julieanne Cotton   BREAST LUMPECTOMY Right    For cancer, no recurrence  CEREBRAL ANEURYSM REPAIR  2023   CHOLECYSTECTOMY     CRANIOTOMY Right 08/04/2021   Procedure: BURR HOLES FOR SUBDURAL HEMATOMA;  Surgeon: Jadene Pierini, MD;  Location: [facility] OR;  Service: Neurosurgery;  Laterality: Right;   IR ANGIO INTRA EXTRACRAN SEL INTERNAL CAROTID UNI R MOD SED  09/30/2021   IR ANGIO VERTEBRAL SEL VERTEBRAL UNI R MOD SED  09/30/2021   IR TRANSCATH/EMBOLIZ  09/30/2021   PACEMAKER GENERATOR CHANGE N/A 09/25/2014   BTK dual chamber pacemkaer implanted by Dr Johney Frame   PACEMAKER INSERTION  07/12/2006   St. Jude Victory XL  DR  -  Dr. Jenne Campus   PARTIAL HYSTERECTOMY     RADIOLOGY WITH ANESTHESIA N/A 09/30/2021   Procedure: Arteriogram, Onyx embolization of right middle meningeal artery;  Surgeon: Lisbeth Renshaw, MD;  Location: [facility] OR;  Service: Radiology;  Laterality: N/A;   VENTRICULOPERITONEAL SHUNT  11/13/2001   Dr. Maeola Harman   Social History:   reports that she has been smoking cigarettes. She started smoking about 4 years ago. She has never used smokeless tobacco. She reports that she does not drink alcohol and does not use drugs.  Family History  Problem Relation Age of Onset   Cancer Mother    Heart attack Father    Heart attack Son        Heart Pump placed    Diabetes Son    Hypertension Other     Medications: Patient's Medications  New Prescriptions   No medications on file  Previous Medications   ARTIFICIAL TEAR OINTMENT (DRY EYES OP)    Place 1 drop into both eyes daily as needed (dry eyes).   BLOOD GLUCOSE MONITORING SUPPL (ONE TOUCH ULTRA 2) W/DEVICE KIT    USE UP TO FOUR TIMES DAILY AS DIRECTED   CLONAZEPAM (KLONOPIN) 1 MG TABLET    TAKE 1 TABLET(1 MG) BY MOUTH THREE TIMES DAILY AS NEEDED   GABAPENTIN (NEURONTIN) 100 MG CAPSULE    Take 1 capsule (100 mg total) by mouth at bedtime.   LOSARTAN (COZAAR) 100 MG TABLET    TAKE 1/2 TABLET BY MOUTH IN THE MORNING AND IN THE EVENING   MIRTAZAPINE (REMERON) 15 MG TABLET    TAKE 1 TABLET(15 MG) BY MOUTH AT BEDTIME   PANTOPRAZOLE (PROTONIX) 40 MG TABLET    TAKE 1 TABLET(40 MG) BY MOUTH DAILY  Modified Medications   No medications on file  Discontinued Medications   No medications on file    Physical Exam:  Vitals:   06/11/23 1609  Temp: (!) 97.3 F (36.3 C)  TempSrc: Temporal  Weight: 127 lb (57.6 kg)  Height: 5\' 11"  (1.803 m)   Body mass index is 17.71 kg/m. Wt Readings from Last 3 Encounters:  06/11/23 127 lb (57.6 kg)  06/11/23 127 lb (57.6 kg)  03/19/23 128 lb (58.1 kg)    Physical Exam Constitutional:      General:  She is not in acute distress.    Appearance: She is well-developed. She is not diaphoretic.  HENT:     Head: Normocephalic and atraumatic.     Mouth/Throat:     Pharynx: No oropharyngeal exudate.  Eyes:     Conjunctiva/sclera: Conjunctivae normal.     Pupils: Pupils are equal, round, and reactive to light.  Cardiovascular:     Rate and Rhythm: Normal rate and regular rhythm.     Heart sounds: Normal heart sounds.  Pulmonary:     Effort: Pulmonary effort is normal.  Breath sounds: Normal breath sounds.  Abdominal:     General: Bowel sounds are normal.     Palpations: Abdomen is soft.  Musculoskeletal:     Cervical back: Normal range of motion and neck supple.     Right lower leg: No edema.     Left lower leg: No edema.  Skin:    General: Skin is warm and dry.     Comments: Well healing incision to scalp. 3 staples noted.   Neurological:     Mental Status: She is alert. Mental status is at baseline.     Motor: Weakness present.     Gait: Gait abnormal.  Psychiatric:        Mood and Affect: Mood normal.     Labs reviewed: Basic Metabolic Panel: Recent Labs    08/02/22 1025 09/12/22 1500 09/20/22 2228 09/21/22 0207 09/21/22 0407 02/05/23 1329  NA 139   < > 137  --  136 135  K 4.6   < > 4.0  --  3.3* 4.2  CL 101   < > 106  --  102 99  CO2 28   < > 25  --  24 26  GLUCOSE 135   < > 158*  --  102* 96  BUN 12   < > 9  --  7* 13  CREATININE 0.91   < > 0.73  --  0.66 0.80  CALCIUM 9.8   < > 9.0  --  9.0 9.4  MG  --   --   --  1.8 2.0  --   PHOS  --   --   --  2.8 3.0  --   TSH 1.30  --   --  1.538  --   --    < > = values in this interval not displayed.   Liver Function Tests: Recent Labs    09/12/22 1500 09/20/22 2228 09/21/22 0407 02/05/23 1329  AST 10* 10* 13* 9*  ALT 8 6 10  5*  ALKPHOS 29* 32* 34*  --   BILITOT 0.7 1.1 1.0 0.7  PROT 5.6* 5.8* 6.3* 6.4  ALBUMIN 3.5 3.6 3.9  --    No results for input(s): "LIPASE", "AMYLASE" in the last 8760 hours. No  results for input(s): "AMMONIA" in the last 8760 hours. CBC: Recent Labs    09/12/22 1500 09/12/22 1517 09/20/22 2228 09/21/22 0407 02/05/23 1329  WBC 4.3  --  8.2 8.1 4.7  NEUTROABS 2.0  --   --  4.5 3,013  HGB 11.9*   < > 12.2 12.2 12.4  HCT 36.4   < > 36.1 36.8 36.3  MCV 93.6  --  91.9 91.8 88.8  PLT 204  --  224 243 270   < > = values in this interval not displayed.   Lipid Panel: No results for input(s): "CHOL", "HDL", "LDLCALC", "TRIG", "CHOLHDL", "LDLDIRECT" in the last 8760 hours. TSH: Recent Labs    08/02/22 1025 09/21/22 0207  TSH 1.30 1.538   A1C: Lab Results  Component Value Date   HGBA1C 6.1 (H) 02/05/2023     Assessment/Plan 1. Type 2 diabetes mellitus with other specified complication, without long-term current use of insulin (HCC) She is off metformin at this time Does not check blood sugars at home - follow up Hemoglobin A1c  2. Laceration of scalp, subsequent encounter -removed 3 staples, laceration healing well. Care instructions given   3. Weight loss, abnormal -stable since May, continue ensures daily encouraged to  liberalize diet. To have protein supplement in addition to smallest meal of the day   Return in about 3 months (around 09/11/2023) for routine follow up.  Janene Harvey. Biagio Borg [facility] [phone]

## 2023-06-11 NOTE — Patient Instructions (Addendum)
For daily headaches: Start gabapentin 100mg  at bedtime.  If no improvement in 4 weeks, contact me and we can increase dose Limit use of pain relievers to no more than 2 days out of week to prevent risk of rebound or medication-overuse headache. Refer to physical therapy for unsteady gait.  You must do exercises at home as well Follow up 5 months.

## 2023-06-14 NOTE — Progress Notes (Signed)
Remote pacemaker transmission.   

## 2023-06-22 ENCOUNTER — Other Ambulatory Visit: Payer: Self-pay | Admitting: Nurse Practitioner

## 2023-06-22 DIAGNOSIS — G245 Blepharospasm: Secondary | ICD-10-CM

## 2023-06-22 NOTE — Telephone Encounter (Signed)
Patient is requesting a refill of the following medications: Requested Prescriptions   Pending Prescriptions Disp Refills   clonazePAM (KLONOPIN) 1 MG tablet [Pharmacy Med Name: CLONAZEPAM 1MG  TABLETS] 90 tablet     Sig: TAKE 1 TABLET(1 MG) BY MOUTH THREE TIMES DAILY AS NEEDED    Date of last refill: 04/24/2023  Refill amount: 1  Treatment agreement date:  12/05/2021

## 2023-08-17 ENCOUNTER — Other Ambulatory Visit: Payer: Self-pay | Admitting: Nurse Practitioner

## 2023-08-22 ENCOUNTER — Other Ambulatory Visit: Payer: Self-pay | Admitting: Nurse Practitioner

## 2023-08-22 DIAGNOSIS — G245 Blepharospasm: Secondary | ICD-10-CM

## 2023-08-22 NOTE — Telephone Encounter (Signed)
Patient has request refill on medication Clonazepam 1mg . Patient medication last refilled 06/22/2023. Patient medication pend and sent to PCP Janyth Contes Janene Harvey, NP for approval.

## 2023-08-22 NOTE — Telephone Encounter (Signed)
MyChart message sent to patient.

## 2023-08-22 NOTE — Telephone Encounter (Signed)
Needs to schedule follow up prior to any additional refills

## 2023-08-30 ENCOUNTER — Ambulatory Visit (INDEPENDENT_AMBULATORY_CARE_PROVIDER_SITE_OTHER): Payer: Medicare HMO

## 2023-08-30 DIAGNOSIS — I495 Sick sinus syndrome: Secondary | ICD-10-CM

## 2023-08-30 LAB — CUP PACEART REMOTE DEVICE CHECK
Date Time Interrogation Session: 20241017073146
Implantable Lead Connection Status: 753985
Implantable Lead Connection Status: 753985
Implantable Lead Implant Date: 20070830
Implantable Lead Implant Date: 20070830
Implantable Lead Location: 753859
Implantable Lead Location: 753860
Implantable Pulse Generator Implant Date: 20151113
Pulse Gen Model: 394931
Pulse Gen Serial Number: 68408118

## 2023-09-12 ENCOUNTER — Other Ambulatory Visit: Payer: Self-pay | Admitting: Nurse Practitioner

## 2023-09-18 NOTE — Progress Notes (Signed)
Remote pacemaker transmission.   

## 2023-09-20 ENCOUNTER — Other Ambulatory Visit: Payer: Self-pay | Admitting: Nurse Practitioner

## 2023-09-20 DIAGNOSIS — G245 Blepharospasm: Secondary | ICD-10-CM

## 2023-09-20 NOTE — Telephone Encounter (Signed)
Needs appt prior to additional refills.

## 2023-09-20 NOTE — Telephone Encounter (Signed)
Please see previous phone note for today.

## 2023-09-20 NOTE — Telephone Encounter (Signed)
Patient has request refill on medication Clonazepam 1mg . Patient medication last refilled 08/22/2023. Patient has Non Opioid Contract on file dated 12/08/2021. Update contract added to upcoming appointment note 02/15/2024. Medication pend and sent to PCP Janyth Contes Janene Harvey, NP

## 2023-09-27 ENCOUNTER — Encounter (INDEPENDENT_AMBULATORY_CARE_PROVIDER_SITE_OTHER): Payer: Medicare HMO | Admitting: Family

## 2023-10-03 NOTE — Progress Notes (Signed)
  This encounter was created in error - please disregard. No show 

## 2023-10-19 ENCOUNTER — Other Ambulatory Visit: Payer: Self-pay | Admitting: Nurse Practitioner

## 2023-10-19 DIAGNOSIS — G245 Blepharospasm: Secondary | ICD-10-CM

## 2023-10-19 NOTE — Telephone Encounter (Signed)
Patient is requesting a refill of the following medications: Requested Prescriptions   Pending Prescriptions Disp Refills   clonazePAM (KLONOPIN) 1 MG tablet [Pharmacy Med Name: CLONAZEPAM 1MG  TABLETS] 90 tablet     Sig: TAKE 1 TABLET BY MOUTH THREE TIMES DAILY    Date of last refill: 09/20/23  Refill amount: 90  Treatment agreement date: notation made on pending appointment for April 2024 to update.

## 2023-10-29 ENCOUNTER — Telehealth: Payer: Medicare HMO | Admitting: Nurse Practitioner

## 2023-10-29 ENCOUNTER — Encounter: Payer: Self-pay | Admitting: Nurse Practitioner

## 2023-10-29 DIAGNOSIS — G4700 Insomnia, unspecified: Secondary | ICD-10-CM | POA: Diagnosis not present

## 2023-10-29 DIAGNOSIS — E1169 Type 2 diabetes mellitus with other specified complication: Secondary | ICD-10-CM | POA: Diagnosis not present

## 2023-10-29 DIAGNOSIS — F324 Major depressive disorder, single episode, in partial remission: Secondary | ICD-10-CM

## 2023-10-29 DIAGNOSIS — G245 Blepharospasm: Secondary | ICD-10-CM

## 2023-10-29 DIAGNOSIS — R634 Abnormal weight loss: Secondary | ICD-10-CM | POA: Diagnosis not present

## 2023-10-29 DIAGNOSIS — I10 Essential (primary) hypertension: Secondary | ICD-10-CM | POA: Diagnosis not present

## 2023-10-29 NOTE — Progress Notes (Signed)
Careteam: Patient Care Team: Sharon Seller, NP as PCP - General (Geriatric Medicine) Marily Lente, NP (Inactive) as Nurse Practitioner (Cardiology) Mia Creek, MD as Consulting Physician (Ophthalmology) Hillis Range, MD (Inactive) as Consulting Physician (Cardiology) Jill Side, OD as Referring Physician (Ophthalmology)  Advanced Directive information    Allergies  Allergen Reactions   Oxaprozin Rash    Chief Complaint  Patient presents with   Medical Management of Chronic Issues    Patient presents today for 5 month follow-up   Quality Metric Gaps    Eye exam, Flu, COVID#6     HPI: Patient is a 82 y.o. female for routine follow up.  Reports overall she is doing well.   Does not check blood pressure at home.   Has not weighed herself recently. She know that she lost quite a bit. She is eating better. She did not have an appetite but now it is better- on remeron at bedtime.   No anxiety or depression.   GERD- controlled on protonix.   No constipation  Reports occasional diarrhea- had a loose stool today but only 1  Blepharospasm- on klonopin 1 mg TID. Reports her eyes bother her all the time  Review of Systems:  Review of Systems  Constitutional:  Negative for chills, fever and weight loss.  HENT:  Negative for tinnitus.   Respiratory:  Negative for cough, sputum production and shortness of breath.   Cardiovascular:  Negative for chest pain, palpitations and leg swelling.  Gastrointestinal:  Negative for abdominal pain, constipation, diarrhea and heartburn.  Genitourinary:  Negative for dysuria, frequency and urgency.  Musculoskeletal:  Negative for back pain, falls, joint pain and myalgias.  Skin: Negative.   Neurological:  Negative for dizziness and headaches.  Psychiatric/Behavioral:  Negative for depression and memory loss. The patient does not have insomnia.     Past Medical History:  Diagnosis Date   Anxiety and depression    Hx of    Blepharospasm     Breast cancer (HCC) 1987   s/p lumpectomy.   Carotid bruit    Bilateral. Mild to moderate disease on LEFT and mild on the RIGHT in 2012   COPD (chronic obstructive pulmonary disease) (HCC)    mild. Not on inhalers.   Diabetes mellitus without complication (HCC)    AODM   Fuchs' corneal dystrophy    GERD (gastroesophageal reflux disease)     Headache    Hyperlipidemia    Hypertension     Insomnia     Intracerebral bleed (HCC) 2003   Remote intracerebral blled and coiling by Dr. Corliss Skains for cerebral aneurysm and a shunt placed by Dr. Venetia Maxon remotely. No seizure disorder and this happened in 2003 with an anterior communicating aneurysm.   Meningioma (HCC)    left parafalcine calcified meningioma   Neurocardiogenic syncope    a. s/p Biotronik dual chamber pacemaker   Pacemaker    Personal history of radiation therapy    S/P VP shunt 2003   Sinus node dysfunction (HCC)     Tobacco abuse     Type II or unspecified type diabetes mellitus without mention of complication, not stated as uncontrolled     Past Surgical History:  Procedure Laterality Date   ANEURYSM COILING  11/13/2001   Cerebral aneurysm ;Dr. Julieanne Cotton   BREAST LUMPECTOMY Right    For cancer, no recurrence   CEREBRAL ANEURYSM REPAIR  2023   CHOLECYSTECTOMY     CRANIOTOMY Right 08/04/2021  Procedure: BURR HOLES FOR SUBDURAL HEMATOMA;  Surgeon: Jadene Pierini, MD;  Location: [facility] OR;  Service: Neurosurgery;  Laterality: Right;   IR ANGIO INTRA EXTRACRAN SEL INTERNAL CAROTID UNI R MOD SED  09/30/2021   IR ANGIO VERTEBRAL SEL VERTEBRAL UNI R MOD SED  09/30/2021   IR TRANSCATH/EMBOLIZ  09/30/2021   PACEMAKER GENERATOR CHANGE N/A 09/25/2014   BTK dual chamber pacemkaer implanted by Dr Johney Frame   PACEMAKER INSERTION  07/12/2006   [employer]. Jude Victory XL DR  -  Dr. Jenne Campus   PARTIAL HYSTERECTOMY     RADIOLOGY WITH ANESTHESIA N/A 09/30/2021   Procedure: Arteriogram, Onyx embolization of right  middle meningeal artery;  Surgeon: Lisbeth Renshaw, MD;  Location: [facility] OR;  Service: Radiology;  Laterality: N/A;   VENTRICULOPERITONEAL SHUNT  11/13/2001   Dr. Maeola Harman   Social History:   reports that she has been smoking cigarettes. She started smoking about 4 years ago. She has never used smokeless tobacco. She reports that she does not drink alcohol and does not use drugs.  Family History  Problem Relation Age of Onset   Cancer Mother    Heart attack Father    Heart attack Son        Heart Pump placed    Diabetes Son    Hypertension Other     Medications: Patient's Medications  New Prescriptions   No medications on file  Previous Medications   ARTIFICIAL TEAR OINTMENT (DRY EYES OP)    Place 1 drop into both eyes daily as needed (dry eyes).   BLOOD GLUCOSE MONITORING SUPPL (ONE TOUCH ULTRA 2) W/DEVICE KIT    USE UP TO FOUR TIMES DAILY AS DIRECTED   CLONAZEPAM (KLONOPIN) 1 MG TABLET    TAKE 1 TABLET BY MOUTH THREE TIMES DAILY   GABAPENTIN (NEURONTIN) 100 MG CAPSULE    Take 1 capsule (100 mg total) by mouth at bedtime.   LOSARTAN (COZAAR) 100 MG TABLET    TAKE 1/2 TABLET BY MOUTH IN THE MORNING AND IN THE EVENING   MIRTAZAPINE (REMERON) 15 MG TABLET    TAKE 1 TABLET(15 MG) BY MOUTH AT BEDTIME   PANTOPRAZOLE (PROTONIX) 40 MG TABLET    TAKE 1 TABLET(40 MG) BY MOUTH DAILY  Modified Medications   No medications on file  Discontinued Medications   No medications on file    Physical Exam:  There were no vitals filed for this visit. There is no height or weight on file to calculate BMI. Wt Readings from Last 3 Encounters:  06/11/23 127 lb (57.6 kg)  06/11/23 127 lb (57.6 kg)  03/19/23 128 lb (58.1 kg)    Physical Exam Constitutional:      General: She is not in acute distress.    Appearance: She is well-developed. She is not diaphoretic.  HENT:     Head: Normocephalic and atraumatic.     Mouth/Throat:     Pharynx: No oropharyngeal exudate.  Eyes:      Conjunctiva/sclera: Conjunctivae normal.     Pupils: Pupils are equal, round, and reactive to light.  Cardiovascular:     Rate and Rhythm: Normal rate and regular rhythm.     Heart sounds: Normal heart sounds.  Pulmonary:     Effort: Pulmonary effort is normal.     Breath sounds: Normal breath sounds.  Abdominal:     General: Bowel sounds are normal.     Palpations: Abdomen is soft.  Musculoskeletal:     Cervical back: Normal range of  motion and neck supple.     Right lower leg: No edema.     Left lower leg: No edema.  Skin:    General: Skin is warm and dry.  Neurological:     Mental Status: She is alert.  Psychiatric:        Mood and Affect: Mood normal.     Labs reviewed: Basic Metabolic Panel: Recent Labs    02/05/23 1329  NA 135  K 4.2  CL 99  CO2 26  GLUCOSE 96  BUN 13  CREATININE 0.80  CALCIUM 9.4   Liver Function Tests: Recent Labs    02/05/23 1329  AST 9*  ALT 5*  BILITOT 0.7  PROT 6.4   No results for input(s): "LIPASE", "AMYLASE" in the last 8760 hours. No results for input(s): "AMMONIA" in the last 8760 hours. CBC: Recent Labs    02/05/23 1329  WBC 4.7  NEUTROABS 3,013  HGB 12.4  HCT 36.3  MCV 88.8  PLT 270   Lipid Panel: No results for input(s): "CHOL", "HDL", "LDLCALC", "TRIG", "CHOLHDL", "LDLDIRECT" in the last 8760 hours. TSH: No results for input(s): "TSH" in the last 8760 hours. A1C: Lab Results  Component Value Date   HGBA1C 6.2 (H) 06/11/2023     Assessment/Plan 1. Hypertension, unspecified type (Primary) Does not check blood pressure at home.  - COMPLETE METABOLIC PANEL WITH GFR; Future - CBC with Differential/Platelet; Future  2. Type 2 diabetes mellitus with other specified complication, without long-term current use of insulin (HCC) -diet controlled, continue routine foot care/monitoring and to keep up with diabetic eye exams through ophthalmology  - Hemoglobin A1c; Future  3. Blepharospasm Continues on klonopin  1 mg TID, discuss GDR with using a half tablet vs whole if able to tolerate  4. Major depressive disorder with single episode, in partial remission (HCC) [place] at this time, continues on remeron 15 mg qhs  5. Insomnia, unspecified type Stable on remeron.   6. Weight loss, abnormal -she does not weight at home. encouraged to liberalize diet. To have protein supplement in addition to smallest meal of the day. Will need to get weight when in office    Next appt: in 3 months in office, she will come into office next week for blood work [employer]. Biagio Borg  [facility] [phone]    Virtual Visit via video  I connected with patient on 10/29/23 at  1:40 PM EST by mychart and verified that I am speaking with the correct person using two identifiers.  Location: Patient: home Provider: in office   I discussed the limitations, risks, security and privacy concerns of performing an evaluation and management service by telephone and the availability of in person appointments. I also discussed with the patient that there may be a patient responsible charge related to this service. The patient expressed understanding and agreed to proceed.   I discussed the assessment and treatment plan with the patient. The patient was provided an opportunity to ask questions and all were answered. The patient agreed with the plan and demonstrated an understanding of the instructions.   The patient was advised to call back or seek an in-person evaluation if the symptoms worsen or if the condition fails to improve as anticipated.  I provided  minutes of non-face-to-face time during this encounter.  Janene Harvey. Biagio Borg [employer] printed and mailed

## 2023-10-30 NOTE — Progress Notes (Deleted)
NEUROLOGY FOLLOW UP OFFICE NOTE  Mary Higgins [id number]  Assessment/Plan:   Chronic daily headaches, posttraumatic/tension-type complicated by medication overuse. Gait instability/frequent falls.  Likely secondary to diabetic polyneuropathy and deconditioning History of symptomatic seizure in setting of clonazepam withdrawal, no recurrence Major neurocognitive disorder History of traumatic subdural hematoma x 2 History of cerebral aneurysm coiling   For daily headaches: Start gabapentin 100mg  at bedtime.  We can increase to 200mg  at bedtime in 4 weeks if needed. Limit use of pain relievers to no more than 2 days out of week to prevent risk of rebound or medication-overuse headache. Refer to physical therapy for unsteady gait.  Advised that she must continue exercises once discharged or she will decompensate 24 hour supervision Follow up 5 months.   Subjective:  Mary Higgins is an 82 year old female with DM II, HTN, HLD, COPD, Fuch's corneal dystrophy, cerebral meningioma, pacemaker, and history of breast cancer, traumatic SDH s/p, VP shunt and cerebral aneurysm rupture s/p coiling who follows up for headaches..  History supplemented by her accompanying daughter and referring provider's note.  UPDATE: *Started gabapentin for headaches.  *** Referred to PT for unsteady gait.  ***  Current NSAIDS/analgesics:  acetaminophen, ibuprofen Current Antihypertensive medications:  losartan Current Antidepressant medications:  mirtazapine 15mg  at bedtime Current antiepileptic:  gabapentin 100mg  at bedtime *** Other medications:  clonazepam (for blepharospasm)  HISTORY: In 2000, had non-traumatic intracerebral hemorrhage due to aneurysm rupture treated with coiling.    She has history of recurrent falls.  In September 2022, had an episode of syncope in which she fell and struck her head.  Afterwards, noted headache and slowed speech.  Went to ED where she was found to have a  2.3 cm SDH with 9 mm midline shift. Requiring burr hole.  SDH was monitored and found to be increasing in size, so underwent embolization of the right middle meningeal artery in November 2022.  In November 2023, she had a syncopal episode where she was unresponsive with right arm shaking for several minutes in setting of diarrhea for several months.  She takes clonazepam daily for blepharospasm and had been out for several days.  CT head showed left VP shunt with stable ventricular size and stable bilateral chronic SDH 7mm on right and 5 mm on left.  EEG within normal limits.  No other similar episodes but continues to have falls.    She had a fall on 06/04/2023 and struck her head.  Seen in ED. CT head again showed left VP sunt with slightly increased ventricular volume and decrease chronic subdural hygromas compared to Nov 2023 and calcified left paramedian vertex meningioma.    Since the SDH in 2022, she has daily headaches, described as a diffuse aching.  She has associated photophobia but not nausea or phonophobia.  She normally takes acetaminophen or ibuprofen.  Takes it everyday.    She lives with her daughter who is her caregiver.  She is dependent for ADLs, including managing medications.  Sometimes she has left the stove on while boiling eggs.  Short term memory problems.  She is usually sedentary.  Gait seems to improve after PT but declines after she finishes.     PAST MEDICAL HISTORY: Past Medical History:  Diagnosis Date   Anxiety and depression    Hx of   Blepharospasm     Breast cancer (HCC) 1987   s/p lumpectomy.   Carotid bruit    Bilateral. Mild to moderate disease on LEFT  and mild on the RIGHT in 2012   COPD (chronic obstructive pulmonary disease) (HCC)    mild. Not on inhalers.   Diabetes mellitus without complication (HCC)    AODM   Fuchs' corneal dystrophy    GERD (gastroesophageal reflux disease)     Headache    Hyperlipidemia    Hypertension     Insomnia      Intracerebral bleed (HCC) 2003   Remote intracerebral blled and coiling by Dr. Corliss Skains for cerebral aneurysm and a shunt placed by Dr. Venetia Maxon remotely. No seizure disorder and this happened in 2003 with an anterior communicating aneurysm.   Meningioma (HCC)    left parafalcine calcified meningioma   Neurocardiogenic syncope    a. s/p Biotronik dual chamber pacemaker   Pacemaker    Personal history of radiation therapy    S/P VP shunt 2003   Sinus node dysfunction (HCC)     Tobacco abuse     Type II or unspecified type diabetes mellitus without mention of complication, not stated as uncontrolled      MEDICATIONS: Current Outpatient Medications on File Prior to Visit  Medication Sig Dispense Refill   Artificial Tear Ointment (DRY EYES OP) Place 1 drop into both eyes daily as needed (dry eyes).     Blood Glucose Monitoring Suppl (ONE TOUCH ULTRA 2) w/Device KIT USE UP TO FOUR TIMES DAILY AS DIRECTED 1 kit 0   clonazePAM (KLONOPIN) 1 MG tablet TAKE 1 TABLET BY MOUTH THREE TIMES DAILY 90 tablet 0   gabapentin (NEURONTIN) 100 MG capsule Take 1 capsule (100 mg total) by mouth at bedtime. 30 capsule 5   losartan (COZAAR) 100 MG tablet TAKE 1/2 TABLET BY MOUTH IN THE MORNING AND IN THE EVENING 90 tablet 1   mirtazapine (REMERON) 15 MG tablet TAKE 1 TABLET(15 MG) BY MOUTH AT BEDTIME 90 tablet 0   pantoprazole (PROTONIX) 40 MG tablet TAKE 1 TABLET(40 MG) BY MOUTH DAILY 90 tablet 3   No current facility-administered medications on file prior to visit.    ALLERGIES: Allergies  Allergen Reactions   Oxaprozin Rash    FAMILY HISTORY: Family History  Problem Relation Age of Onset   Cancer Mother    Heart attack Father    Heart attack Son        Heart Pump placed    Diabetes Son    Hypertension Other       Objective:  *** General: No acute distress.  Patient appears ***-groomed.   Head:  Normocephalic/atraumatic Eyes:  Fundi examined but not visualized Neck: supple, no paraspinal  tenderness, full range of motion Heart:  Regular rate and rhythm Lungs:  Clear to auscultation bilaterally Back: No paraspinal tenderness Neurological Exam: alert and oriented.  Speech fluent and not dysarthric, language intact.  CN II-XII intact. Bulk and tone normal, muscle strength 5/5 throughout.  Sensation to light touch intact.  Deep tendon reflexes 2+ throughout, toes downgoing.  Finger to nose testing intact.  Gait normal, Romberg negative.   Shon Millet, DO  CC: ***

## 2023-10-31 ENCOUNTER — Ambulatory Visit: Payer: Medicare HMO | Admitting: Neurology

## 2023-11-08 ENCOUNTER — Other Ambulatory Visit: Payer: Medicare HMO

## 2023-11-08 DIAGNOSIS — I10 Essential (primary) hypertension: Secondary | ICD-10-CM

## 2023-11-08 DIAGNOSIS — E1169 Type 2 diabetes mellitus with other specified complication: Secondary | ICD-10-CM

## 2023-11-16 ENCOUNTER — Other Ambulatory Visit: Payer: Self-pay | Admitting: Nurse Practitioner

## 2023-11-16 DIAGNOSIS — G245 Blepharospasm: Secondary | ICD-10-CM

## 2023-11-16 NOTE — Telephone Encounter (Signed)
 Patient is requesting a refill of the following medications: Requested Prescriptions   Pending Prescriptions Disp Refills   clonazePAM  (KLONOPIN ) 1 MG tablet [Pharmacy Med Name: CLONAZEPAM  1MG  TABLETS] 90 tablet     Sig: TAKE 1 TABLET BY MOUTH THREE TIMES DAILY    Date of last refill: 10/19/23  Refill amount: 0  Treatment agreement date:  12/05/2021

## 2023-11-29 ENCOUNTER — Ambulatory Visit (INDEPENDENT_AMBULATORY_CARE_PROVIDER_SITE_OTHER): Payer: Medicare HMO

## 2023-11-29 DIAGNOSIS — I495 Sick sinus syndrome: Secondary | ICD-10-CM | POA: Diagnosis not present

## 2023-11-29 LAB — CUP PACEART REMOTE DEVICE CHECK
Battery Voltage: 45
Date Time Interrogation Session: 20250116083116
Implantable Lead Connection Status: 753985
Implantable Lead Connection Status: 753985
Implantable Lead Implant Date: 20070830
Implantable Lead Implant Date: 20070830
Implantable Lead Location: 753859
Implantable Lead Location: 753860
Implantable Pulse Generator Implant Date: 20151113
Pulse Gen Model: 394931
Pulse Gen Serial Number: 68408118

## 2023-12-08 ENCOUNTER — Encounter: Payer: Self-pay | Admitting: Cardiovascular Disease

## 2023-12-10 ENCOUNTER — Other Ambulatory Visit: Payer: Medicare HMO

## 2023-12-10 DIAGNOSIS — E1169 Type 2 diabetes mellitus with other specified complication: Secondary | ICD-10-CM | POA: Diagnosis not present

## 2023-12-10 DIAGNOSIS — I10 Essential (primary) hypertension: Secondary | ICD-10-CM | POA: Diagnosis not present

## 2023-12-11 LAB — COMPLETE METABOLIC PANEL WITH GFR
AG Ratio: 2 (calc) (ref 1.0–2.5)
ALT: 5 U/L — ABNORMAL LOW (ref 6–29)
AST: 10 U/L (ref 10–35)
Albumin: 4.2 g/dL (ref 3.6–5.1)
Alkaline phosphatase (APISO): 55 U/L (ref 37–153)
BUN: 13 mg/dL (ref 7–25)
CO2: 28 mmol/L (ref 20–32)
Calcium: 9.7 mg/dL (ref 8.6–10.4)
Chloride: 103 mmol/L (ref 98–110)
Creat: 0.79 mg/dL (ref 0.60–0.95)
Globulin: 2.1 g/dL (ref 1.9–3.7)
Glucose, Bld: 147 mg/dL — ABNORMAL HIGH (ref 65–139)
Potassium: 4.4 mmol/L (ref 3.5–5.3)
Sodium: 138 mmol/L (ref 135–146)
Total Bilirubin: 1 mg/dL (ref 0.2–1.2)
Total Protein: 6.3 g/dL (ref 6.1–8.1)
eGFR: 75 mL/min/{1.73_m2} (ref >=60)

## 2023-12-11 LAB — CBC WITH DIFFERENTIAL/PLATELET
Absolute Lymphocytes: 1496 {cells}/uL (ref 850–3900)
Absolute Monocytes: 343 {cells}/uL (ref 200–950)
Basophils Absolute: 62 {cells}/uL (ref 0–200)
Basophils Relative: 1.4 %
Eosinophils Absolute: 150 {cells}/uL (ref 15–500)
Eosinophils Relative: 3.4 %
HCT: 41.2 % (ref 35.0–45.0)
Hemoglobin: 13.3 g/dL (ref 11.7–15.5)
MCH: 29.2 pg (ref 27.0–33.0)
MCHC: 32.3 g/dL (ref 32.0–36.0)
MCV: 90.5 fL (ref 80.0–100.0)
MPV: 10.6 fL (ref 7.5–12.5)
Monocytes Relative: 7.8 %
Neutro Abs: 2350 {cells}/uL (ref 1500–7800)
Neutrophils Relative %: 53.4 %
Platelets: 237 10*3/uL (ref 140–400)
RBC: 4.55 Million/uL (ref 3.80–5.10)
RDW: 12.5 % (ref 11.0–15.0)
Total Lymphocyte: 34 %
WBC: 4.4 10*3/uL (ref 3.8–10.8)

## 2023-12-11 LAB — HEMOGLOBIN A1C
Hgb A1c MFr Bld: 5.9 %{Hb} — ABNORMAL HIGH
Mean Plasma Glucose: 123 mg/dL
eAG (mmol/L): 6.8 mmol/L

## 2023-12-13 ENCOUNTER — Encounter: Payer: Self-pay | Admitting: Nurse Practitioner

## 2023-12-18 ENCOUNTER — Other Ambulatory Visit: Payer: Self-pay | Admitting: Nurse Practitioner

## 2023-12-18 DIAGNOSIS — G245 Blepharospasm: Secondary | ICD-10-CM

## 2023-12-18 NOTE — Telephone Encounter (Signed)
Patient is requesting a refill of the following medications: Requested Prescriptions   Pending Prescriptions Disp Refills   clonazePAM (KLONOPIN) 1 MG tablet [Pharmacy Med Name: CLONAZEPAM 1MG  TABLETS] 90 tablet     Sig: TAKE 1 TABLET BY MOUTH THREE TIMES DAILY    Date of last refill:11/16/2023  Refill amount: 90 refills 0  Treatment agreement date:

## 2024-01-07 NOTE — Progress Notes (Signed)
 Remote pacemaker transmission.

## 2024-01-17 ENCOUNTER — Other Ambulatory Visit: Payer: Self-pay | Admitting: Neurology

## 2024-01-17 ENCOUNTER — Other Ambulatory Visit: Payer: Self-pay | Admitting: Nurse Practitioner

## 2024-01-17 DIAGNOSIS — G245 Blepharospasm: Secondary | ICD-10-CM

## 2024-01-18 NOTE — Telephone Encounter (Signed)
 Patient has request refill on medication Clonazepam 1mg . Patient medication last refilled 12/19/2023. Patient has Non Opioid contract on file dated 12/08/2021. Patient has upcoming contract 01/25/2024. Update Contract added to appointment notes. Medication pend and sent to PCP Janyth Contes Janene Harvey, NP for approval.

## 2024-01-25 ENCOUNTER — Ambulatory Visit: Payer: Medicare HMO | Admitting: Nurse Practitioner

## 2024-02-11 ENCOUNTER — Ambulatory Visit: Admitting: Nurse Practitioner

## 2024-02-15 ENCOUNTER — Encounter: Payer: Self-pay | Admitting: Nurse Practitioner

## 2024-02-15 ENCOUNTER — Ambulatory Visit: Payer: Medicare HMO | Admitting: Nurse Practitioner

## 2024-02-15 ENCOUNTER — Other Ambulatory Visit: Payer: Self-pay | Admitting: Nurse Practitioner

## 2024-02-15 VITALS — BP 124/80 | HR 81 | Temp 97.2°F | Resp 16 | Ht 71.0 in | Wt 131.8 lb

## 2024-02-15 DIAGNOSIS — G4700 Insomnia, unspecified: Secondary | ICD-10-CM

## 2024-02-15 DIAGNOSIS — L608 Other nail disorders: Secondary | ICD-10-CM

## 2024-02-15 DIAGNOSIS — E1169 Type 2 diabetes mellitus with other specified complication: Secondary | ICD-10-CM | POA: Diagnosis not present

## 2024-02-15 DIAGNOSIS — I10 Essential (primary) hypertension: Secondary | ICD-10-CM

## 2024-02-15 DIAGNOSIS — G245 Blepharospasm: Secondary | ICD-10-CM | POA: Diagnosis not present

## 2024-02-15 DIAGNOSIS — L84 Corns and callosities: Secondary | ICD-10-CM

## 2024-02-15 DIAGNOSIS — R600 Localized edema: Secondary | ICD-10-CM

## 2024-02-15 DIAGNOSIS — N3946 Mixed incontinence: Secondary | ICD-10-CM | POA: Diagnosis not present

## 2024-02-15 MED ORDER — CLONAZEPAM 1 MG PO TABS
1.0000 mg | ORAL_TABLET | Freq: Three times a day (TID) | ORAL | 5 refills | Status: DC
Start: 2024-02-15 — End: 2024-08-13

## 2024-02-15 NOTE — Patient Instructions (Signed)
 Do not forget to reschedule eye exam  -encouraged to elevate legs above level of heart as tolerates, low sodium diet, compression hose/socks as tolerates (on in am, off in pm)  Restart remeron half tablet for 5 days then increase to whole tablet.

## 2024-02-15 NOTE — Progress Notes (Signed)
 Careteam: Patient Care Team: Sharon Seller, NP as PCP - General (Geriatric Medicine) Marily Lente, NP (Inactive) as Nurse Practitioner (Cardiology) Mia Creek, MD as Consulting Physician (Ophthalmology) Hillis Range, MD (Inactive) as Consulting Physician (Cardiology) Jill Side, OD as Referring Physician (Ophthalmology)  PLACE OF SERVICE:  [facility]  Advanced Directive information Does Patient Have a Medical Advance Directive?: No, Would patient like information on creating a medical advance directive?: No - Patient declined  Allergies  Allergen Reactions   Oxaprozin Rash    Chief Complaint  Patient presents with   Medical Management of Chronic Issues    3 month follow up. Discuss the need for Covid Booster, Eye exam, Foot exam, Diabetic kidney evaluation, and AWV.      Discussed the use of AI scribe software for clinical note transcription with the patient, who gave verbal consent to proceed.  History of Present Illness   Mary Higgins is an 83 year old female with diabetes who presents for a six-month follow-up visit. She is accompanied by her caregiver, who is her daughter.  She experiences persistent swelling in her legs, which has worsened over the past week, now extending up her legs. Despite the swelling, there is no pain in her legs. She has difficulty sleeping, often waking up and pacing at night, although she slept through the night last night for the first time in a while. She sits frequently during the day and often dangles her feet when sitting.  Her nutritional intake is inconsistent, with some days of minimal eating and other days of frequent snacking. She uses Ensure to supplement her diet, maintaining her protein levels on the lower end of normal. She enjoys peanut butter but consumes it infrequently.  She has been out of mirtazapine, which she takes at night, contributing to her recent sleep disturbances. Her caregiver plans to pick up  the medication soon.  She experiences urinary incontinence, sometimes not realizing the need to urinate or being unable to make it in time. She wears adult diapers, which are changed frequently to maintain cleanliness and prevent infections.  She has significant calluses on her feet, causing pain, especially when walking.. No numbness or tingling in her feet.  She experiences anxiety about leaving the house, feeling embarrassed about her appearance, which contributes to her reluctance to attend medical appointments.  She has a history of diabetes and is not currently taking any medication for it.   She is on gabapentin for headaches, which has reduced her frequency. She previously took multiple pills daily for headaches but has reduced this due to the gabapentin's effectiveness.      Review of Systems:  Review of Systems  Constitutional:  Negative for chills, fever and weight loss.  HENT:  Negative for tinnitus.   Respiratory:  Negative for cough, sputum production and shortness of breath.   Cardiovascular:  Positive for leg swelling. Negative for chest pain and palpitations.  Gastrointestinal:  Negative for abdominal pain, constipation, diarrhea and heartburn.  Genitourinary:  Negative for dysuria, frequency and urgency.  Musculoskeletal:  Negative for back pain, falls, joint pain and myalgias.  Skin: Negative.   Neurological:  Positive for headaches. Negative for dizziness.  Psychiatric/Behavioral:  Negative for depression and memory loss. The patient is nervous/anxious and has insomnia.     Past Medical History:  Diagnosis Date   Anxiety and depression    Hx of   Blepharospasm     Breast cancer (HCC) 1987   s/p  lumpectomy.   Carotid bruit    Bilateral. Mild to moderate disease on LEFT and mild on the RIGHT in 2012   COPD (chronic obstructive pulmonary disease) (HCC)    mild. Not on inhalers.   Diabetes mellitus without complication (HCC)    AODM   Fuchs' corneal dystrophy     GERD (gastroesophageal reflux disease)     Headache    Hyperlipidemia    Hypertension     Insomnia     Intracerebral bleed (HCC) 2003   Remote intracerebral blled and coiling by Dr. Corliss Skains for cerebral aneurysm and a shunt placed by Dr. Venetia Maxon remotely. No seizure disorder and this happened in 2003 with an anterior communicating aneurysm.   Meningioma (HCC)    left parafalcine calcified meningioma   Neurocardiogenic syncope    a. s/p Biotronik dual chamber pacemaker   Pacemaker    Personal history of radiation therapy    S/P VP shunt 2003   Sinus node dysfunction (HCC)     Tobacco abuse     Type II or unspecified type diabetes mellitus without mention of complication, not stated as uncontrolled     Past Surgical History:  Procedure Laterality Date   ANEURYSM COILING  11/13/2001   Cerebral aneurysm ;Dr. Julieanne Cotton   BREAST LUMPECTOMY Right    For cancer, no recurrence   CEREBRAL ANEURYSM REPAIR  2023   CHOLECYSTECTOMY     CRANIOTOMY Right 08/04/2021   Procedure: BURR HOLES FOR SUBDURAL HEMATOMA;  Surgeon: Jadene Pierini, MD;  Location: [facility] OR;  Service: Neurosurgery;  Laterality: Right;   IR ANGIO INTRA EXTRACRAN SEL INTERNAL CAROTID UNI R MOD SED  09/30/2021   IR ANGIO VERTEBRAL SEL VERTEBRAL UNI R MOD SED  09/30/2021   IR TRANSCATH/EMBOLIZ  09/30/2021   PACEMAKER GENERATOR CHANGE N/A 09/25/2014   BTK dual chamber pacemkaer implanted by Dr Johney Frame   PACEMAKER INSERTION  07/12/2006   [employer] XL DR  -  Dr. Jenne Campus   PARTIAL HYSTERECTOMY     RADIOLOGY WITH ANESTHESIA N/A 09/30/2021   Procedure: Arteriogram, Onyx embolization of right middle meningeal artery;  Surgeon: Lisbeth Renshaw, MD;  Location: [facility] OR;  Service: Radiology;  Laterality: N/A;   VENTRICULOPERITONEAL SHUNT  11/13/2001   Dr. Maeola Harman   Social History:   reports that she has been smoking cigarettes. She started smoking about 5 years ago. She has never used smokeless tobacco. She  reports that she does not drink alcohol and does not use drugs.  Family History  Problem Relation Age of Onset   Cancer Mother    Heart attack Father    Heart attack Son        Heart Pump placed    Diabetes Son    Hypertension Other     Medications: Patient's Medications  New Prescriptions   No medications on file  Previous Medications   ARTIFICIAL TEAR OINTMENT (DRY EYES OP)    Place 1 drop into both eyes daily as needed (dry eyes).   BLOOD GLUCOSE MONITORING SUPPL (ONE TOUCH ULTRA 2) W/DEVICE KIT    USE UP TO FOUR TIMES DAILY AS DIRECTED   GABAPENTIN (NEURONTIN) 100 MG CAPSULE    Take 1 capsule (100 mg total) by mouth at bedtime.   LOSARTAN (COZAAR) 100 MG TABLET    TAKE 1/2 TABLET BY MOUTH IN THE MORNING AND IN THE EVENING   MIRTAZAPINE (REMERON) 15 MG TABLET    TAKE 1 TABLET(15 MG) BY MOUTH AT BEDTIME  PANTOPRAZOLE (PROTONIX) 40 MG TABLET    TAKE 1 TABLET(40 MG) BY MOUTH DAILY  Modified Medications   Modified Medication Previous Medication   CLONAZEPAM (KLONOPIN) 1 MG TABLET clonazePAM (KLONOPIN) 1 MG tablet      TAKE 1 TABLET BY MOUTH THREE TIMES DAILY    TAKE 1 TABLET BY MOUTH THREE TIMES DAILY  Discontinued Medications   No medications on file    Physical Exam:  Vitals:   02/15/24 1531  BP: 124/80  Pulse: 81  Resp: 16  Temp: (!) 97.2 F (36.2 C)  SpO2: 98%  Weight: 131 lb 12.8 oz (59.8 kg)  Height: 5\' 11"  (1.803 m)   Body mass index is 18.38 kg/m. Wt Readings from Last 3 Encounters:  02/15/24 131 lb 12.8 oz (59.8 kg)  06/11/23 127 lb (57.6 kg)  06/11/23 127 lb (57.6 kg)    Physical Exam Constitutional:      General: She is not in acute distress.    Appearance: She is well-developed. She is not diaphoretic.  HENT:     Head: Normocephalic and atraumatic.     Mouth/Throat:     Pharynx: No oropharyngeal exudate.  Eyes:     Conjunctiva/sclera: Conjunctivae normal.     Pupils: Pupils are equal, round, and reactive to light.  Cardiovascular:     Rate  and Rhythm: Normal rate and regular rhythm.     Heart sounds: Normal heart sounds.  Pulmonary:     Effort: Pulmonary effort is normal.     Breath sounds: Normal breath sounds.  Abdominal:     General: Bowel sounds are normal.     Palpations: Abdomen is soft.  Musculoskeletal:     Cervical back: Normal range of motion and neck supple.     Right lower leg: No edema.     Left lower leg: No edema.  Feet:     Right foot:     Skin integrity: Callus present.     Toenail Condition: Right toenails are abnormally thick and long.     Left foot:     Skin integrity: Callus present.     Toenail Condition: Left toenails are abnormally thick and long.  Skin:    General: Skin is warm and dry.  Neurological:     Mental Status: She is alert.  Psychiatric:        Mood and Affect: Mood normal.     Labs reviewed: Basic Metabolic Panel: Recent Labs    12/10/23 1530  NA 138  K 4.4  CL 103  CO2 28  GLUCOSE 147*  BUN 13  CREATININE 0.79  CALCIUM 9.7   Liver Function Tests: Recent Labs    12/10/23 1530  AST 10  ALT 5*  BILITOT 1.0  PROT 6.3   No results for input(s): "LIPASE", "AMYLASE" in the last 8760 hours. No results for input(s): "AMMONIA" in the last 8760 hours. CBC: Recent Labs    12/10/23 1530  WBC 4.4  NEUTROABS 2,350  HGB 13.3  HCT 41.2  MCV 90.5  PLT 237   Lipid Panel: No results for input(s): "CHOL", "HDL", "LDLCALC", "TRIG", "CHOLHDL", "LDLDIRECT" in the last 8760 hours. TSH: No results for input(s): "TSH" in the last 8760 hours. A1C: Lab Results  Component Value Date   HGBA1C 5.9 (H) 12/10/2023     Assessment/Plan Assessment and Plan    Peripheral Edema Chronic peripheral edema worsened by low protein intake and prolonged sitting. - Recommend compression therapy using hose or socks from medical supply  stores or online. - Advise elevating legs to heart level when sitting. - Encourage increased dietary protein intake, including Ensure supplements,  peanut butter, cheese, and beans. - Consider short course of diuretics if edema does not improve with conservative measures.  Diabetes Mellitus Diabetes requires routine monitoring. - Schedule eye exam to monitor for diabetic retinopathy. - Perform urine test to check for proteinuria. -follow A1c every 6 months   Insomnia Insomnia likely exacerbated by discontinuation of mirtazapine. - Restart mirtazapine at half a tablet for a few days, then increase to a full tablet. - Ensure medication is refilled to prevent future lapses.  Incontinence Urinary incontinence with occasional lack of awareness of the need to urinate. - Provide prescription for incontinence supplies to assist with insurance coverage. - Emphasize frequent changing of incontinence products to prevent infections.  Foot Calluses and Toenail Issues Thick calluses and overgrown toenails causing discomfort and potential for skin breakdown. - Refer to podiatrist for routine foot care and management of calluses and toenails. - Advise against using over-the-counter products for calluses due to risk of skin damage.  -Consistent provided for toe nails to be trimmed, Nails sharply debrided 7 without complication/bleeding     Blepharospasm -    ongoing and unchanged, continue current regimen clonazePAM; Take 1 tablet (1 mg total) by mouth 3 (three) times daily.  Dispense: 90 tablet; Refill: 5  Hypertension -Blood pressure well controlled, goal bp <140/90 Continue current medications and dietary modifications follow metabolic panel   Return in about 6 months (around 08/16/2024) for labs at time of visit, routine follow up.  Janene Harvey. Biagio Borg [facility] [phone]

## 2024-02-19 NOTE — Progress Notes (Signed)
 Patient son contacted for Incontinence Urinary supplies. Faxed to [facility] in [place], [place] 02/18/2024.

## 2024-02-28 ENCOUNTER — Ambulatory Visit (INDEPENDENT_AMBULATORY_CARE_PROVIDER_SITE_OTHER): Payer: Medicare Other

## 2024-02-28 DIAGNOSIS — I495 Sick sinus syndrome: Secondary | ICD-10-CM

## 2024-03-01 LAB — CUP PACEART REMOTE DEVICE CHECK
Date Time Interrogation Session: 20250417103125
Implantable Lead Connection Status: 753985
Implantable Lead Connection Status: 753985
Implantable Lead Implant Date: 20070830
Implantable Lead Implant Date: 20070830
Implantable Lead Location: 753859
Implantable Lead Location: 753860
Implantable Pulse Generator Implant Date: 20151113
Pulse Gen Model: 394931
Pulse Gen Serial Number: 68408118

## 2024-03-21 ENCOUNTER — Encounter: Payer: Self-pay | Admitting: Nurse Practitioner

## 2024-03-21 ENCOUNTER — Ambulatory Visit (INDEPENDENT_AMBULATORY_CARE_PROVIDER_SITE_OTHER): Admitting: Nurse Practitioner

## 2024-03-21 ENCOUNTER — Ambulatory Visit: Admitting: Nurse Practitioner

## 2024-03-21 VITALS — Ht 71.0 in

## 2024-03-21 DIAGNOSIS — Z0000 Encounter for general adult medical examination without abnormal findings: Secondary | ICD-10-CM

## 2024-03-21 NOTE — Patient Instructions (Signed)
  Mary Higgins , Thank you for taking time to come for your Medicare Wellness Visit. I appreciate your ongoing commitment to your health goals. Please review the following plan we discussed and let me know if I can assist you in the future.   Make sure you ask ophthalmology to send records to our office.     This is a list of the screening recommended for you and due dates:  Health Maintenance  Topic Date Due   Eye exam for diabetics  09/06/2022   COVID-19 Vaccine (6 - 2024-25 season) 07/15/2023   Yearly kidney health urinalysis for diabetes  02/05/2024   Hemoglobin A1C  06/08/2024   Flu Shot  06/13/2024   Yearly kidney function blood test for diabetes  12/09/2024   Complete foot exam   02/14/2025   Medicare Annual Wellness Visit  03/21/2025   DTaP/Tdap/Td vaccine (2 - Td or Tdap) 03/20/2031   Pneumonia Vaccine  Completed   DEXA scan (bone density measurement)  Completed   Zoster (Shingles) Vaccine  Completed   HPV Vaccine  Aged Out   Meningitis B Vaccine  Aged Out   Hepatitis C Screening  Discontinued

## 2024-03-21 NOTE — Progress Notes (Signed)
 This service is provided via telemedicine  No vital signs collected/recorded due to the encounter was a telemedicine visit.   Location of patient (ex: home, work):  home  Patient consents to a telephone visit: yes  Location of the provider (ex: office, home):  [facility]   Name of any referring provider:  N/A  Names of all persons participating in the telemedicine service and their role in the encounter:  Mary Higgins/ RMA, Roselie Conger, Champ Coma, NP, and Patient.   Time spent on call:  11

## 2024-03-21 NOTE — Progress Notes (Signed)
 Subjective:   Mary Higgins is a 83 y.o. female who presents for Medicare Annual (Subsequent) preventive examination.  Visit Complete: Virtual I connected with  Mary Higgins on 03/21/24 by a video and audio enabled telemedicine application and verified that I am speaking with the correct person using two identifiers.  Patient Location: Home  Provider Location: Office/Clinic  I discussed the limitations of evaluation and management by telemedicine. The patient expressed understanding and agreed to proceed.  Vital Signs: Because this visit was a virtual/telehealth visit, some criteria may be missing or patient reported. Any vitals not documented were not able to be obtained and vitals that have been documented are patient reported.   Cardiac Risk Factors include: advanced age (>48men, >21 women);diabetes mellitus;sedentary lifestyle;hypertension     Objective:     Today's Vitals   03/21/24 1120  Height: 5\' 11"  (1.803 m)   Body mass index is 18.38 kg/m.     02/15/2024    3:37 PM 06/11/2023    2:53 PM 06/04/2023    4:21 AM 02/07/2023    3:12 PM 02/05/2023    1:03 PM 09/20/2022    8:32 PM 09/12/2022    4:25 PM  Advanced Directives  Does Patient Have a Medical Advance Directive? No No No No No No No  Would patient like information on creating a medical advance directive? No - Patient declined   No - Patient declined No - Patient declined  No - Patient declined    Current Medications (verified) Outpatient Encounter Medications as of 03/21/2024  Medication Sig   Artificial Tear Ointment (DRY EYES OP) Place 1 drop into both eyes daily as needed (dry eyes).   Blood Glucose Monitoring Suppl (ONE TOUCH ULTRA 2) w/Device KIT USE UP TO FOUR TIMES DAILY AS DIRECTED   clonazePAM  (KLONOPIN ) 1 MG tablet Take 1 tablet (1 mg total) by mouth 3 (three) times daily.   gabapentin  (NEURONTIN ) 100 MG capsule Take 1 capsule (100 mg total) by mouth at bedtime.   losartan  (COZAAR ) 100 MG  tablet TAKE 1/2 TABLET BY MOUTH IN THE MORNING AND IN THE EVENING   mirtazapine  (REMERON ) 15 MG tablet TAKE 1 TABLET(15 MG) BY MOUTH AT BEDTIME   pantoprazole  (PROTONIX ) 40 MG tablet TAKE 1 TABLET(40 MG) BY MOUTH DAILY   No facility-administered encounter medications on file as of 03/21/2024.    Allergies (verified) Oxaprozin   History: Past Medical History:  Diagnosis Date   Anxiety and depression    Hx of   Blepharospasm     Breast cancer (HCC) 1987   s/p lumpectomy.   Carotid bruit    Bilateral. Mild to moderate disease on LEFT and mild on the RIGHT in 2012   COPD (chronic obstructive pulmonary disease) (HCC)    mild. Not on inhalers.   Diabetes mellitus without complication (HCC)    AODM   Fuchs' corneal dystrophy    GERD (gastroesophageal reflux disease)     Headache    Hyperlipidemia    Hypertension     Insomnia     Intracerebral bleed (HCC) 2003   Remote intracerebral blled and coiling by Dr. Alvira Josephs for cerebral aneurysm and a shunt placed by Dr. Nigel Bart remotely. No seizure disorder and this happened in 2003 with an anterior communicating aneurysm.   Meningioma (HCC)    left parafalcine calcified meningioma   Neurocardiogenic syncope    a. s/p Biotronik dual chamber pacemaker   Pacemaker    Personal history of radiation therapy  S/P VP shunt 2003   Sinus node dysfunction (HCC)     Tobacco abuse     Type II or unspecified type diabetes mellitus without mention of complication, not stated as uncontrolled     Past Surgical History:  Procedure Laterality Date   ANEURYSM COILING  11/13/2001   Cerebral aneurysm ;Dr. Luellen Sages   BREAST LUMPECTOMY Right    For cancer, no recurrence   CEREBRAL ANEURYSM REPAIR  2023   CHOLECYSTECTOMY     CRANIOTOMY Right 08/04/2021   Procedure: BURR HOLES FOR SUBDURAL HEMATOMA;  Surgeon: Cannon Champion, MD;  Location: [facility] OR;  Service: Neurosurgery;  Laterality: Right;   IR ANGIO INTRA EXTRACRAN SEL INTERNAL CAROTID UNI  R MOD SED  09/30/2021   IR ANGIO VERTEBRAL SEL VERTEBRAL UNI R MOD SED  09/30/2021   IR TRANSCATH/EMBOLIZ  09/30/2021   PACEMAKER GENERATOR CHANGE N/A 09/25/2014   BTK dual chamber pacemkaer implanted by Dr Nunzio Belch   PACEMAKER INSERTION  07/12/2006   St. Jude Victory XL DR  -  Dr. Silvestre Drum   PARTIAL HYSTERECTOMY     RADIOLOGY WITH ANESTHESIA N/A 09/30/2021   Procedure: Arteriogram, Onyx embolization of right middle meningeal artery;  Surgeon: Augusto Blonder, MD;  Location: [facility] OR;  Service: Radiology;  Laterality: N/A;   VENTRICULOPERITONEAL SHUNT  11/13/2001   Dr. Manya Sells   Family History  Problem Relation Age of Onset   Cancer Mother    Heart attack Father    Heart attack Son        Heart Pump placed    Diabetes Son    Hypertension Other    Social History   Socioeconomic History   Marital status: Legally Separated    Spouse name: Not on file   Number of children: Not on file   Years of education: Not on file   Highest education level: Not on file  Occupational History   Not on file  Tobacco Use   Smoking status: Some Days    Types: Cigarettes    Start date: 01/12/2019   Smokeless tobacco: Never   Tobacco comments:    1 cig weekly sometimes will go longer than 1 week w/o smoking   Vaping Use   Vaping status: Never Used  Substance and Sexual Activity   Alcohol  use: No    Alcohol /week: 0.0 standard drinks of alcohol    Drug use: Never   Sexual activity: Not Currently  Other Topics Concern   Not on file  Social History Narrative   Not on file   Social Drivers of Health   Financial Resource Strain: Low Risk  (05/13/2018)   Overall Financial Resource Strain (CARDIA)    Difficulty of Paying Living Expenses: Not hard at all  Food Insecurity: No Food Insecurity (02/15/2024)   Hunger Vital Sign    Worried About Running Out of Food in the Last Year: Never true    Ran Out of Food in the Last Year: Never true  Transportation Needs: No Transportation Needs (02/15/2024)    PRAPARE - Administrator, Civil Service (Medical): No    Lack of Transportation (Non-Medical): No  Physical Activity: Inactive (05/13/2018)   Exercise Vital Sign    Days of Exercise per Week: 0 days    Minutes of Exercise per Session: 0 min  Stress: No Stress Concern Present (05/13/2018)   [employer] of Occupational Health - Occupational Stress Questionnaire    Feeling of Stress : Only a little  Social Connections: Socially  Isolated (02/15/2024)   Social Connection and Isolation Panel [NHANES]    Frequency of Communication with Friends and Family: Never    Frequency of Social Gatherings with Friends and Family: Never    Attends Religious Services: Never    Database administrator or Organizations: Yes    Attends Banker Meetings: Never    Marital Status: Widowed    Tobacco Counseling Ready to quit: Not Answered Counseling given: Not Answered Tobacco comments: 1 cig weekly sometimes will go longer than 1 week w/o smoking    Clinical Intake:  Pre-visit preparation completed: Yes  Pain : No/denies pain     BMI - recorded: 18 Nutritional Status: BMI <19  Underweight Nutritional Risks: Unintentional weight loss Diabetes: No  How often do you need to have someone help you when you read instructions, pamphlets, or other written materials from your doctor or pharmacy?: 3 - Sometimes         Activities of Daily Living    03/21/2024    2:46 PM  In your present state of health, do you have any difficulty performing the following activities:  Hearing? 1  Vision? 0  Difficulty concentrating or making decisions? 0  Walking or climbing stairs? 0  Dressing or bathing? 0  Doing errands, shopping? 1  Preparing Food and eating ? N  Using the Toilet? N  In the past six months, have you accidently leaked urine? N  Do you have problems with loss of bowel control? N  Managing your Medications? N  Managing your Finances? Y  Housekeeping or managing your  Housekeeping? Y    Patient Care Team: Verma Gobble, NP as PCP - General (Geriatric Medicine) Mel Spine, NP (Inactive) as Nurse Practitioner (Cardiology) Ronni Colace, MD as Consulting Physician (Ophthalmology) Jolly Needle, MD (Inactive) as Consulting Physician (Cardiology) Ranny Bye, OD as Referring Physician (Ophthalmology)  Indicate any recent Medical Services you may have received from other than [facility] providers in the past year (date may be approximate).     Assessment:    This is a routine wellness examination for Mary Higgins .  Hearing/Vision screen Hearing Screening - Comments:: No issues   Goals Addressed   None    Depression Screen    03/21/2024    2:23 PM 02/15/2024    3:37 PM 02/07/2023    3:06 PM 02/05/2023    1:03 PM 12/05/2021    2:55 PM 11/10/2021    2:46 PM 09/24/2019    9:28 AM  PHQ 2/9 Scores  PHQ - 2 Score 0 0 0 0 0 0 6  PHQ- 9 Score       15    Fall Risk    03/21/2024    2:23 PM 06/11/2023    4:08 PM 06/11/2023    2:53 PM 03/19/2023    1:38 PM 02/07/2023    3:06 PM  Fall Risk   Falls in the past year? 1 1 1 1  0  Number falls in past yr: 0 1 1 0 0  Injury with Fall? 0 1 1 0 0  Risk for fall due to :  History of fall(s);Impaired balance/gait  History of fall(s) No Fall Risks  Follow up  Falls evaluation completed Falls evaluation completed Falls evaluation completed Falls evaluation completed    MEDICARE RISK AT HOME: Medicare Risk at Home Any stairs in or around the home?: Yes If so, are there any without handrails?: No  TIMED UP AND GO:  Was the test  performed?  No    Cognitive Function:    05/13/2018    1:02 PM 07/11/2017    9:26 AM 03/14/2016    3:48 PM  MMSE - Mini Mental State Exam  Orientation to time 5 3 4   Orientation to Place 5 4 4   Registration 3 3 3   Attention/ Calculation 5 5 5   Recall 2 2 2   Language- name 2 objects 2 2 2   Language- repeat 1 1 1   Language- follow 3 step command 3 3 3   Language- read & follow  direction 1 1 1   Write a sentence 1 1 1   Copy design 1 1 1   Total score 29 26 27         03/21/2024    2:24 PM 02/07/2023    3:07 PM 11/10/2021    2:53 PM 05/19/2019    3:01 PM  6CIT Screen  What Year? 0 points 4 points 0 points 0 points  What month? 0 points 0 points 0 points 0 points  What time? 0 points 0 points 0 points 0 points  Count back from 20 0 points 0 points 0 points 0 points  Months in reverse 4 points 0 points 2 points 0 points  Repeat phrase 6 points 4 points 6 points 4 points  Total Score 10 points 8 points 8 points 4 points    Immunizations Immunization History  Administered Date(s) Administered   Fluad Quad(high Dose 65+) 12/05/2021, 02/05/2023   Influenza, High Dose Seasonal PF 07/11/2017, 10/01/2018, 09/16/2019   Influenza,inj,Quad PF,6+ Mos 08/18/2015, 12/13/2016   PFIZER(Purple Top)SARS-COV-2 Vaccination 01/20/2020, 02/20/2020, 11/10/2020, 03/19/2021   Pfizer(Comirnaty)Fall Seasonal Vaccine 12 years and older 03/01/2023   Pneumococcal Conjugate-13 08/18/2015   Pneumococcal Polysaccharide-23 07/11/2017   Tdap 03/19/2021   Zoster Recombinant(Shingrix) 08/21/2017, 02/05/2018   Zoster, Live 03/16/2015    TDAP status: Up to date  Flu Vaccine status: Up to date  Pneumococcal vaccine status: Up to date  Covid-19 vaccine status: Information provided on how to obtain vaccines.   Qualifies for Shingles Vaccine? Yes   Zostavax completed No   Shingrix Completed?: Yes  Screening Tests Health Maintenance  Topic Date Due   OPHTHALMOLOGY EXAM  09/06/2022   COVID-19 Vaccine (6 - 2024-25 season) 07/15/2023   Diabetic kidney evaluation - Urine ACR  02/05/2024   HEMOGLOBIN A1C  06/08/2024   INFLUENZA VACCINE  06/13/2024   Diabetic kidney evaluation - eGFR measurement  12/09/2024   FOOT EXAM  02/14/2025   Medicare Annual Wellness (AWV)  03/21/2025   DTaP/Tdap/Td (2 - Td or Tdap) 03/20/2031   Pneumonia Vaccine 65+ Years old  Completed   DEXA SCAN  Completed    Zoster Vaccines- Shingrix  Completed   HPV VACCINES  Aged Out   Meningococcal B Vaccine  Aged Out   Hepatitis C Screening  Discontinued    Health Maintenance  Health Maintenance Due  Topic Date Due   OPHTHALMOLOGY EXAM  09/06/2022   COVID-19 Vaccine (6 - 2024-25 season) 07/15/2023   Diabetic kidney evaluation - Urine ACR  02/05/2024    Colorectal cancer screening: No longer required.   Mammogram status: No longer required due to age.   Lung Cancer Screening: (Low Dose CT Chest recommended if Age 6-80 years, 20 pack-year currently smoking OR have quit w/in 15years.) does not qualify.   Lung Cancer Screening Referral: na  Additional Screening:  Hepatitis C Screening: does not qualify  Vision Screening: Recommended annual ophthalmology exams for early detection of glaucoma and other disorders  of the eye. Is the patient up to date with their annual eye exam?  Yes  Who is the provider or what is the name of the office in which the patient attends annual eye exams? [facility] If pt is not established with a provider, would they like to be referred to a provider to establish care? No .   Dental Screening: Recommended annual dental exams for proper oral hygiene  Diabetic Foot Exam: Diabetic Foot Exam: Completed 02/15/2024  Community Resource Referral / Chronic Care Management: CRR required this visit?  No   CCM required this visit?  No     Plan:     I have personally reviewed and noted the following in the patient's chart:   Medical and social history Use of alcohol , tobacco or illicit drugs  Current medications and supplements including opioid prescriptions. Patient is not currently taking opioid prescriptions. Functional ability and status Nutritional status Physical activity Advanced directives List of other physicians Hospitalizations, surgeries, and ER visits in previous 12 months Vitals Screenings to include cognitive, depression, and falls Referrals and  appointments  In addition, I have reviewed and discussed with patient certain preventive protocols, quality metrics, and best practice recommendations. A written personalized care plan for preventive services as well as general preventive health recommendations were provided to patient.     Verma Gobble, NP   03/21/2024   After Visit Summary: (MyChart) Due to this being a telephonic visit, the after visit summary with patients personalized plan was offered to patient via MyChart

## 2024-03-25 DIAGNOSIS — Z961 Presence of intraocular lens: Secondary | ICD-10-CM | POA: Diagnosis not present

## 2024-03-25 DIAGNOSIS — H5203 Hypermetropia, bilateral: Secondary | ICD-10-CM | POA: Diagnosis not present

## 2024-03-25 DIAGNOSIS — H18513 Endothelial corneal dystrophy, bilateral: Secondary | ICD-10-CM | POA: Diagnosis not present

## 2024-03-25 DIAGNOSIS — H524 Presbyopia: Secondary | ICD-10-CM | POA: Diagnosis not present

## 2024-03-25 DIAGNOSIS — H52223 Regular astigmatism, bilateral: Secondary | ICD-10-CM | POA: Diagnosis not present

## 2024-04-09 ENCOUNTER — Emergency Department (HOSPITAL_COMMUNITY)

## 2024-04-09 ENCOUNTER — Emergency Department (HOSPITAL_COMMUNITY): Admit: 2024-04-09 | Admitting: Cardiovascular Disease

## 2024-04-09 ENCOUNTER — Inpatient Hospital Stay (HOSPITAL_COMMUNITY)
Admission: EM | Admit: 2024-04-09 | Discharge: 2024-04-12 | DRG: 281 | Disposition: A | Discharge status: Home Health | Attending: Cardiovascular Disease | Admitting: Cardiovascular Disease

## 2024-04-09 ENCOUNTER — Encounter (HOSPITAL_COMMUNITY)
Admission: EM | Disposition: A | Discharge status: Home Health | Payer: Self-pay | Source: Home / Self Care | Attending: Cardiovascular Disease

## 2024-04-09 DIAGNOSIS — E785 Hyperlipidemia, unspecified: Secondary | ICD-10-CM | POA: Diagnosis present

## 2024-04-09 DIAGNOSIS — K219 Gastro-esophageal reflux disease without esophagitis: Secondary | ICD-10-CM | POA: Diagnosis present

## 2024-04-09 DIAGNOSIS — Z79899 Other long term (current) drug therapy: Secondary | ICD-10-CM

## 2024-04-09 DIAGNOSIS — G4489 Other headache syndrome: Secondary | ICD-10-CM | POA: Diagnosis not present

## 2024-04-09 DIAGNOSIS — I2510 Atherosclerotic heart disease of native coronary artery without angina pectoris: Secondary | ICD-10-CM | POA: Diagnosis present

## 2024-04-09 DIAGNOSIS — Z5309 Procedure and treatment not carried out because of other contraindication: Secondary | ICD-10-CM | POA: Diagnosis present

## 2024-04-09 DIAGNOSIS — Z982 Presence of cerebrospinal fluid drainage device: Secondary | ICD-10-CM | POA: Diagnosis not present

## 2024-04-09 DIAGNOSIS — Z8679 Personal history of other diseases of the circulatory system: Secondary | ICD-10-CM | POA: Diagnosis not present

## 2024-04-09 DIAGNOSIS — R0902 Hypoxemia: Secondary | ICD-10-CM | POA: Diagnosis not present

## 2024-04-09 DIAGNOSIS — R9431 Abnormal electrocardiogram [ECG] [EKG]: Secondary | ICD-10-CM | POA: Diagnosis not present

## 2024-04-09 DIAGNOSIS — Z950 Presence of cardiac pacemaker: Secondary | ICD-10-CM

## 2024-04-09 DIAGNOSIS — I495 Sick sinus syndrome: Secondary | ICD-10-CM | POA: Diagnosis present

## 2024-04-09 DIAGNOSIS — I771 Stricture of artery: Secondary | ICD-10-CM | POA: Diagnosis not present

## 2024-04-09 DIAGNOSIS — J439 Emphysema, unspecified: Secondary | ICD-10-CM | POA: Diagnosis not present

## 2024-04-09 DIAGNOSIS — I213 ST elevation (STEMI) myocardial infarction of unspecified site: Secondary | ICD-10-CM | POA: Diagnosis not present

## 2024-04-09 DIAGNOSIS — Z833 Family history of diabetes mellitus: Secondary | ICD-10-CM | POA: Diagnosis not present

## 2024-04-09 DIAGNOSIS — Z604 Social exclusion and rejection: Secondary | ICD-10-CM | POA: Diagnosis present

## 2024-04-09 DIAGNOSIS — I2111 ST elevation (STEMI) myocardial infarction involving right coronary artery: Secondary | ICD-10-CM | POA: Diagnosis not present

## 2024-04-09 DIAGNOSIS — Z90711 Acquired absence of uterus with remaining cervical stump: Secondary | ICD-10-CM | POA: Diagnosis not present

## 2024-04-09 DIAGNOSIS — J449 Chronic obstructive pulmonary disease, unspecified: Secondary | ICD-10-CM | POA: Diagnosis present

## 2024-04-09 DIAGNOSIS — Z87891 Personal history of nicotine dependence: Secondary | ICD-10-CM

## 2024-04-09 DIAGNOSIS — R739 Hyperglycemia, unspecified: Secondary | ICD-10-CM | POA: Diagnosis not present

## 2024-04-09 DIAGNOSIS — Z923 Personal history of irradiation: Secondary | ICD-10-CM | POA: Diagnosis not present

## 2024-04-09 DIAGNOSIS — Z853 Personal history of malignant neoplasm of breast: Secondary | ICD-10-CM

## 2024-04-09 DIAGNOSIS — I2119 ST elevation (STEMI) myocardial infarction involving other coronary artery of inferior wall: Secondary | ICD-10-CM | POA: Diagnosis present

## 2024-04-09 DIAGNOSIS — R079 Chest pain, unspecified: Secondary | ICD-10-CM | POA: Diagnosis not present

## 2024-04-09 DIAGNOSIS — E119 Type 2 diabetes mellitus without complications: Secondary | ICD-10-CM | POA: Diagnosis not present

## 2024-04-09 DIAGNOSIS — E871 Hypo-osmolality and hyponatremia: Secondary | ICD-10-CM | POA: Diagnosis not present

## 2024-04-09 DIAGNOSIS — I080 Rheumatic disorders of both mitral and aortic valves: Secondary | ICD-10-CM | POA: Diagnosis present

## 2024-04-09 DIAGNOSIS — I10 Essential (primary) hypertension: Secondary | ICD-10-CM | POA: Diagnosis not present

## 2024-04-09 DIAGNOSIS — I700 Atherosclerosis of aorta: Secondary | ICD-10-CM | POA: Diagnosis not present

## 2024-04-09 HISTORY — PX: LEFT HEART CATH AND CORONARY ANGIOGRAPHY: CATH118249

## 2024-04-09 HISTORY — PX: CORONARY/GRAFT ACUTE MI REVASCULARIZATION: CATH118305

## 2024-04-09 LAB — CG4 I-STAT (LACTIC ACID): Lactic Acid, Venous: 1.4 mmol/L (ref 0.5–1.9)

## 2024-04-09 SURGERY — CORONARY/GRAFT ACUTE MI REVASCULARIZATION
Anesthesia: Moderate Sedation

## 2024-04-09 MED ORDER — SODIUM CHLORIDE 0.9 % IV SOLN: INTRAVENOUS | Status: AC

## 2024-04-09 MED ORDER — LIDOCAINE HCL (PF) 1 % IJ SOLN
INTRAMUSCULAR | Status: AC
  Filled 2024-04-09: qty 30

## 2024-04-09 MED ORDER — HEPARIN SODIUM (PORCINE) 5000 UNIT/ML IJ SOLN
4000.0000 [IU] | Freq: Once | INTRAMUSCULAR | Status: AC
  Administered 2024-04-09: 4000 [IU] via INTRAVENOUS

## 2024-04-09 MED ORDER — HEPARIN SODIUM (PORCINE) 1000 UNIT/ML IJ SOLN
INTRAMUSCULAR | Status: AC
  Filled 2024-04-09: qty 10

## 2024-04-09 MED ORDER — MIDAZOLAM HCL 2 MG/2ML IJ SOLN
INTRAMUSCULAR | Status: DC | PRN
  Administered 2024-04-09: 1 mg via INTRAVENOUS

## 2024-04-09 MED ORDER — LIDOCAINE HCL (PF) 1 % IJ SOLN
INTRAMUSCULAR | Status: DC | PRN
Start: 2024-04-09 — End: 2024-04-10
  Administered 2024-04-09: 2 mL
  Administered 2024-04-09: 15 mL

## 2024-04-09 MED ORDER — MIDAZOLAM HCL 2 MG/2ML IJ SOLN
INTRAMUSCULAR | Status: AC
  Filled 2024-04-09: qty 2

## 2024-04-09 MED ORDER — FENTANYL CITRATE (PF) 100 MCG/2ML IJ SOLN
INTRAMUSCULAR | Status: DC | PRN
  Administered 2024-04-09: 25 ug via INTRAVENOUS

## 2024-04-09 MED ORDER — FENTANYL CITRATE (PF) 100 MCG/2ML IJ SOLN
INTRAMUSCULAR | Status: AC
Start: 2024-04-09 — End: ?
  Filled 2024-04-09: qty 2

## 2024-04-09 MED ORDER — VERAPAMIL HCL 2.5 MG/ML IV SOLN
INTRAVENOUS | Status: AC
Start: 2024-04-09 — End: ?
  Filled 2024-04-09: qty 2

## 2024-04-09 SURGICAL SUPPLY — 16 items
BALLOON EMERGE MR 2.0X12 (BALLOONS) IMPLANT
BALLOON SPRINTER MX OTW 2.0X12 (BALLOONS) IMPLANT
CATH INFINITI 5FR MULTPACK ANG (CATHETERS) IMPLANT
CATH VISTA GUIDE 6FR JR4 ECOPK (CATHETERS) IMPLANT
CLOSURE PERCLOSE PROSTYLE (VASCULAR PRODUCTS) IMPLANT
GLIDESHEATH SLEND SS 6F .021 (SHEATH) IMPLANT
GUIDEWIRE INQWIRE 1.5J.035X260 (WIRE) IMPLANT
KIT ENCORE 26 ADVANTAGE (KITS) IMPLANT
KIT MICROPUNCTURE NIT STIFF (SHEATH) IMPLANT
KIT SYRINGE INJ CVI SPIKEX1 (MISCELLANEOUS) IMPLANT
PACK CARDIAC CATHETERIZATION (CUSTOM PROCEDURE TRAY) ×1 IMPLANT
SET ATX-X65L (MISCELLANEOUS) IMPLANT
SHEATH PINNACLE 6F 10CM (SHEATH) IMPLANT
SHEATH PROBE COVER 6X72 (BAG) IMPLANT
WIRE ASAHI PROWATER 180CM (WIRE) IMPLANT
WIRE HI TORQ WHISPER MS 300CM (WIRE) IMPLANT

## 2024-04-09 NOTE — ED Notes (Signed)
 Cards at the bedside

## 2024-04-09 NOTE — ED Notes (Signed)
 4000Units heparin  given via 18G LAC by primary, RN

## 2024-04-09 NOTE — Progress Notes (Signed)
 Remote pacemaker transmission.

## 2024-04-09 NOTE — H&P (Signed)
 Cardiology Admission History and Physical   Patient ID: Mary Higgins MRN: [id number]; DOB: 1941/02/17   Admission date: 04/09/2024  PCP:  Verma Gobble, NP   [facility] Providers Cardiologist:  None  Cardiology APP:  Mel Spine, NP (Inactive)  {   Chief Complaint:  chest pain  Patient Profile:   Mary Higgins is a 83 y.o. female with T2DM, HTN, HLD, SSS s/p DC-PPM, tobacco use, COPD,  who is being seen 04/09/2024 for the evaluation of chest pain and concern of STEMI.  History of Present Illness:   Mary Higgins reports that she had dinner last night at around [facility]. Following that, she started to have sharp, retrosternal chest pain with radiation to the left arm associated with nausea and vomiting. No prior similar episodes. No prior hx of CAD or cath. EKG showed STE in the inferior leads. She received ASA 324 mg with EMS and 4000 units of IV heparin . Cath was activated.   Past Medical History:  Diagnosis Date   Anxiety and depression    Hx of   Blepharospasm     Breast cancer (HCC) 1987   s/p lumpectomy.   Carotid bruit    Bilateral. Mild to moderate disease on LEFT and mild on the RIGHT in 2012   COPD (chronic obstructive pulmonary disease) (HCC)    mild. Not on inhalers.   Diabetes mellitus without complication (HCC)    AODM   Fuchs' corneal dystrophy    GERD (gastroesophageal reflux disease)     Headache    Hyperlipidemia    Hypertension     Insomnia     Intracerebral bleed (HCC) 2003   Remote intracerebral blled and coiling by Dr. Alvira Josephs for cerebral aneurysm and a shunt placed by Dr. Nigel Bart remotely. No seizure disorder and this happened in 2003 with an anterior communicating aneurysm.   Meningioma (HCC)    left parafalcine calcified meningioma   Neurocardiogenic syncope    a. s/p Biotronik dual chamber pacemaker   Pacemaker    Personal history of radiation therapy    S/P VP shunt 2003   Sinus node dysfunction (HCC)     Tobacco  abuse     Type II or unspecified type diabetes mellitus without mention of complication, not stated as uncontrolled      Past Surgical History:  Procedure Laterality Date   ANEURYSM COILING  11/13/2001   Cerebral aneurysm ;Dr. Luellen Sages   BREAST LUMPECTOMY Right    For cancer, no recurrence   CEREBRAL ANEURYSM REPAIR  2023   CHOLECYSTECTOMY     CRANIOTOMY Right 08/04/2021   Procedure: BURR HOLES FOR SUBDURAL HEMATOMA;  Surgeon: Cannon Champion, MD;  Location: [facility] OR;  Service: Neurosurgery;  Laterality: Right;   IR ANGIO INTRA EXTRACRAN SEL INTERNAL CAROTID UNI R MOD SED  09/30/2021   IR ANGIO VERTEBRAL SEL VERTEBRAL UNI R MOD SED  09/30/2021   IR TRANSCATH/EMBOLIZ  09/30/2021   PACEMAKER GENERATOR CHANGE N/A 09/25/2014   BTK dual chamber pacemkaer implanted by Dr Nunzio Belch   PACEMAKER INSERTION  07/12/2006   St. Jude Victory XL DR  -  Dr. Silvestre Drum   PARTIAL HYSTERECTOMY     RADIOLOGY WITH ANESTHESIA N/A 09/30/2021   Procedure: Arteriogram, Onyx embolization of right middle meningeal artery;  Surgeon: Augusto Blonder, MD;  Location: [facility] OR;  Service: Radiology;  Laterality: N/A;   VENTRICULOPERITONEAL SHUNT  11/13/2001   Dr. Manya Sells     Medications Prior to Admission:  Prior to Admission medications   Medication Sig Start Date End Date Taking? Authorizing Provider  Artificial Tear Ointment (DRY EYES OP) Place 1 drop into both eyes daily as needed (dry eyes).    [provider]  Blood Glucose Monitoring Suppl (ONE TOUCH ULTRA 2) w/Device KIT USE UP TO FOUR TIMES DAILY AS DIRECTED 02/16/21   Eubanks, Jessica K, NP  clonazePAM  (KLONOPIN ) 1 MG tablet Take 1 tablet (1 mg total) by mouth 3 (three) times daily. 02/15/24   Verma Gobble, NP  gabapentin  (NEURONTIN ) 100 MG capsule Take 1 capsule (100 mg total) by mouth at bedtime. 06/11/23   Festus Hubert, Adam R, DO  losartan  (COZAAR ) 100 MG tablet TAKE 1/2 TABLET BY MOUTH IN THE MORNING AND IN THE EVENING 09/12/23   Verma Gobble, NP  mirtazapine  (REMERON ) 15 MG tablet TAKE 1 TABLET(15 MG) BY MOUTH AT BEDTIME 01/18/24   Eubanks, Jessica K, NP  pantoprazole  (PROTONIX ) 40 MG tablet TAKE 1 TABLET(40 MG) BY MOUTH DAILY 08/17/23   Eubanks, Jessica K, NP     Allergies:    Allergies  Allergen Reactions   Oxaprozin Rash    Social History:   Social History   Socioeconomic History   Marital status: Legally Separated    Spouse name: Not on file   Number of children: Not on file   Years of education: Not on file   Highest education level: Not on file  Occupational History   Not on file  Tobacco Use   Smoking status: Some Days    Types: Cigarettes    Start date: 01/12/2019   Smokeless tobacco: Never   Tobacco comments:    1 cig weekly sometimes will go longer than 1 week w/o smoking   Vaping Use   Vaping status: Never Used  Substance and Sexual Activity   Alcohol  use: No    Alcohol /week: 0.0 standard drinks of alcohol    Drug use: Never   Sexual activity: Not Currently  Other Topics Concern   Not on file  Social History Narrative   Not on file   Social Drivers of Health   Financial Resource Strain: Low Risk  (05/13/2018)   Overall Financial Resource Strain (CARDIA)    Difficulty of Paying Living Expenses: Not hard at all  Food Insecurity: No Food Insecurity (02/15/2024)   Hunger Vital Sign    Worried About Running Out of Food in the Last Year: Never true    Ran Out of Food in the Last Year: Never true  Transportation Needs: No Transportation Needs (02/15/2024)   PRAPARE - Administrator, Civil Service (Medical): No    Lack of Transportation (Non-Medical): No  Physical Activity: Inactive (05/13/2018)   Exercise Vital Sign    Days of Exercise per Week: 0 days    Minutes of Exercise per Session: 0 min  Stress: No Stress Concern Present (05/13/2018)   [employer] of Occupational Health - Occupational Stress Questionnaire    Feeling of Stress : Only a little  Social Connections: Socially  Isolated (02/15/2024)   Social Connection and Isolation Panel [NHANES]    Frequency of Communication with Friends and Family: Never    Frequency of Social Gatherings with Friends and Family: Never    Attends Religious Services: Never    Database administrator or Organizations: Yes    Attends Banker Meetings: Never    Marital Status: Widowed  Intimate Partner Violence: Not At Risk (02/15/2024)   Humiliation, Afraid, Rape,  and Kick questionnaire    Fear of Current or Ex-Partner: No    Emotionally Abused: No    Physically Abused: No    Sexually Abused: No    Family History:   The patient's family history includes Cancer in her mother; Diabetes in her son; Heart attack in her father and son; Hypertension in an other family member.    ROS:  Please see the history of present illness.  All other ROS reviewed and negative.     Physical Exam/Data:  There were no vitals filed for this visit. No intake or output data in the 24 hours ending 04/09/24 2358    02/15/2024    3:31 PM 06/11/2023    4:09 PM 06/11/2023    2:52 PM  Last 3 Weights  Weight (lbs) 131 lb 12.8 oz 127 lb 127 lb  Weight (kg) 59.784 kg 57.607 kg 57.607 kg     There is no height or weight on file to calculate BMI.  General:  Well nourished, well developed, in no acute distress HEENT: normal Neck: no JVD Vascular: No carotid bruits; Distal pulses 2+ bilaterally   Cardiac:  normal S1, S2; RRR; no murmur  Lungs:  clear to auscultation bilaterally, no wheezing, rhonchi or rales  Abd: soft, nontender, no hepatomegaly  Ext: no edema Musculoskeletal:  No deformities, BUE and BLE strength normal and equal Skin: warm and dry  Neuro:  CNs 2-12 intact, no focal abnormalities noted Psych:  Normal affect    EKG:  The ECG that was done  was personally reviewed and demonstrates inferior STEMI  Relevant CV Studies:   Laboratory Data:  High Sensitivity Troponin:  No results for input(s): "TROPONINIHS" in the last 720  hours.    ChemistryNo results for input(s): "NA", "K", "CL", "CO2", "GLUCOSE", "BUN", "CREATININE", "CALCIUM ", "MG", "GFRNONAA", "GFRAA", "ANIONGAP" in the last 168 hours.  No results for input(s): "PROT", "ALBUMIN", "AST", "ALT", "ALKPHOS", "BILITOT" in the last 168 hours. Lipids No results for input(s): "CHOL", "TRIG", "HDL", "LABVLDL", "LDLCALC", "CHOLHDL" in the last 168 hours. HematologyNo results for input(s): "WBC", "RBC", "HGB", "HCT", "MCV", "MCH", "MCHC", "RDW", "PLT" in the last 168 hours. Thyroid  No results for input(s): "TSH", "FREET4" in the last 168 hours. BNPNo results for input(s): "BNP", "PROBNP" in the last 168 hours.  DDimer No results for input(s): "DDIMER" in the last 168 hours.   Radiology/Studies:  No results found.   Assessment and Plan:   Inferior STEMI Acute onset chest pain with EKG showing ST elevation in leads III and aVF.  - Emergent coronary angiogram  - S/p ASA 324 mg. Continue ASA 81 mg daily. - Hold off on P2Y12i pending LHC results  - Start atorvastatin  80 mg daily - Lipid panel and A1C - TTE  2. HTN - Continue losartan  50 mg BID  3. T2DM - SSI   Risk Assessment/Risk Scores:    TIMI Risk Score for ST  Elevation MI:   The patient's TIMI risk score is 5, which indicates a 12.4% risk of all cause mortality at 30 days.{       Code Status: Full Code  Severity of Illness: The appropriate patient status for this patient is INPATIENT. Inpatient status is judged to be reasonable and necessary in order to provide the required intensity of service to ensure the patient's safety. The patient's presenting symptoms, physical exam findings, and initial radiographic and laboratory data in the context of their chronic comorbidities is felt to place them at high risk for further  clinical deterioration. Furthermore, it is not anticipated that the patient will be medically stable for discharge from the hospital within 2 midnights of admission.   * I  certify that at the point of admission it is my clinical judgment that the patient will require inpatient hospital care spanning beyond 2 midnights from the point of admission due to high intensity of service, high risk for further deterioration and high frequency of surveillance required.*   For questions or updates, please contact [facility] Please consult [email or web address] for contact info under     Signed, Finas Huger, MD  04/09/2024 11:58 PM

## 2024-04-09 NOTE — ED Provider Notes (Signed)
 [facility]IOV[facility] Provider Note  CSN: [id number] Arrival date & time: 04/09/24 2329  Chief Complaint(s) Code STEMI  HPI Mary Higgins is a 83 y.o. female with a past medical history listed below who presents to the emergency department as a code STEMI activated by EMS.  EMS was called out due to persistent chest pain that began shortly after dinner this afternoon associated with several bouts of nonbloody nonbilious emesis.  Chest pain persisted prompting the call.  Patient was given 324 mg of aspirin and route.  No recent fevers or infections.  No other physical complaints.  HPI  Past Medical History Past Medical History:  Diagnosis Date   Anxiety and depression    Hx of   Blepharospasm     Breast cancer (HCC) 1987   s/p lumpectomy.   Carotid bruit    Bilateral. Mild to moderate disease on LEFT and mild on the RIGHT in 2012   COPD (chronic obstructive pulmonary disease) (HCC)    mild. Not on inhalers.   Diabetes mellitus without complication (HCC)    AODM   Fuchs' corneal dystrophy    GERD (gastroesophageal reflux disease)     Headache    Hyperlipidemia    Hypertension     Insomnia     Intracerebral bleed (HCC) 2003   Remote intracerebral blled and coiling by Dr. Alvira Josephs for cerebral aneurysm and a shunt placed by Dr. Nigel Bart remotely. No seizure disorder and this happened in 2003 with an anterior communicating aneurysm.   Meningioma (HCC)    left parafalcine calcified meningioma   Neurocardiogenic syncope    a. s/p Biotronik dual chamber pacemaker   Pacemaker    Personal history of radiation therapy    S/P VP shunt 2003   Sinus node dysfunction (HCC)     Tobacco abuse     Type II or unspecified type diabetes mellitus without mention of complication, not stated as uncontrolled     Patient Active Problem List   Diagnosis Date Noted   Acute ST elevation myocardial infarction (STEMI) of inferior wall (HCC) 04/10/2024   STEMI (ST elevation myocardial  infarction) (HCC) 04/09/2024   Syncope 09/21/2022   S/P coil embolization of cerebral aneurysm 09/30/2021   Subdural hemorrhage (HCC) 08/04/2021   Meningioma (HCC)    Subdural hematoma (HCC) 08/03/2021   Midline shift of brain 08/03/2021   Pacemaker 08/03/2021   Solitary pulmonary nodule 08/03/2021   Headache 07/27/2021   Hyponatremia 02/07/2017   Diarrhea 12/13/2016   Actinic keratosis 12/13/2016   Fall at home, initial encounter 12/13/2016   Cervicalgia 08/18/2015   Onychomycosis 01/20/2015   Hyperlipidemia 01/20/2015   Syncope and collapse 09/10/2014   Insomnia 03/18/2014   Depression due to head injury 03/18/2014   Flank pain 03/18/2014   Blepharospasm 03/18/2014   GERD (gastroesophageal reflux disease) 03/18/2014   Primary hypertension 03/18/2014   Type 2 diabetes mellitus with hyperglycemia, without long-term current use of insulin  (HCC) 03/18/2014   Breast mass 03/18/2014   Abnormality of gait 03/18/2014   History of fall 03/18/2014   Other malaise and fatigue 03/18/2014   Major depressive disorder with single episode 03/18/2014   Sinus node dysfunction (HCC) 12/21/2013   Tobacco abuse 12/21/2013   Edema 12/21/2013   Neurocardiogenic syncope    Subarachnoid hemorrhage (HCC) 06/01/2002   History of cerebral aneurysm 06/01/2002   S/P VP shunt 2003   Home Medication(s) Prior to Admission medications   Medication Sig Start Date End Date Taking? Authorizing Provider  Artificial Tear Ointment (DRY EYES OP) Place 1 drop into both eyes daily as needed (dry eyes).    [provider]  Blood Glucose Monitoring Suppl (ONE TOUCH ULTRA 2) w/Device KIT USE UP TO FOUR TIMES DAILY AS DIRECTED 02/16/21   Eubanks, Jessica K, NP  clonazePAM  (KLONOPIN ) 1 MG tablet Take 1 tablet (1 mg total) by mouth 3 (three) times daily. 02/15/24   Verma Gobble, NP  gabapentin  (NEURONTIN ) 100 MG capsule Take 1 capsule (100 mg total) by mouth at bedtime. 06/11/23   Merriam Abbey, DO  losartan   (COZAAR ) 100 MG tablet TAKE 1/2 TABLET BY MOUTH IN THE MORNING AND IN THE EVENING 09/12/23   Verma Gobble, NP  mirtazapine  (REMERON ) 15 MG tablet TAKE 1 TABLET(15 MG) BY MOUTH AT BEDTIME 01/18/24   Eubanks, Jessica K, NP  pantoprazole  (PROTONIX ) 40 MG tablet TAKE 1 TABLET(40 MG) BY MOUTH DAILY 08/17/23   Eubanks, Jessica K, NP                                                                                                                                    Allergies Oxaprozin  Review of Systems Review of Systems As noted in HPI  Physical Exam Vital Signs  I have reviewed the triage vital signs BP 128/74   Pulse 75   Temp 97.7 F (36.5 C) (Oral)   Resp 13   Ht 5\' 11"  (1.803 m)   Wt 58.7 kg   LMP  (LMP Unknown)   SpO2 99%   BMI 18.05 kg/m   Physical Exam Vitals reviewed.  Constitutional:      General: She is not in acute distress.    Appearance: She is well-developed. She is not diaphoretic.  HENT:     Head: Normocephalic and atraumatic.     Nose: Nose normal.  Eyes:     General: No scleral icterus.       Right eye: No discharge.        Left eye: No discharge.     Conjunctiva/sclera: Conjunctivae normal.     Pupils: Pupils are equal, round, and reactive to light.  Cardiovascular:     Rate and Rhythm: Normal rate and regular rhythm.     Heart sounds: No murmur heard.    No friction rub. No gallop.  Pulmonary:     Effort: Pulmonary effort is normal. No respiratory distress.     Breath sounds: Normal breath sounds. No stridor. No rales.  Abdominal:     General: There is no distension.     Palpations: Abdomen is soft.     Tenderness: There is no abdominal tenderness.  Musculoskeletal:        General: No tenderness.     Cervical back: Normal range of motion and neck supple.  Skin:    General: Skin is warm and dry.     Findings: No erythema or rash.  Neurological:  Mental Status: She is alert and oriented to person, place, and time.     ED Results and  Treatments Labs (all labs ordered are listed, but only abnormal results are displayed) Labs Reviewed  CBC WITH DIFFERENTIAL/PLATELET - Abnormal; Notable for the following components:      Result Value   RBC 3.85 (*)    Hemoglobin 11.6 (*)    HCT 33.6 (*)    All other components within normal limits  COMPREHENSIVE METABOLIC PANEL WITH GFR - Abnormal; Notable for the following components:   CO2 20 (*)    Glucose, Bld 127 (*)    Calcium  7.5 (*)    Total Protein 5.4 (*)    Albumin 3.4 (*)    AST 13 (*)    All other components within normal limits  GLUCOSE, CAPILLARY - Abnormal; Notable for the following components:   Glucose-Capillary 195 (*)    All other components within normal limits  POCT I-STAT, CHEM 8 - Abnormal; Notable for the following components:   Sodium 129 (*)    Chloride 96 (*)    Glucose, Bld 130 (*)    TCO2 21 (*)    Hemoglobin 11.9 (*)    HCT 35.0 (*)    All other components within normal limits  CG4 I-STAT (LACTIC ACID) - Abnormal; Notable for the following components:   Lactic Acid, Venous 0.4 (*)    All other components within normal limits  TROPONIN I (HIGH SENSITIVITY) - Abnormal; Notable for the following components:   Troponin I (High Sensitivity) 39 (*)    All other components within normal limits  HEMOGLOBIN A1C  PROTIME-INR  APTT  LIPID PANEL  BASIC METABOLIC PANEL WITH GFR  CBC  LIPOPROTEIN A (LPA)  I-STAT CG4 LACTIC ACID, ED  CG4 I-STAT (LACTIC ACID)  POCT ACTIVATED CLOTTING TIME  TROPONIN I (HIGH SENSITIVITY)                                                                                                                         EKG  EKG Interpretation Date/Time:  Wednesday Apr 09 2024 23:35:40 EDT Ventricular Rate:  91 PR Interval:  152 QRS Duration:  100 QT Interval:  351 QTC Calculation: 432 R Axis:   14  Text Interpretation: Sinus rhythm Inferior infarct, acute Anterior infarct, acute (LAD) >>> Acute MI <<< Confirmed by Townsend Freud [phone]) on 04/09/2024 11:37:09 PM       Radiology CARDIAC CATHETERIZATION Result Date: 04/10/2024   Prox Cx to Mid Cx lesion is 50% stenosed.   2nd Mrg lesion is 50% stenosed.   Prox LAD to Dist LAD lesion is 30% stenosed.   Prox RCA lesion is 40% stenosed.   Mid RCA to Dist RCA lesion is 30% stenosed.   RPDA lesion is 100% stenosed. Acute inferior STEMI secondary to thrombotic occlusion of the small to moderate caliber PDA Unsuccessful attempt to cross the PDA (unable to cross due to distal location of lesion. The RCA  is tortuous and calcified) Moderate non-obstructive disease in the LAD and Circumflex LVEDP=25 mmHg Recommendations: Will admit to the ICU. The PDA is closed and filling from left to right collaterals. Will not continue IV heparin . Will continue ASA and Plavix . Continue high intensity statin. Echo tomorrow.    Medications Ordered in ED Medications  0.9 %  sodium chloride  infusion (has no administration in time range)  losartan  (COZAAR ) tablet 50 mg (50 mg Oral Given 04/10/24 0137)  mirtazapine  (REMERON ) tablet 15 mg (has no administration in time range)  pantoprazole  (PROTONIX ) EC tablet 40 mg (has no administration in time range)  aspirin  EC tablet 81 mg (has no administration in time range)  atorvastatin  (LIPITOR) tablet 80 mg (80 mg Oral Given 04/10/24 0137)  nitroGLYCERIN  (NITROSTAT ) SL tablet 0.4 mg (0.4 mg Sublingual Given 04/10/24 0245)  enoxaparin  (LOVENOX ) injection 40 mg (has no administration in time range)  insulin  aspart (novoLOG ) injection 0-9 Units (has no administration in time range)  labetalol  (NORMODYNE ) injection 10 mg (has no administration in time range)  hydrALAZINE  (APRESOLINE ) injection 10 mg (has no administration in time range)  acetaminophen  (TYLENOL ) tablet 650 mg (650 mg Oral Given 04/10/24 0244)  ondansetron  (ZOFRAN ) injection 4 mg (has no administration in time range)  0.9 %  sodium chloride  infusion ( Intravenous New Bag/Given 04/10/24 0136)   sodium chloride  flush (NS) 0.9 % injection 3 mL (3 mLs Intravenous Given 04/10/24 0503)  sodium chloride  flush (NS) 0.9 % injection 3 mL (has no administration in time range)  0.9 %  sodium chloride  infusion (has no administration in time range)  clopidogrel  (PLAVIX ) tablet 75 mg (has no administration in time range)  heparin  injection 4,000 Units (4,000 Units Intravenous Given 04/09/24 2334)  famotidine  (PEPCID ) IVPB 20 mg premix (  Duplicate 04/10/24 0504)  morphine  (PF) 2 MG/ML injection 1 mg (1 mg Intravenous Given 04/10/24 0316)  fentaNYL  (SUBLIMAZE ) injection 12.5 mcg (12.5 mcg Intravenous Given 04/10/24 0349)   Procedures .Critical Care  Performed by: Lindle Rhea, MD Authorized by: Lindle Rhea, MD   Critical care provider statement:    Critical care time (minutes):  30   Critical care was necessary to treat or prevent imminent or life-threatening deterioration of the following conditions:  Cardiac failure   Critical care was time spent personally by me on the following activities:  Development of treatment plan with patient or surrogate, discussions with consultants, evaluation of patient's response to treatment, examination of patient, ordering and review of laboratory studies, ordering and review of radiographic studies, ordering and performing treatments and interventions, pulse oximetry, re-evaluation of patient's condition and review of old charts   Care discussed with: admitting provider     (including critical care time) Medical Decision Making / ED Course   Medical Decision Making Amount and/or Complexity of Data Reviewed Labs: ordered. Decision-making details documented in ED Course. Radiology: ordered. ECG/medicine tests: ordered and independent interpretation performed. Decision-making details documented in ED Course.  Risk Prescription drug management. Decision regarding hospitalization. Minor surgery with identified risk factors.    EKG is  concerning for inferior STEMI.  Cardiology fellow and attending at bedside will take patient to Cath Lab. Given heparin .     Final Clinical Impression(s) / ED Diagnoses Final diagnoses:  Acute ST elevation myocardial infarction (STEMI) involving right coronary artery [facility])    This chart was dictated using voice recognition software.  Despite best efforts to proofread,  errors can occur which can change the documentation meaning.  Lindle Rhea, MD 04/10/24 [phone]

## 2024-04-09 NOTE — Addendum Note (Signed)
 Addended by: Lott Rouleau A on: 04/09/2024 09:59 AM   Modules accepted: Orders

## 2024-04-09 NOTE — ED Triage Notes (Signed)
 Pt presents to the ED via [facility] from home with complaints of CP that started around 1830 tonight with associated N/V. Hx of pacemaker for bradycardia. Per EMS, pt had elevated ST on EKG upon their arrival. PTA she received 324mg  ASA. A&Ox4 at this time. Denies SOB.

## 2024-04-10 ENCOUNTER — Telehealth (HOSPITAL_COMMUNITY): Payer: Self-pay | Admitting: Pharmacy Technician

## 2024-04-10 ENCOUNTER — Encounter (HOSPITAL_COMMUNITY): Payer: Self-pay | Admitting: Cardiovascular Disease

## 2024-04-10 ENCOUNTER — Inpatient Hospital Stay (HOSPITAL_COMMUNITY)

## 2024-04-10 ENCOUNTER — Other Ambulatory Visit (HOSPITAL_COMMUNITY): Payer: Self-pay

## 2024-04-10 DIAGNOSIS — I2111 ST elevation (STEMI) myocardial infarction involving right coronary artery: Secondary | ICD-10-CM

## 2024-04-10 DIAGNOSIS — R079 Chest pain, unspecified: Secondary | ICD-10-CM

## 2024-04-10 DIAGNOSIS — I2510 Atherosclerotic heart disease of native coronary artery without angina pectoris: Secondary | ICD-10-CM

## 2024-04-10 DIAGNOSIS — I2119 ST elevation (STEMI) myocardial infarction involving other coronary artery of inferior wall: Secondary | ICD-10-CM | POA: Diagnosis present

## 2024-04-10 LAB — COMPREHENSIVE METABOLIC PANEL WITH GFR
ALT: 9 U/L (ref 0–44)
AST: 13 U/L — ABNORMAL LOW (ref 15–41)
Albumin: 3.4 g/dL — ABNORMAL LOW (ref 3.5–5.0)
Alkaline Phosphatase: 51 U/L (ref 38–126)
Anion gap: 8 (ref 5–15)
BUN: 12 mg/dL (ref 8–23)
CO2: 20 mmol/L — ABNORMAL LOW (ref 22–32)
Calcium: 7.5 mg/dL — ABNORMAL LOW (ref 8.9–10.3)
Chloride: 107 mmol/L (ref 98–111)
Creatinine, Ser: 0.54 mg/dL (ref 0.44–1.00)
GFR, Estimated: >60 mL/min (ref >60)
Glucose, Bld: 127 mg/dL — ABNORMAL HIGH (ref 70–99)
Potassium: 3.5 mmol/L (ref 3.5–5.1)
Sodium: 135 mmol/L (ref 135–145)
Total Bilirubin: 0.7 mg/dL (ref 0.0–1.2)
Total Protein: 5.4 g/dL — ABNORMAL LOW (ref 6.5–8.1)

## 2024-04-10 LAB — POCT I-STAT, CHEM 8
BUN: 14 mg/dL (ref 8–23)
Calcium, Ion: 1.17 mmol/L (ref 1.15–1.40)
Chloride: 96 mmol/L — ABNORMAL LOW (ref 98–111)
Creatinine, Ser: 0.6 mg/dL (ref 0.44–1.00)
Glucose, Bld: 130 mg/dL — ABNORMAL HIGH (ref 70–99)
HCT: 35 % — ABNORMAL LOW (ref 36.0–46.0)
Hemoglobin: 11.9 g/dL — ABNORMAL LOW (ref 12.0–15.0)
Potassium: 3.9 mmol/L (ref 3.5–5.1)
Sodium: 129 mmol/L — ABNORMAL LOW (ref 135–145)
TCO2: 21 mmol/L — ABNORMAL LOW (ref 22–32)

## 2024-04-10 LAB — ECHOCARDIOGRAM COMPLETE
AR max vel: 2.9 cm2
AV Peak grad: 5.4 mmHg
Ao pk vel: 1.16 m/s
Area-P 1/2: 3.37 cm2
Height: 71 in
S' Lateral: 2.7 cm
Weight: 2070.56 [oz_av]

## 2024-04-10 LAB — CBC WITH DIFFERENTIAL/PLATELET
Abs Immature Granulocytes: 0.04 K/uL (ref 0.00–0.07)
Basophils Absolute: 0.1 K/uL (ref 0.0–0.1)
Basophils Relative: 1 %
Eosinophils Absolute: 0 K/uL (ref 0.0–0.5)
Eosinophils Relative: 0 %
HCT: 33.6 % — ABNORMAL LOW (ref 36.0–46.0)
Hemoglobin: 11.6 g/dL — ABNORMAL LOW (ref 12.0–15.0)
Immature Granulocytes: 0 %
Lymphocytes Relative: 11 %
Lymphs Abs: 1 K/uL (ref 0.7–4.0)
MCH: 30.1 pg (ref 26.0–34.0)
MCHC: 34.5 g/dL (ref 30.0–36.0)
MCV: 87.3 fL (ref 80.0–100.0)
Monocytes Absolute: 0.5 K/uL (ref 0.1–1.0)
Monocytes Relative: 6 %
Neutro Abs: 7.6 K/uL (ref 1.7–7.7)
Neutrophils Relative %: 82 %
Platelets: 211 K/uL (ref 150–400)
RBC: 3.85 MIL/uL — ABNORMAL LOW (ref 3.87–5.11)
RDW: 13.5 % (ref 11.5–15.5)
WBC: 9.3 K/uL (ref 4.0–10.5)
nRBC: 0 % (ref 0.0–0.2)

## 2024-04-10 LAB — LIPID PANEL
Cholesterol: 137 mg/dL (ref 0–200)
HDL: 42 mg/dL
LDL Cholesterol: 81 mg/dL (ref 0–99)
Total CHOL/HDL Ratio: 3.3 ratio
Triglycerides: 68 mg/dL
VLDL: 14 mg/dL (ref 0–40)

## 2024-04-10 LAB — BASIC METABOLIC PANEL WITH GFR
Anion gap: 6 (ref 5–15)
BUN: 13 mg/dL (ref 8–23)
CO2: 22 mmol/L (ref 22–32)
Calcium: 9 mg/dL (ref 8.9–10.3)
Chloride: 101 mmol/L (ref 98–111)
Creatinine, Ser: 0.66 mg/dL (ref 0.44–1.00)
GFR, Estimated: >60 mL/min (ref >60)
Glucose, Bld: 167 mg/dL — ABNORMAL HIGH (ref 70–99)
Potassium: 3.9 mmol/L (ref 3.5–5.1)
Sodium: 129 mmol/L — ABNORMAL LOW (ref 135–145)

## 2024-04-10 LAB — GLUCOSE, CAPILLARY
Glucose-Capillary: 195 mg/dL — ABNORMAL HIGH (ref 70–99)
Glucose-Capillary: 185 mg/dL — ABNORMAL HIGH (ref 70–99)
Glucose-Capillary: 119 mg/dL — ABNORMAL HIGH (ref 70–99)
Glucose-Capillary: 164 mg/dL — ABNORMAL HIGH (ref 70–99)
Glucose-Capillary: 130 mg/dL — ABNORMAL HIGH (ref 70–99)
Glucose-Capillary: 154 mg/dL — ABNORMAL HIGH (ref 70–99)

## 2024-04-10 LAB — CBC
HCT: 34 % — ABNORMAL LOW (ref 36.0–46.0)
Hemoglobin: 11.7 g/dL — ABNORMAL LOW (ref 12.0–15.0)
MCH: 29.8 pg (ref 26.0–34.0)
MCHC: 34.4 g/dL (ref 30.0–36.0)
MCV: 86.5 fL (ref 80.0–100.0)
Platelets: 255 10*3/uL (ref 150–400)
RBC: 3.93 MIL/uL (ref 3.87–5.11)
RDW: 13.4 % (ref 11.5–15.5)
WBC: 10.8 10*3/uL — ABNORMAL HIGH (ref 4.0–10.5)
nRBC: 0 % (ref 0.0–0.2)

## 2024-04-10 LAB — PROTIME-INR
INR: 1 (ref 0.8–1.2)
Prothrombin Time: 13.6 s (ref 11.4–15.2)

## 2024-04-10 LAB — HEMOGLOBIN A1C
Hgb A1c MFr Bld: 5.4 % (ref 4.8–5.6)
Mean Plasma Glucose: 108.28 mg/dL

## 2024-04-10 LAB — CG4 I-STAT (LACTIC ACID): Lactic Acid, Venous: 0.4 mmol/L — ABNORMAL LOW (ref 0.5–1.9)

## 2024-04-10 LAB — POCT ACTIVATED CLOTTING TIME: Activated Clotting Time: 302 s

## 2024-04-10 LAB — APTT: aPTT: 29 s (ref 24–36)

## 2024-04-10 LAB — TROPONIN I (HIGH SENSITIVITY)
Troponin I (High Sensitivity): 14580 ng/L (ref <18)
Troponin I (High Sensitivity): 39 ng/L — ABNORMAL HIGH

## 2024-04-10 MED ORDER — TIROFIBAN HCL IN NACL 5-0.9 MG/100ML-% IV SOLN
INTRAVENOUS | Status: AC
  Filled 2024-04-10: qty 100

## 2024-04-10 MED ORDER — SODIUM CHLORIDE 0.9% FLUSH
3.0000 mL | Freq: Two times a day (BID) | INTRAVENOUS | Status: DC
  Administered 2024-04-10 – 2024-04-11 (×4): 3 mL via INTRAVENOUS

## 2024-04-10 MED ORDER — SODIUM CHLORIDE 0.9% FLUSH: 3.0000 mL | INTRAVENOUS | Status: DC | PRN

## 2024-04-10 MED ORDER — MORPHINE SULFATE (PF) 2 MG/ML IV SOLN
1.0000 mg | Freq: Once | INTRAVENOUS | Status: AC
  Administered 2024-04-10: 1 mg via INTRAVENOUS
  Filled 2024-04-10: qty 1

## 2024-04-10 MED ORDER — FAMOTIDINE IN NACL 20-0.9 MG/50ML-% IV SOLN
INTRAVENOUS | Status: AC
Start: 2024-04-10 — End: 2024-04-10
  Filled 2024-04-10: qty 50

## 2024-04-10 MED ORDER — CHLORHEXIDINE GLUCONATE CLOTH 2 % EX PADS
6.0000 | MEDICATED_PAD | Freq: Every day | CUTANEOUS | Status: DC
  Administered 2024-04-10 – 2024-04-11 (×2): 6 via TOPICAL

## 2024-04-10 MED ORDER — HYDRALAZINE HCL 20 MG/ML IJ SOLN
INTRAMUSCULAR | Status: AC
  Filled 2024-04-10: qty 1

## 2024-04-10 MED ORDER — MIRTAZAPINE 15 MG PO TABS
15.0000 mg | ORAL_TABLET | Freq: Every day | ORAL | Status: DC
  Administered 2024-04-10 – 2024-04-11 (×2): 15 mg via ORAL
  Filled 2024-04-10 (×2): qty 1

## 2024-04-10 MED ORDER — INSULIN ASPART 100 UNIT/ML IJ SOLN
0.0000 [IU] | Freq: Three times a day (TID) | INTRAMUSCULAR | Status: DC
  Administered 2024-04-10: 2 [IU] via SUBCUTANEOUS
  Administered 2024-04-10: 1 [IU] via SUBCUTANEOUS
  Administered 2024-04-10: 2 [IU] via SUBCUTANEOUS
  Administered 2024-04-11 (×2): 1 [IU] via SUBCUTANEOUS
  Administered 2024-04-12: 2 [IU] via SUBCUTANEOUS

## 2024-04-10 MED ORDER — SODIUM CHLORIDE 0.9 % IV SOLN: INTRAVENOUS | Status: AC

## 2024-04-10 MED ORDER — ATORVASTATIN CALCIUM 80 MG PO TABS
80.0000 mg | ORAL_TABLET | Freq: Every day | ORAL | Status: DC
  Administered 2024-04-10 – 2024-04-12 (×3): 80 mg via ORAL
  Filled 2024-04-10 (×3): qty 1

## 2024-04-10 MED ORDER — FAMOTIDINE IN NACL 20-0.9 MG/50ML-% IV SOLN
INTRAVENOUS | Status: AC | PRN
  Administered 2024-04-10: 20 mg via INTRAVENOUS

## 2024-04-10 MED ORDER — ENOXAPARIN SODIUM 40 MG/0.4ML IJ SOSY
40.0000 mg | PREFILLED_SYRINGE | Freq: Every day | INTRAMUSCULAR | Status: DC
  Administered 2024-04-10 – 2024-04-12 (×3): 40 mg via SUBCUTANEOUS
  Filled 2024-04-10 (×3): qty 0.4

## 2024-04-10 MED ORDER — TIROFIBAN (AGGRASTAT) BOLUS VIA INFUSION
INTRAVENOUS | Status: DC | PRN
  Administered 2024-04-10: 1550 ug via INTRAVENOUS

## 2024-04-10 MED ORDER — HYDRALAZINE HCL 20 MG/ML IJ SOLN: 10.0000 mg | INTRAMUSCULAR | Status: AC | PRN

## 2024-04-10 MED ORDER — SODIUM CHLORIDE 0.9 % IV SOLN: 250.0000 mL | INTRAVENOUS | Status: AC | PRN

## 2024-04-10 MED ORDER — LABETALOL HCL 5 MG/ML IV SOLN
10.0000 mg | INTRAVENOUS | Status: AC | PRN
  Filled 2024-04-10: qty 4

## 2024-04-10 MED ORDER — ACETAMINOPHEN 325 MG PO TABS
650.0000 mg | ORAL_TABLET | ORAL | Status: DC | PRN
  Administered 2024-04-10 – 2024-04-11 (×4): 650 mg via ORAL
  Filled 2024-04-10 (×4): qty 2

## 2024-04-10 MED ORDER — ONDANSETRON HCL 4 MG/2ML IJ SOLN: 4.0000 mg | Freq: Four times a day (QID) | INTRAMUSCULAR | Status: DC | PRN

## 2024-04-10 MED ORDER — ASPIRIN 81 MG PO TBEC
81.0000 mg | DELAYED_RELEASE_TABLET | Freq: Every day | ORAL | Status: DC
  Administered 2024-04-10 – 2024-04-12 (×3): 81 mg via ORAL
  Filled 2024-04-10 (×3): qty 1

## 2024-04-10 MED ORDER — FENTANYL CITRATE PF 50 MCG/ML IJ SOSY: 12.5000 ug | PREFILLED_SYRINGE | INTRAMUSCULAR | Status: DC | PRN

## 2024-04-10 MED ORDER — CLOPIDOGREL BISULFATE 300 MG PO TABS
ORAL_TABLET | ORAL | Status: DC | PRN
  Administered 2024-04-10: 600 mg via ORAL

## 2024-04-10 MED ORDER — TIROFIBAN HCL IN NACL 5-0.9 MG/100ML-% IV SOLN
INTRAVENOUS | Status: AC | PRN
  Administered 2024-04-10: .15 ug/kg/min via INTRAVENOUS

## 2024-04-10 MED ORDER — IOHEXOL 350 MG/ML SOLN
INTRAVENOUS | Status: DC | PRN
  Administered 2024-04-10: 75 mL via INTRA_ARTERIAL

## 2024-04-10 MED ORDER — NITROGLYCERIN 0.4 MG SL SUBL
0.4000 mg | SUBLINGUAL_TABLET | SUBLINGUAL | Status: DC | PRN
  Administered 2024-04-10: 0.4 mg via SUBLINGUAL
  Filled 2024-04-10: qty 1

## 2024-04-10 MED ORDER — CLOPIDOGREL BISULFATE 300 MG PO TABS
ORAL_TABLET | ORAL | Status: AC
  Filled 2024-04-10: qty 2

## 2024-04-10 MED ORDER — PANTOPRAZOLE SODIUM 40 MG PO TBEC
40.0000 mg | DELAYED_RELEASE_TABLET | Freq: Every day | ORAL | Status: DC
  Administered 2024-04-10 – 2024-04-12 (×3): 40 mg via ORAL
  Filled 2024-04-10 (×3): qty 1

## 2024-04-10 MED ORDER — HEPARIN SODIUM (PORCINE) 1000 UNIT/ML IJ SOLN
INTRAMUSCULAR | Status: DC | PRN
  Administered 2024-04-10: 4000 [IU] via INTRAVENOUS

## 2024-04-10 MED ORDER — LOSARTAN POTASSIUM 50 MG PO TABS
50.0000 mg | ORAL_TABLET | Freq: Two times a day (BID) | ORAL | Status: DC
  Administered 2024-04-10 – 2024-04-12 (×6): 50 mg via ORAL
  Filled 2024-04-10 (×6): qty 1

## 2024-04-10 MED ORDER — FENTANYL CITRATE PF 50 MCG/ML IJ SOSY
12.5000 ug | PREFILLED_SYRINGE | Freq: Once | INTRAMUSCULAR | Status: AC
  Administered 2024-04-10: 12.5 ug via INTRAVENOUS
  Filled 2024-04-10: qty 1

## 2024-04-10 MED ORDER — CLOPIDOGREL BISULFATE 75 MG PO TABS
75.0000 mg | ORAL_TABLET | Freq: Every day | ORAL | Status: DC
  Administered 2024-04-10 – 2024-04-12 (×3): 75 mg via ORAL
  Filled 2024-04-10 (×3): qty 1

## 2024-04-10 MED ORDER — ACETAMINOPHEN 325 MG PO TABS: 650.0000 mg | ORAL_TABLET | ORAL | Status: DC | PRN

## 2024-04-10 MED FILL — Verapamil HCl IV Soln 2.5 MG/ML: INTRAVENOUS | Qty: 2 | Status: AC

## 2024-04-10 NOTE — Progress Notes (Signed)
 Rounding Note    Patient Name: Mary  A Gina Higgins Date of Encounter: 04/10/2024  [facility] Cardiologist: None   Subjective   Chest pain last night. No chest pain this am. No dyspnea  Inpatient Medications    Scheduled Meds:  aspirin EC  81 mg Oral Daily   atorvastatin   80 mg Oral Daily   clopidogrel  75 mg Oral Q breakfast   enoxaparin (LOVENOX) injection  40 mg Subcutaneous Daily   insulin  aspart  0-9 Units Subcutaneous TID WC   losartan   50 mg Oral BID   mirtazapine   15 mg Oral QHS   pantoprazole   40 mg Oral Daily   sodium chloride  flush  3 mL Intravenous Q12H   Continuous Infusions:  sodium chloride      sodium chloride      PRN Meds: sodium chloride , acetaminophen , hydrALAZINE , labetalol , nitroGLYCERIN, ondansetron  (ZOFRAN ) IV, sodium chloride  flush   Vital Signs    Vitals:   04/10/24 0515 04/10/24 0530 04/10/24 0545 04/10/24 0600  BP: (!) 149/76 91/76 134/73 130/76  Pulse: 78 86 73   Resp: 20 15 12 17   Temp:      TempSrc:      SpO2: 98% (!) 68% (!) 70%   Weight:      Height:        Intake/Output Summary (Last 24 hours) at 04/10/2024 0705 Last data filed at 04/10/2024 0600 Gross per 24 hour  Intake --  Output 400 ml  Net -400 ml      04/10/2024    1:15 AM 04/10/2024   12:00 AM 02/15/2024    3:31 PM  Last 3 Weights  Weight (lbs) 129 lb 6.6 oz 136 lb 11 oz 131 lb 12.8 oz  Weight (kg) 58.7 kg 62 kg 59.784 kg      Telemetry    Sinus, paced - Personally Reviewed  ECG    Sinus, inferior infarct - Personally Reviewed  Physical Exam   GEN: No acute distress.   Neck: No JVD Cardiac: RRR, no murmurs, rubs, or gallops.  Respiratory: Clear to auscultation bilaterally. GI: Soft, nontender, non-distended  MS: No edema; No deformity. Neuro:  Nonfocal  Psych: Normal affect   Labs    High Sensitivity Troponin:   Recent Labs  Lab 04/09/24 2336  TROPONINIHS 39*     Chemistry Recent Labs  Lab 04/09/24 2336 04/10/24 0019  NA 135  129*  K 3.5 3.9  CL 107 96*  CO2 20*  --   GLUCOSE 127* 130*  BUN 12 14  CREATININE 0.54 0.60  CALCIUM  7.5*  --   PROT 5.4*  --   ALBUMIN 3.4*  --   AST 13*  --   ALT 9  --   ALKPHOS 51  --   BILITOT 0.7  --   GFRNONAA >60  --   ANIONGAP 8  --     Lipids  Recent Labs  Lab 04/09/24 2336  CHOL 137  TRIG 68  HDL 42  LDLCALC 81  CHOLHDL 3.3    Hematology Recent Labs  Lab 04/09/24 2336 04/10/24 0019  WBC 9.3  --   RBC 3.85*  --   HGB 11.6* 11.9*  HCT 33.6* 35.0*  MCV 87.3  --   MCH 30.1  --   MCHC 34.5  --   RDW 13.5  --   PLT 211  --    Thyroid  No results for input(s): "TSH", "FREET4" in the last 168 hours.  BNPNo results for input(s): "  BNP", "PROBNP" in the last 168 hours.  DDimer No results for input(s): "DDIMER" in the last 168 hours.   Radiology    DG Chest Port 1 View Result Date: 04/10/2024 CLINICAL DATA:  Chest pain. EXAM: PORTABLE CHEST 1 VIEW COMPARISON:  Portable chest 09/21/2022 FINDINGS: The lungs are mildly emphysematous but clear. No pleural effusion is seen. The cardiomediastinal silhouette and vasculature are normal apart from calcifications in the transverse aorta. There is a right chest dual lead pacing system with stable wire insertions. There are bilateral axillary surgical clips. Osteopenia with no new osseous findings. Compare: Stable COPD chest. IMPRESSION: No evidence of acute chest disease. Emphysema. Aortic atherosclerosis. Electronically Signed   By: Denman Fischer M.D.   On: 04/10/2024 06:33   CARDIAC CATHETERIZATION Result Date: 04/10/2024   Prox Cx to Mid Cx lesion is 50% stenosed.   2nd Mrg lesion is 50% stenosed.   Prox LAD to Dist LAD lesion is 30% stenosed.   Prox RCA lesion is 40% stenosed.   Mid RCA to Dist RCA lesion is 30% stenosed.   RPDA lesion is 100% stenosed. Acute inferior STEMI secondary to thrombotic occlusion of the small to moderate caliber PDA Unsuccessful attempt to cross the PDA (unable to cross due to distal location  of lesion. The RCA is tortuous and calcified) Moderate non-obstructive disease in the LAD and Circumflex LVEDP=25 mmHg Recommendations: Will admit to the ICU. The PDA is closed and filling from left to right collaterals. Will not continue IV heparin . Will continue ASA and Plavix. Continue high intensity statin. Echo tomorrow.    Cardiac Studies     Patient Profile     83 y.o. female with history of DM, HTN, HLD, SSS s/p pacemaker, tobacco abuse, COPD admitted with an inferior STEMI. Cardiac cath with occluded small to moderate caliber PDA that could not be opened. (The RCA is tortuous and calcified. Unable to cross the PDA lesion or deliver a balloon to the lesion).   Assessment & Plan    CAD/Inferior STEMI: Unable to open the occluded PDA earlier this am. Planning medical management of CAD. Continue ASA, Plavix, statin. Echo today to assess LVEF. Will leave in the ICU today since she is still having chest pain. Morphine /Fentanyl  for chest pain today.  HTN: BP controlled.  DM: SSI while admitted.  4.   Sick sinus syndrome: Pacemaker in place.   Anticipate remaining in ICU today and d/c home tomorrow if she is stable.   For questions or updates, please contact [facility] Please consult [email or web address] for contact info under        Signed, Antoinette Batman, MD  04/10/2024, 7:05 AM

## 2024-04-10 NOTE — Progress Notes (Signed)
 Pt c/o CP at 0240 7/10, given PRN tylenol  and NTG SL, no change MD contacted given 1x order of Morphine  1 mg, no change Discovered R fem hematoma wt bleed, manual pressure held for 20 min, MD contacted, came to bedside. Site stable after hold, hematoma border marked. MD ordered 12.5 mcg of Fentanyl , pt pain 7/10 to 2/10  Pt resting comfortably at this time.

## 2024-04-10 NOTE — Progress Notes (Signed)
 Echocardiogram 2D Echocardiogram has been performed.  Mary Higgins 04/10/2024, 11:00 AM

## 2024-04-10 NOTE — Telephone Encounter (Signed)
 Pharmacy Patient Advocate Encounter  Insurance verification completed.    The patient is insured through [employer]. Patient has Medicare and is not eligible for a copay card, but may be able to apply for patient assistance or Medicare RX Payment Plan (Patient Must reach out to their plan, if eligible for payment plan), if available.    Ran test claim for JARDIANCE 10MG  TABLETS and the current 30 day co-pay is $249.03. Copay applied to the $250.00 deductible. $47.00 copay after the deductible.  Ran test claim for FARXIGA 10MG  TABLETS and the current 30 day co-pay is $364.93. Copay applied to the $250.00 deductible. $162.90 copay after the deductible.  Ran test claim for DAPAGLIFLOZIN 10MG  TABLETS and the drug is not covered by the insurance. Brand name Farxiga is preferred.     This test claim was processed through [facility]- copay amounts may vary at other pharmacies due to pharmacy/plan contracts, or as the patient moves through the different stages of their insurance plan.

## 2024-04-11 DIAGNOSIS — I10 Essential (primary) hypertension: Secondary | ICD-10-CM

## 2024-04-11 DIAGNOSIS — I2111 ST elevation (STEMI) myocardial infarction involving right coronary artery: Secondary | ICD-10-CM | POA: Diagnosis not present

## 2024-04-11 LAB — GLUCOSE, CAPILLARY
Glucose-Capillary: 149 mg/dL — ABNORMAL HIGH (ref 70–99)
Glucose-Capillary: 149 mg/dL — ABNORMAL HIGH (ref 70–99)
Glucose-Capillary: 117 mg/dL — ABNORMAL HIGH (ref 70–99)
Glucose-Capillary: 136 mg/dL — ABNORMAL HIGH (ref 70–99)

## 2024-04-11 LAB — CBC
HCT: 32 % — ABNORMAL LOW (ref 36.0–46.0)
Hemoglobin: 11.3 g/dL — ABNORMAL LOW (ref 12.0–15.0)
MCH: 30 pg (ref 26.0–34.0)
MCHC: 35.3 g/dL (ref 30.0–36.0)
MCV: 84.9 fL (ref 80.0–100.0)
Platelets: 205 10*3/uL (ref 150–400)
RBC: 3.77 MIL/uL — ABNORMAL LOW (ref 3.87–5.11)
RDW: 13.5 % (ref 11.5–15.5)
WBC: 8.8 10*3/uL (ref 4.0–10.5)
nRBC: 0 % (ref 0.0–0.2)

## 2024-04-11 LAB — LIPOPROTEIN A (LPA): Lipoprotein (a): 14.4 nmol/L (ref <75.0)

## 2024-04-11 MED ORDER — METOPROLOL SUCCINATE ER 25 MG PO TB24
25.0000 mg | ORAL_TABLET | Freq: Every day | ORAL | Status: DC
  Administered 2024-04-11: 25 mg via ORAL
  Filled 2024-04-11: qty 1

## 2024-04-11 MED FILL — Hydralazine HCl Inj 20 MG/ML: INTRAMUSCULAR | Qty: 1 | Status: AC

## 2024-04-11 NOTE — Evaluation (Signed)
 Physical Therapy Evaluation Patient Details Name: Mary Higgins MRN: [id number] DOB: 12-07-1940 Today's Date: 04/11/2024  History of Present Illness  83 y.o. female admitted 5/28 with inferior STEMI.   Cardiac cath with occluded small to moderate caliber PDA that could not be opened. PMH: DM, HTN, HLD, SSS s/p pacemaker, tobacco abuse, COPD  Clinical Impression  Pt admitted with above diagnosis. Pt with soft BPs this pm therefore only able to get pt OOB to chair and do some sitting and standing exercises.  Nurse aware. Pt tolerated well overall and has good family support. Should progress to home with HHPT. Pt currently with functional limitations due to the deficits listed below (see PT Problem List). Pt will benefit from acute skilled PT to increase their independence and safety with mobility to allow discharge.           If plan is discharge home, recommend the following: A little help with walking and/or transfers;A little help with bathing/dressing/bathroom;Assistance with cooking/housework;Help with stairs or ramp for entrance;Assist for transportation   Can travel by private vehicle        Equipment Recommendations BSC/3in1  Recommendations for Other Services       Functional Status Assessment Patient has had a recent decline in their functional status and demonstrates the ability to make significant improvements in function in a reasonable and predictable amount of time.     Precautions / Restrictions Precautions Precautions: Fall Restrictions Weight Bearing Restrictions Per Provider Order: No      Mobility  Bed Mobility Overal bed mobility: Needs Assistance Bed Mobility: Supine to Sit     Supine to sit: Min assist     General bed mobility comments: Assist for trunk and LEs.  Scooting assist with pad to get pts feet on floor    Transfers Overall transfer level: Needs assistance Equipment used: Rolling walker (2 wheels) Transfers: Sit to/from Stand Sit to  Stand: Min assist, From elevated surface           General transfer comment: Assist to power up. cues for hand placement as well    Ambulation/Gait Ambulation/Gait assistance: Min assist Gait Distance (Feet): 3 Feet Assistive device: Rolling walker (2 wheels) Gait Pattern/deviations: Step-through pattern, Decreased stride length, Trunk flexed   Gait velocity interpretation: <1.31 ft/sec, indicative of household ambulator   General Gait Details: Pt took a few steps and reported some dizziness.  BPs soft therefore determined to do a few standing exercises and transfer to chair only today.  Stairs            Wheelchair Mobility     Tilt Bed    Modified Rankin (Stroke Patients Only)       Balance Overall balance assessment: Needs assistance Sitting-balance support: No upper extremity supported, Feet supported Sitting balance-Leahy Scale: Fair     Standing balance support: Bilateral upper extremity supported, During functional activity Standing balance-Leahy Scale: Poor Standing balance comment: relies on UE support and min assist by therapist                             Pertinent Vitals/Pain Pain Assessment Pain Assessment: Faces Faces Pain Scale: Hurts little more Pain Location: HA Pain Descriptors / Indicators: Aching Pain Intervention(s): Limited activity within patient's tolerance, Monitored during session, Repositioned    Home Living Family/patient expects to be discharged to:: Private residence Living Arrangements: Children (son and daughter) Available Help at Discharge: Family;Available 24 hours/day Type of Home: House  Home Access: Stairs to enter Entrance Stairs-Rails: Right Entrance Stairs-Number of Steps: 5   Home Layout: Two level;Laundry or work area in basement;Able to live on main level with bedroom/bathroom Home Equipment: Pharmacist, hospital (2 wheels);Cane - single point      Prior Function Prior Level of Function :  Needs assist             Mobility Comments: Pt states that son or daughter assist her with ambulation and she uses cane or RW, sometimes she can walk more than other times ADLs Comments: Daughter assists her with B/D     Extremity/Trunk Assessment   Upper Extremity Assessment Upper Extremity Assessment: Defer to OT evaluation    Lower Extremity Assessment Lower Extremity Assessment: Generalized weakness    Cervical / Trunk Assessment Cervical / Trunk Assessment: Kyphotic  Communication   Communication Communication: No apparent difficulties    Cognition Arousal: Alert Behavior During Therapy: Flat affect   PT - Cognitive impairments: Problem solving, Safety/Judgement                         Following commands: Intact       Cueing Cueing Techniques: Verbal cues, Tactile cues     General Comments General comments (skin integrity, edema, etc.): 71 bpm, 99% RA, 100/54 on arrival.  sitting 92 bpm 103/61; standing 93bpm, 122/97    Exercises General Exercises - Lower Extremity Ankle Circles/Pumps: AROM, Both, 10 reps, Supine Long Arc Quad: AROM, Both, 5 reps, Seated Hip Flexion/Marching: AROM, Both, 10 reps, Standing   Assessment/Plan    PT Assessment Patient needs continued PT services  PT Problem List Decreased activity tolerance;Decreased balance;Decreased mobility;Decreased knowledge of use of DME;Decreased safety awareness;Decreased knowledge of precautions;Cardiopulmonary status limiting activity       PT Treatment Interventions DME instruction;Gait training;Functional mobility training;Therapeutic activities;Stair training;Therapeutic exercise;Balance training;Patient/family education    PT Goals (Current goals can be found in the Care Plan section)  Acute Rehab PT Goals Patient Stated Goal: to go home PT Goal Formulation: With patient Time For Goal Achievement: 04/25/24 Potential to Achieve Goals: Good    Frequency Min 2X/week      Co-evaluation               AM-PAC PT "6 Clicks" Mobility  Outcome Measure Help needed turning from your back to your side while in a flat bed without using bedrails?: None Help needed moving from lying on your back to sitting on the side of a flat bed without using bedrails?: A Little Help needed moving to and from a bed to a chair (including a wheelchair)?: A Little Help needed standing up from a chair using your arms (e.g., wheelchair or bedside chair)?: A Little Help needed to walk in hospital room?: Total Help needed climbing 3-5 steps with a railing? : Total 6 Click Score: 15    End of Session Equipment Utilized During Treatment: Gait belt Activity Tolerance: Patient limited by fatigue Patient left: in chair;with call bell/phone within reach;with chair alarm set Nurse Communication: Mobility status PT Visit Diagnosis: Muscle weakness (generalized) (M62.81)    Time: 3244-0102 PT Time Calculation (min) (ACUTE ONLY): 23 min   Charges:   PT Evaluation $PT Eval Moderate Complexity: 1 Mod PT Treatments $Therapeutic Activity: 8-22 mins PT General Charges $$ ACUTE PT VISIT: 1 Visit         Saron Vanorman M,PT Acute Rehab Services [phone]   Florencia Hunter 04/11/2024, 3:24 PM

## 2024-04-11 NOTE — TOC Initial Note (Signed)
 Transition of Care [facility]) - Initial/Assessment Note    Patient Details  Name: Mary Higgins MRN: [id number] Date of Birth: 04/16/1941  [facility] CM/SW Contact:    Cosimo Diones, RN Phone Number: 04/11/2024, 2:16 PM  Clinical Narrative: Patient presented for chest pain-Stemi. PTA patient was from home with son and daughter. Patient reports that daughter takes her to appointments. Patient has DME cane and rolling walker. Awaiting PT/OT consult for recommendations. Patient is on Plavix and  benefits check has been completed for [employer]- patient has a deductible. Case Manager will continue to follow for transition of care needs as she progresses.                  Expected Discharge Plan: Home w Home Health Services Barriers to Discharge: Continued Medical Work up  Expected Discharge Plan and Services   Discharge Planning Services: CM Consult   Living arrangements for the past 2 months: Single Family Home  Prior Living Arrangements/Services Living arrangements for the past 2 months: Single Family Home Lives with:: Adult Children Patient language and need for interpreter reviewed:: Yes Do you feel safe going back to the place where you live?: Yes      Need for Family Participation in Patient Care: Yes (Comment) Care giver support system in place?: Yes (comment) Current home services: DME (cane and rolling walker) Criminal Activity/Legal Involvement Pertinent to Current Situation/Hospitalization: No - Comment as needed   Permission Sought/Granted Permission sought to share information with : Family Supports, Case Manager   Emotional Assessment Appearance:: Appears stated age Attitude/Demeanor/Rapport: Unable to Assess Affect (typically observed): Unable to Assess Orientation: : Oriented to Self Alcohol  / Substance Use: Not Applicable Psych Involvement: No (comment)  Admission diagnosis:  STEMI (ST elevation myocardial infarction) (HCC)  [I21.3] Acute ST elevation myocardial infarction (STEMI) of inferior wall (HCC) [I21.19] Patient Active Problem List   Diagnosis Date Noted   Acute ST elevation myocardial infarction (STEMI) of inferior wall (HCC) 04/10/2024   STEMI (ST elevation myocardial infarction) (HCC) 04/09/2024   Syncope 09/21/2022   S/P coil embolization of cerebral aneurysm 09/30/2021   Subdural hemorrhage (HCC) 08/04/2021   Meningioma (HCC)    Subdural hematoma (HCC) 08/03/2021   Midline shift of brain 08/03/2021   Pacemaker 08/03/2021   Solitary pulmonary nodule 08/03/2021   Headache 07/27/2021   Hyponatremia 02/07/2017   Diarrhea 12/13/2016   Actinic keratosis 12/13/2016   Fall at home, initial encounter 12/13/2016   Cervicalgia 08/18/2015   Onychomycosis 01/20/2015   Hyperlipidemia 01/20/2015   Syncope and collapse 09/10/2014   Insomnia 03/18/2014   Depression due to head injury 03/18/2014   Flank pain 03/18/2014   Blepharospasm 03/18/2014   GERD (gastroesophageal reflux disease) 03/18/2014   Primary hypertension 03/18/2014   Type 2 diabetes mellitus with hyperglycemia, without long-term current use of insulin  (HCC) 03/18/2014   Breast mass 03/18/2014   Abnormality of gait 03/18/2014   History of fall 03/18/2014   Other malaise and fatigue 03/18/2014   Major depressive disorder with single episode 03/18/2014   Sinus node dysfunction (HCC) 12/21/2013   Tobacco abuse 12/21/2013   Edema 12/21/2013   Neurocardiogenic syncope    Subarachnoid hemorrhage (HCC) 06/01/2002   History of cerebral aneurysm 06/01/2002   S/P VP shunt 2003   PCP:  Verma Gobble, NP Pharmacy:   [facility] [phone] - [place], [place] - [address]  [place] [address] Phone: [phone] Fax: [phone]  [facility] [address] [place] [address] Phone: [phone] Fax: [phone]     Social  Drivers of Health (SDOH) Social History: SDOH Screenings   Food Insecurity: No Food Insecurity (04/11/2024)  Housing: Low Risk  (04/11/2024)  Transportation Needs: No Transportation Needs (04/11/2024)  Utilities: Not At Risk (04/11/2024)  Alcohol  Screen: Low Risk  (10/01/2018)  Depression (PHQ2-9): Low Risk  (03/21/2024)  Financial Resource Strain: Low Risk  (05/13/2018)  Physical Activity: Inactive (05/13/2018)  Social Connections: Moderately Isolated (04/11/2024)  Stress: No Stress Concern Present (05/13/2018)  Tobacco Use: High Risk (03/21/2024)   SDOH Interventions: Food Insecurity Interventions: Intervention Not Indicated Housing Interventions: Intervention Not Indicated Transportation Interventions: Intervention Not Indicated Utilities Interventions: Intervention Not Indicated Social Connections Interventions: Intervention Not Indicated   Readmission Risk Interventions     No data to display

## 2024-04-11 NOTE — Progress Notes (Addendum)
 Rounding Note    Patient Name: Mary Higgins  Mary Higgins Date of Encounter: 04/11/2024  [facility] Cardiologist: Abel Hoe (new-former [facility] pt)  Subjective   No chest pain or dyspnea  Inpatient Medications    Scheduled Meds:  aspirin EC  81 mg Oral Daily   atorvastatin   80 mg Oral Daily   Chlorhexidine  Gluconate Cloth  6 each Topical Daily   clopidogrel  75 mg Oral Q breakfast   enoxaparin (LOVENOX) injection  40 mg Subcutaneous Daily   insulin  aspart  0-9 Units Subcutaneous TID WC   losartan   50 mg Oral BID   mirtazapine   15 mg Oral QHS   pantoprazole   40 mg Oral Daily   sodium chloride  flush  3 mL Intravenous Q12H   Continuous Infusions:   PRN Meds: acetaminophen , fentaNYL  (SUBLIMAZE ) injection, nitroGLYCERIN, ondansetron  (ZOFRAN ) IV, sodium chloride  flush   Vital Signs    Vitals:   04/11/24 0411 04/11/24 0500 04/11/24 0600 04/11/24 0700  BP: 99/65 (!) 128/92 112/67   Pulse: 81 77 72   Resp: (!) 21 (!) 21 20   Temp:    98.4 F (36.9 C)  TempSrc:    Axillary  SpO2: 94% 97% 94%   Weight:      Height:        Intake/Output Summary (Last 24 hours) at 04/11/2024 0750 Last data filed at 04/11/2024 0600 Gross per 24 hour  Intake 759.01 ml  Output 1220 ml  Net -460.99 ml      04/10/2024    1:15 AM 04/10/2024   12:00 AM 02/15/2024    3:31 PM  Last 3 Weights  Weight (lbs) 129 lb 6.6 oz 136 lb 11 oz 131 lb 12.8 oz  Weight (kg) 58.7 kg 62 kg 59.784 kg      Telemetry    Atrial paced - Personally Reviewed  ECG    Atrial paced, inferior Q waves, poor R wave progression - Personally Reviewed  Physical Exam   GEN: No acute distress.   Neck: No JVD Cardiac: RRR, no murmurs, rubs, or gallops.  Respiratory: Clear to auscultation bilaterally. GI: Soft, nontender, non-distended  MS: No edema; No deformity. Neuro:  Nonfocal  Psych: Normal affect   Labs    High Sensitivity Troponin:   Recent Labs  Lab 04/09/24 2336 04/10/24 0706  TROPONINIHS 39*  14,580*     Chemistry Recent Labs  Lab 04/09/24 2336 04/10/24 0019 04/10/24 0706  NA 135 129* 129*  K 3.5 3.9 3.9  CL 107 96* 101  CO2 20*  --  22  GLUCOSE 127* 130* 167*  BUN 12 14 13   CREATININE 0.54 0.60 0.66  CALCIUM  7.5*  --  9.0  PROT 5.4*  --   --   ALBUMIN 3.4*  --   --   AST 13*  --   --   ALT 9  --   --   ALKPHOS 51  --   --   BILITOT 0.7  --   --   GFRNONAA >60  --  >60  ANIONGAP 8  --  6    Lipids  Recent Labs  Lab 04/09/24 2336  CHOL 137  TRIG 68  HDL 42  LDLCALC 81  CHOLHDL 3.3    Hematology Recent Labs  Lab 04/09/24 2336 04/10/24 0019 04/10/24 0706 04/11/24 0455  WBC 9.3  --  10.8* 8.8  RBC 3.85*  --  3.93 3.77*  HGB 11.6* 11.9* 11.7* 11.3*  HCT 33.6* 35.0* 34.0* 32.0*  MCV 87.3  --  86.5 84.9  MCH 30.1  --  29.8 30.0  MCHC 34.5  --  34.4 35.3  RDW 13.5  --  13.4 13.5  PLT 211  --  255 205   Thyroid  No results for input(s): "TSH", "FREET4" in the last 168 hours.  BNPNo results for input(s): "BNP", "PROBNP" in the last 168 hours.  DDimer No results for input(s): "DDIMER" in the last 168 hours.   Radiology    ECHOCARDIOGRAM COMPLETE Result Date: 04/10/2024    ECHOCARDIOGRAM REPORT   Patient Name:   Mary  Mary Higgins Date of Exam: 04/10/2024 Medical Rec #:  [id number]          Height:       71.0 in Accession #:    [id number]         Weight:       129.4 lb Date of Birth:  1941/02/04          BSA:          1.752 m Patient Age:    83 years           BP:           130/76 mmHg Patient Gender: F                  HR:           72 bpm. Exam Location:  Inpatient Procedure: 2D Echo, Cardiac Doppler and Color Doppler (Both Spectral and Color            Flow Doppler were utilized during procedure). Indications:    Chest Pain R07.9  History:        Patient has prior history of Echocardiogram examinations, most                 recent 09/21/2022. Acute MI, Pacemaker, Arrythmias:LBBB,                 Signs/Symptoms:Syncope; Risk Factors:Hypertension, Diabetes,                  Dyslipidemia and Current Smoker.  Sonographer:    Terrilee Few RCS Referring Phys: [phone] Fayne Mcguffee D [facility]  Sonographer Comments: Image acquisition challenging due to patient body habitus. IMPRESSIONS  1. Left ventricular ejection fraction, by estimation, is 50 to 55%. The left ventricle has low normal function. The left ventricle demonstrates regional wall motion abnormalities (see scoring diagram/findings for description). There is mild left ventricular hypertrophy of the basal-septal segment. Left ventricular diastolic parameters are consistent with Grade I diastolic dysfunction (impaired relaxation). Elevated left ventricular end-diastolic pressure. There is akinesis of the left ventricular, entire inferolateral wall and inferior wall. There is akinesis of the left ventricular, apical inferior wall.  2. Right ventricular systolic function is normal. The right ventricular size is normal. There is normal pulmonary artery systolic pressure. The estimated right ventricular systolic pressure is 33.0 mmHg.  3. The mitral valve is degenerative. Mild mitral valve regurgitation. No evidence of mitral stenosis. Moderate mitral annular calcification.  4. The aortic valve is tricuspid. Aortic valve regurgitation is not visualized. Aortic valve sclerosis/calcification is present, without any evidence of aortic stenosis. Aortic valve Vmax measures 1.16 m/s.  5. The inferior vena cava is dilated in size with >50% respiratory variability, suggesting right atrial pressure of 8 mmHg. FINDINGS  Left Ventricle: Left ventricular ejection fraction, by estimation, is 50 to 55%. The left ventricle has low normal function. The left ventricle demonstrates regional wall motion abnormalities.  The left ventricular internal cavity size was normal in size. There is mild left ventricular hypertrophy of the basal-septal segment. Left ventricular diastolic parameters are consistent with Grade I diastolic dysfunction (impaired  relaxation). Elevated left ventricular end-diastolic pressure. Right Ventricle: The right ventricular size is normal. No increase in right ventricular wall thickness. Right ventricular systolic function is normal. There is normal pulmonary artery systolic pressure. The tricuspid regurgitant velocity is 2.50 m/s, and  with an assumed right atrial pressure of 8 mmHg, the estimated right ventricular systolic pressure is 33.0 mmHg. Left Atrium: Left atrial size was normal in size. Right Atrium: Right atrial size was normal in size. Pericardium: There is no evidence of pericardial effusion. Mitral Valve: The mitral valve is degenerative in appearance. There is mild calcification of the mitral valve leaflet(s). Moderate mitral annular calcification. Mild mitral valve regurgitation. No evidence of mitral valve stenosis. Tricuspid Valve: The tricuspid valve is normal in structure. Tricuspid valve regurgitation is mild . No evidence of tricuspid stenosis. Aortic Valve: The aortic valve is tricuspid. Aortic valve regurgitation is not visualized. Aortic valve sclerosis/calcification is present, without any evidence of aortic stenosis. Aortic valve peak gradient measures 5.4 mmHg. Pulmonic Valve: The pulmonic valve was normal in structure. Pulmonic valve regurgitation is not visualized. No evidence of pulmonic stenosis. Aorta: The aortic root is normal in size and structure. Venous: The inferior vena cava is dilated in size with greater than 50% respiratory variability, suggesting right atrial pressure of 8 mmHg. IAS/Shunts: No atrial level shunt detected by color flow Doppler. Additional Comments: Mary device lead is visualized.  LEFT VENTRICLE PLAX 2D LVIDd:         3.70 cm   Diastology LVIDs:         2.70 cm   LV e' medial:    3.70 cm/s LV PW:         0.90 cm   LV E/e' medial:  20.2 LV IVS:        1.20 cm   LV e' lateral:   7.51 cm/s LVOT diam:     2.20 cm   LV E/e' lateral: 9.9 LV SV:         68 LV SV Index:   39 LVOT Area:      3.80 cm  RIGHT VENTRICLE             IVC RV S prime:     19.60 cm/s  IVC diam: 2.40 cm TAPSE (M-mode): 2.2 cm LEFT ATRIUM           Index        RIGHT ATRIUM           Index LA diam:      3.80 cm 2.17 cm/m   RA Area:     14.80 cm LA Vol (A2C): 35.6 ml 20.31 ml/m  RA Volume:   36.10 ml  20.60 ml/m LA Vol (A4C): 35.2 ml 20.09 ml/m  AORTIC VALVE AV Area (Vmax): 2.90 cm AV Vmax:        116.00 cm/s AV Peak Grad:   5.4 mmHg LVOT Vmax:      88.50 cm/s LVOT Vmean:     55.900 cm/s LVOT VTI:       0.180 m  AORTA Ao Root diam: 3.50 cm Ao Asc diam:  2.80 cm MITRAL VALVE               TRICUSPID VALVE MV Area (PHT): 3.37 cm    TR Peak grad:  25.0 mmHg MV Decel Time: 225 msec    TR Vmax:        250.00 cm/s MV E velocity: 74.60 cm/s MV Mary velocity: 94.00 cm/s  SHUNTS MV E/Mary ratio:  0.79        Systemic VTI:  0.18 m                            Systemic Diam: 2.20 cm Gaylyn Keas MD Electronically signed by Gaylyn Keas MD Signature Date/Time: 04/10/2024/1:05:50 PM    Final    DG Chest Port 1 View Result Date: 04/10/2024 CLINICAL DATA:  Chest pain. EXAM: PORTABLE CHEST 1 VIEW COMPARISON:  Portable chest 09/21/2022 FINDINGS: The lungs are mildly emphysematous but clear. No pleural effusion is seen. The cardiomediastinal silhouette and vasculature are normal apart from calcifications in the transverse aorta. There is Mary right chest dual lead pacing system with stable wire insertions. There are bilateral axillary surgical clips. Osteopenia with no new osseous findings. Compare: Stable COPD chest. IMPRESSION: No evidence of acute chest disease. Emphysema. Aortic atherosclerosis. Electronically Signed   By: Denman Fischer M.D.   On: 04/10/2024 06:33   CARDIAC CATHETERIZATION Result Date: 04/10/2024   Prox Cx to Mid Cx lesion is 50% stenosed.   2nd Mrg lesion is 50% stenosed.   Prox LAD to Dist LAD lesion is 30% stenosed.   Prox RCA lesion is 40% stenosed.   Mid RCA to Dist RCA lesion is 30% stenosed.   RPDA lesion is 100%  stenosed. Acute inferior STEMI secondary to thrombotic occlusion of the small to moderate caliber PDA Unsuccessful attempt to cross the PDA (unable to cross due to distal location of lesion. The RCA is tortuous and calcified) Moderate non-obstructive disease in the LAD and Circumflex LVEDP=25 mmHg Recommendations: Will admit to the ICU. The PDA is closed and filling from left to right collaterals. Will not continue IV heparin . Will continue ASA and Plavix. Continue high intensity statin. Echo tomorrow.    Cardiac Studies     Patient Profile     83 y.o. female with history of DM, HTN, HLD, SSS s/p pacemaker, tobacco abuse, COPD admitted with an inferior STEMI. Cardiac cath with occluded small to moderate caliber PDA that could not be opened. (The RCA is tortuous and calcified. Unable to cross the PDA lesion or deliver Mary balloon to the lesion). Echo 04/10/24 with preserved LV function overall with inferior wall akinesis.   Assessment & Plan    CAD/Inferior STEMI: Unable to open the occluded PDA on 04/10/24. No chest pain. Echo with inferior wall akinesis. Plan is for medical management of CAD. Will continue ASA and plavix for one year. Continue high intensity statin. Will add Toprol  25 mg today. If she tolerates, can consider addition of low dose ARB tomorrow.  HTN: BP is normal DM: SSI while admitted.  4.   Sick sinus syndrome: Pacemaker in place.   Anticipate d/c home on 04/12/24.   For questions or updates, please contact [facility] Please consult [email or web address] for contact info under      Signed, Antoinette Batman, MD  04/11/2024, 7:50 AM

## 2024-04-11 NOTE — Progress Notes (Signed)
 CARDIAC REHAB PHASE I     Pt resting in bed, feeling well today. Pt has had limited mobility post cath. Would benefit from PT/OT evaluation. Post MI education including restrictions, risk factors, exercise guidelines, antiplatelet therapy importance, MI booklet, NTG use, heart healthy diabetic diet, smoking cessation and CRP2 reviewed. All questions and concerns addressed. Will refer to [facility] for CRP2. Will continue to follow.   [id number] Ronny Colas, RN BSN 04/11/2024 11:54 AM

## 2024-04-12 ENCOUNTER — Telehealth: Payer: Self-pay | Admitting: Student

## 2024-04-12 ENCOUNTER — Other Ambulatory Visit (HOSPITAL_COMMUNITY): Payer: Self-pay

## 2024-04-12 DIAGNOSIS — I2111 ST elevation (STEMI) myocardial infarction involving right coronary artery: Secondary | ICD-10-CM | POA: Diagnosis not present

## 2024-04-12 LAB — BASIC METABOLIC PANEL WITH GFR
Anion gap: 11 (ref 5–15)
BUN: 11 mg/dL (ref 8–23)
CO2: 22 mmol/L (ref 22–32)
Calcium: 8.9 mg/dL (ref 8.9–10.3)
Chloride: 95 mmol/L — ABNORMAL LOW (ref 98–111)
Creatinine, Ser: 0.63 mg/dL (ref 0.44–1.00)
GFR, Estimated: >60 mL/min (ref >60)
Glucose, Bld: 132 mg/dL — ABNORMAL HIGH (ref 70–99)
Potassium: 3.6 mmol/L (ref 3.5–5.1)
Sodium: 128 mmol/L — ABNORMAL LOW (ref 135–145)

## 2024-04-12 LAB — CBC
HCT: 34.1 % — ABNORMAL LOW (ref 36.0–46.0)
Hemoglobin: 11.8 g/dL — ABNORMAL LOW (ref 12.0–15.0)
MCH: 29.4 pg (ref 26.0–34.0)
MCHC: 34.6 g/dL (ref 30.0–36.0)
MCV: 84.8 fL (ref 80.0–100.0)
Platelets: 242 10*3/uL (ref 150–400)
RBC: 4.02 MIL/uL (ref 3.87–5.11)
RDW: 13.4 % (ref 11.5–15.5)
WBC: 10.5 10*3/uL (ref 4.0–10.5)
nRBC: 0 % (ref 0.0–0.2)

## 2024-04-12 LAB — GLUCOSE, CAPILLARY: Glucose-Capillary: 165 mg/dL — ABNORMAL HIGH (ref 70–99)

## 2024-04-12 MED ORDER — LOSARTAN POTASSIUM 50 MG PO TABS: 50.0000 mg | ORAL_TABLET | Freq: Every day | ORAL | Status: DC

## 2024-04-12 MED ORDER — CLOPIDOGREL BISULFATE 75 MG PO TABS
75.0000 mg | ORAL_TABLET | Freq: Every day | ORAL | 2 refills | Status: AC
Start: 2024-04-13 — End: ?
  Filled 2024-04-12: qty 90, 90d supply, fill #0

## 2024-04-12 MED ORDER — LOSARTAN POTASSIUM 50 MG PO TABS
50.0000 mg | ORAL_TABLET | Freq: Every day | ORAL | 2 refills | Status: AC
  Filled 2024-04-12: qty 90, 90d supply, fill #0

## 2024-04-12 MED ORDER — METOPROLOL SUCCINATE ER 25 MG PO TB24
25.0000 mg | ORAL_TABLET | Freq: Every day | ORAL | 2 refills | Status: AC
  Filled 2024-04-12: qty 90, 90d supply, fill #0

## 2024-04-12 MED ORDER — NITROGLYCERIN 0.4 MG SL SUBL
0.4000 mg | SUBLINGUAL_TABLET | SUBLINGUAL | 1 refills | Status: AC | PRN
Start: 2024-04-12 — End: ?
  Filled 2024-04-12: qty 25, 1d supply, fill #0

## 2024-04-12 MED ORDER — ASPIRIN 81 MG PO TBEC
81.0000 mg | DELAYED_RELEASE_TABLET | Freq: Every day | ORAL | 2 refills | Status: AC
  Filled 2024-04-12: qty 90, 90d supply, fill #0

## 2024-04-12 MED ORDER — METOPROLOL SUCCINATE ER 25 MG PO TB24: 25.0000 mg | ORAL_TABLET | Freq: Every day | ORAL | Status: DC

## 2024-04-12 MED ORDER — ATORVASTATIN CALCIUM 80 MG PO TABS
80.0000 mg | ORAL_TABLET | Freq: Every day | ORAL | 2 refills | Status: AC
  Filled 2024-04-12: qty 90, 90d supply, fill #0

## 2024-04-12 NOTE — Progress Notes (Signed)
   Rounding Note    Patient Name: Mary  Era Higgins Date of Encounter: 04/12/2024  [facility] Cardiologist: Antoinette Batman, MD   Subjective   NAEO. Feels "OK" this AM.  Vital Signs    Vitals:   04/11/24 1954 04/11/24 2310 04/12/24 0316 04/12/24 0738  BP:  97/76 (!) 154/72 118/64  Pulse:  91 80 79  Resp:  20 16 18   Temp:  97.8 F (36.6 C) (!) 97.5 F (36.4 C)   TempSrc:  Oral Oral   SpO2: 100% 99% 91% 97%  Weight:      Height:        Intake/Output Summary (Last 24 hours) at 04/12/2024 0932 Last data filed at 04/12/2024 0739 Gross per 24 hour  Intake 480 ml  Output 200 ml  Net 280 ml      04/10/2024    1:15 AM 04/10/2024   12:00 AM 02/15/2024    3:31 PM  Last 3 Weights  Weight (lbs) 129 lb 6.6 oz 136 lb 11 oz 131 lb 12.8 oz  Weight (kg) 58.7 kg 62 kg 59.784 kg      Telemetry    Personally Reviewed  Physical Exam    GEN: No acute distress.  Elderly. Cardiac: RRR, no murmurs, rubs, or gallops. RFA access site without hematoma. Some bruising noted. Respiratory: Clear to auscultation bilaterally. Psych: Normal affect   Assessment & Plan    #CAD Severe. S/p Inferior STEMI. Unsuccessful PCI to PDA on 04/10/2024. Plan to medically manage. Cont statin Cont bb Cont aspirin  Cont plavix  Cont losartan   #HTN  #SSS #PPM in situ  OK to discharge today.   Donelda Fujita T. Marven Slimmer, MD, [facility], [facility] Cardiac Electrophysiology

## 2024-04-12 NOTE — Discharge Summary (Signed)
 Discharge Summary   Patient ID: Mary  ANNE Higgins MRN: [id number]; DOB: 12-Oct-1941  Admit date: 04/09/2024 Discharge date: 04/12/2024  PCP:  Verma Gobble, NP   [facility] Providers Cardiologist:  Antoinette Batman, MD   { EP: Previously Dr. Nunzio Belch --> Establishing with Dr. Arlester Ladd in 04/2024  Discharge Diagnoses  Principal Problem:   STEMI (ST elevation myocardial infarction) [facility]) Active Problems:   SSS (sick sinus syndrome) (HCC)   Essential hypertension   Hyperlipidemia   Acute ST elevation myocardial infarction (STEMI) of inferior wall [facility])   Diagnostic Studies/Procedures   Cardiac Catheterization: 04/10/2024      Prox Cx to Mid Cx lesion is 50% stenosed.   2nd Mrg lesion is 50% stenosed.   Prox LAD to Dist LAD lesion is 30% stenosed.   Prox RCA lesion is 40% stenosed.   Mid RCA to Dist RCA lesion is 30% stenosed.   RPDA lesion is 100% stenosed.   Acute inferior STEMI secondary to thrombotic occlusion of the small to moderate caliber PDA Unsuccessful attempt to cross the PDA (unable to cross due to distal location of lesion. The RCA is tortuous and calcified) Moderate non-obstructive disease in the LAD and Circumflex LVEDP=25 mmHg   Recommendations: Will admit to the ICU. The PDA is closed and filling from left to right collaterals. Will not continue IV heparin . Will continue ASA and Plavix . Continue high intensity statin. Echo tomorrow.     Echocardiogram: 04/10/2024 IMPRESSIONS     1. Left ventricular ejection fraction, by estimation, is 50 to 55%. The  left ventricle has low normal function. The left ventricle demonstrates  regional wall motion abnormalities (see scoring diagram/findings for  description). There is mild left  ventricular hypertrophy of the basal-septal segment. Left ventricular  diastolic parameters are consistent with Grade I diastolic dysfunction  (impaired relaxation). Elevated left ventricular end-diastolic  pressure.  There is akinesis of the left  ventricular, entire inferolateral wall and inferior wall. There is  akinesis of the left ventricular, apical inferior wall.   2. Right ventricular systolic function is normal. The right ventricular  size is normal. There is normal pulmonary artery systolic pressure. The  estimated right ventricular systolic pressure is 33.0 mmHg.   3. The mitral valve is degenerative. Mild mitral valve regurgitation. No  evidence of mitral stenosis. Moderate mitral annular calcification.   4. The aortic valve is tricuspid. Aortic valve regurgitation is not  visualized. Aortic valve sclerosis/calcification is present, without any  evidence of aortic stenosis. Aortic valve Vmax measures 1.16 m/s.   5. The inferior vena cava is dilated in size with >50% respiratory  variability, suggesting right atrial pressure of 8 mmHg.     History of Present Illness    Mary  A Higgins is a 83 y.o. female with past medical history of HTN, HLD, Type 2 DM, SSS (s/p PPM placement), COPD and tobacco use who presented to [facility] on 04/09/2024 as an Inferior STEMI.  She reported developing chest pain earlier in the evening which had radiated to her left arm with associated nausea and vomiting. Initial EKG showed ST elevation along the inferior leads and she underwent emergent cardiac catheterization.  Hospital Course    Consultants: None   Cardiac catheterization showed thrombotic occlusion of a small to moderate caliber PDA and attempts were unsuccessful to cross the PDA due to the distal location of the lesion and the RCA was tortuous and calcified. She had moderate nonobstructive disease along the LAD and LCx. The PDA  was filling from left-to-right collaterals and medical management was recommended. Was started on ASA and Plavix  with recommendations to continue for 1 year. Also started on high-intensity statin therapy with Atorvastatin  80 mg daily.  She was still having chest  pain the following morning which was treated with Morphine  and Fentanyl . Echocardiogram showed her EF was low-normal at 50 to 55% and there was akinesis of the left ventricular, entire inferior lateral wall and inferior wall along with akinesis of the apical inferior wall. RV function was normal and she only had mild MR and aortic valve sclerosis without stenosis. Notes mentioned to consider the addition of ARB but she had actually been receiving Losartan  50 mg twice daily this admission and had hypotension on 04/11/2024. Dosing was reduced to 50 mg daily and she was restarted on Toprol -XL 25mg  daily. She was examined by Dr. Marven Slimmer on 04/12/2024 and denied any recurrent pain. Was deemed stable for discharge. Medications sent to [facility] pharmacy and follow-up has been arranged. She will need a repeat BMET at her follow-up appt given hyponatremia (Na+ 128 at the time of discharge).       Did the patient have an acute coronary syndrome (MI, NSTEMI, STEMI, etc) this admission?:  Yes                               AHA/ACC ACS Clinical Performance & Quality Measures: Aspirin  prescribed? - Yes ADP Receptor Inhibitor (Plavix /Clopidogrel , Brilinta/Ticagrelor or Effient/Prasugrel) prescribed (includes medically managed patients)? - Yes Beta Blocker prescribed? - Yes High Intensity Statin (Lipitor 40-80mg  or Crestor 20-40mg ) prescribed? - Yes EF assessed during THIS hospitalization? - Yes For EF <40%, was ACEI/ARB prescribed? - Not Applicable (EF >/= 40%) For EF <40%, Aldosterone Antagonist (Spironolactone or Eplerenone) prescribed? - Not Applicable (EF >/= 40%) Cardiac Rehab Phase II ordered (including medically managed patients)? - Yes       The patient will be scheduled for a TOC follow up appointment.  A message has been sent to the [facility] and Scheduling Pool at the office where the patient should be seen for follow up.  _____________  Discharge Vitals Blood pressure 118/64, pulse 79, temperature (!) 97.5  F (36.4 C), temperature source Oral, resp. rate 18, height 5\' 11"  (1.803 m), weight 58.7 kg, SpO2 97%.  Filed Weights   04/10/24 0115  Weight: 58.7 kg    Labs & Radiologic Studies  CBC Recent Labs    04/09/24 2336 04/10/24 0019 04/11/24 0455 04/12/24 0513  WBC 9.3   < > 8.8 10.5  NEUTROABS 7.6  --   --   --   HGB 11.6*   < > 11.3* 11.8*  HCT 33.6*   < > 32.0* 34.1*  MCV 87.3   < > 84.9 84.8  PLT 211   < > 205 242   < > = values in this interval not displayed.   Basic Metabolic Panel Recent Labs    16/10/96 0706 04/12/24 0513  NA 129* 128*  K 3.9 3.6  CL 101 95*  CO2 22 22  GLUCOSE 167* 132*  BUN 13 11  CREATININE 0.66 0.63  CALCIUM  9.0 8.9   Liver Function Tests Recent Labs    04/09/24 2336  AST 13*  ALT 9  ALKPHOS 51  BILITOT 0.7  PROT 5.4*  ALBUMIN 3.4*   No results for input(s): "LIPASE", "AMYLASE" in the last 72 hours. High Sensitivity Troponin:   Recent Labs  Lab 04/09/24 2336 04/10/24 0706  TROPONINIHS 39* 14,580*    No results for input(s): "TRNPT" in the last 720 hours.  BNP Invalid input(s): "POCBNP" No results for input(s): "PROBNP" in the last 72 hours.  No results for input(s): "BNP" in the last 72 hours.  D-Dimer No results for input(s): "DDIMER" in the last 72 hours. Hemoglobin A1C Recent Labs    04/09/24 2336  HGBA1C 5.4   Fasting Lipid Panel Recent Labs    04/09/24 2336  CHOL 137  HDL 42  LDLCALC 81  TRIG 68  CHOLHDL 3.3   Lipoprotein (a)  Date/Time Value Ref Range Status  04/10/2024 07:06 AM 14.4 <75.0 nmol/L Final    Comment:    (NOTE) Note:  Values greater than or equal to 75.0 nmol/L may       indicate an independent risk factor for CHD,       but must be evaluated with caution when applied       to non-Caucasian populations due to the       influence of genetic factors on Lp(a) across       ethnicities. Performed At: [facility] [address] [place], [place] [id number] Pearlean Botts MD  BJ:[phone]     Thyroid  Function Tests No results for input(s): "TSH", "T4TOTAL", "T3FREE", "THYROIDAB" in the last 72 hours.  Invalid input(s): "FREET3"   DG Chest Port 1 View Result Date: 04/10/2024 CLINICAL DATA:  Chest pain. EXAM: PORTABLE CHEST 1 VIEW COMPARISON:  Portable chest 09/21/2022 FINDINGS: The lungs are mildly emphysematous but clear. No pleural effusion is seen. The cardiomediastinal silhouette and vasculature are normal apart from calcifications in the transverse aorta. There is a right chest dual lead pacing system with stable wire insertions. There are bilateral axillary surgical clips. Osteopenia with no new osseous findings. Compare: Stable COPD chest. IMPRESSION: No evidence of acute chest disease. Emphysema. Aortic atherosclerosis. Electronically Signed   By: Denman Fischer M.D.   On: 04/10/2024 06:33   Disposition Pt is being discharged home today in good condition.  Follow-up Plans & Appointments  Follow-up Information     Carie Charity, NP Follow up on 04/28/2024.   Specialty: Cardiology Why: Cardiology Follow-up on 04/28/2024 at 10:55 AM. Contact information: [address] [place] [address] [phone]                Discharge Instructions     Amb Referral to Cardiac Rehabilitation   Complete by: As directed    Diagnosis: STEMI   After initial evaluation and assessments completed: Virtual Based Care may be provided alone or in conjunction with Phase 2 Cardiac Rehab based on patient barriers.: Yes   Intensive Cardiac Rehabilitation (ICR) [facility] location only OR Traditional Cardiac Rehabilitation (TCR) *If criteria for ICR are not met will enroll in TCR [facility] only): Yes   Diet - low sodium heart healthy   Complete by: As directed    Discharge instructions   Complete by: As directed    Radial Site Care Refer to this sheet in the next few weeks. These instructions provide you with information on caring for yourself after your procedure.  Your caregiver may also give you more specific instructions. Your treatment has been planned according to current medical practices, but problems sometimes occur. Call your caregiver if you have any problems or questions after your procedure. HOME CARE INSTRUCTIONS You may shower the day after the procedure. Remove the bandage (dressing) and gently wash the site with plain soap and  water. Gently pat the site dry.  Do not apply powder or lotion to the site.  Do not submerge the affected site in water for 3 to 5 days.  Inspect the site at least twice daily.  Do not flex or bend the affected arm for 24 hours.  No lifting over 5 pounds (2.3 kg) for 5 days after your procedure.  Do not drive home if you are discharged the same day of the procedure. Have someone else drive you.  You may drive 24 hours after the procedure unless otherwise instructed by your caregiver.  What to expect: Any bruising will usually fade within 1 to 2 weeks.  Blood that collects in the tissue (hematoma) may be painful to the touch. It should usually decrease in size and tenderness within 1 to 2 weeks.  SEEK IMMEDIATE MEDICAL CARE IF: You have unusual pain at the radial site.  You have redness, warmth, swelling, or pain at the radial site.  You have drainage (other than a small amount of blood on the dressing).  You have chills.  You have a fever or persistent symptoms for more than 72 hours.  You have a fever and your symptoms suddenly get worse.  Your arm becomes pale, cool, tingly, or numb.  You have heavy bleeding from the site. Hold pressure on the site.   Increase activity slowly   Complete by: As directed    No dressing needed   Complete by: As directed        Discharge Medications Allergies as of 04/12/2024       Reactions   Daypro [oxaprozin] Rash   Pt does not recognize this medication        Medication List     STOP taking these medications    ibuprofen 200 MG tablet Commonly known as:  ADVIL       TAKE these medications    aspirin  EC 81 MG tablet Take 1 tablet (81 mg total) by mouth daily. Swallow whole. Start taking on: April 13, 2024   atorvastatin  80 MG tablet Commonly known as: LIPITOR Take 1 tablet (80 mg total) by mouth daily. Start taking on: April 13, 2024   clonazePAM  1 MG tablet Commonly known as: KLONOPIN  Take 1 tablet (1 mg total) by mouth 3 (three) times daily.   clopidogrel  75 MG tablet Commonly known as: PLAVIX  Take 1 tablet (75 mg total) by mouth daily with breakfast. Start taking on: April 13, 2024   gabapentin  100 MG capsule Commonly known as: Neurontin  Take 1 capsule (100 mg total) by mouth at bedtime. What changed:  when to take this reasons to take this   losartan  50 MG tablet Commonly known as: COZAAR  Take 1 tablet (50 mg total) by mouth daily. What changed:  medication strength See the new instructions.   metoprolol  succinate 25 MG 24 hr tablet Commonly known as: TOPROL -XL Take 1 tablet (25 mg total) by mouth daily. Start taking on: April 13, 2024   mirtazapine  15 MG tablet Commonly known as: REMERON  TAKE 1 TABLET(15 MG) BY MOUTH AT BEDTIME   Multivitamin Women 50+ Tabs Take 1 tablet by mouth daily.   nitroGLYCERIN  0.4 MG SL tablet Commonly known as: NITROSTAT  Place 1 tablet (0.4 mg total) under the tongue every 5 (five) minutes x 3 doses as needed for chest pain.   pantoprazole  40 MG tablet Commonly known as: PROTONIX  TAKE 1 TABLET(40 MG) BY MOUTH DAILY What changed: See the new instructions.  Discharge Care Instructions  (From admission, onward)           Start     Ordered   04/12/24 0000  No dressing needed        04/12/24 1046             Outstanding Labs/Studies  BMET at follow-up. FLP and LFT's in 6-8 weeks.   Duration of Discharge Encounter: APP Time: 25 minutes   Signed, Dorma Gash, PA-C 04/12/2024, 10:49 AM

## 2024-04-12 NOTE — Evaluation (Addendum)
 Occupational Therapy Evaluation Patient Details Name: Mary Higgins  ANGELLYNN KIMBERLIN MRN: [id number] DOB: Jul 06, 1941 Today's Date: 04/12/2024   History of Present Illness   83 y.o. female admitted 5/28 with inferior STEMI.   Cardiac cath with occluded small to moderate caliber PDA that could not be opened. PMH: DM, HTN, HLD, SSS s/p pacemaker, tobacco abuse, COPD     Clinical Impressions Pt questionable historian, reports using RW/cane for mobility and has assist with bathing/dressing. Pt states daughter is home at all times. Pt currently needing up to mod A for ADLs, CGA for bed mobility and up to min A for transfers with RW. Pt with x2 LOB episodes and needing x1 seated rest break when walking short distance in room from bed > door. Pt will need hands on physical assist for ambulation at this time, as well as assist for ADLs. Provided gait belt for family to use. Pt presenting with impairments listed below, will follow acutely. Recommend HHOT at d/c given family can provide level assist needed at home, if unable, will need postacute rehab at d/c.      If plan is discharge home, recommend the following:   A little help with walking and/or transfers;A lot of help with bathing/dressing/bathroom;Direct supervision/assist for medications management;Direct supervision/assist for financial management;Assist for transportation;Help with stairs or ramp for entrance     Functional Status Assessment   Patient has had a recent decline in their functional status and demonstrates the ability to make significant improvements in function in a reasonable and predictable amount of time.     Equipment Recommendations   BSC/3in1     Recommendations for Other Services   PT consult     Precautions/Restrictions   Precautions Precautions: Fall Restrictions Weight Bearing Restrictions Per Provider Order: No     Mobility Bed Mobility Overal bed mobility: Needs Assistance Bed Mobility: Supine to Sit      Supine to sit: Contact guard          Transfers Overall transfer level: Needs assistance Equipment used: Rolling walker (2 wheels) Transfers: Sit to/from Stand Sit to Stand: Min assist, From elevated surface                  Balance Overall balance assessment: Needs assistance Sitting-balance support: No upper extremity supported, Feet supported Sitting balance-Leahy Scale: Fair     Standing balance support: Bilateral upper extremity supported, During functional activity Standing balance-Leahy Scale: Poor Standing balance comment: LOBx 2 heavy, reliance on external support                           ADL either performed or assessed with clinical judgement   ADL Overall ADL's : Needs assistance/impaired Eating/Feeding: Set up;Sitting   Grooming: Minimal assistance;Sitting   Upper Body Bathing: Minimal assistance   Lower Body Bathing: Moderate assistance   Upper Body Dressing : Minimal assistance   Lower Body Dressing: Moderate assistance   Toilet Transfer: Minimal assistance;Ambulation;Rolling walker (2 wheels);Regular Toilet   Toileting- Clothing Manipulation and Hygiene: Minimal assistance       Functional mobility during ADLs: Minimal assistance;Rolling walker (2 wheels)       Vision Baseline Vision/History: 1 Wears glasses Vision Assessment?: No apparent visual deficits     Perception Perception: Not tested       Praxis Praxis: Not tested       Pertinent Vitals/Pain Pain Assessment Pain Assessment: No/denies pain     Extremity/Trunk Assessment Upper Extremity Assessment Upper Extremity Assessment:  Generalized weakness   Lower Extremity Assessment Lower Extremity Assessment: Defer to PT evaluation   Cervical / Trunk Assessment Cervical / Trunk Assessment: Kyphotic   Communication Communication Communication: No apparent difficulties   Cognition Arousal: Alert Behavior During Therapy: Flat affect Cognition: No  family/caregiver present to determine baseline             OT - Cognition Comments: pt confused, following commands, but at times needs repetition of instructions                 Following commands: Intact       Cueing  General Comments   Cueing Techniques: Verbal cues;Tactile cues      Exercises     Shoulder Instructions      Home Living Family/patient expects to be discharged to:: Private residence Living Arrangements: Children Available Help at Discharge: Family;Available 24 hours/day Type of Home: House Home Access: Stairs to enter [employer] of Steps: 5 Entrance Stairs-Rails: Right Home Layout: Two level;Laundry or work area in basement;Able to live on main level with bedroom/bathroom     Bathroom Shower/Tub: Chief Strategy Officer: Standard     Home Equipment: Pharmacist, hospital (2 wheels);Cane - single point          Prior Functioning/Environment Prior Level of Function : Needs assist             Mobility Comments: Pt states that son or daughter assist her with ambulation and she uses cane or RW, sometimes she can walk more than other times ADLs Comments: Daughter assists her with B/D    OT Problem List: Decreased strength;Decreased range of motion;Decreased activity tolerance;Impaired balance (sitting and/or standing);Decreased cognition;Decreased safety awareness;Cardiopulmonary status limiting activity   OT Treatment/Interventions: Self-care/ADL training;Therapeutic exercise;Energy conservation;DME and/or AE instruction;Therapeutic activities;Patient/family education;Balance training      OT Goals(Current goals can be found in the care plan section)   Acute Rehab OT Goals Patient Stated Goal: none stated OT Goal Formulation: With patient Time For Goal Achievement: 04/26/24 Potential to Achieve Goals: Good ADL Goals Pt Will Perform Upper Body Dressing: with modified independence;sitting Pt Will  Perform Lower Body Dressing: with modified independence;sitting/lateral leans;sit to/from stand Pt Will Transfer to Toilet: with modified independence;ambulating;regular height toilet Pt Will Perform Tub/Shower Transfer: Tub transfer;Shower transfer;with modified independence;ambulating   OT Frequency:  Min 2X/week    Co-evaluation              AM-PAC OT "6 Clicks" Daily Activity     Outcome Measure Help from another person eating meals?: A Little Help from another person taking care of personal grooming?: A Little Help from another person toileting, which includes using toliet, bedpan, or urinal?: A Lot Help from another person bathing (including washing, rinsing, drying)?: A Lot Help from another person to put on and taking off regular upper body clothing?: A Little Help from another person to put on and taking off regular lower body clothing?: A Lot 6 Click Score: 15   End of Session Equipment Utilized During Treatment: Gait belt;Rolling walker (2 wheels) Nurse Communication: Mobility status  Activity Tolerance: Patient tolerated treatment well Patient left: in bed;with call bell/phone within reach;with bed alarm set  OT Visit Diagnosis: Unsteadiness on feet (R26.81);Other abnormalities of gait and mobility (R26.89);Muscle weakness (generalized) (M62.81)                Time: 1610-9604 OT Time Calculation (min): 15 min Charges:  OT General Charges $OT Visit: 1 Visit OT Evaluation $  OT Eval Moderate Complexity: 1 Mod  Luv Mish K, OTD, OTR/L SecureChat Preferred Acute Rehab [phone]   Antionette Kirks 04/12/2024, 11:48 AM

## 2024-04-12 NOTE — Progress Notes (Signed)
 Daughter - Jullie Oiler called and states she is on the way to pick up patient for discharge. Patient's IV removed and home clothing placed on patient. Awaiting daughter

## 2024-04-12 NOTE — Telephone Encounter (Signed)
   Transition of Care Follow-up Phone Call Request    Patient Name: Mary  A Gina Higgins Date of Birth: 1940-12-06 Date of Encounter: 04/12/2024  Primary Care Provider:  Verma Gobble, NP Primary Cardiologist:  Antoinette Batman, MD  [name] has been scheduled for a transition of care follow up appointment with a [facility] provider:  Lawana Pray, NP on 04/28/2024  Please reach out to [name] within 48 hours of discharge to confirm appointment and review transition of care protocol questionnaire. Anticipated discharge date: 04/12/2024.  Dorma Gash, PA-C  04/12/2024, 10:40 AM

## 2024-04-14 ENCOUNTER — Other Ambulatory Visit: Payer: Self-pay

## 2024-04-14 ENCOUNTER — Telehealth: Payer: Self-pay

## 2024-04-14 MED ORDER — MIRTAZAPINE 15 MG PO TABS: 15.0000 mg | ORAL_TABLET | Freq: Every day | ORAL | 1 refills | Status: AC

## 2024-04-14 NOTE — Telephone Encounter (Signed)
 This encounter was created in error - please disregard.

## 2024-04-14 NOTE — Telephone Encounter (Signed)
 No answer on home phone#. Let message for patient to call office .  Ask to speak to any triage nurse- for [facility] question?

## 2024-04-14 NOTE — Telephone Encounter (Signed)
 Called the cell #  [phone]- unable to leave message  the mailbox is full

## 2024-04-14 NOTE — Transitions of Care (Post Inpatient/ED Visit) (Signed)
   04/14/2024  Name: Mary Higgins  ADALAY AZUCENA MRN: [id number] DOB: Aug 17, 1941  Today's TOC FU Call Status: Today's TOC FU Call Status:: Unsuccessful Call (1st Attempt) Unsuccessful Call (1st Attempt) Date: 04/14/24  Attempted to reach the patient regarding the most recent Inpatient/ED visit.  Follow Up Plan: Additional outreach attempts will be made to reach the patient to complete the Transitions of Care (Post Inpatient/ED visit) call.   Tonia Frankel RN, CCM [place]  VBCI-Population Health RN Care Manager [phone]

## 2024-04-14 NOTE — Telephone Encounter (Signed)
 Patient has request refill on pending medication. Medication pend and sent to Ulyses Gandy, NP due to PCP Roselie Conger Champ Coma, NP being out of office.

## 2024-04-15 ENCOUNTER — Telehealth: Payer: Self-pay

## 2024-04-15 NOTE — Transitions of Care (Post Inpatient/ED Visit) (Signed)
   04/15/2024  Name: JOHNIECE HORNBAKER MRN: [id number] DOB: 11/14/1940  Today's TOC FU Call Status: Today's TOC FU Call Status:: Unsuccessful Call (2nd Attempt) Unsuccessful Call (2nd Attempt) Date: 04/15/24  Attempted to reach the patient regarding the most recent Inpatient/ED visit.  Follow Up Plan: Additional outreach attempts will be made to reach the patient to complete the Transitions of Care (Post Inpatient/ED visit) call.   Tonia Frankel RN, CCM [place]  [facility]BC[facility]Population Health RN Care Manager [phone]

## 2024-04-15 NOTE — Telephone Encounter (Signed)
 Patient identification verified by 2 forms. Sims Duck, RN     Called patient. No answer. LVMTCB

## 2024-04-16 ENCOUNTER — Telehealth: Payer: Self-pay

## 2024-04-16 NOTE — Transitions of Care (Post Inpatient/ED Visit) (Signed)
   04/16/2024  Name: Mary  LYNNLEE Higgins MRN: [id number] DOB: Oct 16, 1941  Today's TOC FU Call Status: Today's TOC FU Call Status:: Unsuccessful Call (3rd Attempt) Unsuccessful Call (3rd Attempt) Date: 04/16/24  Attempted to reach the patient regarding the most recent Inpatient/ED visit.  Follow Up Plan: No further outreach attempts will be made at this time. We have been unable to contact the patient.  Tonia Frankel RN, CCM   VBCI-Population Health RN Care Manager [phone]

## 2024-04-16 NOTE — Telephone Encounter (Signed)
 Transition Care Management Unsuccessful Follow-up Telephone Call  Date of discharge and from where:  04/12/24 from [facility]  Attempts:  3rd Attempt  Reason for unsuccessful TCM follow-up call:  Left voice message

## 2024-04-22 ENCOUNTER — Telehealth: Payer: Self-pay

## 2024-04-22 NOTE — Telephone Encounter (Signed)
 Received a fax from [facility] pharmacy to fill Remeron . Rx has already been filled at her preferred pharmacy of [facility]. Called patient to see if she wants the pharmacy changed to [facility] for the next refill. Left a message on patient's voicemail for her to return our call and clarify.

## 2024-04-23 ENCOUNTER — Telehealth: Payer: Self-pay

## 2024-04-23 NOTE — Telephone Encounter (Signed)
 Message left by michelle at [facility] 90 day Suppply for Gabapentin  needed.   Patient last seen 05/2023, Patient hasn't follow up. Due Five months after visit.    LMOVM for patient to call and schedule.

## 2024-04-24 ENCOUNTER — Ambulatory Visit: Attending: Cardiovascular Disease | Admitting: Cardiovascular Disease

## 2024-04-24 NOTE — Progress Notes (Deleted)
 Cardiology Clinic Note   Patient Name: Mary Higgins  Era Hasty Date of Encounter: 04/24/2024  Primary Care Provider:  Verma Gobble, NP Primary Cardiologist:  Antoinette Batman, MD  Patient Profile    Mary Higgins 83 year old female presents to the clinic today for evaluation of her coronary artery disease status post STEMI.  Past Medical History    Past Medical History:  Diagnosis Date   Anxiety and depression    Hx of   Blepharospasm     Breast cancer (HCC) 1987   s/p lumpectomy.   Carotid bruit    Bilateral. Mild to moderate disease on LEFT and mild on the RIGHT in 2012   COPD (chronic obstructive pulmonary disease) (HCC)    mild. Not on inhalers.   Diabetes mellitus without complication (HCC)    AODM   Fuchs' corneal dystrophy    GERD (gastroesophageal reflux disease)     Headache    Hyperlipidemia    Hypertension     Insomnia     Intracerebral bleed (HCC) 2003   Remote intracerebral blled and coiling by Dr. Alvira Josephs for cerebral aneurysm and a shunt placed by Dr. Nigel Bart remotely. No seizure disorder and this happened in 2003 with an anterior communicating aneurysm.   Meningioma (HCC)    left parafalcine calcified meningioma   Neurocardiogenic syncope    a. s/p Biotronik dual chamber pacemaker   Pacemaker    Personal history of radiation therapy    S/P VP shunt 2003   Sinus node dysfunction (HCC)     Tobacco abuse     Type II or unspecified type diabetes mellitus without mention of complication, not stated as uncontrolled     Past Surgical History:  Procedure Laterality Date   ANEURYSM COILING  11/13/2001   Cerebral aneurysm ;Dr. Luellen Sages   BREAST LUMPECTOMY Right    For cancer, no recurrence   CEREBRAL ANEURYSM REPAIR  2023   CHOLECYSTECTOMY     CORONARY/GRAFT ACUTE MI REVASCULARIZATION N/A 04/09/2024   Procedure: Coronary/Graft Acute MI Revascularization;  Surgeon: Odie Benne, MD;  Location: [facility] INVASIVE CV LAB;  Service:  Cardiovascular;  Laterality: N/A;   CRANIOTOMY Right 08/04/2021   Procedure: BURR HOLES FOR SUBDURAL HEMATOMA;  Surgeon: Cannon Champion, MD;  Location: [facility] OR;  Service: Neurosurgery;  Laterality: Right;   IR ANGIO INTRA EXTRACRAN SEL INTERNAL CAROTID UNI R MOD SED  09/30/2021   IR ANGIO VERTEBRAL SEL VERTEBRAL UNI R MOD SED  09/30/2021   IR TRANSCATH/EMBOLIZ  09/30/2021   LEFT HEART CATH AND CORONARY ANGIOGRAPHY N/A 04/09/2024   Procedure: LEFT HEART CATH AND CORONARY ANGIOGRAPHY;  Surgeon: Odie Benne, MD;  Location: [facility] INVASIVE CV LAB;  Service: Cardiovascular;  Laterality: N/A;   PACEMAKER GENERATOR CHANGE N/A 09/25/2014   BTK dual chamber pacemkaer implanted by Dr Nunzio Belch   PACEMAKER INSERTION  07/12/2006   St. Jude Victory XL DR  -  Dr. Silvestre Drum   PARTIAL HYSTERECTOMY     RADIOLOGY WITH ANESTHESIA N/A 09/30/2021   Procedure: Arteriogram, Onyx embolization of right middle meningeal artery;  Surgeon: Augusto Blonder, MD;  Location: [facility] OR;  Service: Radiology;  Laterality: N/A;   VENTRICULOPERITONEAL SHUNT  11/13/2001   Dr. Manya Sells    Allergies  Allergies  Allergen Reactions   Daypro [Oxaprozin] Rash    Pt does not recognize this medication    History of Present Illness    Mary  A Higgins is a PMH of sick sinus syndrome, HTN, HLD, coronary  artery disease, and acute ST elevation myocardial infarction of inferior wall.  She is status post PPM placement.  Her PMH also includes COPD and tobacco use.  She presented to [facility] 04/09/2024.  She was noted to have inferior STEMI.  She reported developing chest pain earlier in the evening which radiated to her left arm and was associated with nausea and vomiting.  Her initial EKG showed ST elevation along the inferior leads and she underwent emergent cardiac catheterization.  Her cardiac catheterization showed thrombotic occlusion of her small-moderate caliber PDA.  Attempts were unsuccessful to cross PDA  due to distal location of the lesion and RCA was tortuous and calcified.  She was noted to have moderate nonobstructive disease along her LAD and circumflex.  PAD was filling from left to right collaterals.  Medical management was recommended.  She was started on aspirin  and Plavix  with recommendations to continue for 12 months.  She was also started on atorvastatin  80 mg daily.  She noted that she was still having chest pain the following morning.  She was treated with morphine  and fentanyl .  Her echocardiogram showed LVEF of 55-50% with akinesis of the LV entire inferior lateral wall and inferior wall with akinesis of the apical inferior wall.  RV function was normal.  She was noted to have mild MR and aortic valve sclerosis without stenosis.  She was continued on losartan  50 mg twice daily.  She was noted to have hypotension 04/11/2024.  Her losartan  was reduced to 50 mg daily and she was restarted on metoprolol  succinate 25 mg daily.  She was seen and examined by Dr. Marven Slimmer on 04/12/2024.  She denied recurrent pain.  She was felt to be stable for discharge.  Follow-up BMP was recommended due to her hyponatremia.  She presents to the clinic today for follow-up evaluation and states***.  *** denies chest pain, shortness of breath, lower extremity edema, fatigue, palpitations, melena, hematuria, hemoptysis, diaphoresis, weakness, presyncope, syncope, orthopnea, and PND.  STEMI-denies chest pain since being discharged from the hospital.  Was noted to have inferior STEMI on 04/09/2024.  Several attempts were made to cross PDA.  Attempts were unsuccessful.  Medical management was recommended. Continue aspirin , atorvastatin , metoprolol , losartan  Heart healthy low-sodium high-fiber diet Increase physical activity as tolerated Ordered BMP  Hyperlipidemia-LDL***. Continue aspirin , atorvastatin  Repeat fasting lipids and LFTs in 4-6 weeks High-fiber diet  Essential hypertension-BP today***.  Was noted to  have episode of hypotension during hospital admission.  Her losartan  was decreased from twice daily to daily. Maintain blood pressure log Continue metoprolol , losartan   Tobacco abuse-strongly encouraged tobacco cessation. Smoking cessation information given  Disposition: Follow-up with Dr. Abel Hoe or me in 3-4 months.    Home Medications    Prior to Admission medications   Medication Sig Start Date End Date Taking? Authorizing Provider  aspirin  EC 81 MG tablet Take 1 tablet (81 mg total) by mouth daily. Swallow whole. 04/13/24   Strader, Dimple Francis, PA-C  atorvastatin  (LIPITOR) 80 MG tablet Take 1 tablet (80 mg total) by mouth daily. 04/13/24   Strader, Dimple Francis, PA-C  clonazePAM  (KLONOPIN ) 1 MG tablet Take 1 tablet (1 mg total) by mouth 3 (three) times daily. 02/15/24   Verma Gobble, NP  clopidogrel  (PLAVIX ) 75 MG tablet Take 1 tablet (75 mg total) by mouth daily with breakfast. 04/13/24   Finis Hugger, Dimple Francis, PA-C  gabapentin  (NEURONTIN ) 100 MG capsule Take 1 capsule (100 mg total) by mouth at bedtime. Patient taking differently:  Take 100 mg by mouth daily as needed (pain, neuropathy). 06/11/23   Merriam Abbey, DO  losartan  (COZAAR ) 50 MG tablet Take 1 tablet (50 mg total) by mouth daily. 04/12/24   Strader, Dimple Francis, PA-C  metoprolol  succinate (TOPROL -XL) 25 MG 24 hr tablet Take 1 tablet (25 mg total) by mouth daily. 04/13/24   Strader, Dimple Francis, PA-C  mirtazapine  (REMERON ) 15 MG tablet Take 1 tablet (15 mg total) by mouth at bedtime. 04/14/24   Arnetha Bhat, NP  Multiple Vitamins-Minerals (MULTIVITAMIN WOMEN 50+) TABS Take 1 tablet by mouth daily.    [provider]  nitroGLYCERIN  (NITROSTAT ) 0.4 MG SL tablet Place 1 tablet (0.4 mg total) under the tongue every 5 (five) minutes x 3 doses as needed for chest pain. 04/12/24   Strader, Dimple Francis, PA-C  pantoprazole  (PROTONIX ) 40 MG tablet TAKE 1 TABLET(40 MG) BY MOUTH DAILY Patient taking differently: Take 40 mg by mouth daily as  needed (heartburn). 08/17/23   Verma Gobble, NP    Family History    Family History  Problem Relation Age of Onset   Cancer Mother    Heart attack Father    Heart attack Son        Heart Pump placed    Diabetes Son    Hypertension Other    She indicated that her mother is deceased. She indicated that her father is deceased. She indicated that both of her brothers are alive. She indicated that her maternal grandmother is deceased. She indicated that her maternal grandfather is deceased. She indicated that her paternal grandmother is deceased. She indicated that her paternal grandfather is deceased. She indicated that her daughter is alive. She indicated that both of her sons are alive. She indicated that the status of her other is unknown.  Social History    Social History   Socioeconomic History   Marital status: Legally Separated    Spouse name: Not on file   Number of children: Not on file   Years of education: Not on file   Highest education level: Not on file  Occupational History   Not on file  Tobacco Use   Smoking status: Some Days    Types: Cigarettes    Start date: 01/12/2019   Smokeless tobacco: Never   Tobacco comments:    1 cig weekly sometimes will go longer than 1 week w/o smoking   Vaping Use   Vaping status: Never Used  Substance and Sexual Activity   Alcohol  use: No    Alcohol /week: 0.0 standard drinks of alcohol    Drug use: Never   Sexual activity: Not Currently  Other Topics Concern   Not on file  Social History Narrative   Not on file   Social Drivers of Health   Financial Resource Strain: Low Risk  (05/13/2018)   Overall Financial Resource Strain (CARDIA)    Difficulty of Paying Living Expenses: Not hard at all  Food Insecurity: No Food Insecurity (04/11/2024)   Hunger Vital Sign    Worried About Running Out of Food in the Last Year: Never true    Ran Out of Food in the Last Year: Never true  Transportation Needs: No Transportation Needs  (04/11/2024)   PRAPARE - Administrator, Civil Service (Medical): No    Lack of Transportation (Non-Medical): No  Physical Activity: Inactive (05/13/2018)   Exercise Vital Sign    Days of Exercise per Week: 0 days    Minutes of Exercise per  Session: 0 min  Stress: No Stress Concern Present (05/13/2018)   [employer] of Occupational Health - Occupational Stress Questionnaire    Feeling of Stress : Only a little  Social Connections: Moderately Isolated (04/11/2024)   Social Connection and Isolation Panel    Frequency of Communication with Friends and Family: Twice a week    Frequency of Social Gatherings with Friends and Family: More than three times a week    Attends Religious Services: Never    Database administrator or Organizations: Yes    Attends Banker Meetings: Never    Marital Status: Widowed  Intimate Partner Violence: Not At Risk (04/11/2024)   Humiliation, Afraid, Rape, and Kick questionnaire    Fear of Current or Ex-Partner: No    Emotionally Abused: No    Physically Abused: No    Sexually Abused: No     Review of Systems    General:  No chills, fever, night sweats or weight changes.  Cardiovascular:  No chest pain, dyspnea on exertion, edema, orthopnea, palpitations, paroxysmal nocturnal dyspnea. Dermatological: No rash, lesions/masses Respiratory: No cough, dyspnea Urologic: No hematuria, dysuria Abdominal:   No nausea, vomiting, diarrhea, bright red blood per rectum, melena, or hematemesis Neurologic:  No visual changes, wkns, changes in mental status. All other systems reviewed and are otherwise negative except as noted above.  Physical Exam    VS:  LMP  (LMP Unknown)  , BMI There is no height or weight on file to calculate BMI. GEN: Well nourished, well developed, in no acute distress. HEENT: normal. Neck: Supple, no JVD, carotid bruits, or masses. Cardiac: RRR, no murmurs, rubs, or gallops. No clubbing, cyanosis, edema.   Radials/DP/PT 2+ and equal bilaterally.  Respiratory:  Respirations regular and unlabored, clear to auscultation bilaterally. GI: Soft, nontender, nondistended, BS + x 4. MS: no deformity or atrophy. Skin: warm and dry, no rash. Neuro:  Strength and sensation are intact. Psych: Normal affect.  Accessory Clinical Findings    Recent Labs: 04/09/2024: ALT 9 04/12/2024: BUN 11; Creatinine, Ser 0.63; Hemoglobin 11.8; Platelets 242; Potassium 3.6; Sodium 128   Recent Lipid Panel    Component Value Date/Time   CHOL 137 04/09/2024 2336   CHOL 135 03/14/2016 1649   TRIG 68 04/09/2024 2336   HDL 42 04/09/2024 2336   HDL 46 03/14/2016 1649   CHOLHDL 3.3 04/09/2024 2336   VLDL 14 04/09/2024 2336   LDLCALC 81 04/09/2024 2336   LDLCALC 30 07/07/2020 1455    No BP recorded.  {Refresh Note OR Click here to enter BP  :1}***    ECG personally reviewed by me today- ***      Diagnostic Studies/Procedures    Cardiac Catheterization: 04/10/2024        Prox Cx to Mid Cx lesion is 50% stenosed.   2nd Mrg lesion is 50% stenosed.   Prox LAD to Dist LAD lesion is 30% stenosed.   Prox RCA lesion is 40% stenosed.   Mid RCA to Dist RCA lesion is 30% stenosed.   RPDA lesion is 100% stenosed.   Acute inferior STEMI secondary to thrombotic occlusion of the small to moderate caliber PDA Unsuccessful attempt to cross the PDA (unable to cross due to distal location of lesion. The RCA is tortuous and calcified) Moderate non-obstructive disease in the LAD and Circumflex LVEDP=25 mmHg   Recommendations: Will admit to the ICU. The PDA is closed and filling from left to right collaterals. Will not continue IV heparin . Will  continue ASA and Plavix . Continue high intensity statin. Echo tomorrow.      Echocardiogram: 04/10/2024 IMPRESSIONS     1. Left ventricular ejection fraction, by estimation, is 50 to 55%. The  left ventricle has low normal function. The left ventricle demonstrates  regional wall  motion abnormalities (see scoring diagram/findings for  description). There is mild left  ventricular hypertrophy of the basal-septal segment. Left ventricular  diastolic parameters are consistent with Grade I diastolic dysfunction  (impaired relaxation). Elevated left ventricular end-diastolic pressure.  There is akinesis of the left  ventricular, entire inferolateral wall and inferior wall. There is  akinesis of the left ventricular, apical inferior wall.   2. Right ventricular systolic function is normal. The right ventricular  size is normal. There is normal pulmonary artery systolic pressure. The  estimated right ventricular systolic pressure is 33.0 mmHg.   3. The mitral valve is degenerative. Mild mitral valve regurgitation. No  evidence of mitral stenosis. Moderate mitral annular calcification.   4. The aortic valve is tricuspid. Aortic valve regurgitation is not  visualized. Aortic valve sclerosis/calcification is present, without any  evidence of aortic stenosis. Aortic valve Vmax measures 1.16 m/s.   5. The inferior vena cava is dilated in size with >50% respiratory  variability, suggesting right atrial pressure of 8 mmHg.     Assessment & Plan   1.  ***   Chet Cota. Alfonso Shackett NP-C     04/24/2024, 1:13 PM [facility] [address] Office [phone] Fax [phone]    I spent***minutes examining this patient, reviewing medications, and using patient centered shared decision making involving their cardiac care.   I spent  20 minutes reviewing past medical history,  medications, and prior cardiac tests.

## 2024-04-25 ENCOUNTER — Encounter: Payer: Self-pay | Admitting: Cardiovascular Disease

## 2024-04-28 ENCOUNTER — Ambulatory Visit: Admitting: General Practice

## 2024-05-29 ENCOUNTER — Ambulatory Visit (INDEPENDENT_AMBULATORY_CARE_PROVIDER_SITE_OTHER): Payer: Medicare Other

## 2024-05-29 DIAGNOSIS — I495 Sick sinus syndrome: Secondary | ICD-10-CM

## 2024-05-29 LAB — CUP PACEART REMOTE DEVICE CHECK
Battery Voltage: 40
Date Time Interrogation Session: 20250717112755
Implantable Lead Connection Status: 753985
Implantable Lead Connection Status: 753985
Implantable Lead Implant Date: 20070830
Implantable Lead Implant Date: 20070830
Implantable Lead Location: 753859
Implantable Lead Location: 753860
Implantable Pulse Generator Implant Date: 20151113
Pulse Gen Model: 394931
Pulse Gen Serial Number: 68408118

## 2024-06-07 ENCOUNTER — Ambulatory Visit: Payer: Self-pay | Admitting: Cardiovascular Disease

## 2024-06-24 DIAGNOSIS — H353131 Nonexudative age-related macular degeneration, bilateral, early dry stage: Secondary | ICD-10-CM | POA: Diagnosis not present

## 2024-06-24 DIAGNOSIS — Z961 Presence of intraocular lens: Secondary | ICD-10-CM | POA: Diagnosis not present

## 2024-06-24 DIAGNOSIS — H26492 Other secondary cataract, left eye: Secondary | ICD-10-CM | POA: Diagnosis not present

## 2024-06-24 DIAGNOSIS — H26493 Other secondary cataract, bilateral: Secondary | ICD-10-CM | POA: Diagnosis not present

## 2024-06-24 DIAGNOSIS — H18413 Arcus senilis, bilateral: Secondary | ICD-10-CM | POA: Diagnosis not present

## 2024-06-30 NOTE — Progress Notes (Deleted)
 Cardiology Clinic Note   Patient Name: Mary  DELENA Higgins Date of Encounter: 06/30/2024  Primary Care Provider:  Caro Harlene POUR, NP Primary Cardiologist:  Lonni Cash, MD  Patient Profile    Mary  A Higgins 83 year old female presents to the clinic today for follow-up evaluation of her coronary artery disease.  Admitted 04/10/2024 and discharged 04/12/2024.  This was in the setting of STEMI.  Medical management was recommended.  Past Medical History    Past Medical History:  Diagnosis Date   Anxiety and depression    Hx of   Blepharospasm     Breast cancer (HCC) 1987   s/p lumpectomy.   Carotid bruit    Bilateral. Mild to moderate disease on LEFT and mild on the RIGHT in 2012   COPD (chronic obstructive pulmonary disease) (HCC)    mild. Not on inhalers.   Diabetes mellitus without complication (HCC)    AODM   Fuchs' corneal dystrophy    GERD (gastroesophageal reflux disease)     Headache    Hyperlipidemia    Hypertension     Insomnia     Intracerebral bleed (HCC) 2003   Remote intracerebral blled and coiling by Dr. Dolphus for cerebral aneurysm and a shunt placed by Dr. Unice remotely. No seizure disorder and this happened in 2003 with an anterior communicating aneurysm.   Meningioma (HCC)    left parafalcine calcified meningioma   Neurocardiogenic syncope    a. s/p Biotronik dual chamber pacemaker   Pacemaker    Personal history of radiation therapy    S/P VP shunt 2003   Sinus node dysfunction (HCC)     Tobacco abuse     Type II or unspecified type diabetes mellitus without mention of complication, not stated as uncontrolled     Past Surgical History:  Procedure Laterality Date   ANEURYSM COILING  11/13/2001   Cerebral aneurysm ;Dr. Thyra Dolphus   BREAST LUMPECTOMY Right    For cancer, no recurrence   CEREBRAL ANEURYSM REPAIR  2023   CHOLECYSTECTOMY     CORONARY/GRAFT ACUTE MI REVASCULARIZATION N/A 04/09/2024   Procedure:  Coronary/Graft Acute MI Revascularization;  Surgeon: Cash Lonni BIRCH, MD;  Location: [facility];  Service: Cardiovascular;  Laterality: N/A;   CRANIOTOMY Right 08/04/2021   Procedure: BURR HOLES FOR SUBDURAL HEMATOMA;  Surgeon: Cheryle Debby DELENA, MD;  Location: [facility] OR;  Service: Neurosurgery;  Laterality: Right;   IR ANGIO INTRA EXTRACRAN SEL INTERNAL CAROTID UNI R MOD SED  09/30/2021   IR ANGIO VERTEBRAL SEL VERTEBRAL UNI R MOD SED  09/30/2021   IR TRANSCATH/EMBOLIZ  09/30/2021   LEFT HEART CATH AND CORONARY ANGIOGRAPHY N/A 04/09/2024   Procedure: LEFT HEART CATH AND CORONARY ANGIOGRAPHY;  Surgeon: Cash Lonni BIRCH, MD;  Location: [facility];  Service: Cardiovascular;  Laterality: N/A;   PACEMAKER GENERATOR CHANGE N/A 09/25/2014   BTK dual chamber pacemkaer implanted by Dr Kelsie   PACEMAKER INSERTION  07/12/2006   [employer] Jude Victory XL DR  -  Dr. Herminio   PARTIAL HYSTERECTOMY     RADIOLOGY WITH ANESTHESIA N/A 09/30/2021   Procedure: Arteriogram, Onyx embolization of right middle meningeal artery;  Surgeon: Lanis Pupa, MD;  Location: [facility];  Service: Radiology;  Laterality: N/A;   VENTRICULOPERITONEAL SHUNT  11/13/2001   Dr. Fairy Unice    Allergies  Allergies  Allergen Reactions   Daypro [Oxaprozin] Rash    Pt does not recognize this medication    History of Present  Illness    Mary  A Higgins has a PMH of coronary artery disease, hypertension, hyperlipidemia, sick sinus syndrome status post PPM, COPD, and tobacco use.  She [facility] to [facility] on [facility]24.  She was diagnosed with inferior STEMI.  She reported she had developed chest pain earlier in the evening.  The pain radiated to her left arm.  It was associated with nausea and vomiting.  Her initial EKG showed ST elevation in the inferior leads.  She was taken for emergent cardiac catheterization.  She was noted to have a thrombotic occlusion of her small-moderate caliber  PDA.  Attempts were unsuccessful to cross PDA due to the distal location of the lesion.  Her RCA was noted to be torturous and calcified.  She was noted to have moderate nonobstructive disease through her circumflex and LAD.  Her PDA was filling left-right collaterally.  Medical management was recommended.  She was started on aspirin  and Plavix .  Recommendations were made to continue dual antiplatelet therapy for 1 year.  She was placed on atorvastatin  80 mg daily.  On exam the following morning she was still having chest pain.  She was treated with morphine  and fentanyl .  Her echocardiogram showed an LVEF of 50-55%.  Akinesis of the left ventricular and entire inferior lateral wall as well as the inferior wall.  Akinesis of the apical inferior wall was also noted.  RV function was normal.  She was noted to have mild MR and aortic valve sclerosis.  No aortic valve stenosis was noted.  She was continued on losartan .  Her metoprolol  was reduced.  She was restarted on metoprolol  succinate 25 mg daily.  She was seen by Dr. Cindie 04/12/2024.  She denied recurrent pain.  She was noted to have hyponatremia.  At follow-up BMI was recommended.  She presents to the clinic today for follow-up evaluation and states***.  *** denies chest pain, shortness of breath, lower extremity edema, fatigue, palpitations, melena, hematuria, hemoptysis, diaphoresis, weakness, presyncope, syncope, orthopnea, and PND.  STEMI-no chest pain today.  Underwent LHC in the setting of STEMI on 04/10/2024.  She was noted to have thrombotic occlusion of her PDA.  Lesion was unable to be crossed.  She was noted to have tortuous calcified RCA and moderate nonobstructive disease throughout her circumflex and LAD.  PDA was filling left-right collaterally.  Medical management was recommended. Heart healthy diet.   Increase physical activity as tolerated Continue aspirin , Plavix , atorvastatin , metoprolol , losartan  Repeat  BMP  Hyperlipidemia-LDL***. High-fiber diet Continue aspirin , atorvastatin  Repeat fasting lipids and LFTs  Essential hypertension-BP today***. Maintain blood pressure log Continue losartan , metoprolol    Sick sinus syndrome-she is status post PPM.  Device interrogated by Dr. Nancey on 05/29/2024.  She was noted to be a paced, V sensed.  Battery and leads are stable.  Device programming was felt to be appropriate.  No significant arrhythmias were noted. Following with EP  Disposition: Follow-up with Dr. Verlin or me in 3-4 months.  Home Medications    Prior to Admission medications   Medication Sig Start Date End Date Taking? Authorizing Provider  aspirin  EC 81 MG tablet Take 1 tablet (81 mg total) by mouth daily. Swallow whole. 04/13/24   Strader, Laymon HERO, PA-C  atorvastatin  (LIPITOR) 80 MG tablet Take 1 tablet (80 mg total) by mouth daily. 04/13/24   Strader, Laymon HERO, PA-C  clonazePAM  (KLONOPIN ) 1 MG tablet Take 1 tablet (1 mg total) by mouth 3 (three) times daily. 02/15/24   Caro Raisin  K, NP  clopidogrel  (PLAVIX ) 75 MG tablet Take 1 tablet (75 mg total) by mouth daily with breakfast. 04/13/24   Strader, Laymon HERO, PA-C  gabapentin  (NEURONTIN ) 100 MG capsule Take 1 capsule (100 mg total) by mouth at bedtime. Patient taking differently: Take 100 mg by mouth daily as needed (pain, neuropathy). 06/11/23   Skeet Juliene SAUNDERS, DO  losartan  (COZAAR ) 50 MG tablet Take 1 tablet (50 mg total) by mouth daily. 04/12/24   Strader, Laymon HERO, PA-C  metoprolol  succinate (TOPROL -XL) 25 MG 24 hr tablet Take 1 tablet (25 mg total) by mouth daily. 04/13/24   Strader, Laymon HERO, PA-C  mirtazapine  (REMERON ) 15 MG tablet Take 1 tablet (15 mg total) by mouth at bedtime. 04/14/24   Gil Greig BRAVO, NP  Multiple Vitamins-Minerals (MULTIVITAMIN WOMEN 50+) TABS Take 1 tablet by mouth daily.    [provider]  nitroGLYCERIN  (NITROSTAT ) 0.4 MG SL tablet Place 1 tablet (0.4 mg total) under the tongue every 5  (five) minutes x 3 doses as needed for chest pain. 04/12/24   Strader, Laymon HERO, PA-C  pantoprazole  (PROTONIX ) 40 MG tablet TAKE 1 TABLET(40 MG) BY MOUTH DAILY Patient taking differently: Take 40 mg by mouth daily as needed (heartburn). 08/17/23   Caro Harlene POUR, NP    Family History    Family History  Problem Relation Age of Onset   Cancer Mother    Heart attack Father    Heart attack Son        Heart Pump placed    Diabetes Son    Hypertension Other    She indicated that her mother is deceased. She indicated that her father is deceased. She indicated that both of her brothers are alive. She indicated that her maternal grandmother is deceased. She indicated that her maternal grandfather is deceased. She indicated that her paternal grandmother is deceased. She indicated that her paternal grandfather is deceased. She indicated that her daughter is alive. She indicated that both of her sons are alive. She indicated that the status of her other is unknown.  Social History    Social History   Socioeconomic History   Marital status: Legally Separated    Spouse name: Not on file   Number of children: Not on file   Years of education: Not on file   Highest education level: Not on file  Occupational History   Not on file  Tobacco Use   Smoking status: Some Days    Types: Cigarettes    Start date: 01/12/2019   Smokeless tobacco: Never   Tobacco comments:    1 cig weekly sometimes will go longer than 1 week w/o smoking   Vaping Use   Vaping status: Never Used  Substance and Sexual Activity   Alcohol  use: No    Alcohol /week: 0.0 standard drinks of alcohol    Drug use: Never   Sexual activity: Not Currently  Other Topics Concern   Not on file  Social History Narrative   Not on file   Social Drivers of Health   Financial Resource Strain: Low Risk  (05/13/2018)   Overall Financial Resource Strain (CARDIA)    Difficulty of Paying Living Expenses: Not hard at all  Food  Insecurity: No Food Insecurity (04/11/2024)   Hunger Vital Sign    Worried About Running Out of Food in the Last Year: Never true    Ran Out of Food in the Last Year: Never true  Transportation Needs: No Transportation Needs (04/11/2024)   PRAPARE -  Administrator, Civil Service (Medical): No    Lack of Transportation (Non-Medical): No  Physical Activity: Inactive (05/13/2018)   Exercise Vital Sign    Days of Exercise per Week: 0 days    Minutes of Exercise per Session: 0 min  Stress: No Stress Concern Present (05/13/2018)   [employer] of Occupational Health - Occupational Stress Questionnaire    Feeling of Stress : Only a little  Social Connections: Moderately Isolated (04/11/2024)   Social Connection and Isolation Panel    Frequency of Communication with Friends and Family: Twice a week    Frequency of Social Gatherings with Friends and Family: More than three times a week    Attends Religious Services: Never    Database administrator or Organizations: Yes    Attends Banker Meetings: Never    Marital Status: Widowed  Intimate Partner Violence: Not At Risk (04/11/2024)   Humiliation, Afraid, Rape, and Kick questionnaire    Fear of Current or Ex-Partner: No    Emotionally Abused: No    Physically Abused: No    Sexually Abused: No     Review of Systems    General:  No chills, fever, night sweats or weight changes.  Cardiovascular:  No chest pain, dyspnea on exertion, edema, orthopnea, palpitations, paroxysmal nocturnal dyspnea. Dermatological: No rash, lesions/masses Respiratory: No cough, dyspnea Urologic: No hematuria, dysuria Abdominal:   No nausea, vomiting, diarrhea, bright red blood per rectum, melena, or hematemesis Neurologic:  No visual changes, wkns, changes in mental status. All other systems reviewed and are otherwise negative except as noted above.  Physical Exam    VS:  LMP  (LMP Unknown)  , BMI There is no height or weight on file to  calculate BMI. GEN: Well nourished, well developed, in no acute distress. HEENT: normal. Neck: Supple, no JVD, carotid bruits, or masses. Cardiac: RRR, no murmurs, rubs, or gallops. No clubbing, cyanosis, edema.  Radials/DP/PT 2+ and equal bilaterally.  Respiratory:  Respirations regular and unlabored, clear to auscultation bilaterally. GI: Soft, nontender, nondistended, BS + x 4. MS: no deformity or atrophy. Skin: warm and dry, no rash. Neuro:  Strength and sensation are intact. Psych: Normal affect.  Accessory Clinical Findings    Recent Labs: 04/09/2024: ALT 9 04/12/2024: BUN 11; Creatinine, Ser 0.63; Hemoglobin 11.8; Platelets 242; Potassium 3.6; Sodium 128   Recent Lipid Panel    Component Value Date/Time   CHOL 137 04/09/2024 2336   CHOL 135 03/14/2016 1649   TRIG 68 04/09/2024 2336   HDL 42 04/09/2024 2336   HDL 46 03/14/2016 1649   CHOLHDL 3.3 04/09/2024 2336   VLDL 14 04/09/2024 2336   LDLCALC 81 04/09/2024 2336   LDLCALC 30 07/07/2020 1455    No BP recorded.  {Refresh Note OR Click here to enter BP  :1}***    ECG personally reviewed by me today- ***     Cardiac catheterization 04/10/2024        Prox Cx to Mid Cx lesion is 50% stenosed.   2nd Mrg lesion is 50% stenosed.   Prox LAD to Dist LAD lesion is 30% stenosed.   Prox RCA lesion is 40% stenosed.   Mid RCA to Dist RCA lesion is 30% stenosed.   RPDA lesion is 100% stenosed.   Acute inferior STEMI secondary to thrombotic occlusion of the small to moderate caliber PDA Unsuccessful attempt to cross the PDA (unable to cross due to distal location of lesion. The RCA is  tortuous and calcified) Moderate non-obstructive disease in the LAD and Circumflex LVEDP=25 mmHg   Recommendations: Will admit to the ICU. The PDA is closed and filling from left to right collaterals. Will not continue IV heparin . Will continue ASA and Plavix . Continue high intensity statin. Echo tomorrow.      Echocardiogram:  04/10/2024 IMPRESSIONS     1. Left ventricular ejection fraction, by estimation, is 50 to 55%. The  left ventricle has low normal function. The left ventricle demonstrates  regional wall motion abnormalities (see scoring diagram/findings for  description). There is mild left  ventricular hypertrophy of the basal-septal segment. Left ventricular  diastolic parameters are consistent with Grade I diastolic dysfunction  (impaired relaxation). Elevated left ventricular end-diastolic pressure.  There is akinesis of the left  ventricular, entire inferolateral wall and inferior wall. There is  akinesis of the left ventricular, apical inferior wall.   2. Right ventricular systolic function is normal. The right ventricular  size is normal. There is normal pulmonary artery systolic pressure. The  estimated right ventricular systolic pressure is 33.0 mmHg.   3. The mitral valve is degenerative. Mild mitral valve regurgitation. No  evidence of mitral stenosis. Moderate mitral annular calcification.   4. The aortic valve is tricuspid. Aortic valve regurgitation is not  visualized. Aortic valve sclerosis/calcification is present, without any  evidence of aortic stenosis. Aortic valve Vmax measures 1.16 m/s.   5. The inferior vena cava is dilated in size with >50% respiratory  variability, suggesting right atrial pressure of 8 mmHg.      Assessment & Plan   1.  ***   Josefa HERO. Mary Riggsbee NP-C     06/30/2024, 7:41 AM [facility] [address] [place], [place] [address] Office [phone]      I spent***minutes examining this patient, reviewing medications, and using patient centered shared decision making involving their cardiac care.   I spent  20 minutes reviewing past medical history,  medications, and prior cardiac tests.

## 2024-07-02 ENCOUNTER — Ambulatory Visit: Attending: Cardiovascular Disease | Admitting: General Practice

## 2024-08-12 NOTE — Progress Notes (Signed)
 Mary  SANTINA Higgins                                          MRN: [id number]   08/12/2024   The [facility] Quality Team Specialist reviewed this patient medical record for the purposes of chart review for care gap closure. The following were reviewed: chart review for care gap closure-kidney health evaluation for diabetes:eGFR  and uACR.    [facility] Quality Team

## 2024-08-13 ENCOUNTER — Other Ambulatory Visit: Payer: Self-pay | Admitting: Nurse Practitioner

## 2024-08-13 DIAGNOSIS — G245 Blepharospasm: Secondary | ICD-10-CM

## 2024-08-13 NOTE — Telephone Encounter (Signed)
 Patients contracted is dated for April 4th 2025. Clonazepam  was last filled on 02/15/2024. Pended for review.

## 2024-08-19 NOTE — Progress Notes (Signed)
 Remote PPM Transmission

## 2024-08-28 ENCOUNTER — Ambulatory Visit

## 2024-08-28 DIAGNOSIS — I495 Sick sinus syndrome: Secondary | ICD-10-CM | POA: Diagnosis not present

## 2024-08-29 ENCOUNTER — Ambulatory Visit: Admitting: Nurse Practitioner

## 2024-08-29 LAB — CUP PACEART REMOTE DEVICE CHECK
Date Time Interrogation Session: 20251016101255
Implantable Lead Connection Status: 753985
Implantable Lead Connection Status: 753985
Implantable Lead Implant Date: 20070830
Implantable Lead Implant Date: 20070830
Implantable Lead Location: 753859
Implantable Lead Location: 753860
Implantable Pulse Generator Implant Date: 20151113
Pulse Gen Model: 394931
Pulse Gen Serial Number: 68408118

## 2024-09-03 NOTE — Progress Notes (Signed)
 Remote PPM Transmission

## 2024-09-08 ENCOUNTER — Ambulatory Visit: Payer: Self-pay | Admitting: Cardiovascular Disease

## 2024-09-24 ENCOUNTER — Other Ambulatory Visit (HOSPITAL_COMMUNITY): Payer: Self-pay

## 2024-10-06 ENCOUNTER — Ambulatory Visit: Payer: Self-pay | Admitting: Nurse Practitioner

## 2024-10-27 ENCOUNTER — Ambulatory Visit: Admitting: Nurse Practitioner

## 2024-11-12 ENCOUNTER — Encounter: Admitting: Nurse Practitioner

## 2024-11-12 NOTE — Progress Notes (Signed)
 This encounter was created in error - please disregard.

## 2024-11-27 ENCOUNTER — Ambulatory Visit (INDEPENDENT_AMBULATORY_CARE_PROVIDER_SITE_OTHER)

## 2024-11-27 DIAGNOSIS — I495 Sick sinus syndrome: Secondary | ICD-10-CM | POA: Diagnosis not present

## 2024-11-27 LAB — CUP PACEART REMOTE DEVICE CHECK
Battery Voltage: 35
Date Time Interrogation Session: 20260115093254
Implantable Lead Connection Status: 753985
Implantable Lead Connection Status: 753985
Implantable Lead Implant Date: 20070830
Implantable Lead Implant Date: 20070830
Implantable Lead Location: 753859
Implantable Lead Location: 753860
Implantable Pulse Generator Implant Date: 20151113
Pulse Gen Model: 394931
Pulse Gen Serial Number: 68408118

## 2024-11-28 ENCOUNTER — Ambulatory Visit: Payer: Self-pay | Admitting: Cardiovascular Disease

## 2024-12-04 NOTE — Progress Notes (Signed)
 Remote PPM Transmission

## 2025-02-26 ENCOUNTER — Ambulatory Visit

## 2025-03-27 ENCOUNTER — Ambulatory Visit: Payer: Self-pay | Admitting: Nurse Practitioner

## 2025-05-28 ENCOUNTER — Ambulatory Visit

## 2025-08-27 ENCOUNTER — Ambulatory Visit
